# Patient Record
Sex: Female | Born: 1950
Health system: Southern US, Community
[De-identification: ages and names within clinical notes are randomized; demographics above are authoritative.]

## PROBLEM LIST (undated history)

## (undated) DIAGNOSIS — I429 Cardiomyopathy, unspecified: Secondary | ICD-10-CM

## (undated) DIAGNOSIS — I2510 Atherosclerotic heart disease of native coronary artery without angina pectoris: Secondary | ICD-10-CM

## (undated) DIAGNOSIS — I080 Rheumatic disorders of both mitral and aortic valves: Secondary | ICD-10-CM

## (undated) DIAGNOSIS — I5022 Chronic systolic (congestive) heart failure: Secondary | ICD-10-CM

## (undated) DIAGNOSIS — E785 Hyperlipidemia, unspecified: Secondary | ICD-10-CM

## (undated) DIAGNOSIS — I10 Essential (primary) hypertension: Secondary | ICD-10-CM

## (undated) DIAGNOSIS — I079 Rheumatic tricuspid valve disease, unspecified: Secondary | ICD-10-CM

## (undated) DIAGNOSIS — N186 End stage renal disease: Secondary | ICD-10-CM

## (undated) HISTORY — DX: Rheumatic disorders of both mitral and aortic valves: I08.0

## (undated) HISTORY — DX: Rheumatic tricuspid valve disease, unspecified: I07.9

## (undated) HISTORY — DX: Atherosclerotic heart disease of native coronary artery without angina pectoris: I25.10

## (undated) HISTORY — DX: Cardiomyopathy, unspecified: I42.9

## (undated) HISTORY — DX: Hyperlipidemia, unspecified: E78.5

## (undated) HISTORY — DX: End stage renal disease: N18.6

## (undated) HISTORY — DX: Chronic systolic (congestive) heart failure: I50.22

## (undated) HISTORY — DX: Essential (primary) hypertension: I10

---

## 2000-11-19 HISTORY — PX: KIDNEY TRANSPLANT: SHX239

## 2002-11-19 HISTORY — PX: AV FISTULA PLACEMENT: SHX1204

## 2005-08-03 ENCOUNTER — Ambulatory Visit: Payer: Self-pay | Admitting: Cardiology

## 2005-11-20 ENCOUNTER — Ambulatory Visit: Payer: Self-pay | Admitting: Cardiology

## 2005-11-22 ENCOUNTER — Inpatient Hospital Stay (HOSPITAL_COMMUNITY): Admission: AD | Admit: 2005-11-22 | Discharge: 2005-11-23 | Payer: Self-pay | Admitting: *Deleted

## 2005-11-22 ENCOUNTER — Ambulatory Visit: Payer: Self-pay | Admitting: Cardiology

## 2005-11-28 ENCOUNTER — Ambulatory Visit: Payer: Self-pay | Admitting: Cardiology

## 2006-05-31 ENCOUNTER — Encounter: Payer: Self-pay | Admitting: Physician Assistant

## 2006-05-31 ENCOUNTER — Ambulatory Visit: Payer: Self-pay | Admitting: Cardiology

## 2007-02-04 ENCOUNTER — Ambulatory Visit: Payer: Self-pay | Admitting: Cardiology

## 2008-05-17 ENCOUNTER — Encounter: Payer: Self-pay | Admitting: Physician Assistant

## 2008-06-18 ENCOUNTER — Ambulatory Visit: Payer: Self-pay | Admitting: Cardiology

## 2008-06-18 ENCOUNTER — Encounter: Payer: Self-pay | Admitting: Physician Assistant

## 2008-06-24 ENCOUNTER — Ambulatory Visit: Payer: Self-pay | Admitting: Cardiology

## 2008-08-12 ENCOUNTER — Ambulatory Visit: Payer: Self-pay | Admitting: Cardiology

## 2009-08-18 DIAGNOSIS — I429 Cardiomyopathy, unspecified: Secondary | ICD-10-CM | POA: Insufficient documentation

## 2009-08-18 DIAGNOSIS — I2510 Atherosclerotic heart disease of native coronary artery without angina pectoris: Secondary | ICD-10-CM

## 2009-08-18 DIAGNOSIS — I10 Essential (primary) hypertension: Secondary | ICD-10-CM

## 2009-08-18 DIAGNOSIS — I5022 Chronic systolic (congestive) heart failure: Secondary | ICD-10-CM | POA: Insufficient documentation

## 2009-08-18 DIAGNOSIS — E785 Hyperlipidemia, unspecified: Secondary | ICD-10-CM

## 2009-08-18 DIAGNOSIS — I080 Rheumatic disorders of both mitral and aortic valves: Secondary | ICD-10-CM

## 2009-08-18 DIAGNOSIS — I079 Rheumatic tricuspid valve disease, unspecified: Secondary | ICD-10-CM | POA: Insufficient documentation

## 2010-06-06 ENCOUNTER — Encounter: Payer: Self-pay | Admitting: Physician Assistant

## 2010-06-07 ENCOUNTER — Ambulatory Visit: Payer: Self-pay | Admitting: Cardiology

## 2010-06-08 ENCOUNTER — Encounter: Payer: Self-pay | Admitting: Physician Assistant

## 2010-06-29 ENCOUNTER — Encounter: Payer: Self-pay | Admitting: Physician Assistant

## 2010-07-26 ENCOUNTER — Telehealth (INDEPENDENT_AMBULATORY_CARE_PROVIDER_SITE_OTHER): Payer: Self-pay | Admitting: *Deleted

## 2010-07-26 ENCOUNTER — Ambulatory Visit: Payer: Self-pay | Admitting: Physician Assistant

## 2010-07-26 DIAGNOSIS — N186 End stage renal disease: Secondary | ICD-10-CM | POA: Insufficient documentation

## 2010-07-26 DIAGNOSIS — Z992 Dependence on renal dialysis: Secondary | ICD-10-CM

## 2010-08-28 ENCOUNTER — Ambulatory Visit: Payer: Self-pay | Admitting: Cardiology

## 2010-11-19 HISTORY — PX: AV FISTULA PLACEMENT: SHX1204

## 2010-12-19 NOTE — Letter (Signed)
Summary: [facility] D/C DR. DHRUV VYAS  [facility] D/C DR. DHRUV VYAS   Imported By: Delfino Lovett 06/29/2010 11:43:17  _____________________________________________________________________  External Attachment:    Type:   Image     Comment:   External Document

## 2010-12-19 NOTE — Progress Notes (Signed)
Summary: MEDLIST UPDATE  Phone Note Call from Patient Call back at Home Phone [phone]   Caller: Patient Call For: nurse Summary of Call: patient called to update medlist Initial call taken by: Georgina Peer,  July 26, 2010 3:35 PM    New/Updated Medications: RENAGEL 800 MG TABS (SEVELAMER HCL) take 3 by mouth with meals and 2 with snacks SIMVASTATIN 40 MG TABS (SIMVASTATIN) Take 1 tablet by mouth once a day

## 2010-12-19 NOTE — Consult Note (Signed)
Summary: DR. Jethro Poling [place]   Imported By: Delfino Lovett 06/29/2010 11:40:38  _____________________________________________________________________  External Attachment:    Type:   Image     Comment:   External Document

## 2010-12-19 NOTE — Assessment & Plan Note (Signed)
Summary: r/s eph that no showed   Visit Type:  Follow-up Primary Provider:  Woody Seller   History of Present Illness: patient presents for post hospital followup, following recent referral to our team here at [facility], for evaluation of chest pain.  She ruled out for myocardial infarction with normal MB/troponins (chronically elevated total CPKs), and was referred for a dobutamine stress echocardiogram, which was normal.  She denies any recurrent chest pain.  Patient has been on Coumadin since 2009, at which time she had question of a right atrial/left atrial appendage density; however, a repeat study later that year showed no evidence of thrombus. the current study also indicated no evidence of thrombus, but did indicate presence of an atrial septal aneurysm, but no PFO.  Preventive Screening-Counseling & Management  Alcohol-Tobacco     Smoking Status: never  Current Medications (verified): 1)  Labetalol Hcl 200 Mg Tabs (Labetalol Hcl) .... Take 2 Tablet By Mouth Two Times A Day 2)  Isosorbide Mononitrate Cr 30 Mg Xr24h-Tab (Isosorbide Mononitrate) .... Take 1 Tablet By Mouth Once A Day 3)  Sensipar 30 Mg Tabs (Cinacalcet Hcl) .... Take 1 Tablet By Mouth Once A Day 4)  Lexapro 20 Mg Tabs (Escitalopram Oxalate) .... Take 1 Tablet By Mouth Once A Day 5)  Hydralazine Hcl 10 Mg Tabs (Hydralazine Hcl) .... Take 1 Tablet By Mouth Once A Day 6)  Cozaar 50 Mg Tabs (Losartan Potassium) .... Take 1 Tablet By Mouth Once A Day 7)  Aspir-Low 81 Mg Tbec (Aspirin) .... Take 1 Tablet By Mouth Once A Day 8)  Renagel 800 Mg Tabs (Sevelamer Hcl) .... Use As Directed 9)  Morphine Sulfate 15 Mg Tabs (Morphine Sulfate) .... Take 1 Tablet By Mouth Once A Day 10)  Nephro-Vite 0.8 Mg Tabs (B Complex-C-Folic Acid) .... Take 1 Tablet By Mouth Once A Day  Allergies (verified): No Known Drug Allergies  Comments:  Nurse/Medical Assistant: The patient is currently on medications but does not know the name  or dosage at this time. Instructed to contact our office with details. Will update medication list at that time.  Past History:  Past Medical History: Last updated: 08/18/2009 TRICUSPID REGURGITATION, SEVERE (ICD-397.0) MITRAL REGURGITATION, MODERATE (ICD-396.3) HYPERTENSION, UNSPECIFIED (ICD-401.9) HYPERLIPIDEMIA-MIXED (ICD-272.4) CAD, NATIVE VESSEL ([id number]) SYSTOLIC HEART FAILURE, CHRONIC (ICD-428.22) CARDIOMYOPATHY, SECONDARY (ICD-425.9) End-stage renal disease  Review of Systems       No fevers, chills, hemoptysis, dysphagia, melena, hematocheezia, hematuria, rash, claudication, orthopnea, pnd, pedal edema. All other systems negative.   Vital Signs:  Patient profile:   60 year old female Height:      64 inches Weight:      156 pounds BMI:     26.87 Pulse rate:   90 / minute BP sitting:   118 / 84  (left arm) Cuff size:   regular  Vitals Entered By: Georgina Peer (July 26, 2010 2:02 PM)  Nutrition Counseling: Patient's BMI is greater than 25 and therefore counseled on weight management options.  Physical Exam  Additional Exam:  GEN: 60 year old female, sitting upright, no distress HEENT: NCAT,PERRLA,EOMI NECK: palpable pulses, no bruits; no JVD; no TM LUNGS: CTA bilaterally HEART: RRR (S1S2); no significant murmurs; no rubs; no gallops ABD: soft, NT; intact BS EXT: intact distal pulses; no edema SKIN: warm, dry MUSC: no obvious deformity NEURO: A/O (x3)     Impression & Recommendations:  Problem # 1:  CARDIOMYOPATHY, SECONDARY (ICD-425.9)  no further workup indicated. Patient had a recent normal dobutamine stress echocardiogram,  for evaluation of chest pain, in the setting of normal MBs/troponins. Of note, she has history of chronically elevated CPKs.  Problem # 2:  COUMADIN THERAPY (ICD-V58.61)  recommendation is to discontinue Coumadin anticoagulation, given the absence of any obvious thrombus, per recent echocardiogram. She had been on it since  2009, followed by Dr. Woody Seller. Of note, a followup echocardiogram later that same year indicated no obvious thrombus, as well.  Problem # 3:  END STAGE RENAL DISEASE (ICD-585.6)  on hemodialysis, followed by Dr. Lowanda Foster.  Problem # 4:  HYPERTENSION, UNSPECIFIED (ICD-401.9)  stable on current medication regimen.  Patient Instructions: 1)  Stop Coumadin. 2)  Your physician wants you to follow-up in: 1 year. You will receive a reminder letter in the mail one-two months in advance. If you don't receive a letter, please call our office to schedule the follow-up appointment.

## 2010-12-19 NOTE — Letter (Signed)
Summary: Appointment -missed  [facility] [place]. [address], [place] [address]   Phone: [phone]  Fax: [phone]     June 29, 2010 MRN: [id number]     [facility] [address] [place], [place]  [address]     Dear Ms. Tomasello,  Our records indicate you missed your appointment on June 29, 2010                        with [facility].   It is very important that we reach you to reschedule this appointment. We look forward to participating in your health care needs.   Please contact us at the number listed above at your earliest convenience to reschedule this appointment.   Sincerely,    Public relations account executive

## 2010-12-19 NOTE — Consult Note (Signed)
Summary: CARDIOLOGY CONSULT/ [facility]   Imported By: Delfino Lovett 06/29/2010 11:37:08  _____________________________________________________________________  External Attachment:    Type:   Image     Comment:   External Document

## 2011-02-20 ENCOUNTER — Ambulatory Visit (INDEPENDENT_AMBULATORY_CARE_PROVIDER_SITE_OTHER): Payer: Self-pay | Admitting: Internal Medicine

## 2011-04-03 NOTE — Assessment & Plan Note (Signed)
[facility]                          [facility]   NAME:Pryde, [name]                         MRN:          [id number]  DATE:06/18/2008                            DOB:          1951-04-05    CARDIOLOGIST:  Ernestine Mcmurray, MD,FACC   PRIMARY CARE PHYSICIAN:  Jerene Bears, MD   REASON FOR VISIT:  Abnormal echocardiogram.   HISTORY OF PRESENT ILLNESS:  [name] is a 60 year old female patient  with a history of nonischemic cardiomyopathy with an EF of about 30% as  well as  nonobstructive coronary artery disease by catheterization 2007  who presents to the office today for followup.  We last saw her in March  2008.  At that time, it was recommended that followup echocardiogram be  obtained.  The patient was to follow up 1 month later.  However, this  was never accomplished.  She did see Dr. Woody Seller in follow up in April 2009  and had an echocardiogram performed in their office.   Echocardiogram performed on March 15, 2008, revealed an EF of 20%,  biatrial enlargement, moderately severe tricuspid regurgitation with an  estimated right ventricular systolic pressure of 50 mmHg, moderate  mitral regurgitation and a question of an echo density in the right  atrium and left atrial appendage that was not optimally visualized.  It  was felt that the patient possibly had a clot and she was placed on  Coumadin therapy at that time.  Followup was requested and the patient  returns for follow up today.   The patient denies any history of chest pain.  She denies syncope, near-  syncope, or palpitations.  She notes NYHA class I symptoms.  She denies  orthopnea or PND.  She denies pedal edema.   CURRENT MEDICATIONS:  1. Labetalol 200 mg two tabs b.i.d.  2. Dialyvite.  3. Isosorbide 30 mg daily.  4. Sensipar 30 mg day.  5. Lipitor 20 mg daily.  6. Hydralazine 10 mg three times a day.  7. Cozaar 50 mg daily.  8. Aspirin 81 mg daily.  9. Renagel 80  mg.  10.Warfarin as directed by Dr. Woody Seller.  11.NephPlex daily.   ALLERGIES:  No known drug allergies.   PHYSICAL EXAMINATION:  GENERAL:  She is a well-nourished, well-developed  female.  VITAL SIGNS:  Blood pressure is 136/74, pulse 72, and weight 144 pounds.  HEENT:  Normal without JVD.  CARDIAC:  Normal S1 and S2.  Regular rate and rhythm with a [id number]  systolic ejection murmur heard best along the left sternal border.  LUNGS:  Clear to auscultation bilaterally without wheezing, rhonchi, or  rales.  ABDOMEN:  Soft, nontender.  EXTREMITIES:  Without edema.  NEUROLOGIC:  She is alert and oriented x3.  Cranial nerves II through  XII are grossly intact.   Electrocardiogram reveals sinus rhythm with a heart rate of 69, LVH with  repolarization abnormality.   IMPRESSION:  1. Nonischemic cardiomyopathy with an ejection fraction of 20-30%.  2. Chronic systolic congestive heart failure with a New York Heart  Association class I symptoms.  3. Nonobstructive coronary disease by catheterization in 2007.      a.     First diagonal branch ostial 50%, mid left anterior       descending 20%.  4. Questionable right atrial and left atrial appendage clot by 2D      echocardiogram done at [facility] - Coumadin therapy      initiated by Dr. Woody Seller.  5. Moderate mitral regurgitation.  6. Moderately severe tricuspid regurgitation.  7. End-stage renal disease on hemodialysis.      a.     The patient is being evaluated for transplantation at [facility].  8. Dyslipidemia.  9. Hypertension.  10.Pulmonary hypertension.   PLAN:  1. [name] returns for a followup after echocardiography revealed      potential clot in her right atrium and left atrial appendage.  She      has been placed on Coumadin therapy for this.  It has been      approximately 3 months since this study was performed.  At this      point in time, she requires followup echocardiography  for      reassessment.  I discussed this with Dr. Domenic Polite and he agreed.      If there is still a question on repeat transthoracic      echocardiography, she will require transesophageal echocardiography      to further assess.  2. Continued follow up for her Coumadin therapy will be per her      primary care physician.  3. The patient continues to demonstrate an EF less than 35%.  She has      a nonischemic cardiomyopathy.  We will eventually refer her to Dr.      Caryl Comes for consideration of ICD therapy.  We will hold off on making      this referral until we know the results of her repeat      echocardiogram.  4. The patient will be brought back in 4 weeks for followup on the      above testing and further recommendations.      Richardson Dopp, PA-C  Electronically Signed      Satira Sark, MD  Electronically Signed   SW/MedQ  DD: 06/18/2008  DT: 06/19/2008  Job #: [id number]   cc:   Alison Murray, M.D.  Jerene Bears, MD

## 2011-04-03 NOTE — Assessment & Plan Note (Signed)
[facility]                          [facility]   NAME:[name]                         MRN:          [id number]  DATE:08/12/2008                            DOB:          Oct 27, 1951    HISTORY OF PRESENT ILLNESS:  The patient is a 60 year old female with  nonischemic cardiomyopathy with the most recent ejection fraction of 20-  25%.  The patient has end-stage renal disease and is on hemodialysis.  She has had several shunt failures and is now being dialyzed through a  Vas Cath in the right subclavicular area.  She had a recent infection  with Staphylococcus lugdunensis for which she is still receiving  antibiotics therapy.  The echo was obtained with a followup  consideration for ICD implant.   She is having chest pain, but not short of breath.  Just feels shortly  after she just finished the day from hemodialysis.  She has, however, no  cardiovascular complaints.   She still is being followed by [facility] for possible kidney transplant.   PHYSICAL EXAMINATION:  VITAL SIGNS:  Blood pressure 165/95, heart rate  82, and weight 144 pounds.  NECK:  Reveal normal carotid upstroke and no carotid bruits.  LUNGS:  Clear breath sounds bilaterally.  HEART:  Regular rate and rhythm, normal S1 and S2.  No murmur, rub, or  gallop.  ABDOMEN:  Soft and nontender. No rebound or guarding.  Good bowel  sounds.  EXTREMITIES:  No cyanosis, clubbing or edema.  NEURO:  The patient is alert, oriented, and grossly normal.   MEDICATIONS:  1. Labetalol 200 mg 2 times p.o. b.i.d.  2. Isosorbide 30 mg p.o. daily.  3. Sensipar 30 mg p.o. daily.  4. Lexapro 20 mg p.o. daily.  5. Hydralazine 10 mg p.o. daily.  6. Cozaar 50 mg p.o. daily.  7. Aspirin 81 mg p.o. daily.  8. Renagel.  9. Morphine 5 mg p.o. daily.  10.Nephro__________ p.o. daily.   PROBLEMS:  1. Nonischemic cardiomyopathy with ejection fraction of 20-25%.  2. Systolic congestive heart  failure with NYHA class 1 symptoms.  3. Coronary artery disease.  4. History of right atrial and left atrial appendage thrombus, on      Coumadin.  5. Moderate mitral regurgitation.  6. Moderate severe tricuspid regurgitation.  7. End-stage renal disease, on hemodialysis.  8. Dyslipidemia.  9. Hypertension.   PLAN:  1. The patient really has had an infection with Staphylococcus      lugdunensis with gentamicin dialysis.  2. Given the fact that the patient has now a permanent Vas Cath in the      right subclavicular area, I do not think that she is a good      candidate for ICD implant as I think there is a high likelihood of      infection.  Furthermore, she has  nonischemic cardiomyopathy with      an NSTEMI, NYHA class 1, and the benefit may be marginal for ICD      implant.  3. We will follow up with the patient and  she will be continued to be      evaluated for renal transplant at [place]  Electronically Signed    GED/MedQ  DD: 08/12/2008  DT: 08/13/2008  Job #: [id number]

## 2011-04-06 NOTE — Cardiovascular Report (Signed)
[name], HRABOVSKY                  ACCOUNT NO.:  [id number]   MEDICAL RECORD NO.:  [id number]          PATIENT TYPE:  INP   LOCATION:  6525                         FACILITY:  [facility]   PHYSICIAN:  Ethelle Lyon, M.D. LHCDATE OF BIRTH:  21-Jun-1951   DATE OF PROCEDURE:  DATE OF DISCHARGE:                              CARDIAC CATHETERIZATION   Audio too short to transcribe (less than 5 seconds)      Ethelle Lyon, M.D. [facility]     WED/MEDQ  D:  11/22/2005  T:  11/22/2005  Job:  [id number]

## 2011-04-06 NOTE — Assessment & Plan Note (Signed)
[place] OFFICE NOTE   NAME:[name], [name]                         MRN:          [id number]  DATE:05/31/2006                            DOB:          23-Nov-1950    PRIMARY CARDIOLOGIST:  Ernestine Mcmurray, MD, [facility]   REASON FOR OFFICE VISIT:  Ms. Lovvorn presents for scheduled six month  followup here.   Patient is a 60 year old female with severe nonischemic cardiomyopathy,  status post cardiac catheterization in January of this year revealing  nonobstructive coronary artery disease and severe LVD (EF 30%) with global  hypokinesis.  Notably, there was no noted mitral regurgitation by  catheterization; however, by echocardiogram, patient had moderate MR  secondary to mitral annular dilatation versus papillary muscle dysfunction  (EF 25-30%) with global hypokinesis.   When last seen here in the clinic, our recommendations were to further up-  titrate medications with lisinopril to 20 daily and hydralazine to 20  t.i.d.; however, patient reports today that she was not given prescriptions  for these.   Regardless, patient reports having recently been taken off lisinopril  approximately one month ago secondary to complaint of cough.  She was placed  on Cozaar, and her cough has improved, although not yet resolved.   Symptomatically, patient continues to report shortness of breath, two pillow  orthopnea, and occasional PND as well as lower extremity edema; however, she  reports compliance with her medications and also refrains from any added  salt in her diet.   Patient does have end-stage renal disease and is on hemodialysis  (Tuesday/Thursday/Saturday).  She also reports close followup by Dr.  Lowanda Foster, who has been adjusting her blood pressure regimen.   CURRENT MEDICATIONS:  1.  Labetalol 400 mg b.i.d.  2.  Renagel 800 mg 4 tablets with meals.  3.  Dialyvite with zinc daily.  4.  Isosorbide 30 mg  daily.  5.  Sensipar 30 mg daily.  6.  Lipitor 20 mg daily.  7.  Hydralazine 10 mg t.i.d.  8.  Cozaar 50 mg daily.   PHYSICAL EXAMINATION:  VITAL SIGNS:  Blood pressure 150/90, pulse 66,  regular.  Weight 154 (down 4 pounds).  GENERAL:  A 60 year old female, sitting upright, in no apparent distress.  NECK:  Palpable carotid pulses with jugular venous pulsations noted at 90  degrees.  LUNGS:  Clear to auscultation bilaterally.  HEART:  Regular rate and rhythm.  (S1 and S2).  A harsh 3/6 holosystolic  murmur from the upper LSB to the left preaxillary line.  EXTREMITIES:  Palpable peripheral pulses with trace pedal edema.  NEUROLOGIC:  Flat affect but with no focal deficit.   IMPRESSION:  1.  Nonischemic cardiomyopathy.      1.  Nonobstructive coronary artery disease, ejection fraction 30% by          catheterization, January, 2007.  2.  Mitral regurgitation.      1.  Moderate by echocardiogram, January, 2007.  3.  Hypertension.  4.  End-stage renal disease.      1.  On  hemodialysis.  5.  Hyperlipidemia.   PLAN:  1.  Scheduled 2D echocardiogram for reassessment of LV function and severity      of mitral regurgitation.  If patient continues to have depressed left      ventricular function, then we will need to consider further aggressive      intervention (i.e., ICD implantation).  2.  Up-titrate hydralazine to 20 mg t.i.d. for better blood pressure      control.  3.  Schedule return visit to the clinic with Dr. Terald Sleeper in approximately      six months.                                   Gene Serpe, PA-C                                Ernestine Mcmurray, MD, [facility]   GS/MedQ  DD:  05/31/2006  DT:  05/31/2006  Job #:  [id number]   cc:   Alison Murray, MD

## 2011-04-06 NOTE — Cardiovascular Report (Signed)
NAMEANAM, [name]                  ACCOUNT NO.:  [id number]   MEDICAL RECORD NO.:  [id number]          PATIENT TYPE:  INP   LOCATION:  6525                         FACILITY:  [facility]   PHYSICIAN:  Ethelle Lyon, M.D. LHCDATE OF BIRTH:  [birth year]   DATE OF PROCEDURE:  DATE OF DISCHARGE:                              CARDIAC CATHETERIZATION   Audio too short to transcribe (less than 5 seconds)      Ethelle Lyon, M.D. [facility]     WED/MEDQ  D:  11/22/2005  T:  11/22/2005  Job:  [id number]

## 2011-04-06 NOTE — Cardiovascular Report (Signed)
NAMESHELANDA, WEASE                  ACCOUNT NO.:  [id number]   MEDICAL RECORD NO.:  [id number]          PATIENT TYPE:  INP   LOCATION:  6525                         FACILITY:  [place]   PHYSICIAN:  Ethelle Lyon, M.D. LHCDATE OF BIRTH:  Dec 22, 1950   DATE OF PROCEDURE:  11/22/2005  DATE OF DISCHARGE:                              CARDIAC CATHETERIZATION   PROCEDURE:  Left heart catheterization, left ventriculography, coronary  angiography.   INDICATIONS:  [name] is a 60 year old woman with end-stage renal disease  on chronic hemodialysis. She presented yesterday with congestive heart  failure despite compliance with her dialysis regimen and medications.  Troponins were mildly elevated 0.22. She was referred for diagnostic  angiography to exclude coronary disease as the etiology for heart failure.   PROCEDURE TECHNIQUE:  Informed consent was obtained. Under 1% lidocaine  local anesthesia, a 5-French sheath was placed in the right common femoral  artery using the modified Seldinger technique. Diagnostic angiography and  ventriculography were performed using JL-4, JR-4 and pigtail catheters. The  patient tolerated the procedure well and was transferred to the holding room  in stable condition. Sheath will be removed there.   COMPLICATIONS:  None.   FINDINGS:  1.  LV: 114/8/19. EF 30% with global hypokinesis.  2.  No aortic stenosis or mitral regurgitation.  3.  Left main:  There is some moderate calcification with mild plaquing but      no significant stenosis.  4.  LAD:  Relatively large vessel which wraps the apex of the heart and      gives rise to three diagonals. The first diagonal is an ostial 50%      stenosis. The mid-LAD has a 20% stenosis.  5.  Circumflex:  Relatively small vessel giving rise to a single branching      obtuse marginal. It is angiographically normal.  6.  RCA:  Relatively large dominant vessel. It is angiographically normal.   IMPRESSION/PLAN:  Ms.  [name] has a nonischemic cardiomyopathy, probably due  to her hypertension. She also has some superimposed mild coronary disease  which we will manage medically. We will use aspirin. We will discontinue  Norvasc in favor of lisinopril 10 milligrams per day. Continue labetalol,  hydralazine. I have spoken with Dr. Lowanda Foster who is making arrangements for  her to receive outpatient dialysis tomorrow at 10 a.m. in [place].      Ethelle Lyon, M.D. [facility]  Electronically Signed     WED/MEDQ  D:  11/22/2005  T:  11/22/2005  Job:  [id number]   cc:   Ernestine Mcmurray, M.D. [facility]  [address]  [address]  [place]  [place] [address]   Zollie Beckers, M.D.   Alison Murray, M.D.  Fax: [phone]

## 2011-04-06 NOTE — Assessment & Plan Note (Signed)
[facility]                          [facility] OFFICE NOTE   NAME:[name], [name]                         MRN:          [id number]  DATE:02/04/2007                            DOB:          Nov 24, 1950    PRIMARY CARDIOLOGIST:  Ernestine Mcmurray, MD, [facility]   REASON FOR VISIT:  Six month followup.   Since last seen here in the clinic by me in July 2007, he reports having  been briefly hospitalized at [facility] in late February of this  year, reportedly for treatment of infected catheter site. I do not have  any of these hospital records but, according to the patient, she was  discharged with recommendation to continue intravenous antibiotics for  an additional week. These apparently were administered during her  scheduled hemodialysis sessions and have since been completed.   The patient has also had followup with Dr. Lowanda Foster and no recent  medication adjustments were made.   Clinically, the patient states that she was not diagnosed with  congestive heart failure during this past hospitalization; she denies  any orthopnea, PND, lower extremity edema, chest pain or tachy  palpitations. Of note, she has lost 11 pounds since her last visit and  she states that she has had diminished appetite.   CURRENT MEDICATIONS:  1. Labetalol 400 b.i.d.  2. Renagel as directed.  3. Dialyvite.  4. Isosorbide 30 daily.  5. Sensipar 30 daily.  6. Lipitor 20 daily.  7. Hydralazine 20 t.i.d.  8. Cozaar 50 daily.   PHYSICAL EXAMINATION:  Blood pressure 132/72, pulse 60 and regular,  weight is 147 (down 11 pounds).  In general, a 60 year old female, sitting upright in no distress.  HEENT: Normocephalic, atraumatic.  NECK: Palpable bilateral carotid pulses without bruits. Jugular venous  distention noted at 90-degrees.  LUNGS:  Clear to auscultation in all fields.  HEART: Regular rate and rhythm (S1, S2). A harsh grade 3/6 holosystolic  murmur heard loudest  at the apex.  ABDOMEN: Soft, nontender.  EXTREMITIES: Palpable pulses with no significant edema.  NEURO: No focal deficit.   IMPRESSION:  1. Non-ischemic cardiomyopathy.      a.     EF of 30% by catheterization in January of 2007.      b.     Question Takasubo cardiomyopathy by 2D echo January [id number].  2. Systolic heart failure.      a.     New York Heart Association Class I.  3. Valvular heart disease.      a.     Moderate MR secondary to mitral anular dilatation (question       papillary muscle dysfunction).  4. End-stage renal disease.      a.     Hemodialysis, followed by Dr. Lowanda Foster.  5. Moderate pulmonary hypertension.  6. Hyperlipidemia.  7. Hypertension.   PLAN:  1. As previously recommended, the patient will need a repeat 2D echo      for reassessment of LV function and severity of mitral      regurgitation. As we had noted at time of her last  clinic visit,      the patient may need to be considered for further aggressive      intervention (ie-ICD implantation) if she has persistent depressed      left ventricular function. We will therefore reassess this when she      returns for clinic followup in one month for review of the echo      results and further recommendations.  2. The patient advised to start taking low-dose aspirin for primary      prevention.      Gene Serpe, PA-C  Electronically Signed      Ernestine Mcmurray, MD,FACC  Electronically Signed   GS/MedQ  DD: 02/04/2007  DT: 02/04/2007  Job #: [id number]   cc:   Alison Murray, M.D.

## 2011-04-06 NOTE — Discharge Summary (Signed)
[name], [name]                  ACCOUNT NO.:  [id number]   MEDICAL RECORD NO.:  [id number]          PATIENT TYPE:  INP   LOCATION:  [id number]                         FACILITY:  [facility]   PHYSICIAN:  Ethelle Lyon, M.D. LHCDATE OF BIRTH:  March 01, 1951   DATE OF ADMISSION:  11/22/2005  DATE OF DISCHARGE:  11/23/2005                                 DISCHARGE SUMMARY   PRIMARY CARDIOLOGIST:  Ernestine Mcmurray, M.D. [facility], [place], [place].   PRIMARY CARE PHYSICIAN:  Langley Gauss, M.D., [place], [place].   PRINCIPAL DIAGNOSIS:  Hypertensive emergency/urgency.   SECONDARY DIAGNOSES:  1.  Congestive heart failure.  2.  Nonischemic cardiomyopathy with ejection fraction of 30%.  3.  Nonobstructive coronary artery disease.  4.  Poorly controlled hypertension.  5.  End-stage renal disease on Monday, Wednesday, Friday dialysis since      1998.  6.  Status post failed renal transplantation.  7.  Hyperlipidemia.   ALLERGIES:  NO KNOWN DRUG ALLERGIES.   PROCEDURE:  Left heart cardiac catheterization.   HISTORY OF PRESENT ILLNESS:  The patient is a 60 year old African American  female with prior history of hypertension, hypertensive nephropathy, and end-  stage renal disease on hemodialysis since 1998 who presented to the [facility] emergency room on November 20, 2005 with complaints of  nonproductive cough, progressive over the past few weeks, with acute onset  of shortness of breath and orthopnea.  In the emergency room, she was noted  to have a blood pressure of 211/122 with an oxygen saturation of  approximately 80% on room air.  Chest x-ray showed small bilateral pleural  effusions with venous congestion and interstitial edema.  She was initiated  on Lasix and Rocephin therapy, and renal was consulted for urgent  hemodialysis.  Cardiology was consulted secondary to elevated CK-MB of 12.6  and troponin I of 0.22.  EKG showed sinus tachycardia with nonspecific ST  changes.  Her white count was noted to be elevated at 17,300.  Decision was  made to transfer her to [facility] for further evaluation and cardiac  catheterization.   HOSPITAL COURSE:  The patient underwent left heart cardiac catheterization  on November 22, 2005 revealing an EF of 30% with global hypokinesis, normal  left main, a 20% stenosis in the mid-LAD, a 50% lesion in the proximal V1, a  normal left circumflex, and normal right coronary artery.  We saw marked  improvement in blood pressures with the addition of hydralazine and nitrate  along with lisinopril therapy.  This morning, she has been ambulating  without any recurrent dyspnea or limitation.  She is being discharged home  today and has scheduled dialysis this morning in [place].   DISCHARGE LABORATORY DATA:  Hemoglobin 9.9, hematocrit 28.0, WBC 9.5,  platelets 318, MCV 94.6.  Sodium 132, potassium 4.5, chloride 96, CO2 23,  BUN 46, creatinine 16.0, glucose 92, calcium 8.5.   DISPOSITION:  The patient is being discharged home today in good condition.   FOLLOW UP:  The patient has a followup appointment scheduled with Dr. Dannielle Burn  on November 28, 2005 at 1:45 p.m.  She will continue on Monday, Wednesday,  Friday dialysis and is also asked to follow up with her primary care  physician, Dr. Ronne Binning in [place], within the next three or four weeks.   DISCHARGE MEDICATIONS:  1.  Lisinopril 10 mg daily.  2.  Labetalol 400 mg b.i.d.  3.  Hydralazine 10 mg t.i.d.  4.  Isosorbide mononitrate 30 mg daily.  5.  Lipitor 20 mg daily.  6.  Aspirin 81 mg daily.  7.  Multivitamin plus zinc daily.  8.  Renagel 800 mg t.i.d. q.a.c. and two tablets with snacks.  9.  Sensipar 30 mg t.i.d. q.a.c.  10. Zithromax 250 mg daily x5 days.   OUTSTANDING LAB STUDIES:  None.   DURATION OF DISCHARGE ENCOUNTER:  35 minutes, including physician time.      Rogelia Mire, NP      Ethelle Lyon, M.D. [facility]  Electronically Signed     CRB/MEDQ  D:  11/23/2005  T:  11/23/2005  Job:  [id number]   cc:   Langley Gauss, M.D.   Ernestine Mcmurray, M.D. [facility]  [address] [place]  [place] [address]

## 2011-04-11 ENCOUNTER — Ambulatory Visit (INDEPENDENT_AMBULATORY_CARE_PROVIDER_SITE_OTHER): Payer: BC Managed Care – PPO | Admitting: Internal Medicine

## 2011-04-11 DIAGNOSIS — Z7982 Long term (current) use of aspirin: Secondary | ICD-10-CM

## 2011-04-11 DIAGNOSIS — R634 Abnormal weight loss: Secondary | ICD-10-CM

## 2011-04-30 NOTE — Consult Note (Signed)
  NAMETERRENA, GARCED                  ACCOUNT NO.:  [id number]  MEDICAL RECORD NO.:  [id number]           PATIENT TYPE:  LOCATION:                                 FACILITY:  PHYSICIAN:  Hildred Laser, M.D.    DATE OF BIRTH:  [birth year]  DATE OF CONSULTATION: DATE OF DISCHARGE:                                CONSULTATION   ADDENDUM  She was seen on Apr 11, 2011.  Her last colonoscopy was on February 03, 1998, which revealed diverticulosis of the colon.  This procedure was done at [facility].    ______________________________ Deberah Castle, NP   ______________________________ Hildred Laser, M.D.    TS/MEDQ  D:  04/11/2011  T:  04/12/2011  Job:  [id number]  Electronically Signed by Deberah Castle PA on 04/19/2011 01:48:53 PM Electronically Signed by Hildred Laser M.D. on 04/30/2011 02:38:54 PM

## 2011-04-30 NOTE — Consult Note (Signed)
Brittany, [name]                  ACCOUNT NO.:  [id number]  MEDICAL RECORD NO.:  [id number]           PATIENT TYPE:  LOCATION:                                 FACILITY:  PHYSICIAN:  Hildred Laser, M.D.    DATE OF BIRTH:  1951/11/18  DATE OF CONSULTATION:  04/11/2011 DATE OF DISCHARGE:                                CONSULTATION   REFERRING PHYSICIAN:  Alison Murray, MD  REASON FOR CONSULTATION:  Referred by Dr. Lowanda Foster for a screening colonoscopy as part of her workup for a kidney transplant.  HISTORY OF PRESENT ILLNESS:  [name] is a 60 year old female presenting today as a referral from Dr. Lowanda Foster.  She is planning to have another kidney transplant.  She underwent a left kidney transplant in 2000.  She received a kidney from one of her sons.  The kidney failed in 2002 due to rejection.  As part of her workup for the transplant there, Dr. Lowanda Foster is requesting a screening colonoscopy.  She says her appetite has been okay.  She gives a history of having a weight loss up to 25 pounds over 2-3 years.  She denies any abdominal pain.  She usually has 1-2 bowel movements a day and they are normal.  They are brown in color.  She denies any melena or bright red rectal bleeding. She says her last colonoscopy was less than 5 years ago at [facility] and it was normal and I will try to get those records.  She does take dialysis on Tuesdays, Thursdays, and Saturdays. There are no known allergies.  SURGERIES:  She had a tubal ligation in 1981.  She has a dialysis port to her right arm.  She previously had a dialysis port in the left arm. She had a left kidney transplant in 2000, her kidneys failed in 2002. The kidney was donated by one of her sons.  The kidney transplant was performed by Dr. Nolon Lennert at [facility] There is a family history of polycystic kidney disease.  MEDICAL HISTORY:  Includes hypertension, high cholesterol,  renal failure, polycystic kidney disease.  She has mitral valve regurgitation. She underwent a 2D echo in 2009, which showed an ejection fraction of 20%.  She has nonischemic cardiomyopathy.  FAMILY HISTORY:  Mother deceased from kidney failure.  Father deceased from kidney failure, CVA, and he was on dialysis.  Two sisters are deceased both from kidney failure and CVA.  Four brothers, one is alive with hypertension, one deceased from a car accident.  One was accidentally shot and one died from kidney failure and there is a family history of polycystic kidney disease.  SOCIAL HISTORY:  She is separated.  She is unemployed.  She does not smoke, drink, or do drugs.  She has 3 children, one son who was presently on dialysis, one son has recently been diagnosed with diabetes, he also gave his mother a kidney, and one is in good health.  HOME MEDICATIONS: 1. Aspirin 81 mg a day. 2. Isosorbide 30 mg a day. 3. Nephplex 2 tabs daily. 4. Renvela 800 mg  3 tabs with meals and 2 with snacks. 5. Sensipar 30 mg 1 with every meal. 6. Simvastatin 40 mg a day. 7. Tylenol over the counter.  The patient did bring a list with her and I have reviewed this list. She denies taking any other medicine.  SUBJECTIVE:  VITAL SIGNS:  Her weight is 156.5, height 5 feet 4 inches, temperature 97.8, blood pressure 124/72, pulse 76. HEENT:  She has natural teeth.  Her oral mucosa is moist.  Her conjunctivae are pink.  Her sclerae are anicteric. NECK:  Her thyroid is normal.  There is no cervical lymphadenopathy. LUNGS:  Clear. HEART:  Regular rate and rhythm. ABDOMEN:  Soft, obese.  No masses.  She does have a scar to her left side of her abdomen, very long from the transplant.  Her stool was brown guaiac negative today.  ASSESSMENT:  [name] is a 60 year old female with chronic renal failure.  She is presently awaiting a kidney transplant.  As part of a workup for her transplant, Dr. Lowanda Foster is  requesting a screening colonoscopy.  She has had an unintentional weight loss of approximately 25 pounds over the last 2-3 years.  A colon neoplasm also needs to be ruled out.  RECOMMENDATIONS:  We will schedule a colonoscopy  in the near future.   The risk and benefits were reviewed with her and she is agreeable.    ______________________________ Deberah Castle, NP   ______________________________ Hildred Laser, M.D.    TS/MEDQ  D:  04/11/2011  T:  04/11/2011  Job:  [id number]  cc:   Alison Murray, M.D. Fax: [phone]  Electronically Signed by Deberah Castle PA on 04/13/2011 09:49:18 AM Electronically Signed by Hildred Laser M.D. on 04/30/2011 02:38:42 PM

## 2011-05-16 ENCOUNTER — Ambulatory Visit (HOSPITAL_COMMUNITY)
Admission: RE | Admit: 2011-05-16 | Discharge: 2011-05-16 | Disposition: A | Discharge status: Home / Self Care | Payer: BC Managed Care – PPO | Source: Ambulatory Visit | Attending: Internal Medicine | Admitting: Internal Medicine

## 2011-05-16 ENCOUNTER — Encounter (HOSPITAL_BASED_OUTPATIENT_CLINIC_OR_DEPARTMENT_OTHER): Payer: PRIVATE HEALTH INSURANCE | Admitting: Internal Medicine

## 2011-05-16 DIAGNOSIS — Z1211 Encounter for screening for malignant neoplasm of colon: Secondary | ICD-10-CM

## 2011-05-16 DIAGNOSIS — N186 End stage renal disease: Secondary | ICD-10-CM | POA: Insufficient documentation

## 2011-05-16 DIAGNOSIS — K648 Other hemorrhoids: Secondary | ICD-10-CM | POA: Insufficient documentation

## 2011-05-16 DIAGNOSIS — K644 Residual hemorrhoidal skin tags: Secondary | ICD-10-CM

## 2011-05-16 DIAGNOSIS — Z9689 Presence of other specified functional implants: Secondary | ICD-10-CM | POA: Insufficient documentation

## 2011-05-16 DIAGNOSIS — K5730 Diverticulosis of large intestine without perforation or abscess without bleeding: Secondary | ICD-10-CM | POA: Insufficient documentation

## 2011-05-29 NOTE — Op Note (Signed)
  Brittany, [name]                  ACCOUNT NO.:  [id number]  MEDICAL RECORD NO.:  [id number]  LOCATION:  [facility]                          FACILITY:  [facility]  PHYSICIAN:  Hildred Laser, M.D.    DATE OF BIRTH:  1951/06/01  DATE OF PROCEDURE: DATE OF DISCHARGE:                              OPERATIVE REPORT   PROCEDURE:  Colonoscopy.  INDICATION:  [name] is 60 year old African American female who is undergoing average risk screening colonoscopy.  She is waiting for renal transplant and her last colonoscopy was over 10 years ago.  Family history is negative for colorectal carcinoma.  Procedures were reviewed with the patient and informed consent was obtained.  MEDS FOR CONSCIOUS SEDATION: 1. Demerol 25 mg IV. 2. Versed 5 mg IV.  FINDINGS:  Procedure performed in endoscopy suite.  The patient's vital signs and O2 sat were monitored during the procedure and remained stable.  The patient was placed left lateral recumbent position.  Rectal examination performed.  No abnormality noted on external or digital exam.  Pentax videoscope was placed in rectum and advanced under vision into sigmoid colon and beyond.  She had multiple diverticula primarily at sigmoid and descending colon and few more scattered in rest of her colon.  Scope was passed into cecum which was identified by appendiceal orifice and ileocecal valve.  Pictures taken for the record.  As the scope was withdrawn, colonic mucosa was carefully examined and no polyps and/or tumor masses were noted.  Rectal mucosa similarly was normal. Scope was retroflexed to examine anorectal junction and small hemorrhoids noted below the dentate line.  Endoscope was withdrawn. Withdrawal time was 9 minutes.  The patient tolerated the procedure well.  FINAL DIAGNOSES: 1. Examination performed to cecum. 2. Pancolonic diverticulosis.  She has moderate number of diverticula     at sigmoid and descending colon and few more proximal splenic  flexure. 3. Small external hemorrhoids.  RECOMMENDATIONS: 1. Standard instructions given. 2. High-fiber diet.  She may consider next screening exam in 10 years.          ______________________________ Hildred Laser, M.D.     NR/MEDQ  D:  05/16/2011  T:  05/17/2011  Job:  [id number]  cc:   Alison Murray, M.D. [phone]  Dr. Woody Seller  Electronically Signed by Hildred Laser M.D. on 05/29/2011 09:50:48 PM

## 2011-10-17 DIAGNOSIS — D649 Anemia, unspecified: Secondary | ICD-10-CM | POA: Insufficient documentation

## 2011-10-17 DIAGNOSIS — Q613 Polycystic kidney, unspecified: Secondary | ICD-10-CM | POA: Insufficient documentation

## 2011-11-26 ENCOUNTER — Ambulatory Visit: Payer: Self-pay | Admitting: Vascular Surgery

## 2011-11-28 ENCOUNTER — Ambulatory Visit: Payer: Self-pay | Admitting: Vascular Surgery

## 2011-12-27 ENCOUNTER — Telehealth (INDEPENDENT_AMBULATORY_CARE_PROVIDER_SITE_OTHER): Payer: Self-pay | Admitting: *Deleted

## 2011-12-27 NOTE — Telephone Encounter (Signed)
Andreanna from [facility] needs a copy of Sharde's TCS. The return phone number is [phone] and fax number is [phone].

## 2011-12-27 NOTE — Telephone Encounter (Signed)
Note faxed to [facility]

## 2012-01-08 ENCOUNTER — Telehealth: Payer: Self-pay | Admitting: Cardiology

## 2012-01-08 NOTE — Telephone Encounter (Signed)
06/20/11 & 10/17/11  mailed reminder to schedule f/u

## 2012-01-30 ENCOUNTER — Ambulatory Visit: Payer: Self-pay | Admitting: Vascular Surgery

## 2012-02-16 ENCOUNTER — Emergency Department: Payer: Self-pay | Admitting: Emergency Medicine

## 2012-05-05 ENCOUNTER — Ambulatory Visit: Payer: Self-pay | Admitting: Vascular Surgery

## 2012-05-20 ENCOUNTER — Encounter: Payer: Self-pay | Admitting: Cardiology

## 2012-07-28 ENCOUNTER — Ambulatory Visit: Payer: Self-pay | Admitting: Vascular Surgery

## 2012-09-15 ENCOUNTER — Ambulatory Visit: Payer: Self-pay | Admitting: Vascular Surgery

## 2012-11-03 ENCOUNTER — Ambulatory Visit: Payer: Self-pay | Admitting: Vascular Surgery

## 2012-12-15 ENCOUNTER — Ambulatory Visit: Payer: Self-pay | Admitting: Vascular Surgery

## 2012-12-24 ENCOUNTER — Ambulatory Visit: Payer: Self-pay | Admitting: Vascular Surgery

## 2013-01-06 ENCOUNTER — Ambulatory Visit: Payer: Self-pay | Admitting: Vascular Surgery

## 2013-01-07 ENCOUNTER — Encounter (HOSPITAL_COMMUNITY): Payer: Self-pay | Admitting: *Deleted

## 2013-01-07 ENCOUNTER — Encounter (HOSPITAL_COMMUNITY): Payer: Self-pay | Admitting: Pharmacist

## 2013-01-07 ENCOUNTER — Other Ambulatory Visit: Payer: Self-pay

## 2013-01-07 NOTE — Progress Notes (Signed)
No Answer, I left instructions on voice mail , arrive at 0830, NPO after mn.  Meds with a sip of water Labetolol, Tylenol if needed.  Phone number to SS.

## 2013-01-08 ENCOUNTER — Ambulatory Visit (HOSPITAL_COMMUNITY)
Admission: RE | Admit: 2013-01-08 | Discharge: 2013-01-08 | Disposition: A | Discharge status: Home / Self Care | Payer: Medicare Other | Source: Ambulatory Visit | Attending: Vascular Surgery | Admitting: Vascular Surgery

## 2013-01-08 ENCOUNTER — Ambulatory Visit (HOSPITAL_COMMUNITY): Payer: Medicare Other | Admitting: Anesthesiology

## 2013-01-08 ENCOUNTER — Encounter (HOSPITAL_COMMUNITY): Payer: Self-pay | Admitting: *Deleted

## 2013-01-08 ENCOUNTER — Encounter (HOSPITAL_COMMUNITY): Payer: Self-pay | Admitting: Anesthesiology

## 2013-01-08 ENCOUNTER — Encounter (HOSPITAL_COMMUNITY)
Admission: RE | Disposition: A | Discharge status: Home / Self Care | Payer: Self-pay | Source: Ambulatory Visit | Attending: Vascular Surgery

## 2013-01-08 ENCOUNTER — Ambulatory Visit (HOSPITAL_COMMUNITY): Payer: Medicare Other

## 2013-01-08 DIAGNOSIS — E785 Hyperlipidemia, unspecified: Secondary | ICD-10-CM | POA: Insufficient documentation

## 2013-01-08 DIAGNOSIS — N186 End stage renal disease: Secondary | ICD-10-CM

## 2013-01-08 DIAGNOSIS — I120 Hypertensive chronic kidney disease with stage 5 chronic kidney disease or end stage renal disease: Secondary | ICD-10-CM | POA: Insufficient documentation

## 2013-01-08 DIAGNOSIS — Z992 Dependence on renal dialysis: Secondary | ICD-10-CM | POA: Insufficient documentation

## 2013-01-08 DIAGNOSIS — I2510 Atherosclerotic heart disease of native coronary artery without angina pectoris: Secondary | ICD-10-CM | POA: Insufficient documentation

## 2013-01-08 DIAGNOSIS — I5022 Chronic systolic (congestive) heart failure: Secondary | ICD-10-CM | POA: Insufficient documentation

## 2013-01-08 HISTORY — PX: INSERTION OF DIALYSIS CATHETER: SHX1324

## 2013-01-08 LAB — CBC
Hemoglobin: 7.2 g/dL — ABNORMAL LOW (ref 12.0–15.0)
MCV: 96 fL (ref 78.0–100.0)
Platelets: 183 10*3/uL (ref 150–400)
RBC: 2.24 MIL/uL — ABNORMAL LOW (ref 3.87–5.11)
WBC: 9.7 10*3/uL (ref 4.0–10.5)

## 2013-01-08 LAB — SURGICAL PCR SCREEN: MRSA, PCR: NEGATIVE

## 2013-01-08 LAB — POCT I-STAT 4, (NA,K, GLUC, HGB,HCT): Glucose, Bld: 89 mg/dL (ref 70–99)

## 2013-01-08 SURGERY — INSERTION OF DIALYSIS CATHETER
Anesthesia: Monitor Anesthesia Care | Site: Neck | Wound class: Clean

## 2013-01-08 MED ORDER — PROPOFOL 10 MG/ML IV BOLUS
INTRAVENOUS | Status: DC | PRN
  Administered 2013-01-08: 10 mg via INTRAVENOUS
  Administered 2013-01-08: 30 mg via INTRAVENOUS
  Administered 2013-01-08: 20 mg via INTRAVENOUS

## 2013-01-08 MED ORDER — HEPARIN SODIUM (PORCINE) 5000 UNIT/ML IJ SOLN
INTRAMUSCULAR | Status: DC | PRN
  Administered 2013-01-08: 15:00:00

## 2013-01-08 MED ORDER — OXYCODONE HCL 5 MG/5ML PO SOLN
5.0000 mg | Freq: Once | ORAL | Status: DC | PRN
Start: 2013-01-08 — End: 2013-01-08

## 2013-01-08 MED ORDER — HEPARIN SODIUM (PORCINE) 1000 UNIT/ML IJ SOLN
INTRAMUSCULAR | Status: AC
  Filled 2013-01-08: qty 1

## 2013-01-08 MED ORDER — SODIUM CHLORIDE 0.9 % IV SOLN
INTRAVENOUS | Status: DC | PRN
  Administered 2013-01-08: 15:00:00 via INTRAVENOUS

## 2013-01-08 MED ORDER — LIDOCAINE-EPINEPHRINE (PF) 1 %-1:200000 IJ SOLN
INTRAMUSCULAR | Status: DC | PRN
  Administered 2013-01-08: 30 mL

## 2013-01-08 MED ORDER — SODIUM CHLORIDE 0.9 % IV SOLN: INTRAVENOUS | Status: DC

## 2013-01-08 MED ORDER — 0.9 % SODIUM CHLORIDE (POUR BTL) OPTIME
TOPICAL | Status: DC | PRN
  Administered 2013-01-08: 900 mL

## 2013-01-08 MED ORDER — FENTANYL CITRATE 0.05 MG/ML IJ SOLN: 25.0000 ug | INTRAMUSCULAR | Status: DC | PRN

## 2013-01-08 MED ORDER — ONDANSETRON HCL 4 MG/2ML IJ SOLN: 4.0000 mg | Freq: Four times a day (QID) | INTRAMUSCULAR | Status: DC | PRN

## 2013-01-08 MED ORDER — HEPARIN SODIUM (PORCINE) 1000 UNIT/ML IJ SOLN
INTRAMUSCULAR | Status: DC | PRN
  Administered 2013-01-08: 10 mL

## 2013-01-08 MED ORDER — OXYCODONE-ACETAMINOPHEN 5-325 MG PO TABS: 1.0000 | ORAL_TABLET | ORAL | Status: DC | PRN

## 2013-01-08 MED ORDER — MUPIROCIN 2 % EX OINT
TOPICAL_OINTMENT | Freq: Two times a day (BID) | CUTANEOUS | Status: DC
  Administered 2013-01-08: 11:00:00 via NASAL
  Filled 2013-01-08: qty 22

## 2013-01-08 MED ORDER — DEXTROSE 5 % IV SOLN
1.5000 g | INTRAVENOUS | Status: AC
  Administered 2013-01-08: 1.5 g via INTRAVENOUS
  Filled 2013-01-08: qty 1.5

## 2013-01-08 MED ORDER — MIDAZOLAM HCL 5 MG/5ML IJ SOLN
INTRAMUSCULAR | Status: DC | PRN
  Administered 2013-01-08 (×2): 1 mg via INTRAVENOUS

## 2013-01-08 MED ORDER — LIDOCAINE-EPINEPHRINE (PF) 1 %-1:200000 IJ SOLN
INTRAMUSCULAR | Status: AC
Start: 2013-01-08 — End: 2013-01-08
  Filled 2013-01-08: qty 10

## 2013-01-08 MED ORDER — FENTANYL CITRATE 0.05 MG/ML IJ SOLN
INTRAMUSCULAR | Status: DC | PRN
  Administered 2013-01-08: 50 ug via INTRAVENOUS

## 2013-01-08 MED ORDER — OXYCODONE HCL 5 MG PO TABS: 5.0000 mg | ORAL_TABLET | Freq: Once | ORAL | Status: DC | PRN

## 2013-01-08 MED ORDER — SODIUM CHLORIDE 0.9 % IV SOLN
INTRAVENOUS | Status: DC
  Administered 2013-01-08: 30 mL/h via INTRAVENOUS

## 2013-01-08 SURGICAL SUPPLY — 45 items
BAG DECANTER FOR FLEXI CONT (MISCELLANEOUS) ×2 IMPLANT
CATH CANNON HEMO 15F 50CM (CATHETERS) IMPLANT
CATH CANNON HEMO 15FR 19 (HEMODIALYSIS SUPPLIES) IMPLANT
CATH CANNON HEMO 15FR 23CM (HEMODIALYSIS SUPPLIES) IMPLANT
CATH CANNON HEMO 15FR 31CM (HEMODIALYSIS SUPPLIES) IMPLANT
CATH CANNON HEMO 15FR 32CM (HEMODIALYSIS SUPPLIES) ×2 IMPLANT
CHLORAPREP W/TINT 26ML (MISCELLANEOUS) ×2 IMPLANT
CLOTH BEACON ORANGE TIMEOUT ST (SAFETY) ×2 IMPLANT
COVER PROBE W GEL 5X96 (DRAPES) IMPLANT
COVER SURGICAL LIGHT HANDLE (MISCELLANEOUS) ×2 IMPLANT
DEVICE TORQUE H2O (MISCELLANEOUS) ×1 IMPLANT
DRAPE C-ARM 42X72 X-RAY (DRAPES) ×2 IMPLANT
DRAPE CHEST BREAST 15X10 FENES (DRAPES) ×2 IMPLANT
GAUZE SPONGE 2X2 8PLY STRL LF (GAUZE/BANDAGES/DRESSINGS) ×1 IMPLANT
GAUZE SPONGE 4X4 16PLY XRAY LF (GAUZE/BANDAGES/DRESSINGS) ×3 IMPLANT
GLOVE BIO SURGEON STRL SZ7.5 (GLOVE) ×2 IMPLANT
GLOVE BIOGEL PI IND STRL 6.5 (GLOVE) IMPLANT
GLOVE BIOGEL PI IND STRL 8 (GLOVE) ×1 IMPLANT
GLOVE BIOGEL PI INDICATOR 6.5 (GLOVE) ×1
GLOVE BIOGEL PI INDICATOR 8 (GLOVE) ×1
GLOVE SURG SS PI 6.5 STRL IVOR (GLOVE) ×1 IMPLANT
GOWN STRL NON-REIN LRG LVL3 (GOWN DISPOSABLE) ×4 IMPLANT
GUIDEWIRE ANGLED .035X150CM (WIRE) ×2 IMPLANT
KIT BASIN OR (CUSTOM PROCEDURE TRAY) ×2 IMPLANT
KIT ROOM TURNOVER OR (KITS) ×2 IMPLANT
NDL HYPO 25GX1X1/2 BEV (NEEDLE) ×1 IMPLANT
NEEDLE 18GX1X1/2 (RX/OR ONLY) (NEEDLE) ×2 IMPLANT
NEEDLE 22X1 1/2 (OR ONLY) (NEEDLE) ×2 IMPLANT
NEEDLE HYPO 25GX1X1/2 BEV (NEEDLE) ×2 IMPLANT
NS IRRIG 1000ML POUR BTL (IV SOLUTION) ×2 IMPLANT
PACK SURGICAL SETUP 50X90 (CUSTOM PROCEDURE TRAY) ×2 IMPLANT
PAD ARMBOARD 7.5X6 YLW CONV (MISCELLANEOUS) ×4 IMPLANT
SPONGE GAUZE 2X2 STER 10/PKG (GAUZE/BANDAGES/DRESSINGS) ×1
SUT ETHILON 3 0 PS 1 (SUTURE) ×2 IMPLANT
SUT VICRYL 4-0 PS2 18IN ABS (SUTURE) ×2 IMPLANT
SYR 20CC LL (SYRINGE) ×4 IMPLANT
SYR 30ML LL (SYRINGE) IMPLANT
SYR 5ML LL (SYRINGE) ×4 IMPLANT
SYR CONTROL 10ML LL (SYRINGE) ×2 IMPLANT
SYRINGE 10CC LL (SYRINGE) ×2 IMPLANT
TAPE CLOTH SURG 6X10 WHT LF (GAUZE/BANDAGES/DRESSINGS) ×1 IMPLANT
TOWEL OR 17X24 6PK STRL BLUE (TOWEL DISPOSABLE) ×2 IMPLANT
TOWEL OR 17X26 10 PK STRL BLUE (TOWEL DISPOSABLE) ×2 IMPLANT
WATER STERILE IRR 1000ML POUR (IV SOLUTION) ×2 IMPLANT
WIRE AMPLATZ SS-J .035X180CM (WIRE) ×2 IMPLANT

## 2013-01-08 NOTE — Op Note (Signed)
NAME: [name]    MRN: [id number] DOB: 27-May-1951    DATE OF OPERATION: 01/08/2013  PREOP DIAGNOSIS: chronic kidney disease with hematoma right upper arm AV fistula  POSTOP DIAGNOSIS: same  PROCEDURE: placement of 28 cm left subclavian tunneled dialysis catheter  SURGEON: Judeth Cornfield. Scot Dock, MD, FACS  ASSIST: none  ANESTHESIA: local with sedation   EBL: minimal  INDICATIONS: [name] is a 62 y.o. female has a right upper arm AV fistula and apparently had a large infiltrate. I was asked to place a tunneled dialysis catheter.  FINDINGS: I interrogated with the ultrasound scanner and was unable to identify either internal jugular vein. In addition there were multiple collaterals suggesting occlusion of the jugular veins. This reason I selected a left subclavian vein approach.  TECHNIQUE: The patient was taken to the operating room and sedated by anesthesia. The neck and upper chest were prepped and draped in usual sterile fashion. The left infraclavicular area was anesthetized with 1% lidocaine. The left subclavian vein was cannulated. A J-wire would not advance. Therefore I used an angled Glidewire. I was able to manipulate this into the right atrium. An end hole catheter was advanced over the Glidewire and then the Glidewire exchanged for an Amplatz wire. Tract over the wire was dilated and then a dilator and peel-away sheath were advanced over the wire and the dilator removed. A 28 cm catheter was threaded over the wire advanced through the peel-away sheath and down into the right atrium. The wire and peel-away sheath were removed. The exit site of the catheter was selected and the skin anesthetized between the 2 areas. The cath was brought to the tunnel cut to the appropriate length and the distal ports were attached. The ports withdrew easily with a flush with heparinized saline and filled with concentrated heparin per It was secured at its exit site with a 3-0 nylon suture. The IJ  cannulation site was closed with 4-0 subcuticular stitch. Sterile dressing was applied. The patient tolerated the procedure well and was transferred to the recovery room in stable condition. All needle and sponge counts were correct.  [name], MD, FACS [facility]  DATE OF DICTATION:   01/08/2013

## 2013-01-08 NOTE — Preoperative (Signed)
Beta Blockers   Reason not to administer Beta Blockers:labetalol taken today

## 2013-01-08 NOTE — Anesthesia Preprocedure Evaluation (Signed)
Anesthesia Evaluation  Patient identified by MRN, date of birth, ID band Patient awake    Reviewed: Allergy & Precautions, H&P , NPO status , Patient's Chart, lab work & pertinent test results  Airway Mallampati: II  Neck ROM: full    Dental   Pulmonary          Cardiovascular hypertension, + CAD + Valvular Problems/Murmurs AI and MR     Neuro/Psych    GI/Hepatic   Endo/Other    Renal/GU ESRF and DialysisRenal disease     Musculoskeletal   Abdominal   Peds  Hematology   Anesthesia Other Findings   Reproductive/Obstetrics                           Anesthesia Physical Anesthesia Plan  ASA: III  Anesthesia Plan: MAC   Post-op Pain Management:    Induction: Intravenous  Airway Management Planned: Simple Face Mask  Additional Equipment:   Intra-op Plan:   Post-operative Plan:   Informed Consent: I have reviewed the patients History and Physical, chart, labs and discussed the procedure including the risks, benefits and alternatives for the proposed anesthesia with the patient or authorized representative who has indicated his/her understanding and acceptance.     Plan Discussed with: CRNA and Surgeon  Anesthesia Plan Comments:         Anesthesia Quick Evaluation

## 2013-01-08 NOTE — Anesthesia Postprocedure Evaluation (Signed)
Anesthesia Post Note  Patient: [name]  Procedure(s) Performed: Procedure(s) (LRB): INSERTION OF DIALYSIS CATHETER (N/A)  Anesthesia type: MAC  Patient location: PACU  Post pain: Pain level controlled and Adequate analgesia  Post assessment: Post-op Vital signs reviewed, Patient's Cardiovascular Status Stable and Respiratory Function Stable  Last Vitals:  Filed Vitals:   01/08/13 1727  BP: 136/52  Pulse: 69  Temp: 36.4 C  Resp: 18    Post vital signs: Reviewed and stable  Level of consciousness: awake, alert  and oriented  Complications: No apparent anesthesia complications

## 2013-01-08 NOTE — Transfer of Care (Signed)
Immediate Anesthesia Transfer of Care Note  Patient: [name]  Procedure(s) Performed: Procedure(s): INSERTION OF DIALYSIS CATHETER (N/A)  Patient Location: PACU  Anesthesia Type:MAC  Level of Consciousness: awake, alert  and oriented  Airway & Oxygen Therapy: Patient Spontanous Breathing and Patient connected to nasal cannula oxygen  Post-op Assessment: Report given to PACU RN, Post -op Vital signs reviewed and stable and Patient moving all extremities X 4  Post vital signs: Reviewed and stable  Complications: No apparent anesthesia complications

## 2013-01-08 NOTE — H&P (Signed)
Vascular and Vein Specialist of [facility]  Patient name: [name] MRN: [id number] DOB: [birth year] Sex: female  REASON FOR CONSULT: Needs Diatek catheter for dialysis.   HPI: [name] is a 61 y.o. female who has a large infiltrate in her right upper arm AVF last Friday. She normally does dialysis on TTS. She last had HD on Sunday, 4 days ago. We were asked to place a Diatek catheter. She does HD in [place]. AVF has been in for about 1 year.    Past Medical History  Diagnosis Date  . Diseases of tricuspid valve   . Mitral valve insufficiency and aortic valve insufficiency   . Unspecified essential hypertension   . Other and unspecified hyperlipidemia   . Coronary atherosclerosis of native coronary artery   . Chronic systolic heart failure   . Secondary cardiomyopathy, unspecified   . End stage renal disease     History reviewed. No pertinent family history.  SOCIAL HISTORY: History  Substance Use Topics  . Smoking status: Never Smoker   . Smokeless tobacco: Not on file  . Alcohol Use: No    Allergies  Allergen Reactions  . Tape Rash    Plastic tape    Current Facility-Administered Medications  Medication Dose Route Frequency Provider Last Rate Last Dose  . 0.9 %  sodium chloride infusion   Intravenous Continuous Angelia Mould, MD      . 0.9 %  sodium chloride infusion   Intravenous Continuous Sydnee Levans, MD 30 mL/hr at 01/08/13 1400 30 mL/hr at 01/08/13 1400  . cefUROXime (ZINACEF) 1.5 g in dextrose 5 % 50 mL IVPB  1.5 g Intravenous 30 min Pre-Op Angelia Mould, MD      . mupirocin ointment (BACTROBAN) 2 %   Nasal BID Angelia Mould, MD        REVIEW OF SYSTEMS: Valu.Nieves ] denotes positive finding; [  ] denotes negative finding CARDIOVASCULAR:  [ ]  chest pain   [ ]  chest pressure   [ ]  palpitations   [ ]  orthopnea   Valu.Nieves ] dyspnea on exertion   [ ]  claudication   [ ]  rest pain   [ ]  DVT   [ ]  phlebitis PULMONARY:   [ ]  productive cough   [ ]  asthma    [ ]  wheezing INTEGUMENTARY:  [ ]  rashes  [ ]  ulcers CONSTITUTIONAL:  [ ]  fever   [ ]  chills  PHYSICAL EXAM: Filed Vitals:   01/08/13 0925 01/08/13 0934  BP: 126/62   Pulse: 67   Temp: 97.3 F (36.3 C)   TempSrc: Oral   Resp: 18   Height:  5\' 4"  (1.626 m)  Weight:  151 lb 7.3 oz (68.7 kg)  SpO2: 100%    Body mass index is 25.98 kg/(m^2). GENERAL: The patient is a well-nourished female, in no acute distress. The vital signs are documented above. CARDIOVASCULAR: There is a regular rate and rhythm.  PULMONARY: There is good air exchange bilaterally without wheezing or rales. ABDOMEN: Soft and non-tender with normal pitched bowel sounds.  MUSCULOSKELETAL: There are no major deformities or cyanosis. NEUROLOGIC: No focal weakness or paresthesias are detected. SKIN: There are no ulcers or rashes noted. PSYCHIATRIC: The patient has a normal affect. Large hematoma right upper arm. AVF has good thrill proximally.   DATA:  Lab Results  Component Value Date   WBC 9.7 01/08/2013   HGB 7.2* 01/08/2013   HCT 21.5* 01/08/2013   MCV 96.0 01/08/2013  PLT 183 01/08/2013   Lab Results  Component Value Date   NA 134* 01/08/2013   K 5.2* 01/08/2013   MEDICAL ISSUES: Plan placement of Diatek catheter. Procedure and risks discussed with patient.   [place] [facility]: [phone]

## 2013-01-08 NOTE — Progress Notes (Addendum)
Notified Dr. Marin Comment of pt's. Hgb. Pt. Reporting yesterday she was very dizzy but today just feels lightheaded. States she has low blood. Stated to draw cbc.  Requested ekg,and cardiac notes from [facility]. requested cxr from [facility].also checked w/ [facility] if pt. Had  Sleep study- none on file.  Dr. Woody Seller in [place]-primary,md. Dr. Redmond Pulling [place]-sleep study  Called Dr. Redmond Pulling office in [place], states he recommended sleep study ,but pt. Has not had it done.  Requested cardiac records from [facility].  Last cardiologist visit was 2 yrs. Ago with Dr.Depre, [place] in [place]. Pt. States he is no longer there and she has not gotten another doctor.  Dr.Hoderine aware of cbc results along with Dr. Scot Dock.

## 2013-01-09 ENCOUNTER — Encounter (HOSPITAL_COMMUNITY): Payer: Self-pay | Admitting: Vascular Surgery

## 2013-01-12 ENCOUNTER — Encounter (HOSPITAL_COMMUNITY): Payer: Self-pay | Admitting: Pharmacy Technician

## 2013-01-12 ENCOUNTER — Other Ambulatory Visit: Payer: Self-pay

## 2013-01-16 ENCOUNTER — Ambulatory Visit: Payer: Self-pay | Admitting: Vascular Surgery

## 2013-01-19 SURGERY — ASSESSMENT, SHUNT FUNCTION, WITH CONTRAST RADIOGRAPHIC STUDY
Anesthesia: LOCAL | Laterality: Right

## 2013-01-21 ENCOUNTER — Ambulatory Visit (HOSPITAL_COMMUNITY): Admission: RE | Admit: 2013-01-21 | Payer: Medicare Other | Source: Ambulatory Visit | Admitting: Vascular Surgery

## 2013-02-11 ENCOUNTER — Ambulatory Visit: Payer: Self-pay | Admitting: Vascular Surgery

## 2013-02-11 LAB — BASIC METABOLIC PANEL
Anion Gap: 6 — ABNORMAL LOW (ref 7–16)
BUN: 23 mg/dL — ABNORMAL HIGH (ref 7–18)
Calcium, Total: 8.6 mg/dL (ref 8.5–10.1)
Chloride: 97 mmol/L — ABNORMAL LOW (ref 98–107)
Co2: 29 mmol/L (ref 21–32)
Creatinine: 6.73 mg/dL — ABNORMAL HIGH (ref 0.60–1.30)
EGFR (African American): 7 — ABNORMAL LOW
EGFR (Non-African Amer.): 6 — ABNORMAL LOW
Glucose: 77 mg/dL (ref 65–99)
Osmolality: 267 (ref 275–301)
Sodium: 132 mmol/L — ABNORMAL LOW (ref 136–145)

## 2013-02-11 LAB — CBC WITH DIFFERENTIAL/PLATELET
Basophil #: 0.1 10*3/uL (ref 0.0–0.1)
HCT: 30.7 % — ABNORMAL LOW (ref 35.0–47.0)
MCH: 31.3 pg (ref 26.0–34.0)
MCHC: 32.7 g/dL (ref 32.0–36.0)
MCV: 96 fL (ref 80–100)
Monocyte #: 1.2 x10 3/mm — ABNORMAL HIGH (ref 0.2–0.9)
Monocyte %: 11.6 %
Neutrophil %: 72.1 %
Platelet: 274 10*3/uL (ref 150–440)
RBC: 3.21 10*6/uL — ABNORMAL LOW (ref 3.80–5.20)
RDW: 14.1 % (ref 11.5–14.5)

## 2013-02-13 ENCOUNTER — Ambulatory Visit: Payer: Self-pay | Admitting: Vascular Surgery

## 2013-04-27 ENCOUNTER — Ambulatory Visit: Payer: Self-pay | Admitting: Physician Assistant

## 2013-07-27 DIAGNOSIS — T8612 Kidney transplant failure: Secondary | ICD-10-CM | POA: Insufficient documentation

## 2013-07-27 DIAGNOSIS — R079 Chest pain, unspecified: Secondary | ICD-10-CM | POA: Insufficient documentation

## 2013-07-27 DIAGNOSIS — E8589 Other amyloidosis: Secondary | ICD-10-CM | POA: Insufficient documentation

## 2013-08-04 ENCOUNTER — Telehealth (INDEPENDENT_AMBULATORY_CARE_PROVIDER_SITE_OTHER): Payer: Self-pay | Admitting: Internal Medicine

## 2013-08-04 ENCOUNTER — Ambulatory Visit (INDEPENDENT_AMBULATORY_CARE_PROVIDER_SITE_OTHER): Payer: Medicare Other | Admitting: Internal Medicine

## 2013-08-04 ENCOUNTER — Telehealth (INDEPENDENT_AMBULATORY_CARE_PROVIDER_SITE_OTHER): Payer: Self-pay | Admitting: *Deleted

## 2013-08-04 ENCOUNTER — Encounter (INDEPENDENT_AMBULATORY_CARE_PROVIDER_SITE_OTHER): Payer: Self-pay | Admitting: Internal Medicine

## 2013-08-04 VITALS — BP 124/62 | HR 84 | Temp 97.7°F | Ht 64.0 in | Wt 134.5 lb

## 2013-08-04 DIAGNOSIS — R634 Abnormal weight loss: Secondary | ICD-10-CM

## 2013-08-04 DIAGNOSIS — R7689 Other specified abnormal immunological findings in serum: Secondary | ICD-10-CM | POA: Insufficient documentation

## 2013-08-04 DIAGNOSIS — Z79899 Other long term (current) drug therapy: Secondary | ICD-10-CM

## 2013-08-04 DIAGNOSIS — D649 Anemia, unspecified: Secondary | ICD-10-CM

## 2013-08-04 DIAGNOSIS — D638 Anemia in other chronic diseases classified elsewhere: Secondary | ICD-10-CM

## 2013-08-04 DIAGNOSIS — R768 Other specified abnormal immunological findings in serum: Secondary | ICD-10-CM | POA: Insufficient documentation

## 2013-08-04 DIAGNOSIS — N186 End stage renal disease: Secondary | ICD-10-CM

## 2013-08-04 NOTE — Telephone Encounter (Signed)
No answer at home #

## 2013-08-04 NOTE — Telephone Encounter (Signed)
.  Per Lelon Perla labs to be done.

## 2013-08-04 NOTE — Progress Notes (Addendum)
Subjective:     Patient ID: [name], female   DOB: [birth year], 62 y.o.   MRN: [id number]  HPI Referred to our office by Dr. Hinda Lenis for weight loss, poor appetite and low hemoglobin. September  H and H 7.9 and 29.2,  Recently started Megace. She says her appetite is better. In May her hemoglobin was 6.7. In June her hemoglobin 8.7. In July her hemoglobin was 9.9, In August her hemoglobin was 10.5 Iron 13, %SAT 15, Ferritin 1119, TIBC 87, UIBC 74. Noted her ferritin in August 790, July 973, June 1162. Albumin 3.1. She tells today she has had pain in her epigastric region. She has had pain since March. She tells me she was admitted to [facility] in June with abdominal and remained overnight. Her WBC was up and she was evaluated by Dr. Truddie Hidden.  Felt no acute surgical intervention warranted as hernia is easily reduced. Noted to have a partial small bowel obstruction.  Recently seen at [facility] for US abdomen for evaluation of renal transplant.  Her appetite is not good. She has lost seventeen pounds since February of this year. There is no abdominal pain at this time.  Her BMs are dark brown to light brown. She has not had any black stools. Usually has a BM daily.   Hx of ESRD and takes dialysis on T-Th-Sat.  05/16/2011 Colonoscopy: screening:  FINAL DIAGNOSES:  1. Examination performed to cecum.   2. Pancolonic diverticulosis. She has moderate number of diverticula  at sigmoid and descending colon and few more proximal splenic  flexure.  3. Small external hemorrhoids.   Hx ESRD, polycystic kidney disease, hypertension, dilated cardiomyopathy with low ejection fraction, mitral regurgitation, Hx of failed kidney transplant, high cholesterol,.   07/27/2013 US abdomen: Enlarged kidneys with numerous renal and hepatic cyst consistent with patient's history of autosomal dominant polycystic kidney disease. Left sided hydronephrosis. Gallbladder sludge without sonographic evidence of acute  cholecystitis.  CBC is dilated and measures maximally 1 cm and tapers distally measuring is similar to abdominal US from 12/07/2009. No rt sided hydronephrosis. Incidental finding of suspected celiac artery stenosis.  Review of Systems Current Outpatient Prescriptions  Medication Sig Dispense Refill  . acetaminophen (TYLENOL) 500 MG tablet Take 1,000 mg by mouth every 6 (six) hours as needed for pain (headache).      Marland Kitchen aspirin 81 MG tablet Take 81 mg by mouth daily.      . B Complex-C-Zn-Folic Acid (NEPHPLEX RX PO) Take 1 tablet by mouth daily.      . cinacalcet (SENSIPAR) 30 MG tablet Take 30 mg by mouth daily after supper.      . isosorbide mononitrate (IMDUR) 30 MG 24 hr tablet Take 30 mg by mouth daily.      Marland Kitchen labetalol (NORMODYNE) 300 MG tablet Take 300 mg by mouth 2 (two) times daily.      . minoxidil (LONITEN) 2.5 MG tablet Take 2.5 mg by mouth 2 (two) times daily.      Marland Kitchen oxyCODONE-acetaminophen (ROXICET) 5-325 MG per tablet Take 1-2 tablets by mouth every 4 (four) hours as needed for pain.  20 tablet  0  . sevelamer (RENAGEL) 800 MG tablet Take 800-1,600 mg by mouth See admin instructions. Take 2 tablets by mouth with meals and 1 with snacks.      . simvastatin (ZOCOR) 20 MG tablet Take 20 mg by mouth daily.       No current facility-administered medications for this visit.   Past Medical  History  Diagnosis Date  . Diseases of tricuspid valve   . Mitral valve insufficiency and aortic valve insufficiency   . Unspecified essential hypertension   . Other and unspecified hyperlipidemia   . Coronary atherosclerosis of native coronary artery   . Chronic systolic heart failure   . Secondary cardiomyopathy, unspecified   . End stage renal disease    Past Surgical History  Procedure Laterality Date  . Kidney transplant Left 2002    pt. states transplant lasted 2 yrs.  . Av fistula placement Right 2012  . Av fistula placement Left 2004  . Insertion of dialysis catheter N/A 01/08/2013     Procedure: INSERTION OF DIALYSIS CATHETER;  Surgeon: Angelia Mould, MD;  Location: [facility] OR;  Service: Vascular;  Laterality: N/A;   Allergies  Allergen Reactions  . Tape Rash    Plastic tape       Objective:   Physical Exam  Filed Vitals:   08/04/13 0930  BP: 124/62  Pulse: 84  Temp: 97.7 F (36.5 C)  Height: 5\' 4"  (1.626 m)  Weight: 134 lb 8 oz (61.009 kg)  Alert and oriented. Skin warm and dry. Oral mucosa is moist.   . Sclera anicteric, conjunctivae is pink. Thyroid not enlarged. No cervical lymphadenopathy. Lungs clear. Heart regular rate and rhythm.  Abdomen is soft. Bowel sounds are positive. No hepatomegaly. Umbilical hernia noted. No tenderness.  No edema to lower extremities.Rectal exam: guaiac negative, though there was no stool in vault.      Assessment:    Anemia.? Etiology. Weight loss of 17 pounds since February of this year. Recently started on Megace by Dr. Hinda Lenis.    I discussed this case with Dr. Laural Golden.  Plan:    Will get records from [facility]. Will get a CBC with peripheral smear, folate and Vitamin B12. Further recommendations to follow.

## 2013-08-04 NOTE — Patient Instructions (Addendum)
I am going to get records from [facility] and I will call you back with recommendations.

## 2013-08-05 ENCOUNTER — Telehealth (INDEPENDENT_AMBULATORY_CARE_PROVIDER_SITE_OTHER): Payer: Self-pay | Admitting: Internal Medicine

## 2013-08-05 DIAGNOSIS — D649 Anemia, unspecified: Secondary | ICD-10-CM

## 2013-08-05 NOTE — Telephone Encounter (Signed)
.  Per Lelon Perla labs to be done.

## 2013-08-24 ENCOUNTER — Telehealth (INDEPENDENT_AMBULATORY_CARE_PROVIDER_SITE_OTHER): Payer: Self-pay | Admitting: Internal Medicine

## 2013-08-24 NOTE — Telephone Encounter (Signed)
Talked with Daughter in law. She will call me tomorrow concerning blood work.

## 2013-10-05 DIAGNOSIS — D473 Essential (hemorrhagic) thrombocythemia: Secondary | ICD-10-CM

## 2013-10-05 DIAGNOSIS — D591 Other autoimmune hemolytic anemias: Secondary | ICD-10-CM

## 2013-10-05 DIAGNOSIS — D72829 Elevated white blood cell count, unspecified: Secondary | ICD-10-CM

## 2013-10-05 DIAGNOSIS — M7989 Other specified soft tissue disorders: Secondary | ICD-10-CM

## 2013-10-07 ENCOUNTER — Ambulatory Visit: Payer: Self-pay | Admitting: Vascular Surgery

## 2013-10-12 ENCOUNTER — Inpatient Hospital Stay: Payer: Self-pay | Admitting: Internal Medicine

## 2013-10-12 LAB — CBC WITH DIFFERENTIAL/PLATELET
Eosinophil %: 0 %
HCT: 29.6 % — ABNORMAL LOW (ref 35.0–47.0)
HGB: 9.1 g/dL — ABNORMAL LOW (ref 12.0–16.0)
Lymphocyte #: 1 10*3/uL (ref 1.0–3.6)
MCH: 26.3 pg (ref 26.0–34.0)
MCHC: 30.7 g/dL — ABNORMAL LOW (ref 32.0–36.0)
Neutrophil #: 16 10*3/uL — ABNORMAL HIGH (ref 1.4–6.5)
Neutrophil %: 89 %
RBC: 3.47 10*6/uL — ABNORMAL LOW (ref 3.80–5.20)
RDW: 18.4 % — ABNORMAL HIGH (ref 11.5–14.5)
WBC: 18 10*3/uL — ABNORMAL HIGH (ref 3.6–11.0)

## 2013-10-12 LAB — BASIC METABOLIC PANEL
BUN: 78 mg/dL — ABNORMAL HIGH (ref 7–18)
Calcium, Total: 9.7 mg/dL (ref 8.5–10.1)
Creatinine: 5.6 mg/dL — ABNORMAL HIGH (ref 0.60–1.30)
EGFR (African American): 9 — ABNORMAL LOW
EGFR (Non-African Amer.): 7 — ABNORMAL LOW
Osmolality: 299 (ref 275–301)
Potassium: 5.1 mmol/L (ref 3.5–5.1)

## 2013-10-14 LAB — CBC WITH DIFFERENTIAL/PLATELET
Basophil #: 0.2 10*3/uL — ABNORMAL HIGH (ref 0.0–0.1)
Basophil %: 1.1 %
Eosinophil #: 0.5 10*3/uL (ref 0.0–0.7)
Eosinophil %: 2.5 %
HCT: 30.4 % — ABNORMAL LOW (ref 35.0–47.0)
HGB: 9.7 g/dL — ABNORMAL LOW (ref 12.0–16.0)
Lymphocyte #: 1.4 10*3/uL (ref 1.0–3.6)
Lymphocyte %: 7.6 %
MCH: 26.8 pg (ref 26.0–34.0)
Monocyte #: 1.5 x10 3/mm — ABNORMAL HIGH (ref 0.2–0.9)
Neutrophil #: 14.6 10*3/uL — ABNORMAL HIGH (ref 1.4–6.5)
RBC: 3.62 10*6/uL — ABNORMAL LOW (ref 3.80–5.20)
RDW: 18.7 % — ABNORMAL HIGH (ref 11.5–14.5)

## 2013-10-15 ENCOUNTER — Ambulatory Visit: Payer: Self-pay | Admitting: Orthopaedic Surgery

## 2013-10-15 LAB — CBC WITH DIFFERENTIAL/PLATELET
Basophil %: 0.7 %
Eosinophil #: 0.6 10*3/uL (ref 0.0–0.7)
Eosinophil %: 4 %
HGB: 9.1 g/dL — ABNORMAL LOW (ref 12.0–16.0)
Lymphocyte #: 1.4 10*3/uL (ref 1.0–3.6)
MCH: 26.8 pg (ref 26.0–34.0)
MCHC: 31.8 g/dL — ABNORMAL LOW (ref 32.0–36.0)
Monocyte #: 1.3 x10 3/mm — ABNORMAL HIGH (ref 0.2–0.9)
Monocyte %: 8.5 %
Neutrophil #: 11.4 10*3/uL — ABNORMAL HIGH (ref 1.4–6.5)
Neutrophil %: 77.1 %

## 2013-10-15 LAB — SEDIMENTATION RATE: Erythrocyte Sed Rate: 55 mm/hr — ABNORMAL HIGH (ref 0–30)

## 2013-10-16 LAB — BODY FLUID CELL COUNT WITH DIFFERENTIAL
Lymphocytes: 7 %
Nucleated Cell Count: 53641 /mm3

## 2013-11-20 DIAGNOSIS — M009 Pyogenic arthritis, unspecified: Secondary | ICD-10-CM | POA: Insufficient documentation

## 2014-02-01 ENCOUNTER — Ambulatory Visit: Payer: Self-pay | Admitting: Vascular Surgery

## 2014-02-22 ENCOUNTER — Ambulatory Visit: Payer: Self-pay | Admitting: Physician Assistant

## 2014-11-10 ENCOUNTER — Encounter (INDEPENDENT_AMBULATORY_CARE_PROVIDER_SITE_OTHER): Payer: Self-pay

## 2014-11-17 ENCOUNTER — Ambulatory Visit: Payer: Self-pay | Admitting: Vascular Surgery

## 2014-12-07 IMAGING — CR DG CHEST 1V PORT
1 series · 1 of 1 positions shown · non-contrast
Comparison: Two-view chest 01/08/2013.

CLINICAL DATA: Tunneled dialysis catheter placement.

PORTABLE CHEST - 1 VIEW

[AP]
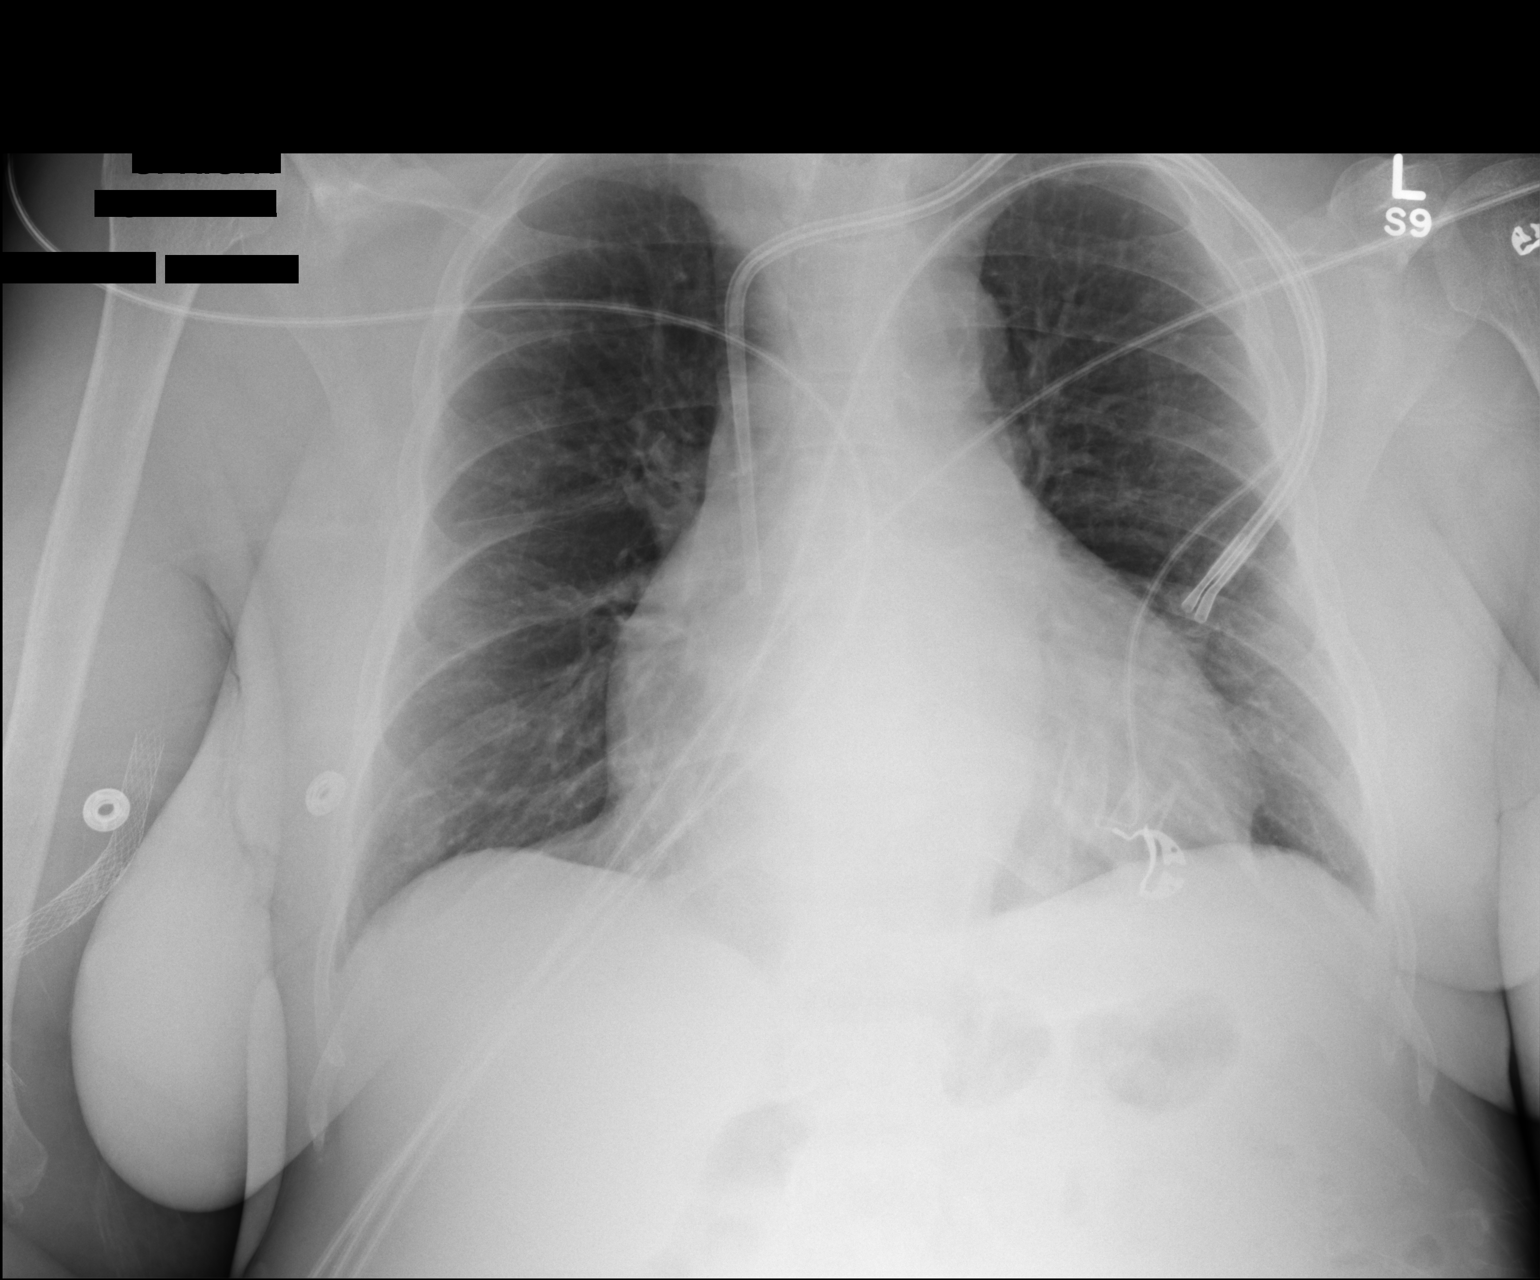

[1 of 1 positions shown; findings below may reference images not displayed]

FINDINGS: A dual lumen dialysis catheter has been placed via a left
subclavian approach.  The distal ileum is above the cavoatrial
junction.  There is no pneumothorax.  The heart is enlarged.  There
is no significant edema or effusion to suggest failure.  The lungs
are clear.  The visualized soft tissues and bony thorax are
unremarkable.
IMPRESSION: 1.  Interval placement of a left subclavian Port-A-Cath.  The tip
is above the cavoatrial junction.
2.  Stable cardiomegaly without failure.
3.  The lung volumes have decreased slightly.

## 2015-01-17 ENCOUNTER — Ambulatory Visit: Payer: Self-pay | Admitting: Vascular Surgery

## 2015-01-26 ENCOUNTER — Ambulatory Visit: Payer: Self-pay | Admitting: Vascular Surgery

## 2015-03-08 NOTE — Op Note (Signed)
PATIENT NAME:  [name], [name] MR#:  [id number] DATE OF BIRTH:  1951-05-05  DATE OF PROCEDURE:  07/28/2012  PREOPERATIVE DIAGNOSES:  1. Endstage renal disease.  2. Nonfunctional left jugular PermCath.   POSTOPERATIVE DIAGNOSES:  1. Endstage renal disease.  2. Nonfunctional left jugular PermCath.   PROCEDURE: Removal and replacement through same venous access of left jugular PermCath.   SURGEON: Algernon Huxley, M.D.   ANESTHESIA: Local with moderate conscious sedation.   ESTIMATED BLOOD LOSS: 25 mL.  FLUOROSCOPY TIME: Approximately one minute.   CONTRAST USED: None.   INDICATION FOR PROCEDURE: The patient has end-stage renal disease and nonfunctional PermCath.   DESCRIPTION OF PROCEDURE: The patient was brought to the interventional vascular radiology suite. The existing catheter was sterilely prepped and draped and a sterile surgical field was created. An Amplatz superstiff wire was placed through the existing catheter and the catheter was removed. I then replaced a 23 cm tip-to-cuff tunneled hemodialysis catheter using       the Dura Max catheter over the wire parking the catheter tip in the right atrium using fluoroscopic guidance. It was secured to the chest wall with two Prolene sutures and a 4-0 Monocryl pursestring suture was placed around the exit site. A sterile dressing was placed. ____________________________ Algernon Huxley, MD jsd:slb D: 07/28/2012 16:21:31 ET T: 07/29/2012 10:24:24 ET JOB#: [id number]  cc: Algernon Huxley, MD, <Dictator> Algernon Huxley MD ELECTRONICALLY SIGNED 08/04/2012 8:59

## 2015-03-08 NOTE — Op Note (Signed)
PATIENT NAME:  [name], [name] MR#:  [id number] DATE OF BIRTH:  1951/05/03  DATE OF PROCEDURE:  11/03/2012  PREOPERATIVE DIAGNOSES:  1.  End-stage renal disease.  2.  Poorly functioning right arm arteriovenous fistula with large hematoma present. 3.  Hypertension.    POSTOPERATIVE DIAGNOSES: 1.  End-stage renal disease.  2.  Poorly functioning right arm arteriovenous fistula with large hematoma present.  3.  Hypertension.   PROCEDURE:  1.  Ultrasound guidance for vascular access to right arm arteriovenous fistula.  2,  Right upper extremity fistulogram and central venogram.  3.  Covered stent placement to mid upper arm portion of the arteriovenous fistula for multiple areas of bleeding into the hematoma.   SURGEON: Algernon Huxley, M.D.   ANESTHESIA: Local with moderate conscious sedation.   ESTIMATED BLOOD LOSS: Approximately 25 mL.  CONTRAST USED: Approximately 40 mL Visipaque.   INDICATION FOR PROCEDURE: This is a female well known to [place] for her difficult dialysis access situation. She has a right brachiobasilic AV fistula. This has been used many times, but has been very difficult to be reliably used. She has had areas of significant hematomas and bleeding and has a sizable hematoma currently present. The area has also been intervened upon in a couple spots within the AV fistula. Her last ultrasound a month or so ago showed no significant areas of high-grade stenosis. Attempts at access have been intermittently successfully, but she now has a very large hematoma in the arm. Further evaluation is performed with a fistulogram.   DESCRIPTION OF PROCEDURE: The patient's right upper extremity was sterilely prepped and draped and a sterile surgical field was created. The fistula was visualized by ultrasound as was the hematoma. I accessed the fistula in a retrograde fashion in the basilic vein just above the area where it joined the deep venous system in the upper arm. Imaging performed through  the sheath demonstrated a series of areas of bleeding from the fistula into the area of the hematoma clinically. These were seen on angiogram with multiple out pouchings present off the fistula. She had a pressure of approximately 220/100 trending at the start of the procedure and it is not clear if some of the persistent bleeding with dialysis is due to her severe hypertension and difficulties getting control of the fistula with her access attempts. As there were multiple areas of bleeding and a large hematoma, I elected to cover this area with a covered stent. There were no stenoses identified that were significant. I had to upsize to an 8 [place] sheath over a Magic torque wire. I gave a small dose of heparin and treated this area with an 8 mm diameter x 5 cm length _____ stent ironed out with an 8 mm balloon. This resolved the multiple areas of significant hemorrhage with only 1 nearer to the access site that was not easy to be covered, remaining present.  At this point, I felt we had treated this as best endovascularly as could be treated and I elected to terminate the procedure. The sheath was removed around a 4-0 Monocryl pursestring suture. Pressure was held. Sterile dressing was placed. The patient tolerated the procedure well and was taken to the recovery room in stable condition.  ____________________________ Algernon Huxley, MD jsd:aw D: 11/03/2012 13:37:05 ET T: 11/04/2012 12:38:42 ET JOB#: [id number]  cc: Algernon Huxley, MD, <Dictator> Algernon Huxley MD ELECTRONICALLY SIGNED 11/07/2012 18:35

## 2015-03-08 NOTE — Op Note (Signed)
PATIENT NAME:  JAILEY, BISSEN MR#:  [id number] DATE OF BIRTH:  1951/11/07  DATE OF PROCEDURE:  09/15/2012  PREOPERATIVE DIAGNOSES:  1. Endstage renal disease.  2. Poorly functioning right arm AV fistula.  3. Hypertension.   POSTOPERATIVE DIAGNOSES:  1. Endstage renal disease.  2. Poorly functioning right arm AV fistula.  3. Hypertension.   PROCEDURES:  1. Ultrasound guidance for vascular access to right arm brachiobasilic AV fistula.  2. Right upper extremity fistulogram.  3. Percutaneous transluminal angioplasty with 7 and 8 mm angioplasty balloons of the basilic vein at its confluence in the deep venous system.  4. Percutaneous transluminal angioplasty of perianastomotic stenosis within the basilic vein with a 7 mm diameter angioplasty balloon.   SURGEON: Algernon Huxley, M.D.   ANESTHESIA: Local with moderate conscious sedation.   ESTIMATED BLOOD LOSS: Approximately 25 mL.  FLUOROSCOPY TIME: 2.5 minutes.   CONTRAST USED: 45 mL.  INDICATION FOR PROCEDURE: This is a 64 year old African American female with end-stage renal disease. Her right arm AV fistula has been difficult to use and she had significant right arm swelling. This was rested for several weeks she had a catheter placed. She is brought in for fistulogram for further evaluation of the fistula and possible improvement in flow. Risks and benefits were discussed and informed consent was obtained.   DESCRIPTION OF PROCEDURE: The patient was brought to the vascular interventional radiology suite. The right upper extremity was sterilely prepped and draped and a sterile surgical field was created. The fistula was accessed under direct ultrasound guidance, in the midportion, initially in an antegrade direction with a micropuncture needle and  micropuncture wire and sheath were placed. Imaging showed a moderate stenosis at the confluence of the basilic vein, in the deep venous system. This was in the 55 to 60% range. The remainder of  the central venous circulation was patent. I up-sized to a 6 [place] sheath and heparinized the patient and crossed this lesion without difficulty with a Magic torque wire and treated first with a 7 and then with an 8 mm diameter angioplasty balloon with only mild residual stenosis remaining. While the balloon was inflated, inflated opacification was performed to evaluate the arterial side. This did show a more significant stenosis in the 70 to 80% range with the tightest area likely being the vein to vein anastomosis from the confluence due to intimal hyperplasia. After treating the more central lesion, I was able to flip the 6 French sheath with the Magic torque wire without significant difficulty and cross the lesion and park a wire into the brachial artery. Balloon angioplasty was performed with a 7 mm diameter angioplasty balloon, in the perianastomotic stenotic area. A tight waste was taken which resolved with angioplasty and the flow was significantly improved after angioplasty with only mild residual stenosis. At this point, I elected to terminate the procedure. The sheath was removed around a 4-0 Monocryl pursestring suture, pressure was held, and a sterile dressing was placed. The patient tolerated the procedure well and was taken the recovery room in stable condition.  ____________________________ Algernon Huxley, MD jsd:slb D: 09/15/2012 13:48:11 ET T: 09/15/2012 14:16:34 ET JOB#: [id number]  cc: Algernon Huxley, MD, <Dictator> Algernon Huxley MD ELECTRONICALLY SIGNED 09/17/2012 11:36

## 2015-03-11 NOTE — Op Note (Signed)
PATIENT NAME:  [name], [name] MR#:  [id number] DATE OF BIRTH:  1951/03/12  DATE OF PROCEDURE:  12/15/2012  PREOPERATIVE DIAGNOSES: 1. End-stage renal disease.  2. Poorly functioning right arm arteriovenous fistula.  3. Hypertension.   POSTOPERATIVE DIAGNOSES:  1. End-stage renal disease.  2. Poorly functioning right arm arteriovenous fistula.  3. Hypertension.   PROCEDURE:   1. Ultrasound guidance for vascular access to right brachiobasilic arteriovenous fistula.  2. Percutaneous transluminal angioplasty with 7 mm diameter angioplasty balloon in the basilic vein.   SURGEON: Leotis Pain, MD   ANESTHESIA: Local with moderate conscious sedation.   ESTIMATED BLOOD LOSS: Approximately 25 mL.  FLUOROSCOPY TIME:  2 minutes.   CONTRAST USED: 25 mL.   INDICATION FOR PROCEDURE: The patient is a 64 year old African-American female with end-stage renal disease. She was originally scheduled to have her PermCath removed today; however, when she showed up the Dialysis Center sent word that they were pulling clots from her AV fistula, and it would be inappropriate to remove her catheter at this time with question about the function of her AV fistula. The risks and benefits were discussed for fistulogram performance. Informed consent was obtained.   DESCRIPTION OF PROCEDURE: The patient was brought to the vascular interventional radiology suite. The right upper extremity was sterilely prepped and draped, and a sterile surgical field was created. The fistula was accessed just beyond the previously placed stent in the basilic vein with a Micropuncture needle. A Micropuncture wire and sheath were then placed and permanent image was recorded. The patient was given a dose of heparin. Imaging performed through this sheath showed an area that appeared to narrow as the basilic vein joined the deep venous system. This had a difficult angle and did not appear high-grade, but was the only abnormality that was detected  within the AV fistula that could explain any malfunction. There was a little bit of sluggish flow around the catheter itself in the chest, but the vein still appeared to be patent. I upsized to a 6-French sheath, crossed this lesion without difficulty with a Magic Torque Wire and treated it with a 7 mm diameter angioplasty balloon. There was not a lot of waist taken. With the balloon inflated, imaging was performed to evaluate the anastomosis and the arterial side stent which were widely patent. Completion angiogram following this showed what appeared to be some improvement in flow without flow limitation.  I elected to terminate the procedure. The sheath was removed around a 4-0 Monocryl pursestring suture. Pressure was held and a sterile dressing was placed. The patient tolerated the procedure well and was taken to the recovery room in stable condition.   ____________________________ Algernon Huxley, MD jsd:cb D: 12/15/2012 10:31:25 ET T: 12/15/2012 10:50:55 ET JOB#: [id number]  cc: Algernon Huxley, MD, <Dictator> Algernon Huxley MD ELECTRONICALLY SIGNED 12/17/2012 7:34

## 2015-03-11 NOTE — Consult Note (Signed)
PATIENT NAMEMarland [name]  Brittany, [name] [id number] OF BIRTH:  [birth year] OF ADMISSION:  10/12/2013 PHYSICIAN: Dr. Loletha Grayer  CHIEF COMPLAINT: Left Shoulder pain and fluid collection tracking down Left arm.  OF PRESENT ILLNESS: This is a 64 year old African American female with a history of end-stage renal disease on hemodialysis, who was transfer with fever, right upper arm pain and swelling to [facility] on 10/09/2013.  She was septic and treated with a broad-spectrum antibiotics including vancomycin and Invanz. Pt had a CT of her right arm showing a  fluid collection at the right upper extremity fistula believed to be an abscess versus pseudoaneurysm. Pseudoanyseurm was reparied on 10/13/2013.  I was consulted today due to her persistent Left upper extremity pain.   states that she fell in the tub 3 months ago and ever since she was been unable to move her left shoulder. This has caused her great disability.  Worse with movement, better with rest, she has significant swelling and pain with minimal movement.  She denies any numbness or motor dysfunction.  pain rated 3/10 with rest, 10/10 with movement.  no currentfevers, +chills, no chest pain, no palpitations.  OF SYSTEMS:  CHILLS, fatigue, weakness. Denies blurry vision, double vision, eye pain. NOSE, THROAT: Denies tinnitus, ear pain, hearing loss. Denies cough, wheeze, shortness of breath. Denies chest pain, palpitations, edema. Denies nausea, vomiting, diarrhea, abdominal pain. She is anuric.  Denies nocturia or thyroid problems. AND LYMPHATIC: Denies easy bruising or bleeding. Denies any rashes or lesions. + pain in left shoulderDenies paralysis, paresthesias. Denies anxiety or depressive symptoms.    MEDICAL HISTORY: End-stage renal disease on dialysis for approximately 12 years. She is currently anuric. History of pulmonary hypertension, mitral valve regurgitation, history of failed kidney transplant which took place back in 2000. Coronary artery  disease; however, cardiac catheterization revealed no obstructive disease or no cardiomyopathy, as well as a history of osteoarthritis and anemia secondary to chronic disease. Right fistula pseduanysursm repair 10/13/2013  HISTORY: Denies any alcohol, tobacco or drug usage.  HISTORY: Son has polycystic kidney disease and is also on dialysis.  ADHESIVES.  MEDICATIONS: Include aspirin 81 mg p.o. daily, Imdur 30 mg p.o. daily, labetalol 300 mg p.o. b.i.d., losartan 100 mg daily, simvastatin 20 mg p.o. at bedtime MEDS: acetaminophen, ASA 57m; isosorbide nonmitrate 368mdaily, labetalbol 30077mid; losartan 100m1mily; morphine 2-4mg 60mq4 hrs prn pain, ranitidine 150mg 76msimvastatin 20mg a74mdtime, vancomycin IV per pharmacy protocol; oxycodone 5mg q4h59mprn pain; sevelamer 1600mg tid92mrasound of LUE: large fluid collectionof Left shoulder and humerus;large loculated fluid collection traction from the shoulder down the humerus in the sub facial plane.  concern for abscess vs hematoma; with an anterior inferior shoulder dislocationper CT report of Left arm/shoulder FINDINGS:is anterior inferior dislocation of the humeral head. This isuncertain duration. There is a large low-density fluid collectionto the deltoid muscle along the lateral aspect of the shoulderinto the forearm to the level of the mid humeral level. It11 cm AP by 6 cm transversely by at least 10 cm in superiorinferior dimension and exhibits Hounsfield measurement of +9.are at least three other abnormal fluid collections posteriormedial to the glenohumeral joint and proximal humerus which arethe same density. The largest of these measures4.2 cm in greatest dimension but the margins areOne of the fluid collections is closely applied to thecortex of the humeral head and proximal metaphysis likelythe bursa. There is increased density within thefat diffusely. No significant soft tissue enhancementdemonstrated. As best as can be determined the fluid  collectionsoutside the confines of the muscles. There is no mass effectthe contrast filled vasculature in the left axilla and proximal is very mildly increased interstitial density in the inferiorof visualized portion of the upper lobe of the left lung.  There is anterior inferior dislocation of the humeral head withto the glenoid that is of uncertain duration. There areabnormal fluid collections within the soft tissues of theand proximal upper arm on the left corresponding to thefindings of 26 November. Some of the medial fluidmay communicate with one-another and likely are bursalare again. The larger lateral fluid collection also likelya variant enlarged bursa. While the density of the fluidis low one certainly cannot exclude active infection.hematomas could produce a similar appearance but acute blood issuspected.There are no bony changes to suggest osteomyelitis currently.Increased density in the subcutaneous fat is consistent withor edema.    EXAMINATION: SIGNS: Temp; 36.8, HR 84, RR 18, bp 145/77, spoO2 100 on room airA and Ox3 nadNormocephalic, atraumatic. Pupils equal, round and reactive to light. Extraocular muscles intact. Supple. Left IJ in placelabored respirationsregular rate +1 radial pusle Pt was minimal ROM at the left shouler joint; very painful with passive motion.  Pt has pain with light tough of the upper arm as well as a significantly swollen shoulder with anterior translation of the joint. Pt has intact sensation to light touch C5-T1 able to cross fingers, give ok sign and give thumbs up.  Grip 4/5 wrist flexion and extension 4/5 elbove flextion and extension 4/5 shoulder no movement due to pain swollen shoulder.   DATA:14.8 H/H 9.1 and 28.6 with left shift neutraphils 11.4  Aspiration of Left shoulder joint Dr. Arvella Merles 64 y/o women with increased WBC privious septic with swollen left shoulder and concern for abscesses seen on CT scan with a chronically dislocation shoulder  joint.  Pt also has privious fistula on left arm. of procedure:  After risks and benifits of Left shoulder aspiration were discussed with the Pt and written consent was signed.  Pt was prepped in a sterile mannor and operator used sterile precautions to use an 18 guauge spinal needle to aspirate her left shoulder.  Upon introduction of the needle, 10 mL of turbid fluid was aspirated.  A 68m seringe was then exchanged and another 434mof turbid fluid was withdrawn.  Needle was then removed and a sterile dressing was applied. Pt tolerated the procedure well and had some pain releif after the high volume fluid aspiration.  AND PLAN: A [age]ear old African American female with history of end-stage renal disease on hemodialysis for 12 years, as well as hypertension with repair of her right  fistula on 11/25 with a chronically dislocated left shoulder with fluid collection tracking from the shoulder joint into the upper left arm; with grossly turbid fluid on left shoulder aspiration.  . Sepsis secondary to shoulder abscess and chronically dislocated left shoulder: Currently on vancomycin Pending labs of fluid aspiration including cell count and gram stain; ESR all stat I spoke with Dr. WiLeslye Peereguard my recommendations.  With a privious history of a fistula on the left side and with a chronically dislocated left shoulder, at least 3 months of duration, and fluid collection tracking down her left arm from the shoulder into the midhumerus, I would recommend transfering Pt to a higher level of care.  Due to the abnormal anatomy of her vascularature, the large fluid collection likley abscess in her upper arm, in combination with a chronically dislocated shoulder, the Pt with need a significant reconsutive procedue and  a orthopaedic specialist in shoulder reconstruction and vascular surgeon to assist with the approach.     Electronic Signatures: Anatasia Sella (MD)  (Signed on 27-Nov-14 18:28)  Authored  Last  Updated: 27-Nov-14 18:28 by Jenavee Sella (MD)

## 2015-03-11 NOTE — Op Note (Signed)
PATIENT NAME:  [name], [name] MR#:  [id number] DATE OF BIRTH:  04/10/1951  DATE OF PROCEDURE:  10/13/2013  PREOPERATIVE DIAGNOSES:  1.  Pseudoaneurysm of right basilic fistula.  2.  End-stage renal disease requiring hemodialysis.  3.  Complication of dialysis device.   POSTOPERATIVE DIAGNOSES: 1.  Pseudoaneurysm of right basilic fistula.  2.  End-stage renal disease requiring hemodialysis.  3.  Complication of dialysis device.   PROCEDURES PERFORMED: 1.  Contrast injection, right arm brachiobasilic fistula.  2. Placement of a Viabahn covered stent for exclusion of pseudoaneurysm, right arm AV fistula.   SURGEON: Katha Cabal, M.D.   SEDATION: Versed 4 mg plus fentanyl 150 mcg administered IV. Continuous ECG, pulse oximetry and cardiopulmonary monitoring is performed throughout the entire procedure by the interventional radiology nurse. Total sedation time was 1 hour.   ACCESS: A 7-French sheath, antegrade direction, left brachiobasilic fistula.   CONTRAST USED: Isovue 35 mL.   FLUOROSCOPY TIME: 4.0 minutes.   INDICATIONS: Ms. Zunino is a 64 year old woman maintained on hemodialysis who was found to have swelling and pain of her right arm. Further workup also demonstrated a large pseudoaneurysm of her AV fistula. Risks and benefits for exclusion of the aneurysm were reviewed. The patient has agreed to proceed.   DESCRIPTION OF PROCEDURE: The patient is taken to the special procedure suite, placed in the supine position. After adequate sedation is achieved, she is positioned supine with her right arm extended palm upward. The right arm is prepped and draped in a sterile fashion.   Ultrasound is utilized secondary to the loss of landmarks given the large hematoma, and the access is visualized. Site is chosen for access. Lidocaine 1% is infiltrated and a micropuncture needle is inserted under direct ultrasound visualization. Image is recorded for the permanent record. Microwire is  advanced followed by J-wire. A 6-French sheath is inserted initially and images are obtained. There is a complete disconnection of 2 previously placed stents with a large pseudoaneurysm noted. Using a Glidewire and a BNK catheter, the wire and catheter cross the pseudoaneurysm and are advanced into the more proximal stent. The wire is then exchanged for an Amplatz Super Stiff and the sheath is up sized to a 7-French sheath. Subsequently, the wire is exchanged for a V-18 and an 8 x 100 Viabahn stent is used to bridge the 2 stents. It is post dilated with an 8 mm balloon. Followup angiography demonstrates that the pseudoaneurysm has now been successfully excluded, and that there is normalization of flow through the fistula. It should be noted, however, given the very odd construction of this fistula and now the multiple stents that are in place, I do not believe that long term this represents a solid, stable access. This will be discussed with the patient and future plans regarding new access and potentially a HeRO graft on the right, if needed, can be entertained once the hematoma has resolved.     ____________________________ Katha Cabal, MD ggs:dmm D: 10/14/2013 10:35:00 ET T: 10/14/2013 11:13:04 ET JOB#: [id number]  cc: Katha Cabal, MD, <Dictator> Katha Cabal MD ELECTRONICALLY SIGNED 10/26/2013 17:19

## 2015-03-11 NOTE — H&P (Signed)
PATIENT NAME:  [name], [name] MR#:  [id number] DATE OF BIRTH:  09/13/1951  DATE OF ADMISSION:  10/12/2013  REFERRING PHYSICIAN: Nonlocal, [place]: Nonlocal,   CHIEF COMPLAINT: Right arm pain.   HISTORY OF PRESENT ILLNESS: This is a 64 year old African American female with a history of end-stage renal disease on hemodialysis for approximately 12 years as well as hypertension who presented with fever, right upper arm pain and swelling to [facility] on 10/09/2013. Initially, she was treated for sepsis of unclear etiology with broad-spectrum antibiotics including vancomycin and Invanz. Blood cultures remain negative; however, she had persistent leukocytosis. Her recent CT performed at their facility searching for an etiology revealed fluid collection at the right upper extremity fistula believed to be an abscess versus pseudoaneurysm. Decision was made to transfer her to [facility] for further workup and evaluation so that she could be evaluated by her vascular surgeon. Currently, she complains only of mild tenderness over the right upper extremity fistula site; however, denies any frank pain. This is described as a soreness 2 to 3 out of 10 in intensity, worsened when palpating the region. No relieving factors. She otherwise denies any current fevers, chills, chest pain, palpitations.   REVIEW OF SYSTEMS:   CONSTITUTIONAL: Denies FEVER, CHILLS, fatigue, weakness.  EYES: Denies blurry vision, double vision, eye pain.  EARS, NOSE, THROAT: Denies tinnitus, ear pain, hearing loss.  RESPIRATORY: Denies cough, wheeze, shortness of breath.  CARDIOVASCULAR: Denies chest pain, palpitations, edema.  GASTROINTESTINAL: Denies nausea, vomiting, diarrhea, abdominal pain.  GENITOURINARY: She is anuric.   ENDOCRINE: Denies nocturia or thyroid problems.  HEMATOLOGIC AND LYMPHATIC: Denies easy bruising or bleeding.  SKIN: Denies any rashes or lesions.   MUSCULOSKELETAL: Denies pain in neck, back, shoulder, knees, hips, any arthritic symptoms.  NEUROLOGIC: Denies paralysis, paresthesias.  PSYCHIATRIC: Denies anxiety or depressive symptoms.   Otherwise, full review of systems performed by me is negative.   PAST MEDICAL HISTORY: End-stage renal disease on dialysis for approximately 12 years. She is currently anuric. History of pulmonary hypertension, mitral valve regurgitation, history of failed kidney transplant which took place back in 2000. Coronary artery disease; however, cardiac catheterization revealed no obstructive disease or no cardiomyopathy, as well as a history of osteoarthritis and anemia secondary to chronic disease.   SOCIAL HISTORY: Denies any alcohol, tobacco or drug usage.   FAMILY HISTORY: Son has polycystic kidney disease and is also on dialysis.   ALLERGIES: ADHESIVES.   HOME MEDICATIONS: Include aspirin 81 mg p.o. daily, Imdur 30 mg p.o. daily, labetalol 300 mg p.o. b.i.d., losartan 100 mg daily, simvastatin 20 mg p.o. at bedtime.   PHYSICAL EXAMINATION:  VITAL SIGNS: Temperature 98.1, heart rate 82, respirations 14, blood pressure 179/89, saturating 97% on room air.  GENERAL: Well-nourished, well-developed, African American female who is currently in no acute distress.  HEAD: Normocephalic, atraumatic.  EYES: Pupils equal, round and reactive to light. Extraocular muscles intact.  MOUTH: Moist mucosal membranes. Dentition intact. No abscess noted.  EARS, NOSE, THROAT: Throat clear without exudates. No external lesions.  NECK: Supple. No thyromegaly. No nodules. No JVD.  PULMONARY: Scattered scant rhonchi with no wheezes or rales. No use of accessory muscles. Good respiratory effort.  CHEST: Nontender to palpation.  CARDIOVASCULAR: S1, S2, regular rate and rhythm. No murmurs, rubs or gallops. No edema. Pedal pulses 2+ bilaterally.  GASTROINTESTINAL: Soft, nontender, nondistended. No masses. Positive bowel sounds. No  hepatosplenomegaly.  MUSCULOSKELETAL: No swelling, clubbing or edema. Range of  motion full in all extremities. Right upper extremity fistula has a palpable thrill. There is mild tenderness to palpation in that region. There is also gross dilatation of the fistula.  NEUROLOGICAL: Cranial nerves II through XII intact. No gross focal neurological deficits. Sensation intact. Reflexes intact.  SKIN: No ulcerations, lesions, rashes or cyanosis. Skin is warm and dry. Turgor is intact.  PSYCHIATRIC: Mood and affect within normal limits. Awake, alert, oriented x 3. Insight and judgment intact.   LABORATORY DATA: Pending from our facility, though most recent laboratory data includes sodium 127, potassium 4.8, chloride 89, bicarb 23, BUN 64, creatinine 5.93, glucose 264. WBC 18.5 which is neutrophil predominant, hemoglobin 9.1, platelets of 443. ESR of 140. CT performed revealing small fluid collection at the right upper extremity fistula which could be concerning for abscess versus aneurysm.   ASSESSMENT AND PLAN: A 64 year old [place] American female with history of end-stage renal disease on hemodialysis for 12 years, as well as hypertension. Presenting with right upper extremity pain to [facility] on 10/09/2013. She was then found to have a fluid collection around the fistula believed to be an abscess. Transferred to our facility for further workup and evaluation.  1. Sepsis secondary to fistula abscess: Her sepsis is resolving. Will continue vancomycin, pharmacy to dose given her renal failure. Will also consult vascular surgery for a fistulogram. Her culture data has remained negative. Will need to follow up on the finalization of the cultures at [facility]  2. Preoperative evaluation: She does have significant cardiac risk factors including end-stage renal disease, hypertension and documented history of coronary artery disease, though documented catheterization revealed no obstructive lesions  with normal ejection fraction. Her metabolic equivalents are less than 4 secondary to general medical conditions, though she has no active cardiac conditions including chest pain, arrhythmias, valvular dysfunction or congestive heart failure symptoms. With this, she should be considered a moderate risk for a moderate risk surgery. No further testing required prior to surgery if indeed needs to take place. Would, however, hold her aspirin and losartan prior to surgery. Continue her beta blockade.  3. Hypertension: Continue labetalol and Imdur. Losartan is on hold for procedure.  4. End-stage renal disease on hemodialysis: Consult nephrology. Continue hemodialysis.  5. Venous thromboembolism prophylaxis with sequential compression devices.   The patient is FULL CODE.   TIME SPENT: 45 minutes.    ____________________________ Aaron Mose. Arly Salminen, MD dkh:gb D: 10/12/2013 22:37:35 ET T: 10/12/2013 22:57:53 ET JOB#: [id number]  cc: Aaron Mose. Adaleah Forget, MD, <Dictator> Nyheem Binette Woodfin Ganja MD ELECTRONICALLY SIGNED 10/13/2013 0:49

## 2015-03-11 NOTE — Op Note (Signed)
PATIENT NAME:  [name], [name] MR#:  [id number] DATE OF BIRTH:  10-23-1951  DATE OF PROCEDURE:  04/27/2013  PREOPERATIVE DIAGNOSES: 1.  End-stage renal disease.  2.  Functional permanent dialysis access.  3.  Hypertension.   POSTOPERATIVE DIAGNOSES: 1.  End-stage renal disease.  2.  Functional permanent dialysis access.  3.  Hypertension.   PROCEDURE: Removal of left jugular PermCath.   SURGEON: Algernon Huxley, M.D.   ANESTHESIA: Local.   ESTIMATED BLOOD LOSS: 50 mL.   INDICATION FOR PROCEDURE: A 64 year old African American female with end-stage renal disease. Her PermCath is no longer needed and it can be removed. She has a functional permanent access.   DESCRIPTION OF PROCEDURE: The patient was brought to the vascular and interventional radiology area.  The left neck, chest and existing catheter were sterilely prepped and draped and a sterile surgical field was created. The area was copiously anesthetized with lidocaine. Hemostats were used to help dissect out the cuff and free it from the fibrous connective sheath. This was removed with gentle traction, and the catheter was removed in its entirety without difficulty. Pressure was held at the site, sterile dressing was placed. The patient tolerated the procedure well and was taken to the recovery room in stable condition.   ____________________________ Algernon Huxley, MD jsd:sb D: 04/27/2013 12:19:28 ET T: 04/27/2013 13:04:17 ET JOB#: [id number]  cc: Algernon Huxley, MD, <Dictator> Algernon Huxley MD ELECTRONICALLY SIGNED 04/29/2013 10:59

## 2015-03-11 NOTE — Op Note (Signed)
PATIENT NAME:  [name], [name] MR#:  [id number] DATE OF BIRTH:  [birth year]  DATE OF PROCEDURE:  02/13/2013  PREOPERATIVE DIAGNOSES:  1.  Hematoma, right arm.  2.  Complication dialysis device.  3.  End-stage renal disease requiring hemodialysis.   POSTOPERATIVE DIAGNOSES: 1.  Hematoma, right arm.  2.  Complication dialysis device.  3.  End-stage renal disease requiring hemodialysis.   PROCEDURE PERFORMED: Incision and drainage, right arm hematoma.   Procedure Performed by: Katha Cabal, M.D.   ANESTHESIA: General by LMA.   FLUIDS: Per anesthesia record.   ESTIMATED BLOOD LOSS: Minimal.   SPECIMEN: None.   INDICATIONS: The patient is a 64 year old, woman who has sustained a very large hematoma secondary to a difficult cannulation of a right arm fistula. The hematoma has been given a chance to resolve and has not. She continues to have significant pain and tenderness associated with this very large hematoma and she is therefore undergoing evacuation of the hematoma for palliation of her symptoms. The risks and benefits were reviewed. All questions answered. The patient agrees to proceed.   DESCRIPTION OF PROCEDURE: The patient is taken to the operating room and placed in the supine position. After adequate general anesthesia is induced, she is positioned supine with her right arm extended palm upward. The right arm is prepped and draped in a sterile fashion.   Marcaine 0.25% is then infiltrated in the soft tissue and a linear incision is created overlying the apex of the hematoma. The incision is then carried down into the hematoma and the contents are evacuated quite easily. The wound is then irrigated with sterile saline. Through an inferior lateral positioning, a small incision is created in the skin and a 10 mm JP drain was then pulled from the hematoma out this exit site. The JP is then trimmed to an appropriate length. The subcutaneous tissues are then reapproximated with running  3-0 Vicryl in 2 layers, followed by 4-0 Monocryl subcuticular for the skin and then Dermabond. The drain was secured with 3-0 nylon. It is  hooked to self suction. The patient tolerated the procedure well and there were no immediate complications.   ____________________________ Katha Cabal, MD ggs:aw D: 02/13/2013 12:13:42 ET T: 02/13/2013 12:20:15 ET JOB#: [id number]  cc: Katha Cabal, MD, <Dictator> Katha Cabal MD ELECTRONICALLY SIGNED 02/17/2013 17:22

## 2015-03-11 NOTE — Discharge Summary (Signed)
Dates of Admission and Diagnosis:  Date of Admission 12-Oct-2013   Date of Discharge 15-Oct-2113   Admitting Diagnosis Clinical sepsis   Final Diagnosis Multiple abscess left shoulder   Discharge Diagnosis 1 Chronically dislocated left shoulder   2 ESRD on HD (last wednesday) Usual schedule T, TH, SA   3 HTN   4 hyperlipidemia   5 anemia    Chief Complaint/History of Present Illness Patient was a transfer from [facility] regional for possible sepsis and an aneurysm right arm.   BC were negative at [facility].  Patient was on vancomycin and invanz at [facility]. Patient was transferred to [facility]  Patient complained of pain in the left arm. Vascular surgery did a procedure on right arm aneusym- no clot or infection seen. Ultrasound left arm showed fluid collection 12.8x5.6x11.1cm CT scan humerus- showed a chronically discolated left shoulder and multiple fluid collections Patient had one of the fluid collections aspirated and showed gross pus.   The orthopedic surgeon rec transfer to tertiary care center were surgery can be done with ortho and vascular surgery together.   Routine Chem:  24-Nov-14 22:31   Glucose, Serum  436  BUN  78  Creatinine (comp)  5.60  Sodium, Serum  128  Potassium, Serum 5.1  Chloride, Serum  90  CO2, Serum 28  Calcium (Total), Serum 9.7  Anion Gap 10  Osmolality (calc) 299  eGFR (African American)  9  eGFR (Non-African American)  7 (eGFR values <48m/min/1.73 m2 may be an indication of chronic kidney disease (CKD). Calculated eGFR is useful in patients with stable renal function. The eGFR calculation will not be reliable in acutely ill patients when serum creatinine is changing rapidly. It is not useful in  patients on dialysis. The eGFR calculation may not be applicable to patients at the low and high extremes of body sizes, pregnant women, and vegetarians.)  Routine Hem:  24-Nov-14 22:31   WBC (CBC)  18.0  RBC (CBC)  3.47  Hemoglobin (CBC)   9.1  Hematocrit (CBC)  29.6  Platelet Count (CBC)  444  MCV 85  MCH 26.3  MCHC  30.7  RDW  18.4  Neutrophil % 89.0  Lymphocyte % 5.7  Monocyte % 4.6  Eosinophil % 0.0  Basophil % 0.7  Neutrophil #  16.0  Lymphocyte # 1.0  Monocyte # 0.8  Eosinophil # 0.0  Basophil # 0.1 (Result(s) reported on 12 Oct 2013 at 11:07PM.)  27-Nov-14 04:25   WBC (CBC)  14.8  RBC (CBC)  3.39  Hemoglobin (CBC)  9.1  Hematocrit (CBC)  28.6  Platelet Count (CBC) 340  MCV 84  MCH 26.8  MCHC  31.8  RDW  18.6  Neutrophil % 77.1  Lymphocyte % 9.7  Monocyte % 8.5  Eosinophil % 4.0  Basophil % 0.7  Neutrophil #  11.4  Lymphocyte # 1.4  Monocyte #  1.3  Eosinophil # 0.6  Basophil # 0.1 (Result(s) reported on 15 Oct 2013 at 05:05AM.)   PERTINENT RADIOLOGY STUDIES: [place]    26-Nov-14 14:07, [place] Flow Doppler Upper Extrem Left (Arm)  [place] Flow Doppler Upper Extrem Left (Arm)   REASON FOR EXAM:    pain left arm  COMMENTS:       PROCEDURE: [place] - [place]OPPLER UP EXTR LEFT  - Oct 14 2013  2:07PM     CLINICAL DATA:  Left arm pain    EXAM:  [place] UP VENOUS LEFT    TECHNIQUE:  Gray-scale  sonography with graded compression, as well as color  Doppler and duplex ultrasound were performed to evaluate the upper  extremity deep venous system from the level of the subclavian vein  and including the jugular, axillary, basilic and upper cephalic  vein. Spectral Doppler was utilized to evaluate flow at rest and  with distal augmentation maneuvers.    COMPARISON:  None.    FINDINGS:  Thrombus within deep veins: None visualized. The central subclavian  and portions of the axillary vein are not well visualized not well  compressed due to underlying edema as well as the patient's  indwelling dialysis catheter.    Compressibility of deep veins:  Normal.    Duplex waveform respiratory phasicity:  Normal.  Duplex waveform response to augmentation:  Normal.    Venous reflux:  None  visualized.    Other findings: Note is made of somewhat slow flow within the  brachial and axillary veins.    Noted in the region of the deltoid muscle there is a large complex  cystic area identified which measures 12.8 x 5.6 x 11.1 cm in  greatest dimension. This may represent intramuscular/subcutaneous  hematoma. The patient has pain in this area further evaluation may  be helpful by means of MRI as able.     IMPRESSION:  No definitive deep venous thrombosis is noted. Slow flow is seen  within the brachial and axillary veins which may be related to  central occlusion is seen on recent fistulography from the previous  day.    Complex cystic lesion in the region of the deltoid muscle. Given the  patient's anticoagulation, this may represent a  subcutaneous/intramuscular hematoma. Further evaluation is  recommended.      Electronically Signed    By: Inez Catalina M.D.    On: 10/14/2013 14:40       Verified By: Everlene Farrier, M.D.,  CT:    27-Nov-14 14:01, CT Humerus Lt With Contrast  CT Humerus Lt With Contrast   REASON FOR EXAM:    Fluid collection, r/o hematoma vs abscess (patient   dilaysis patient and can be h  COMMENTS:       PROCEDURE: CT  - CT HUMERUS LEFT W  - Oct 15 2013  2:01PM     CLINICAL DATA:  Persistent pain in the left upper extremity with  known fluid collection, leukocytosis    EXAM:  CT OF THE LEFT HUMERUS WITH CONTRAST    TECHNIQUE:  Multidetector CT imaging was performed following the standard  protocol during bolus administration of intravenous contrast.  CONTRAST:  180 cc of Isovue 370 intravenously    COMPARISON:  Ultrasound of the left upper extremity dated 14 October 2013.    FINDINGS:  There is anterior inferior dislocation of the humeral head. This is  of uncertain duration. There is a large low-density fluid collection  deep to the deltoid muscle along the lateral aspect of the shoulder  extending into the forearm to the level of  the mid humeral level. It  measures 11 cm AP by 6 cm transversely by at least 10 cm in superior  to inferior dimension and exhibits Hounsfield measurement of +9.  There are at least three other abnormal fluid collections posterior  and medial to the glenohumeral joint and proximal humerus which are  approximately the same density. The largest of these measures  approximately 4.2 cm in greatest dimension but the margins are  irregular. One of the fluid collections is closely applied to  the  posterior cortex of the humeral head and proximal metaphysis likely  reflecting the bursa. There is increased density within the  subcutaneous fatdiffusely. No significant soft tissue enhancement  is demonstrated. As best as can be determined the fluid collections  lie outside the confines of the muscles. There is no mass effect  upon the contrast filled vasculature in the left axilla and proximal  arm.    There is very mildly increased interstitial density in the inferior  aspect of visualized portion of the upper lobe of the left lung.     IMPRESSION:  1. There is anterior inferior dislocation of the humeral head with  respect to the glenoid that is of uncertain duration. There are  multiple abnormal fluid collections within the soft tissues of the  shoulder and proximal upper arm on the left corresponding to the  ultrasound findings of 26 November. Some of the medial fluid  collections may communicate with one-another and likely are bursal  in are again. The larger lateral fluid collection also likely  reflects a variant enlarged bursa. While the density of the fluid  collections is low one certainly cannot exclude active infection.  Old hematomas could produce a similar appearance but acute blood is  not suspected.  2. There are no bony changes to suggest osteomyelitis currently.  3. Increased density in the subcutaneous fat is consistent with  inflammation or edema.    Electronically  Signed    By: David  Martinique    On: 10/15/2013 14:21         Verified By: DAVID A. Martinique, M.D., [place] [facility] Please see above   Condition on Discharge Satisfactory   DISCHARGE INSTRUCTIONS HOME MEDS:  Medication Reconciliation: Patient's Home Medications at Discharge:     Medication Instructions  simvastatin 20 mg oral tablet  1 tab(s) orally once a day (at bedtime)   labetalol 300 mg oral tablet  1 tab(s) orally 2 times a day   renvela 800 mg oral tablet  2 tab(s) orally 3 times a day (with meals) and 2 tablets with snacks   acetaminophen 325 mg oral tablet  2 tab(s) orally every 4 hours, As needed, pain or temp. greater than 100.4   morphine  2 milligram(s) injectable every 4 hours, As needed, pain   oxycodone 5 mg oral tablet  1 tab(s) orally every 4 hours, As needed, pain   isosorbide mononitrate 30 mg oral tablet, extended release  1 tab(s) orally once a day   ceftazidime  1 gram(s)  every 12 hours   vancomycin  750 milligram(s)  once with each dialysis   nepro carb steady oral supplement  237 milliliter(s) orally twice a day     Physician's Instructions:  Treatments None   Home Oxygen? No   Diet Renal Diet   Diet Consistency Regular Consistency   Activity Limitations As tolerated   Referrals will need orthopedics, vascular surgery and nephrology   Time frame for Follow Up Appointment 1-2 days  Doctor at [place]: Loletha Grayer (MD)  (Signed 27-Nov-14 19:17)  Authored: ADMISSION DATE AND DIAGNOSIS, CHIEF COMPLAINT/HPI, PERTINENT LABS, [place] [facility], PATIENT INSTRUCTIONS   Last Updated: 27-Nov-14 19:17 by Loletha Grayer (MD)

## 2015-03-11 NOTE — Consult Note (Signed)
Admit Diagnosis:   ABSCESS ANEURYSM RT ARM SP FISTULAGRAM.: Onset Date: 15-Oct-2013, Status: Active, Description: ABSCESS ANEURYSM RT ARM SP FISTULAGRAM.      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

## 2015-03-11 NOTE — Op Note (Signed)
PATIENT NAME:  [name], [name] MR#:  [id number] DATE OF BIRTH:  1951-04-05  DATE OF PROCEDURE:  01/16/2013  PREOPERATIVE DIAGNOSES:  1.  Complication of right arm arteriovenous fistula.  2.  Hematoma, right arm.  3.  End-stage renal disease requiring hemodialysis.   POSTOPERATIVE DIAGNOSES:  1.  Complication of right arm arteriovenous fistula.  2.  Hematoma, right arm.  3.  End-stage renal disease requiring hemodialysis.   PROCEDURES PERFORMED:  1.  Contrast injection of right arm brachiobasilic fistula.  2.  Angiography, right arm, brachial approach.   PROCEDURE PERFORMED BY:  Katha Cabal, MD.   SEDATION:  Versed 5 mg plus fentanyl 200 mcg administered IV. Continuous ECG, pulse oximetry and cardiopulmonary monitoring was performed throughout the entire procedure by the Interventional Radiology nurse. Total sedation time was 1 hour.   ACCESS:   1.  A 5 French sheath, right brachial artery.  2.  A 5 French microsheath, right brachiobasilic fistula.   CONTRAST USED:  Isovue 15 mL.   FLUOROSCOPY TIME:  1.5 minutes.   INDICATIONS:  The patient is a 64 year old woman who has had difficulty with her fistula, and recently sustained a significant hematoma during dialysis. The risks and benefits for evaluation of her the fistula and arm were reviewed. All questions answered. The patient has agreed to proceed.   DESCRIPTION OF PROCEDURE:  The patient is taken to Special Procedures and placed in the supine position. After adequate sedation is achieved, the right arm is positioned palm upward and prepped and draped in a sterile fashion.   Using ultrasound, which is placed in a sterile sleeve, the fistula is identified. It is echolucent and compressible indicating patency. Image is recorded for the permanent record. After 1% lidocaine is infiltrated in the soft tissues under direct ultrasound visualization, a micropuncture needle is used to access the fistula, microwire followed by micro  sheath. Hand injection of contrast is then used to demonstrate the fistula, which is widely patent, previously placed stent is in good condition, and there are no new abnormalities identified. Given the size of the hematoma and the suspicion that this could actually represent a pseudoaneurysm and the likelihood that this is secondary to arterial puncture during dialysis, it was elected to then accessed the brachial artery.   Ultrasound is once again utilized. The brachial artery is identified. It is echolucent and pulsatile indicating patency. Image is recorded and under direct ultrasound visualization microneedle is inserted, microwire followed by microsheath, J-wire followed by a 5-French sheath. A KMP catheter and wire are then utilized to negotiate the catheter up into the subclavian artery, and from the subclavian vantage point, the arterial system of the right arm is imaged. No extravasation or pseudoaneurysm is identified. The artery remains widely patent.   A ProClose device was then deployed with fluoroscopic guidance in the arterial puncture and the fistula access was pulled and pressure held. There are no immediate complications.   INTERPRETATION:  The images of the fistula demonstrate the fistula remains patent. It is now significantly deformed from its previous course by the hematoma but there are no new flow-limiting stenoses and the previously placed stent is in good condition. The brachial artery as well as the axillary visualized portions of the subclavian are also widely patent with no evidence of pseudoaneurysm or extravasation. There is no evidence of arterial injury at this time.   SUMMARY:  A patent fistula, patent arterial system, but given the circumstances, it appears unlikely that this access represents  a viable alternative for the patient. This will be discussed with the patient next week in followup.    ____________________________ Katha Cabal, MD ggs:jm D: 01/16/2013  18:18:57 ET T: 01/17/2013 11:40:29 ET JOB#: [id number]  cc: Katha Cabal, MD, <Dictator> Katha Cabal MD ELECTRONICALLY SIGNED 02/17/2013 17:19

## 2015-03-11 NOTE — Op Note (Signed)
PATIENT NAME:  [name], [name] MR#:  [id number] DATE OF BIRTH:  11-04-1951  DATE OF PROCEDURE:  12/24/2012  PREOPERATIVE DIAGNOSIS:  Complication, cuff tunnel dialysis catheter.   POSTOPERATIVE DIAGNOSIS:  Complication, cuff tunnel dialysis catheter.  PROCEDURE PERFORMED:  Removal of cuffed tunneled dialysis catheter, left IJ.   PROCEDURE PERFORMED BY:  Dr. Katha Cabal.   DESCRIPTION OF PROCEDURE:  The patient is positioned supine in preop special procedures holding area and the left neck, chest wall and catheter are prepped and draped in a sterile fashion.  Cuff is palpable approximately 1 cm from the skin exit site and therefore this area is anesthetized with 1% lidocaine.  An 11 blade scalpel was used to make a small incision and the catheter is then dissected down to the cuff.  Surrounding tissue for the cuff is freed and the catheter is removed.  Pressure is held at the base of the neck.  Once hemostasis had been obtained, antibiotic ointment is placed at the exit site and a sterile dressing.  The patient tolerated the procedure well and there were no immediate complications.     ____________________________ Katha Cabal, MD ggs:ea D: 12/24/2012 17:23:39 ET T: 12/25/2012 06:25:07 ET JOB#: [id number]  cc: Katha Cabal, MD, <Dictator> Katha Cabal, MD Katha Cabal MD ELECTRONICALLY SIGNED 01/05/2013 23:26

## 2015-03-11 NOTE — Op Note (Signed)
PATIENT NAME:  CHARRYSE, RABANAL MR#:  [id number] DATE OF BIRTH:  1951/05/15  DATE OF PROCEDURE:  10/07/2013  PREOPERATIVE DIAGNOSES:  1. End-stage renal disease, requiring hemodialysis.  2. Complication of arteriovenous dialysis access, right arm, with hematoma formation and pseudoaneurysm.   POSTOPERATIVE DIAGNOSES:  1. End-stage renal disease, requiring hemodialysis.  2. Complication of arteriovenous dialysis access, right arm, with hematoma formation and pseudoaneurysm.   PROCEDURE PERFORMED: Insertion of left internal jugular cuffed tunneled dialysis catheter with ultrasound and fluoroscopic guidance.   PROCEDURE PERFORMED BY: Katha Cabal, MD  DESCRIPTION OF PROCEDURE: The patient is taken to special procedures and placed in the supine position. After adequate sedation has been achieved, her left neck and chest wall are prepped and draped in a sterile fashion. Ultrasound is placed in a sterile sleeve. Ultrasound is utilized secondary to lack of appropriate landmarks and to avoid vascular injury. Under direct ultrasound visualization, the internal jugular vein is identified. It is echolucent and compressible, indicating patency. Under direct imaging, a micropuncture needle is inserted. Microwire is then advanced, followed by the micro sheath. J-wire does not track into the superior vena cava under fluoroscopy, and therefore stiff angled Glidewire is utilized, and this is advanced quite easily through the innominate and superior vena cava and then negotiated into the inferior vena cava and down to the iliac level. This allows for good purchase for tracking of a catheter from the left-sided approach.   Counterincision is then created. A small pocket fashioned with a hemostat using blunt dissection, and subsequently the dilator is passed over the Glidewire, dilator and peel-away sheath is inserted under fluoroscopic guidance, and the dilator is removed. A 24 cm tip-to-cuff Cannon catheter is then  advanced over the wire through the sheath, with fluoroscopic verification that the tips are in the atrium. The sheath and then the wire are removed. The catheter is positioned to appropriate tip location, and the catheter is then approximated to the chest wall. A small counterincision is created, and the tunneling device is then utilized to fashion a subcutaneous pathway. Catheter is then pulled through the tunnel. Hub assembly is connected. Both lumens aspirate and flush easily, and 5000 units of heparin per lumen is instilled. The catheter is secured to the skin of the chest wall with an 0 silk. The neck counterincision is closed with 4-0 Monocryl and Dermabond. Fluoroscopic imaging demonstrates the catheter has the tips at the atriocaval junction with a smooth contour, free of kinks. There are no immediate complications.   ____________________________ Katha Cabal, MD ggs:lb D: 10/08/2013 08:12:26 ET T: 10/08/2013 08:31:32 ET JOB#: [id number]  cc: Katha Cabal, MD, <Dictator> Katha Cabal MD ELECTRONICALLY SIGNED 10/26/2013 17:17

## 2015-03-11 NOTE — Op Note (Signed)
PATIENT NAME:  [name], [name] MR#:  [id number] DATE OF BIRTH:  1951/04/07  DATE OF PROCEDURE:  01/06/2013  PREOPERATIVE DIAGNOSES: 1.  Complication arteriovenous dialysis device with inability to cannulate right brachiobasilic fistula.  2.  End-stage renal disease, requiring hemodialysis.  3.  History of multiple failed accesses in the past.  4.  Morbid obesity.   POSTOPERATIVE DIAGNOSES: 1.  Complication arteriovenous dialysis device with inability to cannulate right brachiobasilic fistula.  2.  End-stage renal disease, requiring hemodialysis.  3.  History of multiple failed accesses in the past.  4.  Morbid obesity.   PROCEDURES PERFORMED: 1.  Contrast injection right arm brachiobasilic fistula.  2.  Percutaneous transluminal angioplasty and stent placement using a FLAIR stent, venous cannulation zone right brachiobasilic fistula.   SURGEON: Katha Cabal, MD   SEDATION: Versed 3 mg plus fentanyl 100 mcg administered IV. Continuous ECG, pulse oximetry and cardiopulmonary monitoring was performed throughout the entire procedure by the interventional radiology nurse. Total sedation time is 1 hour.   ACCESS: A 7-French sheath right arm brachiobasilic fistula at the level of the arterial anastomosis.   CONTRAST USED: Isovue 60 mL.   FLUORO TIME: 6.6 minutes.   INDICATIONS: The patient is a 64 year old woman who is currently maintained on hemodialysis via a nontransposed brachiobasilic fistula. She has had 1 stent placed in the past but today at dialysis they were unable to cannulate her and, in fact, were pulling clots from the needles. She has, therefore, been referred to [place] for evaluation and treatment to maintain her appropriate dialysis access. Risks and benefits were reviewed with the patient. All questions answered. The patient agrees to proceed.   DESCRIPTION OF PROCEDURE: The patient is taken to special procedures and placed in the supine position with her right arm extended  palm upward. Right arm is prepped and draped in sterile fashion. After adequate sedation is achieved, the fistula is localized by palpation. It is significantly pulsatile and very difficult to determine whether this is the fistula versus the artery. Access is obtained using a micropuncture needle. Ultrasound was not utilized. Microwire followed by microsheath, J-wire followed by a 6-French sheath. Hand injection of contrast demonstrated that the access site was actually into the artery directly at the level of the arterial anastomosis. This was confirmed by advancing a Glidewire and then a Kumpe catheter up to the level of the axillary artery and then doing an injection to allow for better visualization. Having identified this and knowing that the sheath would be very difficult to negotiate into the fistula from this angle, two 0.18wires were then introduced and an 0.18 platinum plus which was advanced up to the level of the axillary artery and an 0.18 gold-tipped glide. The sheath was then pulled back popping contrast and once the sheath was in position it was angled steeply and with several manipulations the Glidewire negotiated into the fistula. Once the Glidewire wire was advanced far into the fistula, the Platinum Plus wire was removed and the sheath was then advanced over the Glidewire, now seated well within the fistula. At this point, completion imaging of the fistula was then obtained including the central veins. The area of problem, the venous cannulation site, demonstrates multiple small pseudoaneurysms and a high-grade narrowing of greater than 60% to 70%. Likely this narrowing is responsible for the pulsatility of the fistula as the central veins all appear widely patent.   Then 3000 units of heparin, Kumpe catheter is advanced over the wire and the  Glidewire exchanged for an Amplatz superstiff wire. Initially an 8 x 70 FLAIR stent is advanced over the wire encompassing the area of stenosis. This is  then post dilated with an 8 x 4 [place] balloon to 18 atmospheres. A second stent bridging the previously-placed Viabahn and the newly-placed FLAIR stent was also added and this was also postdilated to 8 mm using the Dorado balloon. Followup demonstrated wide patency of the stents, wide patency of the fistula with rapid flow of contrast into the central venous system by clinical exam.  The thrill is now much more prominent and the pulsatility of the entire system is much less.   Wire was then reintroduced, a Perclose device advanced over the wire and exchanged for the sheath and then the Perclose was deployed without difficulty. Pressure was held for 5 minutes and subsequently the skin incision was closed with an interrupted 4-0 Monocryl. The patient tolerated the procedure well and there were no immediate complications.   INTERPRETATION: Initially images of the arterial system from the axillary level down to the anastomosis demonstrate a rather large and widely patent brachial artery. The arterial anastomosis of the fistula is rather large and widely patent. Although somewhat tortuous, the proximal several centimeters of the fistula are widely patent and the previously-placed Viabahn stent is widely patent. There is no evidence of any significant restenosis whatsoever. Just beyond this stent, moving more proximally on the arm in the zone that would equate to the venous cannulation site, there are multiple small pseudoaneurysms. There is rather dramatic narrowing. After approximately 6 cm, there is a confluence of several veins. There is 1 large vein that is in line and continues to fill the axillary vein. Axillary, subclavian, innominate and superior vena cava are widely patent. Following angioplasty and stent placement, there is now an in-line flow through this 1 straight dominant vein with significant improvement in the overall clinical exam of his fistula.   SUMMARY: Successful angioplasty stent placement  of a nontransposed brachiobasilic fistula so that it can be continued for use as dialysis access.    ____________________________ Katha Cabal, MD ggs:cs D: 01/06/2013 15:41:00 ET T: 01/06/2013 16:03:56 ET JOB#: [id number]  cc: Katha Cabal, MD, <Dictator> Fran Lowes, MD Katha Cabal MD ELECTRONICALLY SIGNED 01/13/2013 11:23

## 2015-03-12 NOTE — Op Note (Signed)
PATIENT NAME:  [name], [name] MR#:  [id number] DATE OF BIRTH:  11-06-1951  DATE OF PROCEDURE:  02/01/2014  PREOPERATIVE DIAGNOSES:  1.  End-stage renal disease.  2.  Poorly functioning right brachiobasilic AV fistula.  3.  Hypertension.   POSTOPERATIVE DIAGNOSES: 1.  End-stage renal disease.  2.  Poorly functioning right brachiobasilic AV fistula.  3.  Hypertension.   PROCEDURES: 1.  Ultrasound guidance for vascular access to right arm arteriovenous fistula.  2.  Right upper extremity fistulogram and central venogram.  3.  Percutaneous transluminal angioplasty of basilic vein/axillary vein confluence with 8 mm diameter angioplasty balloon.  4.  Percutaneous transluminal angioplasty of right subclavian vein/innominate vein/superior vena cava with 10 mm diameter angioplasty balloon.   SURGEON: Algernon Huxley, MD  ANESTHESIA: Local with moderate conscious sedation.   ESTIMATED BLOOD LOSS: 25 mL.   INDICATION FOR PROCEDURE: A 64 year old female with end-stage renal disease. Her right brachiobasilic AV fistula is having difficulties with recent access.  The flows are down and noninvasive study showed some stenosis near the axillary vein beyond the previously placed stents within the AV fistula. Fistulogram was performed for further evaluation and potential intervention. The risks and benefits were discussed. Informed consent was obtained.   DESCRIPTION OF PROCEDURE: The patient was brought to the vascular suite. The right upper extremity was sterilely prepped and draped and a sterile surgical field was created. I  accessed the fistula just between the anastomosis and the previously placed stents. Under ultrasound guidance without difficulty with a micropuncture needle, a micropuncture wire and sheath were then placed. Imaging was performed demonstrating what appeared to be an irregularity and some moderate stenosis just beyond the stents in the basilic vein near the confluence to the axillary vein.  Centrally there was some sluggish flow and increased collaterals around the left jugular catheter in the innominate and SVC. The patient was given 3000 units of intravenous heparin. A 6 French sheath was placed over a Magic torque wire and I crossed the lesion without difficulty, confirming intraluminal flow in the right atrium with a Kumpe catheter.   I then replaced the Magic torque wire, parked this in the inferior vena cava. The central venous stenosis was treated with a 10 mm diameter angioplasty balloon and there was a significant waist right at the junction of the innominate and superior vena cava where the other catheter crossed.  I ballooned a wide area including the subclavian vein and innominate vein down to the superior vena cava and this was the tightest waist. An 8 mm diameter angioplasty balloon was then selected for the basilic vein and axillary vein confluence going back into the previously placed stents and down into the axillary vein. A waist was taken in this location and the stenosis appeared to be in the 50% to 60% range whereas the central venous stenosis was tighter, probably in the 80% range. With the balloon inflated in the basilic vein, imaging was performed to opacify the arterial anastomosis which was widely patent. Completion angiogram showed improvement with less than 30% stenosis in the basilic/axillary vein. The central venous flow was a little more brisk with less collaterals, although it was a little difficult to discern the degree of stenosis with the catheter still in place. At this point I elected to terminate the procedure. The sheath was removed and 4-0 Monocryl purse-string suture was placed and pressure was held. Sterile dressing was placed. The patient tolerated the procedure well and was taken to the recovery room  in stable condition.     ____________________________ Algernon Huxley, MD jsd:mk D: 02/01/2014 13:12:56 ET T: 02/01/2014 21:14:05  ET JOB#: [id number]  cc: Algernon Huxley, MD, <Dictator> Algernon Huxley MD ELECTRONICALLY SIGNED 02/22/2014 9:43

## 2015-03-12 NOTE — Op Note (Signed)
PATIENT NAME:  [name], BARNWELL MR#:  [id number] DATE OF BIRTH:  08/26/1951  DATE OF PROCEDURE:  02/22/2014  PREOPERATIVE DIAGNOSIS: End-stage renal disease with functional permanent dialysis access and no longer needing PermCath.   POSTOPERATIVE DIAGNOSIS: End-stage renal disease with functional permanent dialysis access and no longer needing PermCath.   PROCEDURE: Removal of left jugular PermCath.   SURGEON: Melvyn Neth, PA-C  ANESTHESIA: Local.   ESTIMATED BLOOD LOSS: Minimal.   INDICATION FOR THE PROCEDURE: The patient is a 64 year old female with end-stage renal disease. Her access is functional and she no longer needs a PermCath; therefore, this will be removed.   DESCRIPTION OF THE PROCEDURE: The patient is brought to the vascular interventional radiology area and positioned supine. The left neck and chest and existing catheter were sterilely prepped and draped and a sterile surgical field was created. The area was locally anesthetized copiously with 1% lidocaine. Hemostats were used to help dissect out the cuff. An 11 blade was used to transect the fibrous sheath connected to the cuff. The catheter was then removed in its entirety without difficulty with gentle traction. Pressure was held at the base of the neck. Sterile dressing was placed. The patient tolerated the procedure well.  ____________________________ Marin Shutter. Syncere Eble, PA-C cnh:sb D: 02/22/2014 13:12:07 ET T: 02/22/2014 14:24:37 ET JOB#: [id number]  cc: Marin Shutter. Lourdes Kucharski, PA-C, <Dictator> [place] PA ELECTRONICALLY SIGNED 02/24/2014 12:04

## 2015-03-13 NOTE — Op Note (Signed)
PATIENT NAME:  [name], [name] MR#:  [id number] DATE OF BIRTH:  03/09/1951  DATE OF PROCEDURE:  05/05/2012  PREOPERATIVE DIAGNOSES:  1. End-stage renal disease.  2. Nonfunctional right brachiobasilic AV fistula.  3. Hypertension.  4. Coronary disease.   POSTOPERATIVE DIAGNOSES:  1. End-stage renal disease.  2. Nonfunctional right brachiobasilic AV fistula.  3. Hypertension.  4. Coronary disease.   PROCEDURES:  1. Ultrasound guidance for vascular access to right brachiobasilic AV fistula.  2. Right upper extremity fistulogram and central venogram.  3. Percutaneous transluminal angioplasty of stenosis in basilic vein as it enters the deep venous system with 7 and 8 mm diameter angioplasty balloon.   SURGEON: Algernon Huxley, MD  ANESTHESIA: Local with moderate conscious sedation.   ESTIMATED BLOOD LOSS: Minimal.   CONTRAST USED: 25 mL Visipaque.   FLUOROSCOPY TIME: Approximately 2 minutes.   INDICATION FOR PROCEDURE: 64 year old African American female with end-stage renal disease. Her fistula was created several weeks ago and transposed. This has not become useful for access and a noninvasive study showed stenosis. We are planning fistulogram for further evaluation and possible treatment. Risks and benefits were discussed. Informed consent was obtained.   DESCRIPTION OF PROCEDURE: Patient brought to the vascular interventional radiology suite. Right upper extremity sterilely prepped and draped, a sterile surgical field was created. The fistula was then accessed under direct ultrasound guidance a few centimeters beyond the anastomosis with a micropuncture needle and permanent image was recorded. Micropuncture wire and sheath were placed. We upsized to a 6 [place] sheath. Imaging showed a high-grade stenosis in the basilic vein at the swing point as it transitioned down to the deep venous system. This was in the 80% to 90% range. The remainder of the central venous circulation was patent. I  crossed the lesion without difficulty with a Magic torque wire, inflated a 7 mm diameter angioplasty balloon. While the balloon was inflated an angiogram was performed to evaluate the arterial anastomosis which was widely patent. Following this I upsized to an 8 mm diameter angioplasty balloon and the completion angiographic result showed excellent flow with only approximately 10% to 20% residual stenosis and an excellent thrill within the AV fistula. 4-0 Monocryl pursestring suture was placed around the sheath exit site. Sheath was removed. Pressure was held. Sterile dressing was placed. The patient tolerated procedure well.   ____________________________ Algernon Huxley, MD jsd:cms D: 05/05/2012 09:04:16 ET T: 05/05/2012 10:27:46 ET JOB#: [id number]  cc: Algernon Huxley, MD, <Dictator> Algernon Huxley MD ELECTRONICALLY SIGNED 05/08/2012 9:06

## 2015-03-13 NOTE — Op Note (Signed)
PATIENT NAME:  [name], [name] MR#:  [id number] DATE OF BIRTH:  09/05/1951  DATE OF PROCEDURE:  11/28/2011  PREOPERATIVE DIAGNOSES: End-stage renal disease with multiple previous failed dialysis accesses.   POSTOPERATIVE DIAGNOSIS: End-stage renal disease with multiple previous failed dialysis accesses.   PROCEDURE PERFORMED:  Right brachiobasilic AV fistula creation.   SURGEON: Algernon Huxley, MD   ANESTHESIA: General.   ESTIMATED BLOOD LOSS: Approximately 25 mL.   INDICATION FOR PROCEDURE: This is a 64 year old African American female with multiple failed previous dialysis accesses. She was referred to me for access creation. She had what appeared to be an adequate basilic vein in the right arm, and right brachiobasilic AV fistula was recommended. The risks and benefits were discussed. Informed consent was obtained.   DESCRIPTION OF PROCEDURE: The patient was brought to the operative suite, and after an adequate level of general anesthesia was attained, the right upper extremity was sterilely prepped and draped and a sterile surgical field was created. A small transverse incision was created just above the antecubital fossa, and the palpable brachial artery and then the basilic vein were dissected out. The basilic vein was marked for orientation. The artery was encircled with vessel loops proximally and distally. The patient was given 3000 units of intravenous heparin for systemic anticoagulation. Control was pulled up on the vessel loops. An anterior arteriotomy was created with an 11 blade and extended with Potts scissors. The vein was then ligated distally, cut and beveled to an appropriate length to match the arteriotomy; and anastomosis was created with a running 6-0 Prolene suture in the usual fashion. The vessel was flushed and de-aired prior to release of control. On release, there were significant vasospasm in the vein that was treated with topical papaverine. Two 6-0 Prolene patch sutures  were used for hemostasis, and hemostasis was complete. The wound was irrigated and Surgicel was placed. I then closed the wound with a running 3-0 Vicryl and a 4-0 Monocryl. Dermabond was placed as a dressing.  The patient was taken to the recovery room in stable condition having tolerated the procedure well with a good thrill within her AV fistula.   ____________________________ Algernon Huxley, MD jsd:cbb D: 11/28/2011 13:48:03 ET T: 11/28/2011 15:33:56 ET JOB#: [id number]  cc: Algernon Huxley, MD, <Dictator> Algernon Huxley MD ELECTRONICALLY SIGNED 12/17/2011 9:14

## 2015-03-13 NOTE — Op Note (Signed)
PATIENT NAME:  [name], [name] MR#:  [id number] DATE OF BIRTH:  [birth year]  DATE OF PROCEDURE:  01/30/2012  PREOPERATIVE DIAGNOSES:  1. End-stage renal disease with non-transposed right brachiobasilic AV fistula.  2. Coronary disease.  3. Hypertension.   POSTOPERATIVE DIAGNOSES: 1. End-stage renal disease with non-transposed right brachiobasilic AV fistula.  2. Coronary disease.  3. Hypertension.   PROCEDURE: Revision/transposition of right brachiobasilic AV fistula.   SURGEON: Algernon Huxley, MD   ANESTHESIA: General.   ESTIMATED BLOOD LOSS: Approximately 50 mL.   INDICATION FOR PROCEDURE: This is a 64 year old African American female with end-stage renal disease. She has had a first stage of a right brachiobasilic AV fistula. This now needs to be transposed and superficialized so that it may be useful for dialysis. Risks and benefits were discussed. Informed consent was obtained.   DESCRIPTION OF PROCEDURE: The patient is brought to the operative suite and after an adequate level of general endotracheal anesthesia was obtained, her right upper extremity was sterilely prepped and draped and a sterile surgical field was created. An incision was created starting at the antecubital fossa at the previously placed anastomosis and then sequentially extended dissecting out the basilic vein until it emptied into the brachial and axillary vein up near the axillary crease. Several branches were ligated and divided between silk ties and this was dissected out over its entirety. It was marked for orientation. We then tunneled from the original anastomosis site to the junction to the brachial axillary vein and brought the vein out in a correct orientation. The patient was heparinized systemically. A clamp was placed at the base of the anastomosis and the vein was divided prior to tunneling. We then flushed the vessel with heparinized saline and created anastomosis in an end-to-end fashion parachuting two 6-0  Prolene sutures and a single 6-0 Prolene patch suture was used for hemostasis. Vessel was flushed and de-aired prior to release of control. On release of control there was a nice thrill within the AV fistula and it did not appear to have any kink or twisting. The wound was then irrigated. Surgicel and Evicyl topical hemostatic agents were placed and hemostasis was complete. The wound was then closed with three interrupted running 3-0 Vicryl and a 4-0 Monocryl. Dermabond was placed as a dressing. The patient tolerated the procedure well and was taken to the recovery room in stable condition.   ____________________________ Algernon Huxley, MD jsd:drc D: 01/30/2012 16:57:29 ET T: 01/30/2012 17:20:26 ET JOB#: [id number]  cc: Algernon Huxley, MD, <Dictator> Algernon Huxley MD ELECTRONICALLY SIGNED 02/01/2012 16:26

## 2015-03-16 NOTE — Op Note (Signed)
PATIENT NAME:  [name], [name] MR#:  [id number] DATE OF BIRTH:  Jan 15, 1951  DATE OF PROCEDURE:  11/17/2014  PREOPERATIVE DIAGNOSES:  1.  End-stage renal disease.  2.  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  3.  Multiple previous failed dialysis access catheters.   POSTOPERATIVE DIAGNOSES: 1.  End-stage renal disease.  2.  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  3.  Multiple previous failed dialysis access catheters.  4.  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  5.  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  6.  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  PROCEDURE: 1.  Ultrasound guidance for vascular access to right femoral artery.  2.  Catheter placement to right radial artery from right femoral approach.  3.  Thoracic aortogram and selective right upper extremity angiogram.  4.  Percutaneous transluminal angioplasty of right radial artery with 3 mm diameter angioplasty balloon.  5.  Viabahn covered stent placement to distal brachial artery aneurysm with a 6 mm diameter by 5 cm in length Viabahn covered stent.  6.  Viabahn covered stent placement to more proximal brachial artery pseudoaneurysm near fistula anastomosis with 7 mm diameter by 10 cm length Viabahn covered stent.  7.  StarClose closure device, right femoral artery.  8.  Ultrasound guidance for vascular access to left jugular vein.  9.  Catheter placement into inferior vena cava from left jugular vein.  10.  Jugular venogram and superior venacavogram.  11.  Percutaneous transluminal angioplasty of innominate vein and superior vena cava with 8 mm diameter angioplasty balloon.  12.  Ultrasound guidance for vascular access to left femoral vein.  13.  Placement of 43 cm tipped cuffed tunneled hemodialysis catheter, left femoral vein, with the use of fluoroscopic guidance.   SURGEON:  Algernon Huxley, MD   ANESTHESIA: Local with moderate conscious sedation.   ESTIMATED BLOOD LOSS: 100 mL.   CONTRAST: 110 mL of contrast were used.   FLUOROSCOPY TIME: About 16 minutes were used.   INDICATION FOR PROCEDURE: This is a 64 year old female with end-stage renal disease and very difficult dialysis access. She has had multiple failed previous accesses and had catheters for long periods of time. She has a right brachiobasilic AV fistula and has a large pseudoaneurysm that has enlarged over the past 2 months. She was seen in the office last week and this was over 7 cm in diameter, and very symptomatic. She is being brought in for treatment of this. It was discussed if we could do this with saving her fistula we would, but it was highly likely we would have to cover her fistula to treat the pseudoaneurysm, in which case she would need a PermCath. The risks and benefits were discussed. Informed consent was obtained.   DESCRIPTION OF THE PROCEDURE: The patient is brought to the vascular suite. Groins were shaved and prepped, and a sterile surgical field was created. The right femoral artery was visualized with ultrasound and found to be widely patent. It was then accessed under direct ultrasound guidance without difficulty with a Seldinger needle, and a permanent image was recorded. J-wire and a 5-French sheath were then placed. Pigtail catheter was placed into the ascending aorta and an LAO projection thoracic aortogram was performed. This demonstrated bovine configuration of the great vessels without significant stenosis, but moderate amounts of tortuosity. A Headhunter catheter was used to cannulate the innominate artery and we changed  to an RAO projection, and we were then able to pass into the right subclavian artery. From there,  imaging was performed showing a very tortuous subclavian, axillary, and brachial artery without significant stenosis. The pseudoaneurysm was at the fistula anastomosis  and was very large. The majority of the flow either went into the pseudoaneurysm or into the fistula, and very poor distal flow was seen initially. The catheter was then advanced to the brachial artery just beyond the fistula anastomosis and pseudoaneurysm. Imaging was performed which showed a moderate-size aneurysm of the brachial artery distal and just at the antecubital fossa. This was likely from her previous AV graft that has failed. Beyond this there was significant radial and ulnar disease. The radial artery had high-grade stenosis proximally and some more mild to moderate stenosis in the mid to distal segments of the radial artery. The ulnar artery appeared chronically occluded. This created a much more complex situation, but I felt treatment was still warranted from an endovascular approach. It was clear we were not going to be able to save the fistula at this point.  I gave the patient 3000 units of intravenous heparin, exchanged for a 7-French shuttle sheath over a Terumo Advantage wire. I actually got a 60 CXI catheter and parked this in the radial artery distally to get the radial artery images and give [place] good purchase to get the sheath placed. Once the sheath was in place with the CXI catheter in the distal radial artery, I exchanged for an 0.018 wire and we performed our interventions over this. The radial artery was treated with a 3 mm diameter angioplasty balloon from the origin to the mid to distal radial artery. This resulted in improved flow in the radial artery. However, prior to stent placement there were still significant amounts of the blood flow either going in the fistula or into the pseudoaneurysm. The caliber of the artery changed, and these were separate and distinct aneurysms, but they would be bridged and the stents would come together to help exclude both aneurysms. To treat the more distal aneurysm, a 6 mm diameter by 5 cm in length Viabahn covered stent was selected. This was  taken to the brachial bifurcation. Again, we had a wire access in the radial artery, which was the flow distally, and we did not take the stent into the radial artery but just to the distal brachial artery. The distal stent terminated about halfway between the 2 aneurysms in the mid brachial artery. A larger stent was required for the more proximal and much larger aneurysm; this would cover the origin of the fistula. A 7 mm diameter by 10 cm length covered stent was used for this aneurysm and this was post dilated with a 6 mm balloon. The distal stent was post dilated with a 5 mm balloon. Completion angiogram following this showed complete exclusion of the fistula and the pseudoaneurysm, with no blush of contrast into the pseudoaneurysm. Both stents were widely patent and the radial artery distal had better flow. At this point, I felt we had successfully excluded the aneurysms, and I removed the shuttle sheath.  StarClose closure device was deployed in the right femoral artery with excellent hemostatic result. I then turned my attention to PermCath placement. Ultrasound failed to visualize a patent right jugular vein in the neck, so this I started by imaging the left jugular vein. In the neck the jugular vein appeared patent, although it did not fully compress. I was able to access the  left jugular vein under direct ultrasound guidance without difficulty with a micropuncture needle. Micropuncture wire was then placed which traversed with some difficulty. A permanent image was recorded from the access. A micropuncture sheath was placed in the jugular vein and imaging was performed which showed a near-occlusive high-grade stenosis in the left innominate vein tracking into the superior vena cava. I was able to navigate through this with a moderate amount of difficulty with a Glidewire and a Kumpe catheter, and advance the Kumpe catheter into the SVC, through the atrium, and into the IVC. I then exchanged for an  Advantage wire. Over the Advantage wire we placed a 6-French sheath. An 8 mm diameter by 8 cm length angioplasty balloon was inflated in the innominate vein and superior vena cava. Tight narrowing was seen throughout the innominate vein which resolved with angioplasty. Over this I was then able to advance a dilator. The peel-away sheath would not track all the way down to the superior vena cava, but I attempted to place the catheter through this but was unsuccessful. In this process, due to the marked tortuosity from the left side and the stenosis, we lost our wire access but I felt that this attempt would not be fruitful and elected to abandon our approach here. Pressure was held and Monocryl sutures were placed at the jugular incision.  I then turned my attention to the left femoral vein. A femoral PermCath was placed. I selected, using fluoroscopic guidance, a 43 cm tipped cuffed tunneled hemodialysis catheter. The femoral vein was accessed without difficulty with a Seldinger needle and a permanent image was recorded. A J-wire was placed. I dilated up to the peel-away sheath, tunneled about 5 cm laterally and distal in the thigh, and tunneled from that counterincision to the access site with the 43 cm catheter. It was then placed through the peel-away sheath and the peel-away sheath was removed. The catheter tip was parked in an excellent location in the retrohepatic vena cava. It withdrew blood well and flushed easily with heparinized saline, and a concentrated heparin solution was placed. It was secured to the leg with 2 Prolene sutures. A 4-0 Monocryl pursestring suture was placed around the exit site and 4-0 Monocryl was used to close the access site. Sterile dressings were placed and the patient was awakened from anesthesia and taken to the recovery room in stable condition, having tolerated the procedure well.    ____________________________ Algernon Huxley, MD jsd:ST D: 11/17/2014 12:47:19  ET T: 11/17/2014 14:57:44 ET JOB#: [id number]  cc: Algernon Huxley, MD, <Dictator> Algernon Huxley MD ELECTRONICALLY SIGNED 11/22/2014 14:14

## 2015-03-17 ENCOUNTER — Other Ambulatory Visit: Payer: Self-pay | Admitting: Vascular Surgery

## 2015-03-20 NOTE — Op Note (Signed)
PATIENT NAME:  [name], [name] MR#:  [id number] DATE OF BIRTH:  [birth year]  DATE OF PROCEDURE:  01/26/2015  PREOPERATIVE DIAGNOSES:  1.  End-stage renal disease.  2.  Multiple failed dialysis accesses.  3.  Right brachial artery aneurysm.  4.  Hypertension.   POSTOPERATIVE DIAGNOSES:  1.  End-stage renal disease.  2.  Multiple failed dialysis accesses.  3.  Right brachial artery aneurysm.  4.  Hypertension.  PROCEDURE PERFORMED: Right thigh arteriovenous femoral artery to femoral vein loop graft with 7 mm Artegraft.   SURGEON: Algernon Huxley, MD   ANESTHESIA: General.   ESTIMATED BLOOD LOSS: 350 mL.   INDICATION FOR PROCEDURE: A 64 year old female with end-stage renal disease has had multiple failed dialysis access attempts. She had a very large right brachial aneurysm, which was treated several weeks ago resulting in loss of the AV fistula at that site. She now needs permanent dialysis access and a right thigh AV graft is planned. Risks and benefits were discussed. Informed consent was obtained.   DESCRIPTION OF PROCEDURE: The patient is brought to the operative suite. After an adequate level of general anesthesia was obtained, the right thigh, groin, and lower abdomen were sterilely prepped and draped and a sterile surgical field was created. An oblique incision created at the inguinal crease and we dissected out the femoral artery and femoral vein. This was somewhat tedious due to thick cicatrix scar tissue. There was StarClose device significantly superior to the artery that created a dense scar reaction. I then dissected this out. A lateral arterial branch was ligated and divided between silk ties as were several venous branches. I got a vessel loop around the common femoral artery, superficial femoral artery, profunda femoris artery and a medial branch, and the vein was dissected out enough to be able to be controlled with a partial occlusion clamp. The most curved tunneler was then brought  onto the field. A small counter incision was created in the anterior thigh and we dissected we tunneled a 7 mm Artegraft. The patient was then heparinized with 3500 units of intravenous heparin. The arterial anastomosis was created. First, the artery was controlled with vessel loops and arteriotomy was created with an 11 blade and extended with Potts scissors. The Artegraft was then cut and beveled to an appropriate length to match arteriotomy. A 6-0 Prolene suture was then used to create a running anastomosis in the usual fashion and two 6-0 Prolene patch sutures were used for hemostasis. We flushed through the graft with excellent pulsatile inflow. I then prepared the vein for anastomosis, controlled with the Satinsky-type clamp and venotomy was created with an 11 blade and extended with Potts scissors. The graft and cut and beveled to an appropriate length to match the venotomy. Anastomosis was created with a running 6-0 Prolene suture in the usual fashion. The vessel was flushed and de-aired prior to releasing control. On release, there was excellent pulsatile flow through the graft. A 5-0 Prolene was used on a side branch of the vein and it was ligated distally. The wound was irrigated. Surgicel and Evicel topical hemostatic agents were placed. Hemostasis was complete. The wounds were then closed with 3-0 Vicryl and 4-0 Monocryl. Dermabond was placed as dressing. The patient was awakened from anesthesia and taken to the recovery room in stable condition, having tolerated the procedure well.    ____________________________ Algernon Huxley, MD jsd:bm D: 01/26/2015 17:01:54 ET T: 01/27/2015 00:05:42 ET JOB#: [id number]  cc: Algernon Huxley, MD, <  Dictator> Algernon Huxley MD ELECTRONICALLY SIGNED 01/27/2015 16:26

## 2015-03-21 ENCOUNTER — Ambulatory Visit
Admission: RE | Admit: 2015-03-21 | Discharge: 2015-03-21 | Disposition: A | Discharge status: Home / Self Care | Payer: Medicare Other | Source: Ambulatory Visit | Attending: Vascular Surgery | Admitting: Vascular Surgery

## 2015-03-21 ENCOUNTER — Other Ambulatory Visit: Payer: Self-pay | Admitting: Vascular Surgery

## 2015-03-21 DIAGNOSIS — I120 Hypertensive chronic kidney disease with stage 5 chronic kidney disease or end stage renal disease: Secondary | ICD-10-CM | POA: Diagnosis not present

## 2015-03-21 DIAGNOSIS — N186 End stage renal disease: Secondary | ICD-10-CM | POA: Insufficient documentation

## 2015-03-21 DIAGNOSIS — Z992 Dependence on renal dialysis: Principal | ICD-10-CM

## 2015-03-21 MED ORDER — HYDROMORPHONE HCL 1 MG/ML PO LIQD: 1.0000 mg | ORAL | Status: DC | PRN

## 2015-03-21 MED ORDER — ONDANSETRON HCL 4 MG PO TABS
4.0000 mg | ORAL_TABLET | Freq: Three times a day (TID) | ORAL | Status: DC | PRN
  Filled 2015-03-21: qty 1

## 2015-03-21 NOTE — Op Note (Signed)
NAMECELECIA, HOHLT                  ACCOUNT NO.:  [id number]  MEDICAL RECORD NO.:  [id number]  LOCATION:  I                            FACILITY:  [facility]  PHYSICIAN:  Algernon Huxley, MD        DATE OF BIRTH:  [birth year]  DATE OF PROCEDURE:  03/21/2015 DATE OF DISCHARGE:                              OPERATIVE REPORT   PREOPERATIVE DIAGNOSIS:  End-stage renal disease with functional permanent dialysis access.  POSTOPERATIVE DIAGNOSIS:  End-stage renal disease with functional permanent dialysis access.  PROCEDURE PERFORMED:  Removal of left femoral PermCath.  SURGEON:  Algernon Huxley, MD  ASSISTANT:  Hezzie Bump, P.A.-C.  ANESTHESIA:  Local.  BLOOD LOSS:  Minimal.  INDICATIONS FOR PROCEDURE:  This is a 64 year old female who has a functional permanent dialysis access, and we can now remove her catheter.  The risks and benefits are discussed.  Informed consent is obtained.  DESCRIPTION OF PROCEDURE:  The patient's left groin and existing catheter were sterilely prepped and draped, and a sterile surgical field was created.  The area was anesthetized with 1% lidocaine.  Her cuff was at the skin.  It dissected out easily and was removed from the surrounding fibrous connective tissue.  The catheter was then removed with gentle traction in its entirety.  Pressure was held, and sterile dressings were placed.  The patient tolerated the procedure well.          ______________________________ Algernon Huxley, MD     JSD/MEDQ  D:  03/21/2015  T:  03/21/2015  Job:  [id number]

## 2015-11-22 DIAGNOSIS — N2581 Secondary hyperparathyroidism of renal origin: Secondary | ICD-10-CM | POA: Diagnosis not present

## 2015-11-22 DIAGNOSIS — D509 Iron deficiency anemia, unspecified: Secondary | ICD-10-CM | POA: Diagnosis not present

## 2015-11-22 DIAGNOSIS — N186 End stage renal disease: Secondary | ICD-10-CM | POA: Diagnosis not present

## 2015-11-22 DIAGNOSIS — Z992 Dependence on renal dialysis: Secondary | ICD-10-CM | POA: Diagnosis not present

## 2015-11-22 DIAGNOSIS — D631 Anemia in chronic kidney disease: Secondary | ICD-10-CM | POA: Diagnosis not present

## 2015-11-24 DIAGNOSIS — D631 Anemia in chronic kidney disease: Secondary | ICD-10-CM | POA: Diagnosis not present

## 2015-11-24 DIAGNOSIS — N186 End stage renal disease: Secondary | ICD-10-CM | POA: Diagnosis not present

## 2015-11-24 DIAGNOSIS — D509 Iron deficiency anemia, unspecified: Secondary | ICD-10-CM | POA: Diagnosis not present

## 2015-11-24 DIAGNOSIS — Z992 Dependence on renal dialysis: Secondary | ICD-10-CM | POA: Diagnosis not present

## 2015-11-24 DIAGNOSIS — N2581 Secondary hyperparathyroidism of renal origin: Secondary | ICD-10-CM | POA: Diagnosis not present

## 2015-11-26 DIAGNOSIS — D509 Iron deficiency anemia, unspecified: Secondary | ICD-10-CM | POA: Diagnosis not present

## 2015-11-26 DIAGNOSIS — N186 End stage renal disease: Secondary | ICD-10-CM | POA: Diagnosis not present

## 2015-11-26 DIAGNOSIS — Z992 Dependence on renal dialysis: Secondary | ICD-10-CM | POA: Diagnosis not present

## 2015-11-26 DIAGNOSIS — D631 Anemia in chronic kidney disease: Secondary | ICD-10-CM | POA: Diagnosis not present

## 2015-11-26 DIAGNOSIS — N2581 Secondary hyperparathyroidism of renal origin: Secondary | ICD-10-CM | POA: Diagnosis not present

## 2015-11-29 DIAGNOSIS — D509 Iron deficiency anemia, unspecified: Secondary | ICD-10-CM | POA: Diagnosis not present

## 2015-11-29 DIAGNOSIS — N186 End stage renal disease: Secondary | ICD-10-CM | POA: Diagnosis not present

## 2015-11-29 DIAGNOSIS — N2581 Secondary hyperparathyroidism of renal origin: Secondary | ICD-10-CM | POA: Diagnosis not present

## 2015-11-29 DIAGNOSIS — D631 Anemia in chronic kidney disease: Secondary | ICD-10-CM | POA: Diagnosis not present

## 2015-11-29 DIAGNOSIS — Z992 Dependence on renal dialysis: Secondary | ICD-10-CM | POA: Diagnosis not present

## 2015-12-01 DIAGNOSIS — Z992 Dependence on renal dialysis: Secondary | ICD-10-CM | POA: Diagnosis not present

## 2015-12-01 DIAGNOSIS — N186 End stage renal disease: Secondary | ICD-10-CM | POA: Diagnosis not present

## 2015-12-01 DIAGNOSIS — D509 Iron deficiency anemia, unspecified: Secondary | ICD-10-CM | POA: Diagnosis not present

## 2015-12-01 DIAGNOSIS — N2581 Secondary hyperparathyroidism of renal origin: Secondary | ICD-10-CM | POA: Diagnosis not present

## 2015-12-01 DIAGNOSIS — D631 Anemia in chronic kidney disease: Secondary | ICD-10-CM | POA: Diagnosis not present

## 2015-12-03 DIAGNOSIS — D509 Iron deficiency anemia, unspecified: Secondary | ICD-10-CM | POA: Diagnosis not present

## 2015-12-03 DIAGNOSIS — N186 End stage renal disease: Secondary | ICD-10-CM | POA: Diagnosis not present

## 2015-12-03 DIAGNOSIS — N2581 Secondary hyperparathyroidism of renal origin: Secondary | ICD-10-CM | POA: Diagnosis not present

## 2015-12-03 DIAGNOSIS — Z992 Dependence on renal dialysis: Secondary | ICD-10-CM | POA: Diagnosis not present

## 2015-12-03 DIAGNOSIS — D631 Anemia in chronic kidney disease: Secondary | ICD-10-CM | POA: Diagnosis not present

## 2015-12-05 ENCOUNTER — Ambulatory Visit: Admit: 2015-12-05 | Payer: Self-pay | Admitting: Vascular Surgery

## 2015-12-05 SURGERY — A/V SHUNTOGRAM/FISTULAGRAM
Anesthesia: Moderate Sedation | Laterality: Right

## 2015-12-06 DIAGNOSIS — Z992 Dependence on renal dialysis: Secondary | ICD-10-CM | POA: Diagnosis not present

## 2015-12-06 DIAGNOSIS — D631 Anemia in chronic kidney disease: Secondary | ICD-10-CM | POA: Diagnosis not present

## 2015-12-06 DIAGNOSIS — D509 Iron deficiency anemia, unspecified: Secondary | ICD-10-CM | POA: Diagnosis not present

## 2015-12-06 DIAGNOSIS — N186 End stage renal disease: Secondary | ICD-10-CM | POA: Diagnosis not present

## 2015-12-06 DIAGNOSIS — N2581 Secondary hyperparathyroidism of renal origin: Secondary | ICD-10-CM | POA: Diagnosis not present

## 2015-12-08 DIAGNOSIS — N2581 Secondary hyperparathyroidism of renal origin: Secondary | ICD-10-CM | POA: Diagnosis not present

## 2015-12-08 DIAGNOSIS — D509 Iron deficiency anemia, unspecified: Secondary | ICD-10-CM | POA: Diagnosis not present

## 2015-12-08 DIAGNOSIS — Z992 Dependence on renal dialysis: Secondary | ICD-10-CM | POA: Diagnosis not present

## 2015-12-08 DIAGNOSIS — D631 Anemia in chronic kidney disease: Secondary | ICD-10-CM | POA: Diagnosis not present

## 2015-12-08 DIAGNOSIS — N186 End stage renal disease: Secondary | ICD-10-CM | POA: Diagnosis not present

## 2015-12-10 DIAGNOSIS — Z992 Dependence on renal dialysis: Secondary | ICD-10-CM | POA: Diagnosis not present

## 2015-12-10 DIAGNOSIS — D509 Iron deficiency anemia, unspecified: Secondary | ICD-10-CM | POA: Diagnosis not present

## 2015-12-10 DIAGNOSIS — D631 Anemia in chronic kidney disease: Secondary | ICD-10-CM | POA: Diagnosis not present

## 2015-12-10 DIAGNOSIS — N186 End stage renal disease: Secondary | ICD-10-CM | POA: Diagnosis not present

## 2015-12-10 DIAGNOSIS — N2581 Secondary hyperparathyroidism of renal origin: Secondary | ICD-10-CM | POA: Diagnosis not present

## 2015-12-13 DIAGNOSIS — D509 Iron deficiency anemia, unspecified: Secondary | ICD-10-CM | POA: Diagnosis not present

## 2015-12-13 DIAGNOSIS — Z992 Dependence on renal dialysis: Secondary | ICD-10-CM | POA: Diagnosis not present

## 2015-12-13 DIAGNOSIS — N2581 Secondary hyperparathyroidism of renal origin: Secondary | ICD-10-CM | POA: Diagnosis not present

## 2015-12-13 DIAGNOSIS — D631 Anemia in chronic kidney disease: Secondary | ICD-10-CM | POA: Diagnosis not present

## 2015-12-13 DIAGNOSIS — N186 End stage renal disease: Secondary | ICD-10-CM | POA: Diagnosis not present

## 2015-12-15 DIAGNOSIS — N2581 Secondary hyperparathyroidism of renal origin: Secondary | ICD-10-CM | POA: Diagnosis not present

## 2015-12-15 DIAGNOSIS — D631 Anemia in chronic kidney disease: Secondary | ICD-10-CM | POA: Diagnosis not present

## 2015-12-15 DIAGNOSIS — Z992 Dependence on renal dialysis: Secondary | ICD-10-CM | POA: Diagnosis not present

## 2015-12-15 DIAGNOSIS — N186 End stage renal disease: Secondary | ICD-10-CM | POA: Diagnosis not present

## 2015-12-15 DIAGNOSIS — D509 Iron deficiency anemia, unspecified: Secondary | ICD-10-CM | POA: Diagnosis not present

## 2015-12-17 DIAGNOSIS — N186 End stage renal disease: Secondary | ICD-10-CM | POA: Diagnosis not present

## 2015-12-17 DIAGNOSIS — D509 Iron deficiency anemia, unspecified: Secondary | ICD-10-CM | POA: Diagnosis not present

## 2015-12-17 DIAGNOSIS — Z992 Dependence on renal dialysis: Secondary | ICD-10-CM | POA: Diagnosis not present

## 2015-12-17 DIAGNOSIS — N2581 Secondary hyperparathyroidism of renal origin: Secondary | ICD-10-CM | POA: Diagnosis not present

## 2015-12-17 DIAGNOSIS — D631 Anemia in chronic kidney disease: Secondary | ICD-10-CM | POA: Diagnosis not present

## 2015-12-20 DIAGNOSIS — N186 End stage renal disease: Secondary | ICD-10-CM | POA: Diagnosis not present

## 2015-12-20 DIAGNOSIS — D509 Iron deficiency anemia, unspecified: Secondary | ICD-10-CM | POA: Diagnosis not present

## 2015-12-20 DIAGNOSIS — Z992 Dependence on renal dialysis: Secondary | ICD-10-CM | POA: Diagnosis not present

## 2015-12-20 DIAGNOSIS — N2581 Secondary hyperparathyroidism of renal origin: Secondary | ICD-10-CM | POA: Diagnosis not present

## 2015-12-20 DIAGNOSIS — D631 Anemia in chronic kidney disease: Secondary | ICD-10-CM | POA: Diagnosis not present

## 2015-12-22 DIAGNOSIS — Z992 Dependence on renal dialysis: Secondary | ICD-10-CM | POA: Diagnosis not present

## 2015-12-22 DIAGNOSIS — N2581 Secondary hyperparathyroidism of renal origin: Secondary | ICD-10-CM | POA: Diagnosis not present

## 2015-12-22 DIAGNOSIS — N186 End stage renal disease: Secondary | ICD-10-CM | POA: Diagnosis not present

## 2015-12-22 DIAGNOSIS — D631 Anemia in chronic kidney disease: Secondary | ICD-10-CM | POA: Diagnosis not present

## 2015-12-22 DIAGNOSIS — D509 Iron deficiency anemia, unspecified: Secondary | ICD-10-CM | POA: Diagnosis not present

## 2015-12-24 DIAGNOSIS — Z992 Dependence on renal dialysis: Secondary | ICD-10-CM | POA: Diagnosis not present

## 2015-12-24 DIAGNOSIS — N2581 Secondary hyperparathyroidism of renal origin: Secondary | ICD-10-CM | POA: Diagnosis not present

## 2015-12-24 DIAGNOSIS — D509 Iron deficiency anemia, unspecified: Secondary | ICD-10-CM | POA: Diagnosis not present

## 2015-12-24 DIAGNOSIS — N186 End stage renal disease: Secondary | ICD-10-CM | POA: Diagnosis not present

## 2015-12-24 DIAGNOSIS — D631 Anemia in chronic kidney disease: Secondary | ICD-10-CM | POA: Diagnosis not present

## 2015-12-27 DIAGNOSIS — N186 End stage renal disease: Secondary | ICD-10-CM | POA: Diagnosis not present

## 2015-12-27 DIAGNOSIS — N2581 Secondary hyperparathyroidism of renal origin: Secondary | ICD-10-CM | POA: Diagnosis not present

## 2015-12-27 DIAGNOSIS — D631 Anemia in chronic kidney disease: Secondary | ICD-10-CM | POA: Diagnosis not present

## 2015-12-27 DIAGNOSIS — Z992 Dependence on renal dialysis: Secondary | ICD-10-CM | POA: Diagnosis not present

## 2015-12-27 DIAGNOSIS — D509 Iron deficiency anemia, unspecified: Secondary | ICD-10-CM | POA: Diagnosis not present

## 2015-12-29 DIAGNOSIS — D509 Iron deficiency anemia, unspecified: Secondary | ICD-10-CM | POA: Diagnosis not present

## 2015-12-29 DIAGNOSIS — N186 End stage renal disease: Secondary | ICD-10-CM | POA: Diagnosis not present

## 2015-12-29 DIAGNOSIS — Z992 Dependence on renal dialysis: Secondary | ICD-10-CM | POA: Diagnosis not present

## 2015-12-29 DIAGNOSIS — D631 Anemia in chronic kidney disease: Secondary | ICD-10-CM | POA: Diagnosis not present

## 2015-12-29 DIAGNOSIS — N2581 Secondary hyperparathyroidism of renal origin: Secondary | ICD-10-CM | POA: Diagnosis not present

## 2015-12-31 DIAGNOSIS — N2581 Secondary hyperparathyroidism of renal origin: Secondary | ICD-10-CM | POA: Diagnosis not present

## 2015-12-31 DIAGNOSIS — N186 End stage renal disease: Secondary | ICD-10-CM | POA: Diagnosis not present

## 2015-12-31 DIAGNOSIS — Z992 Dependence on renal dialysis: Secondary | ICD-10-CM | POA: Diagnosis not present

## 2015-12-31 DIAGNOSIS — D509 Iron deficiency anemia, unspecified: Secondary | ICD-10-CM | POA: Diagnosis not present

## 2015-12-31 DIAGNOSIS — D631 Anemia in chronic kidney disease: Secondary | ICD-10-CM | POA: Diagnosis not present

## 2016-01-03 DIAGNOSIS — N186 End stage renal disease: Secondary | ICD-10-CM | POA: Diagnosis not present

## 2016-01-03 DIAGNOSIS — N2581 Secondary hyperparathyroidism of renal origin: Secondary | ICD-10-CM | POA: Diagnosis not present

## 2016-01-03 DIAGNOSIS — Z992 Dependence on renal dialysis: Secondary | ICD-10-CM | POA: Diagnosis not present

## 2016-01-03 DIAGNOSIS — D631 Anemia in chronic kidney disease: Secondary | ICD-10-CM | POA: Diagnosis not present

## 2016-01-03 DIAGNOSIS — D509 Iron deficiency anemia, unspecified: Secondary | ICD-10-CM | POA: Diagnosis not present

## 2016-01-05 DIAGNOSIS — N186 End stage renal disease: Secondary | ICD-10-CM | POA: Diagnosis not present

## 2016-01-05 DIAGNOSIS — N2581 Secondary hyperparathyroidism of renal origin: Secondary | ICD-10-CM | POA: Diagnosis not present

## 2016-01-05 DIAGNOSIS — Z992 Dependence on renal dialysis: Secondary | ICD-10-CM | POA: Diagnosis not present

## 2016-01-05 DIAGNOSIS — D509 Iron deficiency anemia, unspecified: Secondary | ICD-10-CM | POA: Diagnosis not present

## 2016-01-05 DIAGNOSIS — D631 Anemia in chronic kidney disease: Secondary | ICD-10-CM | POA: Diagnosis not present

## 2016-01-07 DIAGNOSIS — N2581 Secondary hyperparathyroidism of renal origin: Secondary | ICD-10-CM | POA: Diagnosis not present

## 2016-01-07 DIAGNOSIS — N186 End stage renal disease: Secondary | ICD-10-CM | POA: Diagnosis not present

## 2016-01-07 DIAGNOSIS — D509 Iron deficiency anemia, unspecified: Secondary | ICD-10-CM | POA: Diagnosis not present

## 2016-01-07 DIAGNOSIS — Z992 Dependence on renal dialysis: Secondary | ICD-10-CM | POA: Diagnosis not present

## 2016-01-07 DIAGNOSIS — D631 Anemia in chronic kidney disease: Secondary | ICD-10-CM | POA: Diagnosis not present

## 2016-01-10 DIAGNOSIS — Z992 Dependence on renal dialysis: Secondary | ICD-10-CM | POA: Diagnosis not present

## 2016-01-10 DIAGNOSIS — N2581 Secondary hyperparathyroidism of renal origin: Secondary | ICD-10-CM | POA: Diagnosis not present

## 2016-01-10 DIAGNOSIS — D509 Iron deficiency anemia, unspecified: Secondary | ICD-10-CM | POA: Diagnosis not present

## 2016-01-10 DIAGNOSIS — D631 Anemia in chronic kidney disease: Secondary | ICD-10-CM | POA: Diagnosis not present

## 2016-01-10 DIAGNOSIS — N186 End stage renal disease: Secondary | ICD-10-CM | POA: Diagnosis not present

## 2016-01-12 DIAGNOSIS — D631 Anemia in chronic kidney disease: Secondary | ICD-10-CM | POA: Diagnosis not present

## 2016-01-12 DIAGNOSIS — N2581 Secondary hyperparathyroidism of renal origin: Secondary | ICD-10-CM | POA: Diagnosis not present

## 2016-01-12 DIAGNOSIS — N186 End stage renal disease: Secondary | ICD-10-CM | POA: Diagnosis not present

## 2016-01-12 DIAGNOSIS — D509 Iron deficiency anemia, unspecified: Secondary | ICD-10-CM | POA: Diagnosis not present

## 2016-01-12 DIAGNOSIS — Z992 Dependence on renal dialysis: Secondary | ICD-10-CM | POA: Diagnosis not present

## 2016-01-14 DIAGNOSIS — Z992 Dependence on renal dialysis: Secondary | ICD-10-CM | POA: Diagnosis not present

## 2016-01-14 DIAGNOSIS — N2581 Secondary hyperparathyroidism of renal origin: Secondary | ICD-10-CM | POA: Diagnosis not present

## 2016-01-14 DIAGNOSIS — N186 End stage renal disease: Secondary | ICD-10-CM | POA: Diagnosis not present

## 2016-01-14 DIAGNOSIS — D509 Iron deficiency anemia, unspecified: Secondary | ICD-10-CM | POA: Diagnosis not present

## 2016-01-14 DIAGNOSIS — D631 Anemia in chronic kidney disease: Secondary | ICD-10-CM | POA: Diagnosis not present

## 2016-01-17 DIAGNOSIS — Z992 Dependence on renal dialysis: Secondary | ICD-10-CM | POA: Diagnosis not present

## 2016-01-17 DIAGNOSIS — N186 End stage renal disease: Secondary | ICD-10-CM | POA: Diagnosis not present

## 2016-01-17 DIAGNOSIS — N2581 Secondary hyperparathyroidism of renal origin: Secondary | ICD-10-CM | POA: Diagnosis not present

## 2016-01-17 DIAGNOSIS — D631 Anemia in chronic kidney disease: Secondary | ICD-10-CM | POA: Diagnosis not present

## 2016-01-17 DIAGNOSIS — D509 Iron deficiency anemia, unspecified: Secondary | ICD-10-CM | POA: Diagnosis not present

## 2016-01-19 DIAGNOSIS — N186 End stage renal disease: Secondary | ICD-10-CM | POA: Diagnosis not present

## 2016-01-19 DIAGNOSIS — N2581 Secondary hyperparathyroidism of renal origin: Secondary | ICD-10-CM | POA: Diagnosis not present

## 2016-01-19 DIAGNOSIS — D509 Iron deficiency anemia, unspecified: Secondary | ICD-10-CM | POA: Diagnosis not present

## 2016-01-19 DIAGNOSIS — Z992 Dependence on renal dialysis: Secondary | ICD-10-CM | POA: Diagnosis not present

## 2016-01-19 DIAGNOSIS — D631 Anemia in chronic kidney disease: Secondary | ICD-10-CM | POA: Diagnosis not present

## 2016-01-21 DIAGNOSIS — D509 Iron deficiency anemia, unspecified: Secondary | ICD-10-CM | POA: Diagnosis not present

## 2016-01-21 DIAGNOSIS — D631 Anemia in chronic kidney disease: Secondary | ICD-10-CM | POA: Diagnosis not present

## 2016-01-21 DIAGNOSIS — Z992 Dependence on renal dialysis: Secondary | ICD-10-CM | POA: Diagnosis not present

## 2016-01-21 DIAGNOSIS — N2581 Secondary hyperparathyroidism of renal origin: Secondary | ICD-10-CM | POA: Diagnosis not present

## 2016-01-21 DIAGNOSIS — N186 End stage renal disease: Secondary | ICD-10-CM | POA: Diagnosis not present

## 2016-01-23 DIAGNOSIS — Z6823 Body mass index (BMI) 23.0-23.9, adult: Secondary | ICD-10-CM | POA: Diagnosis not present

## 2016-01-23 DIAGNOSIS — I10 Essential (primary) hypertension: Secondary | ICD-10-CM | POA: Diagnosis not present

## 2016-01-23 DIAGNOSIS — N186 End stage renal disease: Secondary | ICD-10-CM | POA: Diagnosis not present

## 2016-01-23 DIAGNOSIS — Z992 Dependence on renal dialysis: Secondary | ICD-10-CM | POA: Diagnosis not present

## 2016-01-24 DIAGNOSIS — D631 Anemia in chronic kidney disease: Secondary | ICD-10-CM | POA: Diagnosis not present

## 2016-01-24 DIAGNOSIS — N2581 Secondary hyperparathyroidism of renal origin: Secondary | ICD-10-CM | POA: Diagnosis not present

## 2016-01-24 DIAGNOSIS — N186 End stage renal disease: Secondary | ICD-10-CM | POA: Diagnosis not present

## 2016-01-24 DIAGNOSIS — Z992 Dependence on renal dialysis: Secondary | ICD-10-CM | POA: Diagnosis not present

## 2016-01-24 DIAGNOSIS — D509 Iron deficiency anemia, unspecified: Secondary | ICD-10-CM | POA: Diagnosis not present

## 2016-01-26 DIAGNOSIS — D509 Iron deficiency anemia, unspecified: Secondary | ICD-10-CM | POA: Diagnosis not present

## 2016-01-26 DIAGNOSIS — D631 Anemia in chronic kidney disease: Secondary | ICD-10-CM | POA: Diagnosis not present

## 2016-01-26 DIAGNOSIS — N2581 Secondary hyperparathyroidism of renal origin: Secondary | ICD-10-CM | POA: Diagnosis not present

## 2016-01-26 DIAGNOSIS — N186 End stage renal disease: Secondary | ICD-10-CM | POA: Diagnosis not present

## 2016-01-26 DIAGNOSIS — Z992 Dependence on renal dialysis: Secondary | ICD-10-CM | POA: Diagnosis not present

## 2016-01-28 DIAGNOSIS — N186 End stage renal disease: Secondary | ICD-10-CM | POA: Diagnosis not present

## 2016-01-28 DIAGNOSIS — Z992 Dependence on renal dialysis: Secondary | ICD-10-CM | POA: Diagnosis not present

## 2016-01-28 DIAGNOSIS — D631 Anemia in chronic kidney disease: Secondary | ICD-10-CM | POA: Diagnosis not present

## 2016-01-28 DIAGNOSIS — D509 Iron deficiency anemia, unspecified: Secondary | ICD-10-CM | POA: Diagnosis not present

## 2016-01-28 DIAGNOSIS — N2581 Secondary hyperparathyroidism of renal origin: Secondary | ICD-10-CM | POA: Diagnosis not present

## 2016-01-31 DIAGNOSIS — Z992 Dependence on renal dialysis: Secondary | ICD-10-CM | POA: Diagnosis not present

## 2016-01-31 DIAGNOSIS — D631 Anemia in chronic kidney disease: Secondary | ICD-10-CM | POA: Diagnosis not present

## 2016-01-31 DIAGNOSIS — D509 Iron deficiency anemia, unspecified: Secondary | ICD-10-CM | POA: Diagnosis not present

## 2016-01-31 DIAGNOSIS — N186 End stage renal disease: Secondary | ICD-10-CM | POA: Diagnosis not present

## 2016-01-31 DIAGNOSIS — N2581 Secondary hyperparathyroidism of renal origin: Secondary | ICD-10-CM | POA: Diagnosis not present

## 2016-02-01 DIAGNOSIS — E7800 Pure hypercholesterolemia, unspecified: Secondary | ICD-10-CM | POA: Diagnosis not present

## 2016-02-01 DIAGNOSIS — I10 Essential (primary) hypertension: Secondary | ICD-10-CM | POA: Diagnosis not present

## 2016-02-02 DIAGNOSIS — N186 End stage renal disease: Secondary | ICD-10-CM | POA: Diagnosis not present

## 2016-02-02 DIAGNOSIS — Z992 Dependence on renal dialysis: Secondary | ICD-10-CM | POA: Diagnosis not present

## 2016-02-02 DIAGNOSIS — D631 Anemia in chronic kidney disease: Secondary | ICD-10-CM | POA: Diagnosis not present

## 2016-02-02 DIAGNOSIS — D509 Iron deficiency anemia, unspecified: Secondary | ICD-10-CM | POA: Diagnosis not present

## 2016-02-02 DIAGNOSIS — N2581 Secondary hyperparathyroidism of renal origin: Secondary | ICD-10-CM | POA: Diagnosis not present

## 2016-02-04 DIAGNOSIS — D631 Anemia in chronic kidney disease: Secondary | ICD-10-CM | POA: Diagnosis not present

## 2016-02-04 DIAGNOSIS — D509 Iron deficiency anemia, unspecified: Secondary | ICD-10-CM | POA: Diagnosis not present

## 2016-02-04 DIAGNOSIS — Z992 Dependence on renal dialysis: Secondary | ICD-10-CM | POA: Diagnosis not present

## 2016-02-04 DIAGNOSIS — N186 End stage renal disease: Secondary | ICD-10-CM | POA: Diagnosis not present

## 2016-02-04 DIAGNOSIS — N2581 Secondary hyperparathyroidism of renal origin: Secondary | ICD-10-CM | POA: Diagnosis not present

## 2016-02-07 DIAGNOSIS — Z992 Dependence on renal dialysis: Secondary | ICD-10-CM | POA: Diagnosis not present

## 2016-02-07 DIAGNOSIS — D509 Iron deficiency anemia, unspecified: Secondary | ICD-10-CM | POA: Diagnosis not present

## 2016-02-07 DIAGNOSIS — N186 End stage renal disease: Secondary | ICD-10-CM | POA: Diagnosis not present

## 2016-02-07 DIAGNOSIS — D631 Anemia in chronic kidney disease: Secondary | ICD-10-CM | POA: Diagnosis not present

## 2016-02-07 DIAGNOSIS — N2581 Secondary hyperparathyroidism of renal origin: Secondary | ICD-10-CM | POA: Diagnosis not present

## 2016-02-09 DIAGNOSIS — N186 End stage renal disease: Secondary | ICD-10-CM | POA: Diagnosis not present

## 2016-02-09 DIAGNOSIS — Z992 Dependence on renal dialysis: Secondary | ICD-10-CM | POA: Diagnosis not present

## 2016-02-09 DIAGNOSIS — N2581 Secondary hyperparathyroidism of renal origin: Secondary | ICD-10-CM | POA: Diagnosis not present

## 2016-02-09 DIAGNOSIS — D509 Iron deficiency anemia, unspecified: Secondary | ICD-10-CM | POA: Diagnosis not present

## 2016-02-09 DIAGNOSIS — D631 Anemia in chronic kidney disease: Secondary | ICD-10-CM | POA: Diagnosis not present

## 2016-02-11 DIAGNOSIS — D631 Anemia in chronic kidney disease: Secondary | ICD-10-CM | POA: Diagnosis not present

## 2016-02-11 DIAGNOSIS — Z992 Dependence on renal dialysis: Secondary | ICD-10-CM | POA: Diagnosis not present

## 2016-02-11 DIAGNOSIS — N2581 Secondary hyperparathyroidism of renal origin: Secondary | ICD-10-CM | POA: Diagnosis not present

## 2016-02-11 DIAGNOSIS — N186 End stage renal disease: Secondary | ICD-10-CM | POA: Diagnosis not present

## 2016-02-11 DIAGNOSIS — D509 Iron deficiency anemia, unspecified: Secondary | ICD-10-CM | POA: Diagnosis not present

## 2016-02-14 DIAGNOSIS — N2581 Secondary hyperparathyroidism of renal origin: Secondary | ICD-10-CM | POA: Diagnosis not present

## 2016-02-14 DIAGNOSIS — Z992 Dependence on renal dialysis: Secondary | ICD-10-CM | POA: Diagnosis not present

## 2016-02-14 DIAGNOSIS — E785 Hyperlipidemia, unspecified: Secondary | ICD-10-CM | POA: Diagnosis not present

## 2016-02-14 DIAGNOSIS — N186 End stage renal disease: Secondary | ICD-10-CM | POA: Diagnosis not present

## 2016-02-14 DIAGNOSIS — D509 Iron deficiency anemia, unspecified: Secondary | ICD-10-CM | POA: Diagnosis not present

## 2016-02-14 DIAGNOSIS — D631 Anemia in chronic kidney disease: Secondary | ICD-10-CM | POA: Diagnosis not present

## 2016-02-15 DIAGNOSIS — H35033 Hypertensive retinopathy, bilateral: Secondary | ICD-10-CM | POA: Diagnosis not present

## 2016-02-16 DIAGNOSIS — N2581 Secondary hyperparathyroidism of renal origin: Secondary | ICD-10-CM | POA: Diagnosis not present

## 2016-02-16 DIAGNOSIS — Z992 Dependence on renal dialysis: Secondary | ICD-10-CM | POA: Diagnosis not present

## 2016-02-16 DIAGNOSIS — N186 End stage renal disease: Secondary | ICD-10-CM | POA: Diagnosis not present

## 2016-02-16 DIAGNOSIS — D509 Iron deficiency anemia, unspecified: Secondary | ICD-10-CM | POA: Diagnosis not present

## 2016-02-16 DIAGNOSIS — D631 Anemia in chronic kidney disease: Secondary | ICD-10-CM | POA: Diagnosis not present

## 2016-02-17 DIAGNOSIS — Z992 Dependence on renal dialysis: Secondary | ICD-10-CM | POA: Diagnosis not present

## 2016-02-17 DIAGNOSIS — N186 End stage renal disease: Secondary | ICD-10-CM | POA: Diagnosis not present

## 2016-02-18 DIAGNOSIS — N2581 Secondary hyperparathyroidism of renal origin: Secondary | ICD-10-CM | POA: Diagnosis not present

## 2016-02-18 DIAGNOSIS — D509 Iron deficiency anemia, unspecified: Secondary | ICD-10-CM | POA: Diagnosis not present

## 2016-02-18 DIAGNOSIS — D631 Anemia in chronic kidney disease: Secondary | ICD-10-CM | POA: Diagnosis not present

## 2016-02-18 DIAGNOSIS — N186 End stage renal disease: Secondary | ICD-10-CM | POA: Diagnosis not present

## 2016-02-18 DIAGNOSIS — Z992 Dependence on renal dialysis: Secondary | ICD-10-CM | POA: Diagnosis not present

## 2016-02-21 DIAGNOSIS — Z992 Dependence on renal dialysis: Secondary | ICD-10-CM | POA: Diagnosis not present

## 2016-02-21 DIAGNOSIS — N186 End stage renal disease: Secondary | ICD-10-CM | POA: Diagnosis not present

## 2016-02-21 DIAGNOSIS — D509 Iron deficiency anemia, unspecified: Secondary | ICD-10-CM | POA: Diagnosis not present

## 2016-02-21 DIAGNOSIS — D631 Anemia in chronic kidney disease: Secondary | ICD-10-CM | POA: Diagnosis not present

## 2016-02-21 DIAGNOSIS — N2581 Secondary hyperparathyroidism of renal origin: Secondary | ICD-10-CM | POA: Diagnosis not present

## 2016-02-23 DIAGNOSIS — N186 End stage renal disease: Secondary | ICD-10-CM | POA: Diagnosis not present

## 2016-02-23 DIAGNOSIS — Z992 Dependence on renal dialysis: Secondary | ICD-10-CM | POA: Diagnosis not present

## 2016-02-23 DIAGNOSIS — N2581 Secondary hyperparathyroidism of renal origin: Secondary | ICD-10-CM | POA: Diagnosis not present

## 2016-02-23 DIAGNOSIS — D631 Anemia in chronic kidney disease: Secondary | ICD-10-CM | POA: Diagnosis not present

## 2016-02-23 DIAGNOSIS — D509 Iron deficiency anemia, unspecified: Secondary | ICD-10-CM | POA: Diagnosis not present

## 2016-02-25 DIAGNOSIS — D631 Anemia in chronic kidney disease: Secondary | ICD-10-CM | POA: Diagnosis not present

## 2016-02-25 DIAGNOSIS — D509 Iron deficiency anemia, unspecified: Secondary | ICD-10-CM | POA: Diagnosis not present

## 2016-02-25 DIAGNOSIS — N2581 Secondary hyperparathyroidism of renal origin: Secondary | ICD-10-CM | POA: Diagnosis not present

## 2016-02-25 DIAGNOSIS — N186 End stage renal disease: Secondary | ICD-10-CM | POA: Diagnosis not present

## 2016-02-25 DIAGNOSIS — Z992 Dependence on renal dialysis: Secondary | ICD-10-CM | POA: Diagnosis not present

## 2016-02-28 DIAGNOSIS — N186 End stage renal disease: Secondary | ICD-10-CM | POA: Diagnosis not present

## 2016-02-28 DIAGNOSIS — D509 Iron deficiency anemia, unspecified: Secondary | ICD-10-CM | POA: Diagnosis not present

## 2016-02-28 DIAGNOSIS — Z992 Dependence on renal dialysis: Secondary | ICD-10-CM | POA: Diagnosis not present

## 2016-02-28 DIAGNOSIS — D631 Anemia in chronic kidney disease: Secondary | ICD-10-CM | POA: Diagnosis not present

## 2016-02-28 DIAGNOSIS — N2581 Secondary hyperparathyroidism of renal origin: Secondary | ICD-10-CM | POA: Diagnosis not present

## 2016-03-01 DIAGNOSIS — N186 End stage renal disease: Secondary | ICD-10-CM | POA: Diagnosis not present

## 2016-03-01 DIAGNOSIS — D509 Iron deficiency anemia, unspecified: Secondary | ICD-10-CM | POA: Diagnosis not present

## 2016-03-01 DIAGNOSIS — D631 Anemia in chronic kidney disease: Secondary | ICD-10-CM | POA: Diagnosis not present

## 2016-03-01 DIAGNOSIS — N2581 Secondary hyperparathyroidism of renal origin: Secondary | ICD-10-CM | POA: Diagnosis not present

## 2016-03-01 DIAGNOSIS — Z992 Dependence on renal dialysis: Secondary | ICD-10-CM | POA: Diagnosis not present

## 2016-03-03 DIAGNOSIS — N2581 Secondary hyperparathyroidism of renal origin: Secondary | ICD-10-CM | POA: Diagnosis not present

## 2016-03-03 DIAGNOSIS — D631 Anemia in chronic kidney disease: Secondary | ICD-10-CM | POA: Diagnosis not present

## 2016-03-03 DIAGNOSIS — N186 End stage renal disease: Secondary | ICD-10-CM | POA: Diagnosis not present

## 2016-03-03 DIAGNOSIS — Z992 Dependence on renal dialysis: Secondary | ICD-10-CM | POA: Diagnosis not present

## 2016-03-03 DIAGNOSIS — D509 Iron deficiency anemia, unspecified: Secondary | ICD-10-CM | POA: Diagnosis not present

## 2016-03-06 DIAGNOSIS — Z992 Dependence on renal dialysis: Secondary | ICD-10-CM | POA: Diagnosis not present

## 2016-03-06 DIAGNOSIS — N186 End stage renal disease: Secondary | ICD-10-CM | POA: Diagnosis not present

## 2016-03-06 DIAGNOSIS — N2581 Secondary hyperparathyroidism of renal origin: Secondary | ICD-10-CM | POA: Diagnosis not present

## 2016-03-06 DIAGNOSIS — D631 Anemia in chronic kidney disease: Secondary | ICD-10-CM | POA: Diagnosis not present

## 2016-03-06 DIAGNOSIS — D509 Iron deficiency anemia, unspecified: Secondary | ICD-10-CM | POA: Diagnosis not present

## 2016-03-08 DIAGNOSIS — D631 Anemia in chronic kidney disease: Secondary | ICD-10-CM | POA: Diagnosis not present

## 2016-03-08 DIAGNOSIS — Z992 Dependence on renal dialysis: Secondary | ICD-10-CM | POA: Diagnosis not present

## 2016-03-08 DIAGNOSIS — M79641 Pain in right hand: Secondary | ICD-10-CM | POA: Diagnosis not present

## 2016-03-08 DIAGNOSIS — E7800 Pure hypercholesterolemia, unspecified: Secondary | ICD-10-CM | POA: Diagnosis not present

## 2016-03-08 DIAGNOSIS — I120 Hypertensive chronic kidney disease with stage 5 chronic kidney disease or end stage renal disease: Secondary | ICD-10-CM | POA: Diagnosis not present

## 2016-03-08 DIAGNOSIS — M79642 Pain in left hand: Secondary | ICD-10-CM | POA: Diagnosis not present

## 2016-03-08 DIAGNOSIS — D509 Iron deficiency anemia, unspecified: Secondary | ICD-10-CM | POA: Diagnosis not present

## 2016-03-08 DIAGNOSIS — D649 Anemia, unspecified: Secondary | ICD-10-CM | POA: Diagnosis not present

## 2016-03-08 DIAGNOSIS — N186 End stage renal disease: Secondary | ICD-10-CM | POA: Diagnosis not present

## 2016-03-08 DIAGNOSIS — N2581 Secondary hyperparathyroidism of renal origin: Secondary | ICD-10-CM | POA: Diagnosis not present

## 2016-03-08 DIAGNOSIS — R531 Weakness: Secondary | ICD-10-CM | POA: Diagnosis not present

## 2016-03-10 DIAGNOSIS — D509 Iron deficiency anemia, unspecified: Secondary | ICD-10-CM | POA: Diagnosis not present

## 2016-03-10 DIAGNOSIS — D631 Anemia in chronic kidney disease: Secondary | ICD-10-CM | POA: Diagnosis not present

## 2016-03-10 DIAGNOSIS — N186 End stage renal disease: Secondary | ICD-10-CM | POA: Diagnosis not present

## 2016-03-10 DIAGNOSIS — Z992 Dependence on renal dialysis: Secondary | ICD-10-CM | POA: Diagnosis not present

## 2016-03-10 DIAGNOSIS — N2581 Secondary hyperparathyroidism of renal origin: Secondary | ICD-10-CM | POA: Diagnosis not present

## 2016-03-13 DIAGNOSIS — D631 Anemia in chronic kidney disease: Secondary | ICD-10-CM | POA: Diagnosis not present

## 2016-03-13 DIAGNOSIS — Z992 Dependence on renal dialysis: Secondary | ICD-10-CM | POA: Diagnosis not present

## 2016-03-13 DIAGNOSIS — D509 Iron deficiency anemia, unspecified: Secondary | ICD-10-CM | POA: Diagnosis not present

## 2016-03-13 DIAGNOSIS — N2581 Secondary hyperparathyroidism of renal origin: Secondary | ICD-10-CM | POA: Diagnosis not present

## 2016-03-13 DIAGNOSIS — N186 End stage renal disease: Secondary | ICD-10-CM | POA: Diagnosis not present

## 2016-03-15 DIAGNOSIS — Z992 Dependence on renal dialysis: Secondary | ICD-10-CM | POA: Diagnosis not present

## 2016-03-15 DIAGNOSIS — D509 Iron deficiency anemia, unspecified: Secondary | ICD-10-CM | POA: Diagnosis not present

## 2016-03-15 DIAGNOSIS — N2581 Secondary hyperparathyroidism of renal origin: Secondary | ICD-10-CM | POA: Diagnosis not present

## 2016-03-15 DIAGNOSIS — D631 Anemia in chronic kidney disease: Secondary | ICD-10-CM | POA: Diagnosis not present

## 2016-03-15 DIAGNOSIS — N186 End stage renal disease: Secondary | ICD-10-CM | POA: Diagnosis not present

## 2016-03-17 DIAGNOSIS — D509 Iron deficiency anemia, unspecified: Secondary | ICD-10-CM | POA: Diagnosis not present

## 2016-03-17 DIAGNOSIS — Z992 Dependence on renal dialysis: Secondary | ICD-10-CM | POA: Diagnosis not present

## 2016-03-17 DIAGNOSIS — N2581 Secondary hyperparathyroidism of renal origin: Secondary | ICD-10-CM | POA: Diagnosis not present

## 2016-03-17 DIAGNOSIS — N186 End stage renal disease: Secondary | ICD-10-CM | POA: Diagnosis not present

## 2016-03-17 DIAGNOSIS — D631 Anemia in chronic kidney disease: Secondary | ICD-10-CM | POA: Diagnosis not present

## 2016-03-18 DIAGNOSIS — N186 End stage renal disease: Secondary | ICD-10-CM | POA: Diagnosis not present

## 2016-03-18 DIAGNOSIS — Z992 Dependence on renal dialysis: Secondary | ICD-10-CM | POA: Diagnosis not present

## 2016-03-20 DIAGNOSIS — N2581 Secondary hyperparathyroidism of renal origin: Secondary | ICD-10-CM | POA: Diagnosis not present

## 2016-03-20 DIAGNOSIS — D631 Anemia in chronic kidney disease: Secondary | ICD-10-CM | POA: Diagnosis not present

## 2016-03-20 DIAGNOSIS — D509 Iron deficiency anemia, unspecified: Secondary | ICD-10-CM | POA: Diagnosis not present

## 2016-03-20 DIAGNOSIS — Z992 Dependence on renal dialysis: Secondary | ICD-10-CM | POA: Diagnosis not present

## 2016-03-20 DIAGNOSIS — N186 End stage renal disease: Secondary | ICD-10-CM | POA: Diagnosis not present

## 2016-03-21 DIAGNOSIS — D649 Anemia, unspecified: Secondary | ICD-10-CM | POA: Diagnosis not present

## 2016-03-21 DIAGNOSIS — I120 Hypertensive chronic kidney disease with stage 5 chronic kidney disease or end stage renal disease: Secondary | ICD-10-CM | POA: Diagnosis not present

## 2016-03-21 DIAGNOSIS — M79642 Pain in left hand: Secondary | ICD-10-CM | POA: Diagnosis not present

## 2016-03-21 DIAGNOSIS — R531 Weakness: Secondary | ICD-10-CM | POA: Diagnosis not present

## 2016-03-21 DIAGNOSIS — N186 End stage renal disease: Secondary | ICD-10-CM | POA: Diagnosis not present

## 2016-03-21 DIAGNOSIS — M79641 Pain in right hand: Secondary | ICD-10-CM | POA: Diagnosis not present

## 2016-03-22 DIAGNOSIS — D631 Anemia in chronic kidney disease: Secondary | ICD-10-CM | POA: Diagnosis not present

## 2016-03-22 DIAGNOSIS — Z992 Dependence on renal dialysis: Secondary | ICD-10-CM | POA: Diagnosis not present

## 2016-03-22 DIAGNOSIS — D509 Iron deficiency anemia, unspecified: Secondary | ICD-10-CM | POA: Diagnosis not present

## 2016-03-22 DIAGNOSIS — N2581 Secondary hyperparathyroidism of renal origin: Secondary | ICD-10-CM | POA: Diagnosis not present

## 2016-03-22 DIAGNOSIS — N186 End stage renal disease: Secondary | ICD-10-CM | POA: Diagnosis not present

## 2016-03-23 DIAGNOSIS — I120 Hypertensive chronic kidney disease with stage 5 chronic kidney disease or end stage renal disease: Secondary | ICD-10-CM | POA: Diagnosis not present

## 2016-03-23 DIAGNOSIS — D649 Anemia, unspecified: Secondary | ICD-10-CM | POA: Diagnosis not present

## 2016-03-23 DIAGNOSIS — I10 Essential (primary) hypertension: Secondary | ICD-10-CM | POA: Diagnosis not present

## 2016-03-23 DIAGNOSIS — E7800 Pure hypercholesterolemia, unspecified: Secondary | ICD-10-CM | POA: Diagnosis not present

## 2016-03-23 DIAGNOSIS — M79641 Pain in right hand: Secondary | ICD-10-CM | POA: Diagnosis not present

## 2016-03-23 DIAGNOSIS — N186 End stage renal disease: Secondary | ICD-10-CM | POA: Diagnosis not present

## 2016-03-23 DIAGNOSIS — R531 Weakness: Secondary | ICD-10-CM | POA: Diagnosis not present

## 2016-03-23 DIAGNOSIS — M79642 Pain in left hand: Secondary | ICD-10-CM | POA: Diagnosis not present

## 2016-03-24 DIAGNOSIS — N186 End stage renal disease: Secondary | ICD-10-CM | POA: Diagnosis not present

## 2016-03-24 DIAGNOSIS — D631 Anemia in chronic kidney disease: Secondary | ICD-10-CM | POA: Diagnosis not present

## 2016-03-24 DIAGNOSIS — D509 Iron deficiency anemia, unspecified: Secondary | ICD-10-CM | POA: Diagnosis not present

## 2016-03-24 DIAGNOSIS — Z992 Dependence on renal dialysis: Secondary | ICD-10-CM | POA: Diagnosis not present

## 2016-03-24 DIAGNOSIS — N2581 Secondary hyperparathyroidism of renal origin: Secondary | ICD-10-CM | POA: Diagnosis not present

## 2016-03-27 DIAGNOSIS — N186 End stage renal disease: Secondary | ICD-10-CM | POA: Diagnosis not present

## 2016-03-27 DIAGNOSIS — Z992 Dependence on renal dialysis: Secondary | ICD-10-CM | POA: Diagnosis not present

## 2016-03-27 DIAGNOSIS — D631 Anemia in chronic kidney disease: Secondary | ICD-10-CM | POA: Diagnosis not present

## 2016-03-27 DIAGNOSIS — N2581 Secondary hyperparathyroidism of renal origin: Secondary | ICD-10-CM | POA: Diagnosis not present

## 2016-03-27 DIAGNOSIS — D509 Iron deficiency anemia, unspecified: Secondary | ICD-10-CM | POA: Diagnosis not present

## 2016-03-29 DIAGNOSIS — Z992 Dependence on renal dialysis: Secondary | ICD-10-CM | POA: Diagnosis not present

## 2016-03-29 DIAGNOSIS — D509 Iron deficiency anemia, unspecified: Secondary | ICD-10-CM | POA: Diagnosis not present

## 2016-03-29 DIAGNOSIS — N2581 Secondary hyperparathyroidism of renal origin: Secondary | ICD-10-CM | POA: Diagnosis not present

## 2016-03-29 DIAGNOSIS — N186 End stage renal disease: Secondary | ICD-10-CM | POA: Diagnosis not present

## 2016-03-29 DIAGNOSIS — D631 Anemia in chronic kidney disease: Secondary | ICD-10-CM | POA: Diagnosis not present

## 2016-03-31 DIAGNOSIS — D509 Iron deficiency anemia, unspecified: Secondary | ICD-10-CM | POA: Diagnosis not present

## 2016-03-31 DIAGNOSIS — N186 End stage renal disease: Secondary | ICD-10-CM | POA: Diagnosis not present

## 2016-03-31 DIAGNOSIS — Z992 Dependence on renal dialysis: Secondary | ICD-10-CM | POA: Diagnosis not present

## 2016-03-31 DIAGNOSIS — D631 Anemia in chronic kidney disease: Secondary | ICD-10-CM | POA: Diagnosis not present

## 2016-03-31 DIAGNOSIS — N2581 Secondary hyperparathyroidism of renal origin: Secondary | ICD-10-CM | POA: Diagnosis not present

## 2016-04-02 DIAGNOSIS — R531 Weakness: Secondary | ICD-10-CM | POA: Diagnosis not present

## 2016-04-02 DIAGNOSIS — M79641 Pain in right hand: Secondary | ICD-10-CM | POA: Diagnosis not present

## 2016-04-02 DIAGNOSIS — D649 Anemia, unspecified: Secondary | ICD-10-CM | POA: Diagnosis not present

## 2016-04-02 DIAGNOSIS — N186 End stage renal disease: Secondary | ICD-10-CM | POA: Diagnosis not present

## 2016-04-02 DIAGNOSIS — I120 Hypertensive chronic kidney disease with stage 5 chronic kidney disease or end stage renal disease: Secondary | ICD-10-CM | POA: Diagnosis not present

## 2016-04-02 DIAGNOSIS — M79642 Pain in left hand: Secondary | ICD-10-CM | POA: Diagnosis not present

## 2016-04-03 DIAGNOSIS — N2581 Secondary hyperparathyroidism of renal origin: Secondary | ICD-10-CM | POA: Diagnosis not present

## 2016-04-03 DIAGNOSIS — D509 Iron deficiency anemia, unspecified: Secondary | ICD-10-CM | POA: Diagnosis not present

## 2016-04-03 DIAGNOSIS — Z992 Dependence on renal dialysis: Secondary | ICD-10-CM | POA: Diagnosis not present

## 2016-04-03 DIAGNOSIS — D631 Anemia in chronic kidney disease: Secondary | ICD-10-CM | POA: Diagnosis not present

## 2016-04-03 DIAGNOSIS — N186 End stage renal disease: Secondary | ICD-10-CM | POA: Diagnosis not present

## 2016-04-04 DIAGNOSIS — M79641 Pain in right hand: Secondary | ICD-10-CM | POA: Diagnosis not present

## 2016-04-04 DIAGNOSIS — I120 Hypertensive chronic kidney disease with stage 5 chronic kidney disease or end stage renal disease: Secondary | ICD-10-CM | POA: Diagnosis not present

## 2016-04-04 DIAGNOSIS — R531 Weakness: Secondary | ICD-10-CM | POA: Diagnosis not present

## 2016-04-04 DIAGNOSIS — M79642 Pain in left hand: Secondary | ICD-10-CM | POA: Diagnosis not present

## 2016-04-04 DIAGNOSIS — D649 Anemia, unspecified: Secondary | ICD-10-CM | POA: Diagnosis not present

## 2016-04-04 DIAGNOSIS — N186 End stage renal disease: Secondary | ICD-10-CM | POA: Diagnosis not present

## 2016-04-05 DIAGNOSIS — Z992 Dependence on renal dialysis: Secondary | ICD-10-CM | POA: Diagnosis not present

## 2016-04-05 DIAGNOSIS — D631 Anemia in chronic kidney disease: Secondary | ICD-10-CM | POA: Diagnosis not present

## 2016-04-05 DIAGNOSIS — N2581 Secondary hyperparathyroidism of renal origin: Secondary | ICD-10-CM | POA: Diagnosis not present

## 2016-04-05 DIAGNOSIS — N186 End stage renal disease: Secondary | ICD-10-CM | POA: Diagnosis not present

## 2016-04-05 DIAGNOSIS — D509 Iron deficiency anemia, unspecified: Secondary | ICD-10-CM | POA: Diagnosis not present

## 2016-04-07 DIAGNOSIS — N2581 Secondary hyperparathyroidism of renal origin: Secondary | ICD-10-CM | POA: Diagnosis not present

## 2016-04-07 DIAGNOSIS — D631 Anemia in chronic kidney disease: Secondary | ICD-10-CM | POA: Diagnosis not present

## 2016-04-07 DIAGNOSIS — D509 Iron deficiency anemia, unspecified: Secondary | ICD-10-CM | POA: Diagnosis not present

## 2016-04-07 DIAGNOSIS — N186 End stage renal disease: Secondary | ICD-10-CM | POA: Diagnosis not present

## 2016-04-07 DIAGNOSIS — Z992 Dependence on renal dialysis: Secondary | ICD-10-CM | POA: Diagnosis not present

## 2016-04-10 DIAGNOSIS — D509 Iron deficiency anemia, unspecified: Secondary | ICD-10-CM | POA: Diagnosis not present

## 2016-04-10 DIAGNOSIS — N186 End stage renal disease: Secondary | ICD-10-CM | POA: Diagnosis not present

## 2016-04-10 DIAGNOSIS — D631 Anemia in chronic kidney disease: Secondary | ICD-10-CM | POA: Diagnosis not present

## 2016-04-10 DIAGNOSIS — Z992 Dependence on renal dialysis: Secondary | ICD-10-CM | POA: Diagnosis not present

## 2016-04-10 DIAGNOSIS — N2581 Secondary hyperparathyroidism of renal origin: Secondary | ICD-10-CM | POA: Diagnosis not present

## 2016-04-12 DIAGNOSIS — D631 Anemia in chronic kidney disease: Secondary | ICD-10-CM | POA: Diagnosis not present

## 2016-04-12 DIAGNOSIS — N2581 Secondary hyperparathyroidism of renal origin: Secondary | ICD-10-CM | POA: Diagnosis not present

## 2016-04-12 DIAGNOSIS — Z992 Dependence on renal dialysis: Secondary | ICD-10-CM | POA: Diagnosis not present

## 2016-04-12 DIAGNOSIS — D509 Iron deficiency anemia, unspecified: Secondary | ICD-10-CM | POA: Diagnosis not present

## 2016-04-12 DIAGNOSIS — N186 End stage renal disease: Secondary | ICD-10-CM | POA: Diagnosis not present

## 2016-04-14 DIAGNOSIS — Z992 Dependence on renal dialysis: Secondary | ICD-10-CM | POA: Diagnosis not present

## 2016-04-14 DIAGNOSIS — D509 Iron deficiency anemia, unspecified: Secondary | ICD-10-CM | POA: Diagnosis not present

## 2016-04-14 DIAGNOSIS — N2581 Secondary hyperparathyroidism of renal origin: Secondary | ICD-10-CM | POA: Diagnosis not present

## 2016-04-14 DIAGNOSIS — N186 End stage renal disease: Secondary | ICD-10-CM | POA: Diagnosis not present

## 2016-04-14 DIAGNOSIS — D631 Anemia in chronic kidney disease: Secondary | ICD-10-CM | POA: Diagnosis not present

## 2016-04-17 DIAGNOSIS — D509 Iron deficiency anemia, unspecified: Secondary | ICD-10-CM | POA: Diagnosis not present

## 2016-04-17 DIAGNOSIS — N186 End stage renal disease: Secondary | ICD-10-CM | POA: Diagnosis not present

## 2016-04-17 DIAGNOSIS — N2581 Secondary hyperparathyroidism of renal origin: Secondary | ICD-10-CM | POA: Diagnosis not present

## 2016-04-17 DIAGNOSIS — D631 Anemia in chronic kidney disease: Secondary | ICD-10-CM | POA: Diagnosis not present

## 2016-04-17 DIAGNOSIS — Z992 Dependence on renal dialysis: Secondary | ICD-10-CM | POA: Diagnosis not present

## 2016-04-18 DIAGNOSIS — Z992 Dependence on renal dialysis: Secondary | ICD-10-CM | POA: Diagnosis not present

## 2016-04-18 DIAGNOSIS — N186 End stage renal disease: Secondary | ICD-10-CM | POA: Diagnosis not present

## 2016-04-19 DIAGNOSIS — D631 Anemia in chronic kidney disease: Secondary | ICD-10-CM | POA: Diagnosis not present

## 2016-04-19 DIAGNOSIS — N186 End stage renal disease: Secondary | ICD-10-CM | POA: Diagnosis not present

## 2016-04-19 DIAGNOSIS — N2581 Secondary hyperparathyroidism of renal origin: Secondary | ICD-10-CM | POA: Diagnosis not present

## 2016-04-19 DIAGNOSIS — D509 Iron deficiency anemia, unspecified: Secondary | ICD-10-CM | POA: Diagnosis not present

## 2016-04-19 DIAGNOSIS — Z992 Dependence on renal dialysis: Secondary | ICD-10-CM | POA: Diagnosis not present

## 2016-04-21 DIAGNOSIS — D631 Anemia in chronic kidney disease: Secondary | ICD-10-CM | POA: Diagnosis not present

## 2016-04-21 DIAGNOSIS — N2581 Secondary hyperparathyroidism of renal origin: Secondary | ICD-10-CM | POA: Diagnosis not present

## 2016-04-21 DIAGNOSIS — Z992 Dependence on renal dialysis: Secondary | ICD-10-CM | POA: Diagnosis not present

## 2016-04-21 DIAGNOSIS — D509 Iron deficiency anemia, unspecified: Secondary | ICD-10-CM | POA: Diagnosis not present

## 2016-04-21 DIAGNOSIS — N186 End stage renal disease: Secondary | ICD-10-CM | POA: Diagnosis not present

## 2016-04-24 DIAGNOSIS — D509 Iron deficiency anemia, unspecified: Secondary | ICD-10-CM | POA: Diagnosis not present

## 2016-04-24 DIAGNOSIS — N186 End stage renal disease: Secondary | ICD-10-CM | POA: Diagnosis not present

## 2016-04-24 DIAGNOSIS — D631 Anemia in chronic kidney disease: Secondary | ICD-10-CM | POA: Diagnosis not present

## 2016-04-24 DIAGNOSIS — N2581 Secondary hyperparathyroidism of renal origin: Secondary | ICD-10-CM | POA: Diagnosis not present

## 2016-04-24 DIAGNOSIS — Z992 Dependence on renal dialysis: Secondary | ICD-10-CM | POA: Diagnosis not present

## 2016-04-26 DIAGNOSIS — N186 End stage renal disease: Secondary | ICD-10-CM | POA: Diagnosis not present

## 2016-04-26 DIAGNOSIS — Z992 Dependence on renal dialysis: Secondary | ICD-10-CM | POA: Diagnosis not present

## 2016-04-26 DIAGNOSIS — D631 Anemia in chronic kidney disease: Secondary | ICD-10-CM | POA: Diagnosis not present

## 2016-04-26 DIAGNOSIS — D509 Iron deficiency anemia, unspecified: Secondary | ICD-10-CM | POA: Diagnosis not present

## 2016-04-26 DIAGNOSIS — N2581 Secondary hyperparathyroidism of renal origin: Secondary | ICD-10-CM | POA: Diagnosis not present

## 2016-04-28 DIAGNOSIS — Z992 Dependence on renal dialysis: Secondary | ICD-10-CM | POA: Diagnosis not present

## 2016-04-28 DIAGNOSIS — D509 Iron deficiency anemia, unspecified: Secondary | ICD-10-CM | POA: Diagnosis not present

## 2016-04-28 DIAGNOSIS — D631 Anemia in chronic kidney disease: Secondary | ICD-10-CM | POA: Diagnosis not present

## 2016-04-28 DIAGNOSIS — N186 End stage renal disease: Secondary | ICD-10-CM | POA: Diagnosis not present

## 2016-04-28 DIAGNOSIS — N2581 Secondary hyperparathyroidism of renal origin: Secondary | ICD-10-CM | POA: Diagnosis not present

## 2016-04-30 DIAGNOSIS — S8011XA Contusion of right lower leg, initial encounter: Secondary | ICD-10-CM | POA: Diagnosis not present

## 2016-04-30 DIAGNOSIS — D649 Anemia, unspecified: Secondary | ICD-10-CM | POA: Diagnosis not present

## 2016-04-30 DIAGNOSIS — Z789 Other specified health status: Secondary | ICD-10-CM | POA: Diagnosis not present

## 2016-04-30 DIAGNOSIS — I270 Primary pulmonary hypertension: Secondary | ICD-10-CM | POA: Diagnosis not present

## 2016-05-01 DIAGNOSIS — N2581 Secondary hyperparathyroidism of renal origin: Secondary | ICD-10-CM | POA: Diagnosis not present

## 2016-05-01 DIAGNOSIS — D631 Anemia in chronic kidney disease: Secondary | ICD-10-CM | POA: Diagnosis not present

## 2016-05-01 DIAGNOSIS — D509 Iron deficiency anemia, unspecified: Secondary | ICD-10-CM | POA: Diagnosis not present

## 2016-05-01 DIAGNOSIS — Z992 Dependence on renal dialysis: Secondary | ICD-10-CM | POA: Diagnosis not present

## 2016-05-01 DIAGNOSIS — N186 End stage renal disease: Secondary | ICD-10-CM | POA: Diagnosis not present

## 2016-05-03 DIAGNOSIS — N2581 Secondary hyperparathyroidism of renal origin: Secondary | ICD-10-CM | POA: Diagnosis not present

## 2016-05-03 DIAGNOSIS — N186 End stage renal disease: Secondary | ICD-10-CM | POA: Diagnosis not present

## 2016-05-03 DIAGNOSIS — Z992 Dependence on renal dialysis: Secondary | ICD-10-CM | POA: Diagnosis not present

## 2016-05-03 DIAGNOSIS — D631 Anemia in chronic kidney disease: Secondary | ICD-10-CM | POA: Diagnosis not present

## 2016-05-03 DIAGNOSIS — D509 Iron deficiency anemia, unspecified: Secondary | ICD-10-CM | POA: Diagnosis not present

## 2016-05-05 DIAGNOSIS — N186 End stage renal disease: Secondary | ICD-10-CM | POA: Diagnosis not present

## 2016-05-05 DIAGNOSIS — Z992 Dependence on renal dialysis: Secondary | ICD-10-CM | POA: Diagnosis not present

## 2016-05-05 DIAGNOSIS — D509 Iron deficiency anemia, unspecified: Secondary | ICD-10-CM | POA: Diagnosis not present

## 2016-05-05 DIAGNOSIS — N2581 Secondary hyperparathyroidism of renal origin: Secondary | ICD-10-CM | POA: Diagnosis not present

## 2016-05-05 DIAGNOSIS — D631 Anemia in chronic kidney disease: Secondary | ICD-10-CM | POA: Diagnosis not present

## 2016-05-08 DIAGNOSIS — Z992 Dependence on renal dialysis: Secondary | ICD-10-CM | POA: Diagnosis not present

## 2016-05-08 DIAGNOSIS — D509 Iron deficiency anemia, unspecified: Secondary | ICD-10-CM | POA: Diagnosis not present

## 2016-05-08 DIAGNOSIS — N186 End stage renal disease: Secondary | ICD-10-CM | POA: Diagnosis not present

## 2016-05-08 DIAGNOSIS — N2581 Secondary hyperparathyroidism of renal origin: Secondary | ICD-10-CM | POA: Diagnosis not present

## 2016-05-08 DIAGNOSIS — D631 Anemia in chronic kidney disease: Secondary | ICD-10-CM | POA: Diagnosis not present

## 2016-05-10 DIAGNOSIS — D631 Anemia in chronic kidney disease: Secondary | ICD-10-CM | POA: Diagnosis not present

## 2016-05-10 DIAGNOSIS — Z992 Dependence on renal dialysis: Secondary | ICD-10-CM | POA: Diagnosis not present

## 2016-05-10 DIAGNOSIS — D509 Iron deficiency anemia, unspecified: Secondary | ICD-10-CM | POA: Diagnosis not present

## 2016-05-10 DIAGNOSIS — N2581 Secondary hyperparathyroidism of renal origin: Secondary | ICD-10-CM | POA: Diagnosis not present

## 2016-05-10 DIAGNOSIS — N186 End stage renal disease: Secondary | ICD-10-CM | POA: Diagnosis not present

## 2016-05-12 DIAGNOSIS — N2581 Secondary hyperparathyroidism of renal origin: Secondary | ICD-10-CM | POA: Diagnosis not present

## 2016-05-12 DIAGNOSIS — Z992 Dependence on renal dialysis: Secondary | ICD-10-CM | POA: Diagnosis not present

## 2016-05-12 DIAGNOSIS — D509 Iron deficiency anemia, unspecified: Secondary | ICD-10-CM | POA: Diagnosis not present

## 2016-05-12 DIAGNOSIS — N186 End stage renal disease: Secondary | ICD-10-CM | POA: Diagnosis not present

## 2016-05-12 DIAGNOSIS — D631 Anemia in chronic kidney disease: Secondary | ICD-10-CM | POA: Diagnosis not present

## 2016-05-15 DIAGNOSIS — N2581 Secondary hyperparathyroidism of renal origin: Secondary | ICD-10-CM | POA: Diagnosis not present

## 2016-05-15 DIAGNOSIS — D631 Anemia in chronic kidney disease: Secondary | ICD-10-CM | POA: Diagnosis not present

## 2016-05-15 DIAGNOSIS — N186 End stage renal disease: Secondary | ICD-10-CM | POA: Diagnosis not present

## 2016-05-15 DIAGNOSIS — D509 Iron deficiency anemia, unspecified: Secondary | ICD-10-CM | POA: Diagnosis not present

## 2016-05-15 DIAGNOSIS — Z992 Dependence on renal dialysis: Secondary | ICD-10-CM | POA: Diagnosis not present

## 2016-05-17 DIAGNOSIS — D509 Iron deficiency anemia, unspecified: Secondary | ICD-10-CM | POA: Diagnosis not present

## 2016-05-17 DIAGNOSIS — N2581 Secondary hyperparathyroidism of renal origin: Secondary | ICD-10-CM | POA: Diagnosis not present

## 2016-05-17 DIAGNOSIS — Z992 Dependence on renal dialysis: Secondary | ICD-10-CM | POA: Diagnosis not present

## 2016-05-17 DIAGNOSIS — D631 Anemia in chronic kidney disease: Secondary | ICD-10-CM | POA: Diagnosis not present

## 2016-05-17 DIAGNOSIS — N186 End stage renal disease: Secondary | ICD-10-CM | POA: Diagnosis not present

## 2016-05-18 DIAGNOSIS — Z992 Dependence on renal dialysis: Secondary | ICD-10-CM | POA: Diagnosis not present

## 2016-05-18 DIAGNOSIS — N186 End stage renal disease: Secondary | ICD-10-CM | POA: Diagnosis not present

## 2016-05-19 DIAGNOSIS — N186 End stage renal disease: Secondary | ICD-10-CM | POA: Diagnosis not present

## 2016-05-19 DIAGNOSIS — D631 Anemia in chronic kidney disease: Secondary | ICD-10-CM | POA: Diagnosis not present

## 2016-05-19 DIAGNOSIS — Z992 Dependence on renal dialysis: Secondary | ICD-10-CM | POA: Diagnosis not present

## 2016-05-19 DIAGNOSIS — N2581 Secondary hyperparathyroidism of renal origin: Secondary | ICD-10-CM | POA: Diagnosis not present

## 2016-05-19 DIAGNOSIS — D509 Iron deficiency anemia, unspecified: Secondary | ICD-10-CM | POA: Diagnosis not present

## 2016-05-22 DIAGNOSIS — D631 Anemia in chronic kidney disease: Secondary | ICD-10-CM | POA: Diagnosis not present

## 2016-05-22 DIAGNOSIS — D509 Iron deficiency anemia, unspecified: Secondary | ICD-10-CM | POA: Diagnosis not present

## 2016-05-22 DIAGNOSIS — N2581 Secondary hyperparathyroidism of renal origin: Secondary | ICD-10-CM | POA: Diagnosis not present

## 2016-05-22 DIAGNOSIS — Z992 Dependence on renal dialysis: Secondary | ICD-10-CM | POA: Diagnosis not present

## 2016-05-22 DIAGNOSIS — N186 End stage renal disease: Secondary | ICD-10-CM | POA: Diagnosis not present

## 2016-05-24 DIAGNOSIS — Z992 Dependence on renal dialysis: Secondary | ICD-10-CM | POA: Diagnosis not present

## 2016-05-24 DIAGNOSIS — D509 Iron deficiency anemia, unspecified: Secondary | ICD-10-CM | POA: Diagnosis not present

## 2016-05-24 DIAGNOSIS — N2581 Secondary hyperparathyroidism of renal origin: Secondary | ICD-10-CM | POA: Diagnosis not present

## 2016-05-24 DIAGNOSIS — D631 Anemia in chronic kidney disease: Secondary | ICD-10-CM | POA: Diagnosis not present

## 2016-05-24 DIAGNOSIS — N186 End stage renal disease: Secondary | ICD-10-CM | POA: Diagnosis not present

## 2016-05-26 DIAGNOSIS — D631 Anemia in chronic kidney disease: Secondary | ICD-10-CM | POA: Diagnosis not present

## 2016-05-26 DIAGNOSIS — N186 End stage renal disease: Secondary | ICD-10-CM | POA: Diagnosis not present

## 2016-05-26 DIAGNOSIS — N2581 Secondary hyperparathyroidism of renal origin: Secondary | ICD-10-CM | POA: Diagnosis not present

## 2016-05-26 DIAGNOSIS — D509 Iron deficiency anemia, unspecified: Secondary | ICD-10-CM | POA: Diagnosis not present

## 2016-05-26 DIAGNOSIS — Z992 Dependence on renal dialysis: Secondary | ICD-10-CM | POA: Diagnosis not present

## 2016-05-29 DIAGNOSIS — N186 End stage renal disease: Secondary | ICD-10-CM | POA: Diagnosis not present

## 2016-05-29 DIAGNOSIS — Z992 Dependence on renal dialysis: Secondary | ICD-10-CM | POA: Diagnosis not present

## 2016-05-29 DIAGNOSIS — N2581 Secondary hyperparathyroidism of renal origin: Secondary | ICD-10-CM | POA: Diagnosis not present

## 2016-05-29 DIAGNOSIS — D631 Anemia in chronic kidney disease: Secondary | ICD-10-CM | POA: Diagnosis not present

## 2016-05-29 DIAGNOSIS — D509 Iron deficiency anemia, unspecified: Secondary | ICD-10-CM | POA: Diagnosis not present

## 2016-05-31 DIAGNOSIS — N2581 Secondary hyperparathyroidism of renal origin: Secondary | ICD-10-CM | POA: Diagnosis not present

## 2016-05-31 DIAGNOSIS — D509 Iron deficiency anemia, unspecified: Secondary | ICD-10-CM | POA: Diagnosis not present

## 2016-05-31 DIAGNOSIS — N186 End stage renal disease: Secondary | ICD-10-CM | POA: Diagnosis not present

## 2016-05-31 DIAGNOSIS — Z992 Dependence on renal dialysis: Secondary | ICD-10-CM | POA: Diagnosis not present

## 2016-05-31 DIAGNOSIS — D631 Anemia in chronic kidney disease: Secondary | ICD-10-CM | POA: Diagnosis not present

## 2016-06-02 DIAGNOSIS — D631 Anemia in chronic kidney disease: Secondary | ICD-10-CM | POA: Diagnosis not present

## 2016-06-02 DIAGNOSIS — N186 End stage renal disease: Secondary | ICD-10-CM | POA: Diagnosis not present

## 2016-06-02 DIAGNOSIS — Z992 Dependence on renal dialysis: Secondary | ICD-10-CM | POA: Diagnosis not present

## 2016-06-02 DIAGNOSIS — D509 Iron deficiency anemia, unspecified: Secondary | ICD-10-CM | POA: Diagnosis not present

## 2016-06-02 DIAGNOSIS — N2581 Secondary hyperparathyroidism of renal origin: Secondary | ICD-10-CM | POA: Diagnosis not present

## 2016-06-05 DIAGNOSIS — N186 End stage renal disease: Secondary | ICD-10-CM | POA: Diagnosis not present

## 2016-06-05 DIAGNOSIS — D631 Anemia in chronic kidney disease: Secondary | ICD-10-CM | POA: Diagnosis not present

## 2016-06-05 DIAGNOSIS — N2581 Secondary hyperparathyroidism of renal origin: Secondary | ICD-10-CM | POA: Diagnosis not present

## 2016-06-05 DIAGNOSIS — D509 Iron deficiency anemia, unspecified: Secondary | ICD-10-CM | POA: Diagnosis not present

## 2016-06-05 DIAGNOSIS — Z992 Dependence on renal dialysis: Secondary | ICD-10-CM | POA: Diagnosis not present

## 2016-06-07 DIAGNOSIS — D509 Iron deficiency anemia, unspecified: Secondary | ICD-10-CM | POA: Diagnosis not present

## 2016-06-07 DIAGNOSIS — N186 End stage renal disease: Secondary | ICD-10-CM | POA: Diagnosis not present

## 2016-06-07 DIAGNOSIS — D631 Anemia in chronic kidney disease: Secondary | ICD-10-CM | POA: Diagnosis not present

## 2016-06-07 DIAGNOSIS — Z992 Dependence on renal dialysis: Secondary | ICD-10-CM | POA: Diagnosis not present

## 2016-06-07 DIAGNOSIS — N2581 Secondary hyperparathyroidism of renal origin: Secondary | ICD-10-CM | POA: Diagnosis not present

## 2016-06-09 DIAGNOSIS — N2581 Secondary hyperparathyroidism of renal origin: Secondary | ICD-10-CM | POA: Diagnosis not present

## 2016-06-09 DIAGNOSIS — Z992 Dependence on renal dialysis: Secondary | ICD-10-CM | POA: Diagnosis not present

## 2016-06-09 DIAGNOSIS — D631 Anemia in chronic kidney disease: Secondary | ICD-10-CM | POA: Diagnosis not present

## 2016-06-09 DIAGNOSIS — N186 End stage renal disease: Secondary | ICD-10-CM | POA: Diagnosis not present

## 2016-06-09 DIAGNOSIS — D509 Iron deficiency anemia, unspecified: Secondary | ICD-10-CM | POA: Diagnosis not present

## 2016-06-12 DIAGNOSIS — Z992 Dependence on renal dialysis: Secondary | ICD-10-CM | POA: Diagnosis not present

## 2016-06-12 DIAGNOSIS — N2581 Secondary hyperparathyroidism of renal origin: Secondary | ICD-10-CM | POA: Diagnosis not present

## 2016-06-12 DIAGNOSIS — D631 Anemia in chronic kidney disease: Secondary | ICD-10-CM | POA: Diagnosis not present

## 2016-06-12 DIAGNOSIS — D509 Iron deficiency anemia, unspecified: Secondary | ICD-10-CM | POA: Diagnosis not present

## 2016-06-12 DIAGNOSIS — N186 End stage renal disease: Secondary | ICD-10-CM | POA: Diagnosis not present

## 2016-06-14 DIAGNOSIS — D631 Anemia in chronic kidney disease: Secondary | ICD-10-CM | POA: Diagnosis not present

## 2016-06-14 DIAGNOSIS — Z992 Dependence on renal dialysis: Secondary | ICD-10-CM | POA: Diagnosis not present

## 2016-06-14 DIAGNOSIS — D509 Iron deficiency anemia, unspecified: Secondary | ICD-10-CM | POA: Diagnosis not present

## 2016-06-14 DIAGNOSIS — N186 End stage renal disease: Secondary | ICD-10-CM | POA: Diagnosis not present

## 2016-06-14 DIAGNOSIS — N2581 Secondary hyperparathyroidism of renal origin: Secondary | ICD-10-CM | POA: Diagnosis not present

## 2016-06-16 DIAGNOSIS — N186 End stage renal disease: Secondary | ICD-10-CM | POA: Diagnosis not present

## 2016-06-16 DIAGNOSIS — D631 Anemia in chronic kidney disease: Secondary | ICD-10-CM | POA: Diagnosis not present

## 2016-06-16 DIAGNOSIS — D509 Iron deficiency anemia, unspecified: Secondary | ICD-10-CM | POA: Diagnosis not present

## 2016-06-16 DIAGNOSIS — Z992 Dependence on renal dialysis: Secondary | ICD-10-CM | POA: Diagnosis not present

## 2016-06-16 DIAGNOSIS — N2581 Secondary hyperparathyroidism of renal origin: Secondary | ICD-10-CM | POA: Diagnosis not present

## 2016-06-18 DIAGNOSIS — N186 End stage renal disease: Secondary | ICD-10-CM | POA: Diagnosis not present

## 2016-06-18 DIAGNOSIS — Z992 Dependence on renal dialysis: Secondary | ICD-10-CM | POA: Diagnosis not present

## 2016-06-19 DIAGNOSIS — Z992 Dependence on renal dialysis: Secondary | ICD-10-CM | POA: Diagnosis not present

## 2016-06-19 DIAGNOSIS — N186 End stage renal disease: Secondary | ICD-10-CM | POA: Diagnosis not present

## 2016-06-19 DIAGNOSIS — D509 Iron deficiency anemia, unspecified: Secondary | ICD-10-CM | POA: Diagnosis not present

## 2016-06-19 DIAGNOSIS — D631 Anemia in chronic kidney disease: Secondary | ICD-10-CM | POA: Diagnosis not present

## 2016-06-19 DIAGNOSIS — N2581 Secondary hyperparathyroidism of renal origin: Secondary | ICD-10-CM | POA: Diagnosis not present

## 2016-06-21 DIAGNOSIS — N186 End stage renal disease: Secondary | ICD-10-CM | POA: Diagnosis not present

## 2016-06-21 DIAGNOSIS — N2581 Secondary hyperparathyroidism of renal origin: Secondary | ICD-10-CM | POA: Diagnosis not present

## 2016-06-21 DIAGNOSIS — Z992 Dependence on renal dialysis: Secondary | ICD-10-CM | POA: Diagnosis not present

## 2016-06-21 DIAGNOSIS — D509 Iron deficiency anemia, unspecified: Secondary | ICD-10-CM | POA: Diagnosis not present

## 2016-06-21 DIAGNOSIS — D631 Anemia in chronic kidney disease: Secondary | ICD-10-CM | POA: Diagnosis not present

## 2016-06-23 DIAGNOSIS — N2581 Secondary hyperparathyroidism of renal origin: Secondary | ICD-10-CM | POA: Diagnosis not present

## 2016-06-23 DIAGNOSIS — Z992 Dependence on renal dialysis: Secondary | ICD-10-CM | POA: Diagnosis not present

## 2016-06-23 DIAGNOSIS — D631 Anemia in chronic kidney disease: Secondary | ICD-10-CM | POA: Diagnosis not present

## 2016-06-23 DIAGNOSIS — N186 End stage renal disease: Secondary | ICD-10-CM | POA: Diagnosis not present

## 2016-06-23 DIAGNOSIS — D509 Iron deficiency anemia, unspecified: Secondary | ICD-10-CM | POA: Diagnosis not present

## 2016-06-26 DIAGNOSIS — D509 Iron deficiency anemia, unspecified: Secondary | ICD-10-CM | POA: Diagnosis not present

## 2016-06-26 DIAGNOSIS — D631 Anemia in chronic kidney disease: Secondary | ICD-10-CM | POA: Diagnosis not present

## 2016-06-26 DIAGNOSIS — Z992 Dependence on renal dialysis: Secondary | ICD-10-CM | POA: Diagnosis not present

## 2016-06-26 DIAGNOSIS — N186 End stage renal disease: Secondary | ICD-10-CM | POA: Diagnosis not present

## 2016-06-26 DIAGNOSIS — N2581 Secondary hyperparathyroidism of renal origin: Secondary | ICD-10-CM | POA: Diagnosis not present

## 2016-06-28 DIAGNOSIS — Z992 Dependence on renal dialysis: Secondary | ICD-10-CM | POA: Diagnosis not present

## 2016-06-28 DIAGNOSIS — N186 End stage renal disease: Secondary | ICD-10-CM | POA: Diagnosis not present

## 2016-06-28 DIAGNOSIS — D631 Anemia in chronic kidney disease: Secondary | ICD-10-CM | POA: Diagnosis not present

## 2016-06-28 DIAGNOSIS — N2581 Secondary hyperparathyroidism of renal origin: Secondary | ICD-10-CM | POA: Diagnosis not present

## 2016-06-28 DIAGNOSIS — D509 Iron deficiency anemia, unspecified: Secondary | ICD-10-CM | POA: Diagnosis not present

## 2016-06-30 DIAGNOSIS — N186 End stage renal disease: Secondary | ICD-10-CM | POA: Diagnosis not present

## 2016-06-30 DIAGNOSIS — N2581 Secondary hyperparathyroidism of renal origin: Secondary | ICD-10-CM | POA: Diagnosis not present

## 2016-06-30 DIAGNOSIS — D509 Iron deficiency anemia, unspecified: Secondary | ICD-10-CM | POA: Diagnosis not present

## 2016-06-30 DIAGNOSIS — Z992 Dependence on renal dialysis: Secondary | ICD-10-CM | POA: Diagnosis not present

## 2016-06-30 DIAGNOSIS — D631 Anemia in chronic kidney disease: Secondary | ICD-10-CM | POA: Diagnosis not present

## 2016-07-03 DIAGNOSIS — N186 End stage renal disease: Secondary | ICD-10-CM | POA: Diagnosis not present

## 2016-07-03 DIAGNOSIS — D631 Anemia in chronic kidney disease: Secondary | ICD-10-CM | POA: Diagnosis not present

## 2016-07-03 DIAGNOSIS — Z992 Dependence on renal dialysis: Secondary | ICD-10-CM | POA: Diagnosis not present

## 2016-07-03 DIAGNOSIS — N2581 Secondary hyperparathyroidism of renal origin: Secondary | ICD-10-CM | POA: Diagnosis not present

## 2016-07-03 DIAGNOSIS — D509 Iron deficiency anemia, unspecified: Secondary | ICD-10-CM | POA: Diagnosis not present

## 2016-07-05 DIAGNOSIS — Z992 Dependence on renal dialysis: Secondary | ICD-10-CM | POA: Diagnosis not present

## 2016-07-05 DIAGNOSIS — N2581 Secondary hyperparathyroidism of renal origin: Secondary | ICD-10-CM | POA: Diagnosis not present

## 2016-07-05 DIAGNOSIS — N186 End stage renal disease: Secondary | ICD-10-CM | POA: Diagnosis not present

## 2016-07-05 DIAGNOSIS — D509 Iron deficiency anemia, unspecified: Secondary | ICD-10-CM | POA: Diagnosis not present

## 2016-07-05 DIAGNOSIS — D631 Anemia in chronic kidney disease: Secondary | ICD-10-CM | POA: Diagnosis not present

## 2016-07-08 DIAGNOSIS — N186 End stage renal disease: Secondary | ICD-10-CM | POA: Diagnosis not present

## 2016-07-08 DIAGNOSIS — Z992 Dependence on renal dialysis: Secondary | ICD-10-CM | POA: Diagnosis not present

## 2016-07-08 DIAGNOSIS — D631 Anemia in chronic kidney disease: Secondary | ICD-10-CM | POA: Diagnosis not present

## 2016-07-08 DIAGNOSIS — N2581 Secondary hyperparathyroidism of renal origin: Secondary | ICD-10-CM | POA: Diagnosis not present

## 2016-07-08 DIAGNOSIS — D509 Iron deficiency anemia, unspecified: Secondary | ICD-10-CM | POA: Diagnosis not present

## 2016-07-10 DIAGNOSIS — Z992 Dependence on renal dialysis: Secondary | ICD-10-CM | POA: Diagnosis not present

## 2016-07-10 DIAGNOSIS — D631 Anemia in chronic kidney disease: Secondary | ICD-10-CM | POA: Diagnosis not present

## 2016-07-10 DIAGNOSIS — N2581 Secondary hyperparathyroidism of renal origin: Secondary | ICD-10-CM | POA: Diagnosis not present

## 2016-07-10 DIAGNOSIS — D509 Iron deficiency anemia, unspecified: Secondary | ICD-10-CM | POA: Diagnosis not present

## 2016-07-10 DIAGNOSIS — N186 End stage renal disease: Secondary | ICD-10-CM | POA: Diagnosis not present

## 2016-07-12 DIAGNOSIS — Z992 Dependence on renal dialysis: Secondary | ICD-10-CM | POA: Diagnosis not present

## 2016-07-12 DIAGNOSIS — D509 Iron deficiency anemia, unspecified: Secondary | ICD-10-CM | POA: Diagnosis not present

## 2016-07-12 DIAGNOSIS — N2581 Secondary hyperparathyroidism of renal origin: Secondary | ICD-10-CM | POA: Diagnosis not present

## 2016-07-12 DIAGNOSIS — D631 Anemia in chronic kidney disease: Secondary | ICD-10-CM | POA: Diagnosis not present

## 2016-07-12 DIAGNOSIS — N186 End stage renal disease: Secondary | ICD-10-CM | POA: Diagnosis not present

## 2016-07-14 DIAGNOSIS — D509 Iron deficiency anemia, unspecified: Secondary | ICD-10-CM | POA: Diagnosis not present

## 2016-07-14 DIAGNOSIS — D631 Anemia in chronic kidney disease: Secondary | ICD-10-CM | POA: Diagnosis not present

## 2016-07-14 DIAGNOSIS — Z992 Dependence on renal dialysis: Secondary | ICD-10-CM | POA: Diagnosis not present

## 2016-07-14 DIAGNOSIS — N186 End stage renal disease: Secondary | ICD-10-CM | POA: Diagnosis not present

## 2016-07-14 DIAGNOSIS — N2581 Secondary hyperparathyroidism of renal origin: Secondary | ICD-10-CM | POA: Diagnosis not present

## 2016-07-17 DIAGNOSIS — D509 Iron deficiency anemia, unspecified: Secondary | ICD-10-CM | POA: Diagnosis not present

## 2016-07-17 DIAGNOSIS — N2581 Secondary hyperparathyroidism of renal origin: Secondary | ICD-10-CM | POA: Diagnosis not present

## 2016-07-17 DIAGNOSIS — N186 End stage renal disease: Secondary | ICD-10-CM | POA: Diagnosis not present

## 2016-07-17 DIAGNOSIS — Z992 Dependence on renal dialysis: Secondary | ICD-10-CM | POA: Diagnosis not present

## 2016-07-17 DIAGNOSIS — D631 Anemia in chronic kidney disease: Secondary | ICD-10-CM | POA: Diagnosis not present

## 2016-07-19 DIAGNOSIS — D509 Iron deficiency anemia, unspecified: Secondary | ICD-10-CM | POA: Diagnosis not present

## 2016-07-19 DIAGNOSIS — N186 End stage renal disease: Secondary | ICD-10-CM | POA: Diagnosis not present

## 2016-07-19 DIAGNOSIS — N2581 Secondary hyperparathyroidism of renal origin: Secondary | ICD-10-CM | POA: Diagnosis not present

## 2016-07-19 DIAGNOSIS — Z992 Dependence on renal dialysis: Secondary | ICD-10-CM | POA: Diagnosis not present

## 2016-07-19 DIAGNOSIS — D631 Anemia in chronic kidney disease: Secondary | ICD-10-CM | POA: Diagnosis not present

## 2016-07-21 DIAGNOSIS — N186 End stage renal disease: Secondary | ICD-10-CM | POA: Diagnosis not present

## 2016-07-21 DIAGNOSIS — Z992 Dependence on renal dialysis: Secondary | ICD-10-CM | POA: Diagnosis not present

## 2016-07-21 DIAGNOSIS — D631 Anemia in chronic kidney disease: Secondary | ICD-10-CM | POA: Diagnosis not present

## 2016-07-21 DIAGNOSIS — D509 Iron deficiency anemia, unspecified: Secondary | ICD-10-CM | POA: Diagnosis not present

## 2016-07-21 DIAGNOSIS — N2581 Secondary hyperparathyroidism of renal origin: Secondary | ICD-10-CM | POA: Diagnosis not present

## 2016-07-21 DIAGNOSIS — Z23 Encounter for immunization: Secondary | ICD-10-CM | POA: Diagnosis not present

## 2016-07-24 DIAGNOSIS — Z992 Dependence on renal dialysis: Secondary | ICD-10-CM | POA: Diagnosis not present

## 2016-07-24 DIAGNOSIS — N2581 Secondary hyperparathyroidism of renal origin: Secondary | ICD-10-CM | POA: Diagnosis not present

## 2016-07-24 DIAGNOSIS — D631 Anemia in chronic kidney disease: Secondary | ICD-10-CM | POA: Diagnosis not present

## 2016-07-24 DIAGNOSIS — Z23 Encounter for immunization: Secondary | ICD-10-CM | POA: Diagnosis not present

## 2016-07-24 DIAGNOSIS — D509 Iron deficiency anemia, unspecified: Secondary | ICD-10-CM | POA: Diagnosis not present

## 2016-07-24 DIAGNOSIS — N186 End stage renal disease: Secondary | ICD-10-CM | POA: Diagnosis not present

## 2016-07-26 DIAGNOSIS — Z23 Encounter for immunization: Secondary | ICD-10-CM | POA: Diagnosis not present

## 2016-07-26 DIAGNOSIS — D509 Iron deficiency anemia, unspecified: Secondary | ICD-10-CM | POA: Diagnosis not present

## 2016-07-26 DIAGNOSIS — Z992 Dependence on renal dialysis: Secondary | ICD-10-CM | POA: Diagnosis not present

## 2016-07-26 DIAGNOSIS — N2581 Secondary hyperparathyroidism of renal origin: Secondary | ICD-10-CM | POA: Diagnosis not present

## 2016-07-26 DIAGNOSIS — N186 End stage renal disease: Secondary | ICD-10-CM | POA: Diagnosis not present

## 2016-07-26 DIAGNOSIS — D631 Anemia in chronic kidney disease: Secondary | ICD-10-CM | POA: Diagnosis not present

## 2016-07-28 DIAGNOSIS — Z992 Dependence on renal dialysis: Secondary | ICD-10-CM | POA: Diagnosis not present

## 2016-07-28 DIAGNOSIS — N2581 Secondary hyperparathyroidism of renal origin: Secondary | ICD-10-CM | POA: Diagnosis not present

## 2016-07-28 DIAGNOSIS — D509 Iron deficiency anemia, unspecified: Secondary | ICD-10-CM | POA: Diagnosis not present

## 2016-07-28 DIAGNOSIS — N186 End stage renal disease: Secondary | ICD-10-CM | POA: Diagnosis not present

## 2016-07-28 DIAGNOSIS — D631 Anemia in chronic kidney disease: Secondary | ICD-10-CM | POA: Diagnosis not present

## 2016-07-28 DIAGNOSIS — Z23 Encounter for immunization: Secondary | ICD-10-CM | POA: Diagnosis not present

## 2016-07-30 DIAGNOSIS — N186 End stage renal disease: Secondary | ICD-10-CM | POA: Diagnosis not present

## 2016-07-30 DIAGNOSIS — Z992 Dependence on renal dialysis: Secondary | ICD-10-CM | POA: Diagnosis not present

## 2016-07-30 DIAGNOSIS — D509 Iron deficiency anemia, unspecified: Secondary | ICD-10-CM | POA: Diagnosis not present

## 2016-07-30 DIAGNOSIS — Z23 Encounter for immunization: Secondary | ICD-10-CM | POA: Diagnosis not present

## 2016-07-30 DIAGNOSIS — N2581 Secondary hyperparathyroidism of renal origin: Secondary | ICD-10-CM | POA: Diagnosis not present

## 2016-07-30 DIAGNOSIS — D631 Anemia in chronic kidney disease: Secondary | ICD-10-CM | POA: Diagnosis not present

## 2016-08-01 DIAGNOSIS — M169 Osteoarthritis of hip, unspecified: Secondary | ICD-10-CM | POA: Diagnosis not present

## 2016-08-01 DIAGNOSIS — I270 Primary pulmonary hypertension: Secondary | ICD-10-CM | POA: Diagnosis not present

## 2016-08-01 DIAGNOSIS — I429 Cardiomyopathy, unspecified: Secondary | ICD-10-CM | POA: Diagnosis not present

## 2016-08-01 DIAGNOSIS — D649 Anemia, unspecified: Secondary | ICD-10-CM | POA: Diagnosis not present

## 2016-08-02 DIAGNOSIS — D631 Anemia in chronic kidney disease: Secondary | ICD-10-CM | POA: Diagnosis not present

## 2016-08-02 DIAGNOSIS — N186 End stage renal disease: Secondary | ICD-10-CM | POA: Diagnosis not present

## 2016-08-02 DIAGNOSIS — Z992 Dependence on renal dialysis: Secondary | ICD-10-CM | POA: Diagnosis not present

## 2016-08-02 DIAGNOSIS — N2581 Secondary hyperparathyroidism of renal origin: Secondary | ICD-10-CM | POA: Diagnosis not present

## 2016-08-02 DIAGNOSIS — Z23 Encounter for immunization: Secondary | ICD-10-CM | POA: Diagnosis not present

## 2016-08-02 DIAGNOSIS — D509 Iron deficiency anemia, unspecified: Secondary | ICD-10-CM | POA: Diagnosis not present

## 2016-08-04 DIAGNOSIS — D631 Anemia in chronic kidney disease: Secondary | ICD-10-CM | POA: Diagnosis not present

## 2016-08-04 DIAGNOSIS — D509 Iron deficiency anemia, unspecified: Secondary | ICD-10-CM | POA: Diagnosis not present

## 2016-08-04 DIAGNOSIS — N186 End stage renal disease: Secondary | ICD-10-CM | POA: Diagnosis not present

## 2016-08-04 DIAGNOSIS — Z23 Encounter for immunization: Secondary | ICD-10-CM | POA: Diagnosis not present

## 2016-08-04 DIAGNOSIS — N2581 Secondary hyperparathyroidism of renal origin: Secondary | ICD-10-CM | POA: Diagnosis not present

## 2016-08-04 DIAGNOSIS — Z992 Dependence on renal dialysis: Secondary | ICD-10-CM | POA: Diagnosis not present

## 2016-08-07 DIAGNOSIS — N2581 Secondary hyperparathyroidism of renal origin: Secondary | ICD-10-CM | POA: Diagnosis not present

## 2016-08-07 DIAGNOSIS — D509 Iron deficiency anemia, unspecified: Secondary | ICD-10-CM | POA: Diagnosis not present

## 2016-08-07 DIAGNOSIS — D631 Anemia in chronic kidney disease: Secondary | ICD-10-CM | POA: Diagnosis not present

## 2016-08-07 DIAGNOSIS — N186 End stage renal disease: Secondary | ICD-10-CM | POA: Diagnosis not present

## 2016-08-07 DIAGNOSIS — Z23 Encounter for immunization: Secondary | ICD-10-CM | POA: Diagnosis not present

## 2016-08-07 DIAGNOSIS — Z992 Dependence on renal dialysis: Secondary | ICD-10-CM | POA: Diagnosis not present

## 2016-08-09 DIAGNOSIS — D509 Iron deficiency anemia, unspecified: Secondary | ICD-10-CM | POA: Diagnosis not present

## 2016-08-09 DIAGNOSIS — Z23 Encounter for immunization: Secondary | ICD-10-CM | POA: Diagnosis not present

## 2016-08-09 DIAGNOSIS — D631 Anemia in chronic kidney disease: Secondary | ICD-10-CM | POA: Diagnosis not present

## 2016-08-09 DIAGNOSIS — Z992 Dependence on renal dialysis: Secondary | ICD-10-CM | POA: Diagnosis not present

## 2016-08-09 DIAGNOSIS — N2581 Secondary hyperparathyroidism of renal origin: Secondary | ICD-10-CM | POA: Diagnosis not present

## 2016-08-09 DIAGNOSIS — N186 End stage renal disease: Secondary | ICD-10-CM | POA: Diagnosis not present

## 2016-08-11 DIAGNOSIS — N2581 Secondary hyperparathyroidism of renal origin: Secondary | ICD-10-CM | POA: Diagnosis not present

## 2016-08-11 DIAGNOSIS — Z992 Dependence on renal dialysis: Secondary | ICD-10-CM | POA: Diagnosis not present

## 2016-08-11 DIAGNOSIS — D509 Iron deficiency anemia, unspecified: Secondary | ICD-10-CM | POA: Diagnosis not present

## 2016-08-11 DIAGNOSIS — N186 End stage renal disease: Secondary | ICD-10-CM | POA: Diagnosis not present

## 2016-08-11 DIAGNOSIS — Z23 Encounter for immunization: Secondary | ICD-10-CM | POA: Diagnosis not present

## 2016-08-11 DIAGNOSIS — D631 Anemia in chronic kidney disease: Secondary | ICD-10-CM | POA: Diagnosis not present

## 2016-08-14 DIAGNOSIS — Z23 Encounter for immunization: Secondary | ICD-10-CM | POA: Diagnosis not present

## 2016-08-14 DIAGNOSIS — Z992 Dependence on renal dialysis: Secondary | ICD-10-CM | POA: Diagnosis not present

## 2016-08-14 DIAGNOSIS — D509 Iron deficiency anemia, unspecified: Secondary | ICD-10-CM | POA: Diagnosis not present

## 2016-08-14 DIAGNOSIS — N2581 Secondary hyperparathyroidism of renal origin: Secondary | ICD-10-CM | POA: Diagnosis not present

## 2016-08-14 DIAGNOSIS — D631 Anemia in chronic kidney disease: Secondary | ICD-10-CM | POA: Diagnosis not present

## 2016-08-14 DIAGNOSIS — N186 End stage renal disease: Secondary | ICD-10-CM | POA: Diagnosis not present

## 2016-08-16 DIAGNOSIS — Z992 Dependence on renal dialysis: Secondary | ICD-10-CM | POA: Diagnosis not present

## 2016-08-16 DIAGNOSIS — N2581 Secondary hyperparathyroidism of renal origin: Secondary | ICD-10-CM | POA: Diagnosis not present

## 2016-08-16 DIAGNOSIS — D509 Iron deficiency anemia, unspecified: Secondary | ICD-10-CM | POA: Diagnosis not present

## 2016-08-16 DIAGNOSIS — Z23 Encounter for immunization: Secondary | ICD-10-CM | POA: Diagnosis not present

## 2016-08-16 DIAGNOSIS — D631 Anemia in chronic kidney disease: Secondary | ICD-10-CM | POA: Diagnosis not present

## 2016-08-16 DIAGNOSIS — N186 End stage renal disease: Secondary | ICD-10-CM | POA: Diagnosis not present

## 2016-08-17 DIAGNOSIS — I890 Lymphedema, not elsewhere classified: Secondary | ICD-10-CM | POA: Diagnosis not present

## 2016-08-17 DIAGNOSIS — Y841 Kidney dialysis as the cause of abnormal reaction of the patient, or of later complication, without mention of misadventure at the time of the procedure: Secondary | ICD-10-CM | POA: Diagnosis not present

## 2016-08-17 DIAGNOSIS — M7989 Other specified soft tissue disorders: Secondary | ICD-10-CM | POA: Diagnosis not present

## 2016-08-17 DIAGNOSIS — Z992 Dependence on renal dialysis: Secondary | ICD-10-CM | POA: Diagnosis not present

## 2016-08-17 DIAGNOSIS — N186 End stage renal disease: Secondary | ICD-10-CM | POA: Diagnosis not present

## 2016-08-18 DIAGNOSIS — D509 Iron deficiency anemia, unspecified: Secondary | ICD-10-CM | POA: Diagnosis not present

## 2016-08-18 DIAGNOSIS — Z23 Encounter for immunization: Secondary | ICD-10-CM | POA: Diagnosis not present

## 2016-08-18 DIAGNOSIS — D631 Anemia in chronic kidney disease: Secondary | ICD-10-CM | POA: Diagnosis not present

## 2016-08-18 DIAGNOSIS — N2581 Secondary hyperparathyroidism of renal origin: Secondary | ICD-10-CM | POA: Diagnosis not present

## 2016-08-18 DIAGNOSIS — N186 End stage renal disease: Secondary | ICD-10-CM | POA: Diagnosis not present

## 2016-08-18 DIAGNOSIS — Z992 Dependence on renal dialysis: Secondary | ICD-10-CM | POA: Diagnosis not present

## 2016-08-21 ENCOUNTER — Other Ambulatory Visit: Payer: Self-pay | Admitting: Vascular Surgery

## 2016-08-21 ENCOUNTER — Encounter (INDEPENDENT_AMBULATORY_CARE_PROVIDER_SITE_OTHER): Payer: Self-pay

## 2016-08-21 DIAGNOSIS — N186 End stage renal disease: Secondary | ICD-10-CM | POA: Diagnosis not present

## 2016-08-21 DIAGNOSIS — D631 Anemia in chronic kidney disease: Secondary | ICD-10-CM | POA: Diagnosis not present

## 2016-08-21 DIAGNOSIS — D509 Iron deficiency anemia, unspecified: Secondary | ICD-10-CM | POA: Diagnosis not present

## 2016-08-21 DIAGNOSIS — N2581 Secondary hyperparathyroidism of renal origin: Secondary | ICD-10-CM | POA: Diagnosis not present

## 2016-08-21 DIAGNOSIS — Z992 Dependence on renal dialysis: Secondary | ICD-10-CM | POA: Diagnosis not present

## 2016-08-23 DIAGNOSIS — N186 End stage renal disease: Secondary | ICD-10-CM | POA: Diagnosis not present

## 2016-08-23 DIAGNOSIS — Z992 Dependence on renal dialysis: Secondary | ICD-10-CM | POA: Diagnosis not present

## 2016-08-23 DIAGNOSIS — D509 Iron deficiency anemia, unspecified: Secondary | ICD-10-CM | POA: Diagnosis not present

## 2016-08-23 DIAGNOSIS — N2581 Secondary hyperparathyroidism of renal origin: Secondary | ICD-10-CM | POA: Diagnosis not present

## 2016-08-23 DIAGNOSIS — D631 Anemia in chronic kidney disease: Secondary | ICD-10-CM | POA: Diagnosis not present

## 2016-08-25 DIAGNOSIS — N2581 Secondary hyperparathyroidism of renal origin: Secondary | ICD-10-CM | POA: Diagnosis not present

## 2016-08-25 DIAGNOSIS — N186 End stage renal disease: Secondary | ICD-10-CM | POA: Diagnosis not present

## 2016-08-25 DIAGNOSIS — D631 Anemia in chronic kidney disease: Secondary | ICD-10-CM | POA: Diagnosis not present

## 2016-08-25 DIAGNOSIS — Z992 Dependence on renal dialysis: Secondary | ICD-10-CM | POA: Diagnosis not present

## 2016-08-25 DIAGNOSIS — D509 Iron deficiency anemia, unspecified: Secondary | ICD-10-CM | POA: Diagnosis not present

## 2016-08-26 MED ORDER — DEXTROSE 5 % IV SOLN: 1.5000 g | INTRAVENOUS | Status: DC

## 2016-08-27 ENCOUNTER — Encounter: Payer: Self-pay | Admitting: *Deleted

## 2016-08-27 ENCOUNTER — Encounter
Admission: RE | Disposition: A | Discharge status: Home / Self Care | Payer: Self-pay | Source: Ambulatory Visit | Attending: Vascular Surgery

## 2016-08-27 ENCOUNTER — Ambulatory Visit
Admission: RE | Admit: 2016-08-27 | Discharge: 2016-08-27 | Disposition: A | Discharge status: Home / Self Care | Payer: Medicare Other | Source: Ambulatory Visit | Attending: Vascular Surgery | Admitting: Vascular Surgery

## 2016-08-27 DIAGNOSIS — M7989 Other specified soft tissue disorders: Secondary | ICD-10-CM | POA: Insufficient documentation

## 2016-08-27 DIAGNOSIS — Z8249 Family history of ischemic heart disease and other diseases of the circulatory system: Secondary | ICD-10-CM | POA: Diagnosis not present

## 2016-08-27 DIAGNOSIS — Y832 Surgical operation with anastomosis, bypass or graft as the cause of abnormal reaction of the patient, or of later complication, without mention of misadventure at the time of the procedure: Secondary | ICD-10-CM | POA: Diagnosis not present

## 2016-08-27 DIAGNOSIS — Z841 Family history of disorders of kidney and ureter: Secondary | ICD-10-CM | POA: Insufficient documentation

## 2016-08-27 DIAGNOSIS — M79609 Pain in unspecified limb: Secondary | ICD-10-CM | POA: Diagnosis not present

## 2016-08-27 DIAGNOSIS — I120 Hypertensive chronic kidney disease with stage 5 chronic kidney disease or end stage renal disease: Secondary | ICD-10-CM | POA: Insufficient documentation

## 2016-08-27 DIAGNOSIS — Z992 Dependence on renal dialysis: Secondary | ICD-10-CM | POA: Diagnosis not present

## 2016-08-27 DIAGNOSIS — N186 End stage renal disease: Secondary | ICD-10-CM | POA: Diagnosis not present

## 2016-08-27 DIAGNOSIS — Z823 Family history of stroke: Secondary | ICD-10-CM | POA: Diagnosis not present

## 2016-08-27 DIAGNOSIS — T82858A Stenosis of vascular prosthetic devices, implants and grafts, initial encounter: Secondary | ICD-10-CM | POA: Diagnosis not present

## 2016-08-27 DIAGNOSIS — I871 Compression of vein: Secondary | ICD-10-CM | POA: Diagnosis not present

## 2016-08-27 DIAGNOSIS — I721 Aneurysm of artery of upper extremity: Secondary | ICD-10-CM | POA: Diagnosis not present

## 2016-08-27 DIAGNOSIS — Z7982 Long term (current) use of aspirin: Secondary | ICD-10-CM | POA: Insufficient documentation

## 2016-08-27 HISTORY — PX: PERIPHERAL VASCULAR CATHETERIZATION: SHX172C

## 2016-08-27 LAB — POTASSIUM (ARMC VASCULAR LAB ONLY): Potassium (ARMC vascular lab): 3.7 (ref 3.5–5.1)

## 2016-08-27 SURGERY — A/V SHUNTOGRAM/FISTULAGRAM
Anesthesia: Moderate Sedation | Site: Thigh | Laterality: Right

## 2016-08-27 MED ORDER — MIDAZOLAM HCL 2 MG/2ML IJ SOLN
INTRAMUSCULAR | Status: DC | PRN
  Administered 2016-08-27: 2 mg via INTRAVENOUS

## 2016-08-27 MED ORDER — HEPARIN SODIUM (PORCINE) 1000 UNIT/ML IJ SOLN
INTRAMUSCULAR | Status: DC | PRN
  Administered 2016-08-27: 3000 [IU] via INTRAVENOUS

## 2016-08-27 MED ORDER — HEPARIN (PORCINE) IN NACL 2-0.9 UNIT/ML-% IJ SOLN
INTRAMUSCULAR | Status: AC
  Filled 2016-08-27: qty 1000

## 2016-08-27 MED ORDER — HEPARIN SODIUM (PORCINE) 1000 UNIT/ML IJ SOLN
INTRAMUSCULAR | Status: AC
  Filled 2016-08-27: qty 1

## 2016-08-27 MED ORDER — ONDANSETRON HCL 4 MG/2ML IJ SOLN: 4.0000 mg | Freq: Four times a day (QID) | INTRAMUSCULAR | Status: DC | PRN

## 2016-08-27 MED ORDER — LIDOCAINE-EPINEPHRINE (PF) 1 %-1:200000 IJ SOLN
INTRAMUSCULAR | Status: AC
  Filled 2016-08-27: qty 30

## 2016-08-27 MED ORDER — MIDAZOLAM HCL 5 MG/5ML IJ SOLN
INTRAMUSCULAR | Status: AC
  Filled 2016-08-27: qty 5

## 2016-08-27 MED ORDER — FENTANYL CITRATE (PF) 100 MCG/2ML IJ SOLN
INTRAMUSCULAR | Status: DC | PRN
  Administered 2016-08-27: 50 ug via INTRAVENOUS

## 2016-08-27 MED ORDER — FAMOTIDINE 20 MG PO TABS: 40.0000 mg | ORAL_TABLET | ORAL | Status: DC | PRN

## 2016-08-27 MED ORDER — SODIUM CHLORIDE 0.9 % IV SOLN
INTRAVENOUS | Status: DC
  Administered 2016-08-27: 08:00:00 via INTRAVENOUS

## 2016-08-27 MED ORDER — METHYLPREDNISOLONE SODIUM SUCC 125 MG IJ SOLR: 125.0000 mg | INTRAMUSCULAR | Status: DC | PRN

## 2016-08-27 MED ORDER — FENTANYL CITRATE (PF) 100 MCG/2ML IJ SOLN
INTRAMUSCULAR | Status: AC
  Filled 2016-08-27: qty 2

## 2016-08-27 SURGICAL SUPPLY — 13 items
BALLN ARMADA 12X80X80 (BALLOONS) ×2
BALLN LUTONIX DCB 7X60X130 (BALLOONS) ×2
BALLN ULTRVRSE 10X60X75 (BALLOONS) ×2
BALLOON ARMADA 12X80X80 (BALLOONS) IMPLANT
BALLOON LUTONIX DCB 7X60X130 (BALLOONS) IMPLANT
BALLOON ULTRVRSE 10X60X75 (BALLOONS) IMPLANT
CANNULA 5F STIFF (CANNULA) ×2 IMPLANT
DEVICE PRESTO INFLATION (MISCELLANEOUS) ×1 IMPLANT
DRAPE BRACHIAL (DRAPES) ×2 IMPLANT
PACK ANGIOGRAPHY (CUSTOM PROCEDURE TRAY) ×2 IMPLANT
SHEATH BRITE TIP 6FRX5.5 (SHEATH) ×2 IMPLANT
TOWEL OR 17X26 4PK STRL BLUE (TOWEL DISPOSABLE) ×2 IMPLANT
WIRE MAGIC TORQUE 260C (WIRE) ×1 IMPLANT

## 2016-08-27 NOTE — H&P (Signed)
[facility] History & Physical Update  The patient was interviewed and re-examined.  The patient's previous History and Physical has been reviewed and is unchanged.  There is no change in the plan of care. We plan to proceed with the scheduled procedure.  Leotis Pain, MD  08/27/2016, 8:41 AM

## 2016-08-27 NOTE — Op Note (Signed)
[name]    OPERATIVE NOTE   PROCEDURE: 1.  Right femoral artery to femoral vein arteriovenous graft cannulation under ultrasound guidance 2.  Right thigh  shuntogram 3.  Percutaneous transluminal angioplasty of the venous anastomosis with 7 mm diameter by 6 cm length Lutonix drug-coated balloon 4.  Percutaneous transluminal angioplasty of the right external and common iliac veins with 10 and 12 mm diameter angioplasty balloons  PRE-OPERATIVE DIAGNOSIS: 1. ESRD 2. Malfunctioning right thigh arteriovenous graft with marked leg swelling and aneurysmal graft  POST-OPERATIVE DIAGNOSIS: same as above   SURGEON: Leotis Pain, MD  ANESTHESIA: local with moderate conscious sedation for approximately 30 minutes using 2 mg of Versed and 50 g of fentanyl  ESTIMATED BLOOD LOSS: Minimal  FINDING(S): 1. Approximally 70-75% stenosis of the venous anastomosis with greater than 90% stenosis of the common and external iliac veins near the hypogastric vein  SPECIMEN(S):  None  CONTRAST: 30 cc  FLUORO TIME: 2.6 minutes  MODERATE CONSCIOUS SEDATION TIME:  Approximately 30 minutes using 2 mg of Versed and 50 mcg of Fentanyl  INDICATIONS: [name] is a 65 y.o. female who presents with malfunctioning right femoral artery to femoral vein arteriovenous graft. She has marked leg swelling and her graft has developed some aneurysmal areas. The patient is scheduled for  right thigh shuntogram.  The patient is aware the risks include but are not limited to: bleeding, infection, thrombosis of the cannulated access, and possible anaphylactic reaction to the contrast.  The patient is aware of the risks of the procedure and elects to proceed forward.  DESCRIPTION: After full informed written consent was obtained, the patient was brought back to the angiography suite and placed supine upon the angiography table.  The patient was connected to monitoring equipment. Moderate conscious  sedation was administered during a face to face encounter throughout the procedure with my supervision of the RN administering medicines and monitoring the patient's vital signs, pulse oximetry, telemetry and mental status throughout from the start of the procedure until the patient was taken to the recovery room The  right thigh was prepped and draped in the standard fashion for a percutaneous access intervention.  Under ultrasound guidance, the right femoral artery to femoral vein arteriovenous graft was cannulated with a micropuncture needle under direct ultrasound guidance and a permanent image was performed.  The microwire was advanced into the fistula and the needle was exchanged for the a microsheath.  I then upsized to a 6 Fr Sheath and imaging was performed.  Hand injections were completed to image the access including the central venous system. This demonstrated 70-75% stenosis of the venous anastomosis with greater than 90% stenosis of the common and external iliac veins near the hypogastric vein.  Based on the images, this patient will need intervention to both of these areas. I then gave the patient 3000 units of intravenous heparin.  I then crossed the stenosis with a Magic Tourqe wire.  Based on the imaging, a 7 mm x 6 cm  Lutonix drug-coated angioplasty balloon was selected.  The balloon was centered around the venous anastomotic stenosis and inflated to 12 ATM for 1 minute(s).  On completion imaging, a 20-25 % residual stenosis was present.   I then turned my attention to the more central stenosis in the iliac veins. Initially, a 10 mm diameter by 8 cm length angioplasty balloon was selected. This was centered around the common and external iliac artery stenoses and inflated to 8  atm for 1 minute. This was slightly undersized in about a 50% residual stenosis remained. Upsized to a 12 mm diameter by 6 cm length angioplasty balloon and performed 2 inflations to treat the entirety of the stenosis  with the inflations being from 6-8 atm for 1 minute. Completion angiogram showed only about a 20% residual stenosis in the common or external iliac veins with brisk flow.  Based on the completion imaging, no further intervention is necessary.  The wire and balloon were removed from the sheath.  A 4-0 Monocryl purse-string suture was sewn around the sheath.  The sheath was removed while tying down the suture.  A sterile bandage was applied to the puncture site.  COMPLICATIONS: None  CONDITION: Stable   Leotis Pain  08/27/2016 9:32 AM    This note was created with Dragon Medical transcription system. Any errors in dictation are purely unintentional.

## 2016-08-27 NOTE — Discharge Instructions (Signed)
Fistulogram, Care After °Refer to this sheet in the next few weeks. These instructions provide you with information on caring for yourself after your procedure. Your health care provider may also give you more specific instructions. Your treatment has been planned according to current medical practices, but problems sometimes occur. Call your health care provider if you have any problems or questions after your procedure. °WHAT TO EXPECT AFTER THE PROCEDURE °After your procedure, it is typical to have the following: °· A small amount of discomfort in the area where the catheters were placed. °· A small amount of bruising around the fistula. °· Sleepiness and fatigue. °HOME CARE INSTRUCTIONS °· Rest at home for the day following your procedure. °· Do not drive or operate heavy machinery while taking pain medicine. °· Take medicines only as directed by your health care provider. °· Do not take baths, swim, or use a hot tub until your health care provider approves. You may shower 24 hours after the procedure or as directed by your health care provider. °· There are many different ways to close and cover an incision, including stitches, skin glue, and adhesive strips. Follow your health care provider's instructions on: °¨ Incision care. °¨ Bandage (dressing) changes and removal. °¨ Incision closure removal. °· Monitor your dialysis fistula carefully. °SEEK MEDICAL CARE IF: °· You have drainage, redness, swelling, or pain at your catheter site. °· You have a fever. °· You have chills. °SEEK IMMEDIATE MEDICAL CARE IF: °· You feel weak. °· You have trouble balancing. °· You have trouble moving your arms or legs. °· You have problems with your speech or vision. °· You can no longer feel a vibration or buzz when you put your fingers over your dialysis fistula. °· The limb that was used for the procedure: °¨ Swells. °¨ Is painful. °¨ Is cold. °¨ Is discolored, such as blue or pale white. °  °This information is not intended  to replace advice given to you by your health care provider. Make sure you discuss any questions you have with your health care provider. °  °Document Released: 03/22/2014 Document Reviewed: 03/22/2014 °Elsevier Interactive Patient Education ©2016 [employer] ° °

## 2016-08-28 ENCOUNTER — Encounter: Payer: Self-pay | Admitting: Vascular Surgery

## 2016-08-28 DIAGNOSIS — D509 Iron deficiency anemia, unspecified: Secondary | ICD-10-CM | POA: Diagnosis not present

## 2016-08-28 DIAGNOSIS — N186 End stage renal disease: Secondary | ICD-10-CM | POA: Diagnosis not present

## 2016-08-28 DIAGNOSIS — D631 Anemia in chronic kidney disease: Secondary | ICD-10-CM | POA: Diagnosis not present

## 2016-08-28 DIAGNOSIS — Z992 Dependence on renal dialysis: Secondary | ICD-10-CM | POA: Diagnosis not present

## 2016-08-28 DIAGNOSIS — N2581 Secondary hyperparathyroidism of renal origin: Secondary | ICD-10-CM | POA: Diagnosis not present

## 2016-08-30 DIAGNOSIS — Z992 Dependence on renal dialysis: Secondary | ICD-10-CM | POA: Diagnosis not present

## 2016-08-30 DIAGNOSIS — N186 End stage renal disease: Secondary | ICD-10-CM | POA: Diagnosis not present

## 2016-08-30 DIAGNOSIS — N2581 Secondary hyperparathyroidism of renal origin: Secondary | ICD-10-CM | POA: Diagnosis not present

## 2016-08-30 DIAGNOSIS — D509 Iron deficiency anemia, unspecified: Secondary | ICD-10-CM | POA: Diagnosis not present

## 2016-08-30 DIAGNOSIS — D631 Anemia in chronic kidney disease: Secondary | ICD-10-CM | POA: Diagnosis not present

## 2016-09-01 DIAGNOSIS — N186 End stage renal disease: Secondary | ICD-10-CM | POA: Diagnosis not present

## 2016-09-01 DIAGNOSIS — D509 Iron deficiency anemia, unspecified: Secondary | ICD-10-CM | POA: Diagnosis not present

## 2016-09-01 DIAGNOSIS — N2581 Secondary hyperparathyroidism of renal origin: Secondary | ICD-10-CM | POA: Diagnosis not present

## 2016-09-01 DIAGNOSIS — Z992 Dependence on renal dialysis: Secondary | ICD-10-CM | POA: Diagnosis not present

## 2016-09-01 DIAGNOSIS — D631 Anemia in chronic kidney disease: Secondary | ICD-10-CM | POA: Diagnosis not present

## 2016-09-04 DIAGNOSIS — D509 Iron deficiency anemia, unspecified: Secondary | ICD-10-CM | POA: Diagnosis not present

## 2016-09-04 DIAGNOSIS — N186 End stage renal disease: Secondary | ICD-10-CM | POA: Diagnosis not present

## 2016-09-04 DIAGNOSIS — D631 Anemia in chronic kidney disease: Secondary | ICD-10-CM | POA: Diagnosis not present

## 2016-09-04 DIAGNOSIS — N2581 Secondary hyperparathyroidism of renal origin: Secondary | ICD-10-CM | POA: Diagnosis not present

## 2016-09-04 DIAGNOSIS — E7800 Pure hypercholesterolemia, unspecified: Secondary | ICD-10-CM | POA: Diagnosis not present

## 2016-09-04 DIAGNOSIS — Z992 Dependence on renal dialysis: Secondary | ICD-10-CM | POA: Diagnosis not present

## 2016-09-04 DIAGNOSIS — I10 Essential (primary) hypertension: Secondary | ICD-10-CM | POA: Diagnosis not present

## 2016-09-06 DIAGNOSIS — N186 End stage renal disease: Secondary | ICD-10-CM | POA: Diagnosis not present

## 2016-09-06 DIAGNOSIS — D509 Iron deficiency anemia, unspecified: Secondary | ICD-10-CM | POA: Diagnosis not present

## 2016-09-06 DIAGNOSIS — Z992 Dependence on renal dialysis: Secondary | ICD-10-CM | POA: Diagnosis not present

## 2016-09-06 DIAGNOSIS — N2581 Secondary hyperparathyroidism of renal origin: Secondary | ICD-10-CM | POA: Diagnosis not present

## 2016-09-06 DIAGNOSIS — D631 Anemia in chronic kidney disease: Secondary | ICD-10-CM | POA: Diagnosis not present

## 2016-09-08 DIAGNOSIS — Z992 Dependence on renal dialysis: Secondary | ICD-10-CM | POA: Diagnosis not present

## 2016-09-08 DIAGNOSIS — D631 Anemia in chronic kidney disease: Secondary | ICD-10-CM | POA: Diagnosis not present

## 2016-09-08 DIAGNOSIS — N186 End stage renal disease: Secondary | ICD-10-CM | POA: Diagnosis not present

## 2016-09-08 DIAGNOSIS — D509 Iron deficiency anemia, unspecified: Secondary | ICD-10-CM | POA: Diagnosis not present

## 2016-09-08 DIAGNOSIS — N2581 Secondary hyperparathyroidism of renal origin: Secondary | ICD-10-CM | POA: Diagnosis not present

## 2016-09-11 DIAGNOSIS — Z992 Dependence on renal dialysis: Secondary | ICD-10-CM | POA: Diagnosis not present

## 2016-09-11 DIAGNOSIS — D631 Anemia in chronic kidney disease: Secondary | ICD-10-CM | POA: Diagnosis not present

## 2016-09-11 DIAGNOSIS — N186 End stage renal disease: Secondary | ICD-10-CM | POA: Diagnosis not present

## 2016-09-11 DIAGNOSIS — D509 Iron deficiency anemia, unspecified: Secondary | ICD-10-CM | POA: Diagnosis not present

## 2016-09-11 DIAGNOSIS — N2581 Secondary hyperparathyroidism of renal origin: Secondary | ICD-10-CM | POA: Diagnosis not present

## 2016-09-13 DIAGNOSIS — N186 End stage renal disease: Secondary | ICD-10-CM | POA: Diagnosis not present

## 2016-09-13 DIAGNOSIS — D631 Anemia in chronic kidney disease: Secondary | ICD-10-CM | POA: Diagnosis not present

## 2016-09-13 DIAGNOSIS — N2581 Secondary hyperparathyroidism of renal origin: Secondary | ICD-10-CM | POA: Diagnosis not present

## 2016-09-13 DIAGNOSIS — Z992 Dependence on renal dialysis: Secondary | ICD-10-CM | POA: Diagnosis not present

## 2016-09-13 DIAGNOSIS — D509 Iron deficiency anemia, unspecified: Secondary | ICD-10-CM | POA: Diagnosis not present

## 2016-09-15 DIAGNOSIS — N2581 Secondary hyperparathyroidism of renal origin: Secondary | ICD-10-CM | POA: Diagnosis not present

## 2016-09-15 DIAGNOSIS — N186 End stage renal disease: Secondary | ICD-10-CM | POA: Diagnosis not present

## 2016-09-15 DIAGNOSIS — Z992 Dependence on renal dialysis: Secondary | ICD-10-CM | POA: Diagnosis not present

## 2016-09-15 DIAGNOSIS — D631 Anemia in chronic kidney disease: Secondary | ICD-10-CM | POA: Diagnosis not present

## 2016-09-15 DIAGNOSIS — D509 Iron deficiency anemia, unspecified: Secondary | ICD-10-CM | POA: Diagnosis not present

## 2016-09-18 DIAGNOSIS — N2581 Secondary hyperparathyroidism of renal origin: Secondary | ICD-10-CM | POA: Diagnosis not present

## 2016-09-18 DIAGNOSIS — Z992 Dependence on renal dialysis: Secondary | ICD-10-CM | POA: Diagnosis not present

## 2016-09-18 DIAGNOSIS — D631 Anemia in chronic kidney disease: Secondary | ICD-10-CM | POA: Diagnosis not present

## 2016-09-18 DIAGNOSIS — D509 Iron deficiency anemia, unspecified: Secondary | ICD-10-CM | POA: Diagnosis not present

## 2016-09-18 DIAGNOSIS — N186 End stage renal disease: Secondary | ICD-10-CM | POA: Diagnosis not present

## 2016-09-20 DIAGNOSIS — N2581 Secondary hyperparathyroidism of renal origin: Secondary | ICD-10-CM | POA: Diagnosis not present

## 2016-09-20 DIAGNOSIS — D509 Iron deficiency anemia, unspecified: Secondary | ICD-10-CM | POA: Diagnosis not present

## 2016-09-20 DIAGNOSIS — Z992 Dependence on renal dialysis: Secondary | ICD-10-CM | POA: Diagnosis not present

## 2016-09-20 DIAGNOSIS — N186 End stage renal disease: Secondary | ICD-10-CM | POA: Diagnosis not present

## 2016-09-20 DIAGNOSIS — D631 Anemia in chronic kidney disease: Secondary | ICD-10-CM | POA: Diagnosis not present

## 2016-09-22 DIAGNOSIS — D631 Anemia in chronic kidney disease: Secondary | ICD-10-CM | POA: Diagnosis not present

## 2016-09-22 DIAGNOSIS — Z992 Dependence on renal dialysis: Secondary | ICD-10-CM | POA: Diagnosis not present

## 2016-09-22 DIAGNOSIS — N186 End stage renal disease: Secondary | ICD-10-CM | POA: Diagnosis not present

## 2016-09-22 DIAGNOSIS — N2581 Secondary hyperparathyroidism of renal origin: Secondary | ICD-10-CM | POA: Diagnosis not present

## 2016-09-22 DIAGNOSIS — D509 Iron deficiency anemia, unspecified: Secondary | ICD-10-CM | POA: Diagnosis not present

## 2016-09-25 DIAGNOSIS — Z992 Dependence on renal dialysis: Secondary | ICD-10-CM | POA: Diagnosis not present

## 2016-09-25 DIAGNOSIS — D509 Iron deficiency anemia, unspecified: Secondary | ICD-10-CM | POA: Diagnosis not present

## 2016-09-25 DIAGNOSIS — N2581 Secondary hyperparathyroidism of renal origin: Secondary | ICD-10-CM | POA: Diagnosis not present

## 2016-09-25 DIAGNOSIS — N186 End stage renal disease: Secondary | ICD-10-CM | POA: Diagnosis not present

## 2016-09-25 DIAGNOSIS — D631 Anemia in chronic kidney disease: Secondary | ICD-10-CM | POA: Diagnosis not present

## 2016-09-27 DIAGNOSIS — N2581 Secondary hyperparathyroidism of renal origin: Secondary | ICD-10-CM | POA: Diagnosis not present

## 2016-09-27 DIAGNOSIS — D631 Anemia in chronic kidney disease: Secondary | ICD-10-CM | POA: Diagnosis not present

## 2016-09-27 DIAGNOSIS — Z992 Dependence on renal dialysis: Secondary | ICD-10-CM | POA: Diagnosis not present

## 2016-09-27 DIAGNOSIS — D509 Iron deficiency anemia, unspecified: Secondary | ICD-10-CM | POA: Diagnosis not present

## 2016-09-27 DIAGNOSIS — N186 End stage renal disease: Secondary | ICD-10-CM | POA: Diagnosis not present

## 2016-09-29 DIAGNOSIS — N2581 Secondary hyperparathyroidism of renal origin: Secondary | ICD-10-CM | POA: Diagnosis not present

## 2016-09-29 DIAGNOSIS — N186 End stage renal disease: Secondary | ICD-10-CM | POA: Diagnosis not present

## 2016-09-29 DIAGNOSIS — D509 Iron deficiency anemia, unspecified: Secondary | ICD-10-CM | POA: Diagnosis not present

## 2016-09-29 DIAGNOSIS — D631 Anemia in chronic kidney disease: Secondary | ICD-10-CM | POA: Diagnosis not present

## 2016-09-29 DIAGNOSIS — Z992 Dependence on renal dialysis: Secondary | ICD-10-CM | POA: Diagnosis not present

## 2016-10-02 DIAGNOSIS — N2581 Secondary hyperparathyroidism of renal origin: Secondary | ICD-10-CM | POA: Diagnosis not present

## 2016-10-02 DIAGNOSIS — D509 Iron deficiency anemia, unspecified: Secondary | ICD-10-CM | POA: Diagnosis not present

## 2016-10-02 DIAGNOSIS — Z992 Dependence on renal dialysis: Secondary | ICD-10-CM | POA: Diagnosis not present

## 2016-10-02 DIAGNOSIS — N186 End stage renal disease: Secondary | ICD-10-CM | POA: Diagnosis not present

## 2016-10-02 DIAGNOSIS — D631 Anemia in chronic kidney disease: Secondary | ICD-10-CM | POA: Diagnosis not present

## 2016-10-04 DIAGNOSIS — D509 Iron deficiency anemia, unspecified: Secondary | ICD-10-CM | POA: Diagnosis not present

## 2016-10-04 DIAGNOSIS — N186 End stage renal disease: Secondary | ICD-10-CM | POA: Diagnosis not present

## 2016-10-04 DIAGNOSIS — Z992 Dependence on renal dialysis: Secondary | ICD-10-CM | POA: Diagnosis not present

## 2016-10-04 DIAGNOSIS — N2581 Secondary hyperparathyroidism of renal origin: Secondary | ICD-10-CM | POA: Diagnosis not present

## 2016-10-04 DIAGNOSIS — D631 Anemia in chronic kidney disease: Secondary | ICD-10-CM | POA: Diagnosis not present

## 2016-10-06 DIAGNOSIS — Z992 Dependence on renal dialysis: Secondary | ICD-10-CM | POA: Diagnosis not present

## 2016-10-06 DIAGNOSIS — D631 Anemia in chronic kidney disease: Secondary | ICD-10-CM | POA: Diagnosis not present

## 2016-10-06 DIAGNOSIS — N2581 Secondary hyperparathyroidism of renal origin: Secondary | ICD-10-CM | POA: Diagnosis not present

## 2016-10-06 DIAGNOSIS — N186 End stage renal disease: Secondary | ICD-10-CM | POA: Diagnosis not present

## 2016-10-06 DIAGNOSIS — D509 Iron deficiency anemia, unspecified: Secondary | ICD-10-CM | POA: Diagnosis not present

## 2016-10-08 ENCOUNTER — Other Ambulatory Visit (INDEPENDENT_AMBULATORY_CARE_PROVIDER_SITE_OTHER): Payer: Medicare Other

## 2016-10-08 ENCOUNTER — Other Ambulatory Visit (INDEPENDENT_AMBULATORY_CARE_PROVIDER_SITE_OTHER): Payer: Self-pay | Admitting: Vascular Surgery

## 2016-10-08 ENCOUNTER — Encounter (INDEPENDENT_AMBULATORY_CARE_PROVIDER_SITE_OTHER): Payer: Self-pay | Admitting: Vascular Surgery

## 2016-10-08 ENCOUNTER — Ambulatory Visit (INDEPENDENT_AMBULATORY_CARE_PROVIDER_SITE_OTHER): Payer: Medicare Other | Admitting: Vascular Surgery

## 2016-10-08 VITALS — BP 129/64 | HR 74 | Resp 16 | Ht 64.0 in | Wt 137.0 lb

## 2016-10-08 DIAGNOSIS — N186 End stage renal disease: Secondary | ICD-10-CM

## 2016-10-08 DIAGNOSIS — T829XXD Unspecified complication of cardiac and vascular prosthetic device, implant and graft, subsequent encounter: Secondary | ICD-10-CM

## 2016-10-08 DIAGNOSIS — Z992 Dependence on renal dialysis: Secondary | ICD-10-CM

## 2016-10-08 DIAGNOSIS — E782 Mixed hyperlipidemia: Secondary | ICD-10-CM

## 2016-10-09 DIAGNOSIS — N186 End stage renal disease: Secondary | ICD-10-CM | POA: Diagnosis not present

## 2016-10-09 DIAGNOSIS — D509 Iron deficiency anemia, unspecified: Secondary | ICD-10-CM | POA: Diagnosis not present

## 2016-10-09 DIAGNOSIS — D631 Anemia in chronic kidney disease: Secondary | ICD-10-CM | POA: Diagnosis not present

## 2016-10-09 DIAGNOSIS — Z992 Dependence on renal dialysis: Secondary | ICD-10-CM | POA: Diagnosis not present

## 2016-10-09 DIAGNOSIS — N2581 Secondary hyperparathyroidism of renal origin: Secondary | ICD-10-CM | POA: Diagnosis not present

## 2016-10-09 DIAGNOSIS — T829XXA Unspecified complication of cardiac and vascular prosthetic device, implant and graft, initial encounter: Secondary | ICD-10-CM | POA: Insufficient documentation

## 2016-10-09 NOTE — Progress Notes (Signed)
Subjective:    Patient ID: [name], female    DOB: 1951-01-30, 65 y.o.   MRN: [id number] Chief Complaint  Patient presents with  . Follow-up    Patient presents for her first post-procedure follow up. She is s/p a right thigh graft shuntogram for a poorly functioning graft on 08/27/16. Patient reports improvement in the function of her graft. Duplex today shows a patent graft uniform velocities. Patient reports his hemodialysis doppler flow is improved. The patient denies any issues with hemodialysis such as cannulation problems, increased bleeding, decrease in doppler flow or recirculation. The patient also denies any graft skin breakdown, pain, edema, pallor or ulceration of the lower extremity.    Review of Systems  Constitutional: Negative.   HENT: Negative.   Eyes: Negative.   Respiratory: Negative.   Cardiovascular: Negative.   Gastrointestinal: Negative.   Endocrine: Negative.   Genitourinary:       ESRD  Musculoskeletal: Negative.   Allergic/Immunologic: Negative.   Neurological: Negative.   Hematological: Negative.   Psychiatric/Behavioral: Negative.        Objective:   Physical Exam  Constitutional: She is oriented to person, place, and time. She appears well-developed and well-nourished.  HENT:  Head: Normocephalic and atraumatic.  Right Ear: External ear normal.  Left Ear: External ear normal.  Eyes: Conjunctivae and EOM are normal. Pupils are equal, round, and reactive to light.  Neck: Normal range of motion.  Cardiovascular: Normal rate, regular rhythm, normal heart sounds and intact distal pulses.   Pulses:      Radial pulses are 2+ on the right side, and 2+ on the left side.       Dorsalis pedis pulses are 2+ on the right side, and 2+ on the left side.       Posterior tibial pulses are 2+ on the right side, and 2+ on the left side.  Right Thigh Graft: Skin intact. Good thrill and bruit.  Pulmonary/Chest: Effort normal and breath sounds normal.    Abdominal: Soft. Bowel sounds are normal.  Musculoskeletal: Normal range of motion.  Neurological: She is alert and oriented to person, place, and time.  Skin: Skin is warm and dry.  Psychiatric: She has a normal mood and affect. Her behavior is normal. Judgment and thought content normal.   BP 129/64   Pulse 74   Resp 16   Ht 5\' 4"  (1.626 m)   Wt 137 lb (62.1 kg)   BMI 23.52 kg/m   Past Medical History:  Diagnosis Date  . Chronic systolic heart failure ([place])   . Coronary atherosclerosis of native coronary artery   . Diseases of tricuspid valve   . End stage renal disease ([place])   . Mitral valve insufficiency and aortic valve insufficiency   . Other and unspecified hyperlipidemia   . Secondary cardiomyopathy, unspecified   . Unspecified essential hypertension    Social History   Social History  . Marital status: Single    Spouse name: N/A  . Number of children: N/A  . Years of education: N/A   Occupational History  . Not on file.   Social History Main Topics  . Smoking status: Never Smoker  . Smokeless tobacco: Never Used  . Alcohol use No  . Drug use: No  . Sexual activity: Not on file   Other Topics Concern  . Not on file   Social History Narrative  . No narrative on file   Past Surgical History:  Procedure Laterality Date  .  AV FISTULA PLACEMENT Right 2012  . AV FISTULA PLACEMENT Left 2004  . INSERTION OF DIALYSIS CATHETER N/A 01/08/2013   Procedure: INSERTION OF DIALYSIS CATHETER;  Surgeon: Angelia Mould, MD;  Location: [place];  Service: Vascular;  Laterality: N/A;  . KIDNEY TRANSPLANT Left 2002   pt. states transplant lasted 2 yrs.  Marland Kitchen PERIPHERAL VASCULAR CATHETERIZATION Right 08/27/2016   Procedure: A/V Shuntogram/Fistulagram;  Surgeon: Algernon Huxley, MD;  Location: [place] [facility];  Service: Cardiovascular;  Laterality: Right;   History reviewed. No pertinent family history.  Allergies  Allergen Reactions  . Dilaudid [Hydromorphone]  Nausea And Vomiting  . Tape Rash    Plastic tape      Assessment & Plan:  Patient presents for her first post-procedure follow up. She is s/p a right thigh graft shuntogram for a poorly functioning graft on 08/27/16. Patient reports improvement in the function of her graft. Duplex today shows a patent graft uniform velocities. Patient reports his hemodialysis doppler flow is improved. The patient denies any issues with hemodialysis such as cannulation problems, increased bleeding, decrease in doppler flow or recirculation. The patient also denies any graft skin breakdown, pain, edema, pallor or ulceration of the lower extremity.   1. ESRD on dialysis [facility]) - Improved The patient is doing well and currently has adequate dialysis access. Duplex ultrasound of the AV access shows a patent access with no evidence of hemodynamically significant strictures or stenosis.  The patient should continue to have duplex ultrasounds of the dialysis access every six months. The patient was instructed to call the office in the interim if any issues with dialysis access / doppler flow, pain, edema, pallor, fistula skin breakdown or ulceration of the arm / hand occur. The patient expressed their understanding.  - VAS US DUPLEX DIALYSIS ACCESS (AVF,AVG); Future  2. Complication from renal dialysis device, subsequent encounter - Improved Function improved after intervention.   3. Mixed hyperlipidemia - Stable Encouraged good control as its slows the progression of atherosclerotic disease  Current Outpatient Prescriptions on File Prior to Visit  Medication Sig Dispense Refill  . acetaminophen (TYLENOL) 500 MG tablet Take 1,000 mg by mouth every 6 (six) hours as needed for pain (headache).    Marland Kitchen aspirin 81 MG tablet Take 81 mg by mouth daily.    . B Complex-C-Zn-Folic Acid (NEPHPLEX RX PO) Take 1 tablet by mouth daily.    . cinacalcet (SENSIPAR) 30 MG tablet Take 30 mg by mouth daily after supper.    . isosorbide  mononitrate (IMDUR) 30 MG 24 hr tablet Take 30 mg by mouth daily.    Marland Kitchen labetalol (NORMODYNE) 300 MG tablet Take 300 mg by mouth 2 (two) times daily.    . minoxidil (LONITEN) 2.5 MG tablet Take 2.5 mg by mouth 2 (two) times daily.    . sevelamer (RENAGEL) 800 MG tablet Take 800-1,600 mg by mouth See admin instructions. Take 2 tablets by mouth with meals and 1 with snacks.    . simvastatin (ZOCOR) 20 MG tablet Take 20 mg by mouth daily.    Marland Kitchen oxyCODONE-acetaminophen (ROXICET) 5-325 MG per tablet Take 1-2 tablets by mouth every 4 (four) hours as needed for pain. (Patient not taking: Reported on 10/08/2016) 20 tablet 0   No current facility-administered medications on file prior to visit.     There are no Patient Instructions on file for this visit. Return in about 6 months (around 04/07/2017) for HDA follow up.   KIMBERLY A STEGMAYER, PA-C

## 2016-10-11 DIAGNOSIS — D631 Anemia in chronic kidney disease: Secondary | ICD-10-CM | POA: Diagnosis not present

## 2016-10-11 DIAGNOSIS — N2581 Secondary hyperparathyroidism of renal origin: Secondary | ICD-10-CM | POA: Diagnosis not present

## 2016-10-11 DIAGNOSIS — N186 End stage renal disease: Secondary | ICD-10-CM | POA: Diagnosis not present

## 2016-10-11 DIAGNOSIS — D509 Iron deficiency anemia, unspecified: Secondary | ICD-10-CM | POA: Diagnosis not present

## 2016-10-11 DIAGNOSIS — Z992 Dependence on renal dialysis: Secondary | ICD-10-CM | POA: Diagnosis not present

## 2016-10-13 DIAGNOSIS — D631 Anemia in chronic kidney disease: Secondary | ICD-10-CM | POA: Diagnosis not present

## 2016-10-13 DIAGNOSIS — N186 End stage renal disease: Secondary | ICD-10-CM | POA: Diagnosis not present

## 2016-10-13 DIAGNOSIS — D509 Iron deficiency anemia, unspecified: Secondary | ICD-10-CM | POA: Diagnosis not present

## 2016-10-13 DIAGNOSIS — Z992 Dependence on renal dialysis: Secondary | ICD-10-CM | POA: Diagnosis not present

## 2016-10-13 DIAGNOSIS — N2581 Secondary hyperparathyroidism of renal origin: Secondary | ICD-10-CM | POA: Diagnosis not present

## 2016-10-16 DIAGNOSIS — N186 End stage renal disease: Secondary | ICD-10-CM | POA: Diagnosis not present

## 2016-10-16 DIAGNOSIS — Z992 Dependence on renal dialysis: Secondary | ICD-10-CM | POA: Diagnosis not present

## 2016-10-16 DIAGNOSIS — N2581 Secondary hyperparathyroidism of renal origin: Secondary | ICD-10-CM | POA: Diagnosis not present

## 2016-10-16 DIAGNOSIS — D631 Anemia in chronic kidney disease: Secondary | ICD-10-CM | POA: Diagnosis not present

## 2016-10-16 DIAGNOSIS — D509 Iron deficiency anemia, unspecified: Secondary | ICD-10-CM | POA: Diagnosis not present

## 2016-10-18 DIAGNOSIS — N2581 Secondary hyperparathyroidism of renal origin: Secondary | ICD-10-CM | POA: Diagnosis not present

## 2016-10-18 DIAGNOSIS — Z992 Dependence on renal dialysis: Secondary | ICD-10-CM | POA: Diagnosis not present

## 2016-10-18 DIAGNOSIS — N186 End stage renal disease: Secondary | ICD-10-CM | POA: Diagnosis not present

## 2016-10-18 DIAGNOSIS — D631 Anemia in chronic kidney disease: Secondary | ICD-10-CM | POA: Diagnosis not present

## 2016-10-18 DIAGNOSIS — D509 Iron deficiency anemia, unspecified: Secondary | ICD-10-CM | POA: Diagnosis not present

## 2016-10-20 DIAGNOSIS — D509 Iron deficiency anemia, unspecified: Secondary | ICD-10-CM | POA: Diagnosis not present

## 2016-10-20 DIAGNOSIS — D631 Anemia in chronic kidney disease: Secondary | ICD-10-CM | POA: Diagnosis not present

## 2016-10-20 DIAGNOSIS — Z992 Dependence on renal dialysis: Secondary | ICD-10-CM | POA: Diagnosis not present

## 2016-10-20 DIAGNOSIS — N186 End stage renal disease: Secondary | ICD-10-CM | POA: Diagnosis not present

## 2016-10-20 DIAGNOSIS — N2581 Secondary hyperparathyroidism of renal origin: Secondary | ICD-10-CM | POA: Diagnosis not present

## 2016-10-23 DIAGNOSIS — N2581 Secondary hyperparathyroidism of renal origin: Secondary | ICD-10-CM | POA: Diagnosis not present

## 2016-10-23 DIAGNOSIS — D509 Iron deficiency anemia, unspecified: Secondary | ICD-10-CM | POA: Diagnosis not present

## 2016-10-23 DIAGNOSIS — Z992 Dependence on renal dialysis: Secondary | ICD-10-CM | POA: Diagnosis not present

## 2016-10-23 DIAGNOSIS — N186 End stage renal disease: Secondary | ICD-10-CM | POA: Diagnosis not present

## 2016-10-23 DIAGNOSIS — D631 Anemia in chronic kidney disease: Secondary | ICD-10-CM | POA: Diagnosis not present

## 2016-10-25 DIAGNOSIS — D631 Anemia in chronic kidney disease: Secondary | ICD-10-CM | POA: Diagnosis not present

## 2016-10-25 DIAGNOSIS — D509 Iron deficiency anemia, unspecified: Secondary | ICD-10-CM | POA: Diagnosis not present

## 2016-10-25 DIAGNOSIS — N2581 Secondary hyperparathyroidism of renal origin: Secondary | ICD-10-CM | POA: Diagnosis not present

## 2016-10-25 DIAGNOSIS — N186 End stage renal disease: Secondary | ICD-10-CM | POA: Diagnosis not present

## 2016-10-25 DIAGNOSIS — Z992 Dependence on renal dialysis: Secondary | ICD-10-CM | POA: Diagnosis not present

## 2016-10-26 DIAGNOSIS — Z0000 Encounter for general adult medical examination without abnormal findings: Secondary | ICD-10-CM | POA: Diagnosis not present

## 2016-10-26 DIAGNOSIS — Z713 Dietary counseling and surveillance: Secondary | ICD-10-CM | POA: Diagnosis not present

## 2016-10-26 DIAGNOSIS — Z1389 Encounter for screening for other disorder: Secondary | ICD-10-CM | POA: Diagnosis not present

## 2016-10-26 DIAGNOSIS — R5383 Other fatigue: Secondary | ICD-10-CM | POA: Diagnosis not present

## 2016-10-26 DIAGNOSIS — D649 Anemia, unspecified: Secondary | ICD-10-CM | POA: Diagnosis not present

## 2016-10-26 DIAGNOSIS — E7800 Pure hypercholesterolemia, unspecified: Secondary | ICD-10-CM | POA: Diagnosis not present

## 2016-10-26 DIAGNOSIS — Z79899 Other long term (current) drug therapy: Secondary | ICD-10-CM | POA: Diagnosis not present

## 2016-10-26 DIAGNOSIS — Z01419 Encounter for gynecological examination (general) (routine) without abnormal findings: Secondary | ICD-10-CM | POA: Diagnosis not present

## 2016-10-26 DIAGNOSIS — Z1211 Encounter for screening for malignant neoplasm of colon: Secondary | ICD-10-CM | POA: Diagnosis not present

## 2016-10-26 DIAGNOSIS — Z299 Encounter for prophylactic measures, unspecified: Secondary | ICD-10-CM | POA: Diagnosis not present

## 2016-10-26 DIAGNOSIS — Z7189 Other specified counseling: Secondary | ICD-10-CM | POA: Diagnosis not present

## 2016-10-27 DIAGNOSIS — D509 Iron deficiency anemia, unspecified: Secondary | ICD-10-CM | POA: Diagnosis not present

## 2016-10-27 DIAGNOSIS — Z992 Dependence on renal dialysis: Secondary | ICD-10-CM | POA: Diagnosis not present

## 2016-10-27 DIAGNOSIS — D631 Anemia in chronic kidney disease: Secondary | ICD-10-CM | POA: Diagnosis not present

## 2016-10-27 DIAGNOSIS — N186 End stage renal disease: Secondary | ICD-10-CM | POA: Diagnosis not present

## 2016-10-27 DIAGNOSIS — N2581 Secondary hyperparathyroidism of renal origin: Secondary | ICD-10-CM | POA: Diagnosis not present

## 2016-10-30 DIAGNOSIS — N2581 Secondary hyperparathyroidism of renal origin: Secondary | ICD-10-CM | POA: Diagnosis not present

## 2016-10-30 DIAGNOSIS — Z992 Dependence on renal dialysis: Secondary | ICD-10-CM | POA: Diagnosis not present

## 2016-10-30 DIAGNOSIS — D509 Iron deficiency anemia, unspecified: Secondary | ICD-10-CM | POA: Diagnosis not present

## 2016-10-30 DIAGNOSIS — D631 Anemia in chronic kidney disease: Secondary | ICD-10-CM | POA: Diagnosis not present

## 2016-10-30 DIAGNOSIS — N186 End stage renal disease: Secondary | ICD-10-CM | POA: Diagnosis not present

## 2016-11-01 DIAGNOSIS — Z992 Dependence on renal dialysis: Secondary | ICD-10-CM | POA: Diagnosis not present

## 2016-11-01 DIAGNOSIS — D631 Anemia in chronic kidney disease: Secondary | ICD-10-CM | POA: Diagnosis not present

## 2016-11-01 DIAGNOSIS — D509 Iron deficiency anemia, unspecified: Secondary | ICD-10-CM | POA: Diagnosis not present

## 2016-11-01 DIAGNOSIS — N2581 Secondary hyperparathyroidism of renal origin: Secondary | ICD-10-CM | POA: Diagnosis not present

## 2016-11-01 DIAGNOSIS — N186 End stage renal disease: Secondary | ICD-10-CM | POA: Diagnosis not present

## 2016-11-03 DIAGNOSIS — D509 Iron deficiency anemia, unspecified: Secondary | ICD-10-CM | POA: Diagnosis not present

## 2016-11-03 DIAGNOSIS — N186 End stage renal disease: Secondary | ICD-10-CM | POA: Diagnosis not present

## 2016-11-03 DIAGNOSIS — D631 Anemia in chronic kidney disease: Secondary | ICD-10-CM | POA: Diagnosis not present

## 2016-11-03 DIAGNOSIS — N2581 Secondary hyperparathyroidism of renal origin: Secondary | ICD-10-CM | POA: Diagnosis not present

## 2016-11-03 DIAGNOSIS — Z992 Dependence on renal dialysis: Secondary | ICD-10-CM | POA: Diagnosis not present

## 2016-11-06 DIAGNOSIS — Z992 Dependence on renal dialysis: Secondary | ICD-10-CM | POA: Diagnosis not present

## 2016-11-06 DIAGNOSIS — D509 Iron deficiency anemia, unspecified: Secondary | ICD-10-CM | POA: Diagnosis not present

## 2016-11-06 DIAGNOSIS — N2581 Secondary hyperparathyroidism of renal origin: Secondary | ICD-10-CM | POA: Diagnosis not present

## 2016-11-06 DIAGNOSIS — D631 Anemia in chronic kidney disease: Secondary | ICD-10-CM | POA: Diagnosis not present

## 2016-11-06 DIAGNOSIS — N186 End stage renal disease: Secondary | ICD-10-CM | POA: Diagnosis not present

## 2016-11-08 DIAGNOSIS — Z992 Dependence on renal dialysis: Secondary | ICD-10-CM | POA: Diagnosis not present

## 2016-11-08 DIAGNOSIS — N2581 Secondary hyperparathyroidism of renal origin: Secondary | ICD-10-CM | POA: Diagnosis not present

## 2016-11-08 DIAGNOSIS — D509 Iron deficiency anemia, unspecified: Secondary | ICD-10-CM | POA: Diagnosis not present

## 2016-11-08 DIAGNOSIS — N186 End stage renal disease: Secondary | ICD-10-CM | POA: Diagnosis not present

## 2016-11-08 DIAGNOSIS — D631 Anemia in chronic kidney disease: Secondary | ICD-10-CM | POA: Diagnosis not present

## 2016-11-10 DIAGNOSIS — D509 Iron deficiency anemia, unspecified: Secondary | ICD-10-CM | POA: Diagnosis not present

## 2016-11-10 DIAGNOSIS — D631 Anemia in chronic kidney disease: Secondary | ICD-10-CM | POA: Diagnosis not present

## 2016-11-10 DIAGNOSIS — Z992 Dependence on renal dialysis: Secondary | ICD-10-CM | POA: Diagnosis not present

## 2016-11-10 DIAGNOSIS — N2581 Secondary hyperparathyroidism of renal origin: Secondary | ICD-10-CM | POA: Diagnosis not present

## 2016-11-10 DIAGNOSIS — N186 End stage renal disease: Secondary | ICD-10-CM | POA: Diagnosis not present

## 2016-11-13 DIAGNOSIS — D631 Anemia in chronic kidney disease: Secondary | ICD-10-CM | POA: Diagnosis not present

## 2016-11-13 DIAGNOSIS — D509 Iron deficiency anemia, unspecified: Secondary | ICD-10-CM | POA: Diagnosis not present

## 2016-11-13 DIAGNOSIS — Z992 Dependence on renal dialysis: Secondary | ICD-10-CM | POA: Diagnosis not present

## 2016-11-13 DIAGNOSIS — N2581 Secondary hyperparathyroidism of renal origin: Secondary | ICD-10-CM | POA: Diagnosis not present

## 2016-11-13 DIAGNOSIS — N186 End stage renal disease: Secondary | ICD-10-CM | POA: Diagnosis not present

## 2016-11-15 DIAGNOSIS — N186 End stage renal disease: Secondary | ICD-10-CM | POA: Diagnosis not present

## 2016-11-15 DIAGNOSIS — D631 Anemia in chronic kidney disease: Secondary | ICD-10-CM | POA: Diagnosis not present

## 2016-11-15 DIAGNOSIS — D509 Iron deficiency anemia, unspecified: Secondary | ICD-10-CM | POA: Diagnosis not present

## 2016-11-15 DIAGNOSIS — N2581 Secondary hyperparathyroidism of renal origin: Secondary | ICD-10-CM | POA: Diagnosis not present

## 2016-11-15 DIAGNOSIS — Z992 Dependence on renal dialysis: Secondary | ICD-10-CM | POA: Diagnosis not present

## 2016-11-17 DIAGNOSIS — Z992 Dependence on renal dialysis: Secondary | ICD-10-CM | POA: Diagnosis not present

## 2016-11-17 DIAGNOSIS — N186 End stage renal disease: Secondary | ICD-10-CM | POA: Diagnosis not present

## 2016-11-17 DIAGNOSIS — D509 Iron deficiency anemia, unspecified: Secondary | ICD-10-CM | POA: Diagnosis not present

## 2016-11-17 DIAGNOSIS — D631 Anemia in chronic kidney disease: Secondary | ICD-10-CM | POA: Diagnosis not present

## 2016-11-17 DIAGNOSIS — N2581 Secondary hyperparathyroidism of renal origin: Secondary | ICD-10-CM | POA: Diagnosis not present

## 2016-11-18 DIAGNOSIS — N186 End stage renal disease: Secondary | ICD-10-CM | POA: Diagnosis not present

## 2016-11-18 DIAGNOSIS — Z992 Dependence on renal dialysis: Secondary | ICD-10-CM | POA: Diagnosis not present

## 2016-11-20 DIAGNOSIS — N2581 Secondary hyperparathyroidism of renal origin: Secondary | ICD-10-CM | POA: Diagnosis not present

## 2016-11-20 DIAGNOSIS — N186 End stage renal disease: Secondary | ICD-10-CM | POA: Diagnosis not present

## 2016-11-20 DIAGNOSIS — D631 Anemia in chronic kidney disease: Secondary | ICD-10-CM | POA: Diagnosis not present

## 2016-11-20 DIAGNOSIS — Z992 Dependence on renal dialysis: Secondary | ICD-10-CM | POA: Diagnosis not present

## 2016-11-20 DIAGNOSIS — D509 Iron deficiency anemia, unspecified: Secondary | ICD-10-CM | POA: Diagnosis not present

## 2016-11-21 DIAGNOSIS — N6324 Unspecified lump in the left breast, lower inner quadrant: Secondary | ICD-10-CM | POA: Diagnosis not present

## 2016-11-21 DIAGNOSIS — R928 Other abnormal and inconclusive findings on diagnostic imaging of breast: Secondary | ICD-10-CM | POA: Diagnosis not present

## 2016-11-21 DIAGNOSIS — Z992 Dependence on renal dialysis: Secondary | ICD-10-CM | POA: Diagnosis not present

## 2016-11-22 DIAGNOSIS — N2581 Secondary hyperparathyroidism of renal origin: Secondary | ICD-10-CM | POA: Diagnosis not present

## 2016-11-22 DIAGNOSIS — Z992 Dependence on renal dialysis: Secondary | ICD-10-CM | POA: Diagnosis not present

## 2016-11-22 DIAGNOSIS — D631 Anemia in chronic kidney disease: Secondary | ICD-10-CM | POA: Diagnosis not present

## 2016-11-22 DIAGNOSIS — D509 Iron deficiency anemia, unspecified: Secondary | ICD-10-CM | POA: Diagnosis not present

## 2016-11-22 DIAGNOSIS — N186 End stage renal disease: Secondary | ICD-10-CM | POA: Diagnosis not present

## 2016-11-23 DIAGNOSIS — E7800 Pure hypercholesterolemia, unspecified: Secondary | ICD-10-CM | POA: Diagnosis not present

## 2016-11-23 DIAGNOSIS — I10 Essential (primary) hypertension: Secondary | ICD-10-CM | POA: Diagnosis not present

## 2016-11-24 DIAGNOSIS — D631 Anemia in chronic kidney disease: Secondary | ICD-10-CM | POA: Diagnosis not present

## 2016-11-24 DIAGNOSIS — Z992 Dependence on renal dialysis: Secondary | ICD-10-CM | POA: Diagnosis not present

## 2016-11-24 DIAGNOSIS — D509 Iron deficiency anemia, unspecified: Secondary | ICD-10-CM | POA: Diagnosis not present

## 2016-11-24 DIAGNOSIS — N2581 Secondary hyperparathyroidism of renal origin: Secondary | ICD-10-CM | POA: Diagnosis not present

## 2016-11-24 DIAGNOSIS — N186 End stage renal disease: Secondary | ICD-10-CM | POA: Diagnosis not present

## 2016-11-27 DIAGNOSIS — N186 End stage renal disease: Secondary | ICD-10-CM | POA: Diagnosis not present

## 2016-11-27 DIAGNOSIS — N2581 Secondary hyperparathyroidism of renal origin: Secondary | ICD-10-CM | POA: Diagnosis not present

## 2016-11-27 DIAGNOSIS — D631 Anemia in chronic kidney disease: Secondary | ICD-10-CM | POA: Diagnosis not present

## 2016-11-27 DIAGNOSIS — D509 Iron deficiency anemia, unspecified: Secondary | ICD-10-CM | POA: Diagnosis not present

## 2016-11-27 DIAGNOSIS — Z992 Dependence on renal dialysis: Secondary | ICD-10-CM | POA: Diagnosis not present

## 2016-11-29 DIAGNOSIS — D631 Anemia in chronic kidney disease: Secondary | ICD-10-CM | POA: Diagnosis not present

## 2016-11-29 DIAGNOSIS — N186 End stage renal disease: Secondary | ICD-10-CM | POA: Diagnosis not present

## 2016-11-29 DIAGNOSIS — N2581 Secondary hyperparathyroidism of renal origin: Secondary | ICD-10-CM | POA: Diagnosis not present

## 2016-11-29 DIAGNOSIS — D509 Iron deficiency anemia, unspecified: Secondary | ICD-10-CM | POA: Diagnosis not present

## 2016-11-29 DIAGNOSIS — Z992 Dependence on renal dialysis: Secondary | ICD-10-CM | POA: Diagnosis not present

## 2016-12-01 DIAGNOSIS — Z992 Dependence on renal dialysis: Secondary | ICD-10-CM | POA: Diagnosis not present

## 2016-12-01 DIAGNOSIS — D631 Anemia in chronic kidney disease: Secondary | ICD-10-CM | POA: Diagnosis not present

## 2016-12-01 DIAGNOSIS — N2581 Secondary hyperparathyroidism of renal origin: Secondary | ICD-10-CM | POA: Diagnosis not present

## 2016-12-01 DIAGNOSIS — D509 Iron deficiency anemia, unspecified: Secondary | ICD-10-CM | POA: Diagnosis not present

## 2016-12-01 DIAGNOSIS — N186 End stage renal disease: Secondary | ICD-10-CM | POA: Diagnosis not present

## 2016-12-04 DIAGNOSIS — D509 Iron deficiency anemia, unspecified: Secondary | ICD-10-CM | POA: Diagnosis not present

## 2016-12-04 DIAGNOSIS — N2581 Secondary hyperparathyroidism of renal origin: Secondary | ICD-10-CM | POA: Diagnosis not present

## 2016-12-04 DIAGNOSIS — Z992 Dependence on renal dialysis: Secondary | ICD-10-CM | POA: Diagnosis not present

## 2016-12-04 DIAGNOSIS — N186 End stage renal disease: Secondary | ICD-10-CM | POA: Diagnosis not present

## 2016-12-04 DIAGNOSIS — D631 Anemia in chronic kidney disease: Secondary | ICD-10-CM | POA: Diagnosis not present

## 2016-12-07 DIAGNOSIS — D509 Iron deficiency anemia, unspecified: Secondary | ICD-10-CM | POA: Diagnosis not present

## 2016-12-07 DIAGNOSIS — D631 Anemia in chronic kidney disease: Secondary | ICD-10-CM | POA: Diagnosis not present

## 2016-12-07 DIAGNOSIS — Z992 Dependence on renal dialysis: Secondary | ICD-10-CM | POA: Diagnosis not present

## 2016-12-07 DIAGNOSIS — N2581 Secondary hyperparathyroidism of renal origin: Secondary | ICD-10-CM | POA: Diagnosis not present

## 2016-12-07 DIAGNOSIS — N186 End stage renal disease: Secondary | ICD-10-CM | POA: Diagnosis not present

## 2016-12-08 DIAGNOSIS — Z992 Dependence on renal dialysis: Secondary | ICD-10-CM | POA: Diagnosis not present

## 2016-12-08 DIAGNOSIS — N2581 Secondary hyperparathyroidism of renal origin: Secondary | ICD-10-CM | POA: Diagnosis not present

## 2016-12-08 DIAGNOSIS — N186 End stage renal disease: Secondary | ICD-10-CM | POA: Diagnosis not present

## 2016-12-08 DIAGNOSIS — D509 Iron deficiency anemia, unspecified: Secondary | ICD-10-CM | POA: Diagnosis not present

## 2016-12-08 DIAGNOSIS — D631 Anemia in chronic kidney disease: Secondary | ICD-10-CM | POA: Diagnosis not present

## 2016-12-11 DIAGNOSIS — D509 Iron deficiency anemia, unspecified: Secondary | ICD-10-CM | POA: Diagnosis not present

## 2016-12-11 DIAGNOSIS — N2581 Secondary hyperparathyroidism of renal origin: Secondary | ICD-10-CM | POA: Diagnosis not present

## 2016-12-11 DIAGNOSIS — N186 End stage renal disease: Secondary | ICD-10-CM | POA: Diagnosis not present

## 2016-12-11 DIAGNOSIS — Z992 Dependence on renal dialysis: Secondary | ICD-10-CM | POA: Diagnosis not present

## 2016-12-11 DIAGNOSIS — D631 Anemia in chronic kidney disease: Secondary | ICD-10-CM | POA: Diagnosis not present

## 2016-12-13 DIAGNOSIS — N186 End stage renal disease: Secondary | ICD-10-CM | POA: Diagnosis not present

## 2016-12-13 DIAGNOSIS — Z992 Dependence on renal dialysis: Secondary | ICD-10-CM | POA: Diagnosis not present

## 2016-12-13 DIAGNOSIS — D509 Iron deficiency anemia, unspecified: Secondary | ICD-10-CM | POA: Diagnosis not present

## 2016-12-13 DIAGNOSIS — D631 Anemia in chronic kidney disease: Secondary | ICD-10-CM | POA: Diagnosis not present

## 2016-12-13 DIAGNOSIS — N2581 Secondary hyperparathyroidism of renal origin: Secondary | ICD-10-CM | POA: Diagnosis not present

## 2016-12-15 DIAGNOSIS — D509 Iron deficiency anemia, unspecified: Secondary | ICD-10-CM | POA: Diagnosis not present

## 2016-12-15 DIAGNOSIS — N186 End stage renal disease: Secondary | ICD-10-CM | POA: Diagnosis not present

## 2016-12-15 DIAGNOSIS — D631 Anemia in chronic kidney disease: Secondary | ICD-10-CM | POA: Diagnosis not present

## 2016-12-15 DIAGNOSIS — Z992 Dependence on renal dialysis: Secondary | ICD-10-CM | POA: Diagnosis not present

## 2016-12-15 DIAGNOSIS — N2581 Secondary hyperparathyroidism of renal origin: Secondary | ICD-10-CM | POA: Diagnosis not present

## 2016-12-18 DIAGNOSIS — D631 Anemia in chronic kidney disease: Secondary | ICD-10-CM | POA: Diagnosis not present

## 2016-12-18 DIAGNOSIS — N2581 Secondary hyperparathyroidism of renal origin: Secondary | ICD-10-CM | POA: Diagnosis not present

## 2016-12-18 DIAGNOSIS — N186 End stage renal disease: Secondary | ICD-10-CM | POA: Diagnosis not present

## 2016-12-18 DIAGNOSIS — D509 Iron deficiency anemia, unspecified: Secondary | ICD-10-CM | POA: Diagnosis not present

## 2016-12-18 DIAGNOSIS — Z992 Dependence on renal dialysis: Secondary | ICD-10-CM | POA: Diagnosis not present

## 2016-12-19 DIAGNOSIS — N186 End stage renal disease: Secondary | ICD-10-CM | POA: Diagnosis not present

## 2016-12-19 DIAGNOSIS — Z992 Dependence on renal dialysis: Secondary | ICD-10-CM | POA: Diagnosis not present

## 2016-12-20 DIAGNOSIS — D631 Anemia in chronic kidney disease: Secondary | ICD-10-CM | POA: Diagnosis not present

## 2016-12-20 DIAGNOSIS — Z992 Dependence on renal dialysis: Secondary | ICD-10-CM | POA: Diagnosis not present

## 2016-12-20 DIAGNOSIS — D509 Iron deficiency anemia, unspecified: Secondary | ICD-10-CM | POA: Diagnosis not present

## 2016-12-20 DIAGNOSIS — N2581 Secondary hyperparathyroidism of renal origin: Secondary | ICD-10-CM | POA: Diagnosis not present

## 2016-12-20 DIAGNOSIS — N186 End stage renal disease: Secondary | ICD-10-CM | POA: Diagnosis not present

## 2016-12-22 DIAGNOSIS — D631 Anemia in chronic kidney disease: Secondary | ICD-10-CM | POA: Diagnosis not present

## 2016-12-22 DIAGNOSIS — N186 End stage renal disease: Secondary | ICD-10-CM | POA: Diagnosis not present

## 2016-12-22 DIAGNOSIS — Z992 Dependence on renal dialysis: Secondary | ICD-10-CM | POA: Diagnosis not present

## 2016-12-22 DIAGNOSIS — D509 Iron deficiency anemia, unspecified: Secondary | ICD-10-CM | POA: Diagnosis not present

## 2016-12-22 DIAGNOSIS — N2581 Secondary hyperparathyroidism of renal origin: Secondary | ICD-10-CM | POA: Diagnosis not present

## 2016-12-25 DIAGNOSIS — D509 Iron deficiency anemia, unspecified: Secondary | ICD-10-CM | POA: Diagnosis not present

## 2016-12-25 DIAGNOSIS — Z992 Dependence on renal dialysis: Secondary | ICD-10-CM | POA: Diagnosis not present

## 2016-12-25 DIAGNOSIS — D631 Anemia in chronic kidney disease: Secondary | ICD-10-CM | POA: Diagnosis not present

## 2016-12-25 DIAGNOSIS — N2581 Secondary hyperparathyroidism of renal origin: Secondary | ICD-10-CM | POA: Diagnosis not present

## 2016-12-25 DIAGNOSIS — N186 End stage renal disease: Secondary | ICD-10-CM | POA: Diagnosis not present

## 2016-12-26 DIAGNOSIS — I270 Primary pulmonary hypertension: Secondary | ICD-10-CM | POA: Diagnosis not present

## 2016-12-26 DIAGNOSIS — Z6823 Body mass index (BMI) 23.0-23.9, adult: Secondary | ICD-10-CM | POA: Diagnosis not present

## 2016-12-26 DIAGNOSIS — I429 Cardiomyopathy, unspecified: Secondary | ICD-10-CM | POA: Diagnosis not present

## 2016-12-26 DIAGNOSIS — Z789 Other specified health status: Secondary | ICD-10-CM | POA: Diagnosis not present

## 2016-12-26 DIAGNOSIS — J069 Acute upper respiratory infection, unspecified: Secondary | ICD-10-CM | POA: Diagnosis not present

## 2016-12-26 DIAGNOSIS — Z992 Dependence on renal dialysis: Secondary | ICD-10-CM | POA: Diagnosis not present

## 2016-12-26 DIAGNOSIS — Z713 Dietary counseling and surveillance: Secondary | ICD-10-CM | POA: Diagnosis not present

## 2016-12-26 DIAGNOSIS — Z299 Encounter for prophylactic measures, unspecified: Secondary | ICD-10-CM | POA: Diagnosis not present

## 2016-12-27 DIAGNOSIS — N186 End stage renal disease: Secondary | ICD-10-CM | POA: Diagnosis not present

## 2016-12-27 DIAGNOSIS — N2581 Secondary hyperparathyroidism of renal origin: Secondary | ICD-10-CM | POA: Diagnosis not present

## 2016-12-27 DIAGNOSIS — D631 Anemia in chronic kidney disease: Secondary | ICD-10-CM | POA: Diagnosis not present

## 2016-12-27 DIAGNOSIS — D509 Iron deficiency anemia, unspecified: Secondary | ICD-10-CM | POA: Diagnosis not present

## 2016-12-27 DIAGNOSIS — Z992 Dependence on renal dialysis: Secondary | ICD-10-CM | POA: Diagnosis not present

## 2016-12-29 DIAGNOSIS — D631 Anemia in chronic kidney disease: Secondary | ICD-10-CM | POA: Diagnosis not present

## 2016-12-29 DIAGNOSIS — D509 Iron deficiency anemia, unspecified: Secondary | ICD-10-CM | POA: Diagnosis not present

## 2016-12-29 DIAGNOSIS — N186 End stage renal disease: Secondary | ICD-10-CM | POA: Diagnosis not present

## 2016-12-29 DIAGNOSIS — N2581 Secondary hyperparathyroidism of renal origin: Secondary | ICD-10-CM | POA: Diagnosis not present

## 2016-12-29 DIAGNOSIS — Z992 Dependence on renal dialysis: Secondary | ICD-10-CM | POA: Diagnosis not present

## 2017-01-01 DIAGNOSIS — D631 Anemia in chronic kidney disease: Secondary | ICD-10-CM | POA: Diagnosis not present

## 2017-01-01 DIAGNOSIS — N2581 Secondary hyperparathyroidism of renal origin: Secondary | ICD-10-CM | POA: Diagnosis not present

## 2017-01-01 DIAGNOSIS — Z992 Dependence on renal dialysis: Secondary | ICD-10-CM | POA: Diagnosis not present

## 2017-01-01 DIAGNOSIS — N186 End stage renal disease: Secondary | ICD-10-CM | POA: Diagnosis not present

## 2017-01-01 DIAGNOSIS — D509 Iron deficiency anemia, unspecified: Secondary | ICD-10-CM | POA: Diagnosis not present

## 2017-01-03 DIAGNOSIS — D509 Iron deficiency anemia, unspecified: Secondary | ICD-10-CM | POA: Diagnosis not present

## 2017-01-03 DIAGNOSIS — N2581 Secondary hyperparathyroidism of renal origin: Secondary | ICD-10-CM | POA: Diagnosis not present

## 2017-01-03 DIAGNOSIS — Z992 Dependence on renal dialysis: Secondary | ICD-10-CM | POA: Diagnosis not present

## 2017-01-03 DIAGNOSIS — N186 End stage renal disease: Secondary | ICD-10-CM | POA: Diagnosis not present

## 2017-01-03 DIAGNOSIS — D631 Anemia in chronic kidney disease: Secondary | ICD-10-CM | POA: Diagnosis not present

## 2017-01-05 DIAGNOSIS — Z992 Dependence on renal dialysis: Secondary | ICD-10-CM | POA: Diagnosis not present

## 2017-01-05 DIAGNOSIS — N2581 Secondary hyperparathyroidism of renal origin: Secondary | ICD-10-CM | POA: Diagnosis not present

## 2017-01-05 DIAGNOSIS — D631 Anemia in chronic kidney disease: Secondary | ICD-10-CM | POA: Diagnosis not present

## 2017-01-05 DIAGNOSIS — D509 Iron deficiency anemia, unspecified: Secondary | ICD-10-CM | POA: Diagnosis not present

## 2017-01-05 DIAGNOSIS — N186 End stage renal disease: Secondary | ICD-10-CM | POA: Diagnosis not present

## 2017-01-07 DIAGNOSIS — N2581 Secondary hyperparathyroidism of renal origin: Secondary | ICD-10-CM | POA: Diagnosis not present

## 2017-01-07 DIAGNOSIS — N186 End stage renal disease: Secondary | ICD-10-CM | POA: Diagnosis not present

## 2017-01-07 DIAGNOSIS — D631 Anemia in chronic kidney disease: Secondary | ICD-10-CM | POA: Diagnosis not present

## 2017-01-07 DIAGNOSIS — D509 Iron deficiency anemia, unspecified: Secondary | ICD-10-CM | POA: Diagnosis not present

## 2017-01-07 DIAGNOSIS — Z992 Dependence on renal dialysis: Secondary | ICD-10-CM | POA: Diagnosis not present

## 2017-01-08 DIAGNOSIS — D509 Iron deficiency anemia, unspecified: Secondary | ICD-10-CM | POA: Diagnosis not present

## 2017-01-08 DIAGNOSIS — N186 End stage renal disease: Secondary | ICD-10-CM | POA: Diagnosis not present

## 2017-01-08 DIAGNOSIS — N2581 Secondary hyperparathyroidism of renal origin: Secondary | ICD-10-CM | POA: Diagnosis not present

## 2017-01-08 DIAGNOSIS — Z992 Dependence on renal dialysis: Secondary | ICD-10-CM | POA: Diagnosis not present

## 2017-01-08 DIAGNOSIS — D631 Anemia in chronic kidney disease: Secondary | ICD-10-CM | POA: Diagnosis not present

## 2017-01-10 DIAGNOSIS — D509 Iron deficiency anemia, unspecified: Secondary | ICD-10-CM | POA: Diagnosis not present

## 2017-01-10 DIAGNOSIS — D631 Anemia in chronic kidney disease: Secondary | ICD-10-CM | POA: Diagnosis not present

## 2017-01-10 DIAGNOSIS — Z992 Dependence on renal dialysis: Secondary | ICD-10-CM | POA: Diagnosis not present

## 2017-01-10 DIAGNOSIS — N2581 Secondary hyperparathyroidism of renal origin: Secondary | ICD-10-CM | POA: Diagnosis not present

## 2017-01-10 DIAGNOSIS — N186 End stage renal disease: Secondary | ICD-10-CM | POA: Diagnosis not present

## 2017-01-12 DIAGNOSIS — N186 End stage renal disease: Secondary | ICD-10-CM | POA: Diagnosis not present

## 2017-01-12 DIAGNOSIS — Z992 Dependence on renal dialysis: Secondary | ICD-10-CM | POA: Diagnosis not present

## 2017-01-12 DIAGNOSIS — N2581 Secondary hyperparathyroidism of renal origin: Secondary | ICD-10-CM | POA: Diagnosis not present

## 2017-01-12 DIAGNOSIS — D631 Anemia in chronic kidney disease: Secondary | ICD-10-CM | POA: Diagnosis not present

## 2017-01-12 DIAGNOSIS — D509 Iron deficiency anemia, unspecified: Secondary | ICD-10-CM | POA: Diagnosis not present

## 2017-01-15 DIAGNOSIS — D509 Iron deficiency anemia, unspecified: Secondary | ICD-10-CM | POA: Diagnosis not present

## 2017-01-15 DIAGNOSIS — Z992 Dependence on renal dialysis: Secondary | ICD-10-CM | POA: Diagnosis not present

## 2017-01-15 DIAGNOSIS — N2581 Secondary hyperparathyroidism of renal origin: Secondary | ICD-10-CM | POA: Diagnosis not present

## 2017-01-15 DIAGNOSIS — D631 Anemia in chronic kidney disease: Secondary | ICD-10-CM | POA: Diagnosis not present

## 2017-01-15 DIAGNOSIS — N186 End stage renal disease: Secondary | ICD-10-CM | POA: Diagnosis not present

## 2017-01-16 DIAGNOSIS — Z992 Dependence on renal dialysis: Secondary | ICD-10-CM | POA: Diagnosis not present

## 2017-01-16 DIAGNOSIS — N186 End stage renal disease: Secondary | ICD-10-CM | POA: Diagnosis not present

## 2017-01-17 DIAGNOSIS — Z992 Dependence on renal dialysis: Secondary | ICD-10-CM | POA: Diagnosis not present

## 2017-01-17 DIAGNOSIS — D509 Iron deficiency anemia, unspecified: Secondary | ICD-10-CM | POA: Diagnosis not present

## 2017-01-17 DIAGNOSIS — D631 Anemia in chronic kidney disease: Secondary | ICD-10-CM | POA: Diagnosis not present

## 2017-01-17 DIAGNOSIS — N186 End stage renal disease: Secondary | ICD-10-CM | POA: Diagnosis not present

## 2017-01-17 DIAGNOSIS — N2581 Secondary hyperparathyroidism of renal origin: Secondary | ICD-10-CM | POA: Diagnosis not present

## 2017-01-19 DIAGNOSIS — N186 End stage renal disease: Secondary | ICD-10-CM | POA: Diagnosis not present

## 2017-01-19 DIAGNOSIS — N2581 Secondary hyperparathyroidism of renal origin: Secondary | ICD-10-CM | POA: Diagnosis not present

## 2017-01-19 DIAGNOSIS — Z992 Dependence on renal dialysis: Secondary | ICD-10-CM | POA: Diagnosis not present

## 2017-01-19 DIAGNOSIS — D631 Anemia in chronic kidney disease: Secondary | ICD-10-CM | POA: Diagnosis not present

## 2017-01-19 DIAGNOSIS — D509 Iron deficiency anemia, unspecified: Secondary | ICD-10-CM | POA: Diagnosis not present

## 2017-01-22 DIAGNOSIS — N186 End stage renal disease: Secondary | ICD-10-CM | POA: Diagnosis not present

## 2017-01-22 DIAGNOSIS — D509 Iron deficiency anemia, unspecified: Secondary | ICD-10-CM | POA: Diagnosis not present

## 2017-01-22 DIAGNOSIS — D631 Anemia in chronic kidney disease: Secondary | ICD-10-CM | POA: Diagnosis not present

## 2017-01-22 DIAGNOSIS — N2581 Secondary hyperparathyroidism of renal origin: Secondary | ICD-10-CM | POA: Diagnosis not present

## 2017-01-22 DIAGNOSIS — Z992 Dependence on renal dialysis: Secondary | ICD-10-CM | POA: Diagnosis not present

## 2017-01-24 DIAGNOSIS — D631 Anemia in chronic kidney disease: Secondary | ICD-10-CM | POA: Diagnosis not present

## 2017-01-24 DIAGNOSIS — N186 End stage renal disease: Secondary | ICD-10-CM | POA: Diagnosis not present

## 2017-01-24 DIAGNOSIS — Z992 Dependence on renal dialysis: Secondary | ICD-10-CM | POA: Diagnosis not present

## 2017-01-24 DIAGNOSIS — D509 Iron deficiency anemia, unspecified: Secondary | ICD-10-CM | POA: Diagnosis not present

## 2017-01-24 DIAGNOSIS — N2581 Secondary hyperparathyroidism of renal origin: Secondary | ICD-10-CM | POA: Diagnosis not present

## 2017-01-26 DIAGNOSIS — N186 End stage renal disease: Secondary | ICD-10-CM | POA: Diagnosis not present

## 2017-01-26 DIAGNOSIS — N2581 Secondary hyperparathyroidism of renal origin: Secondary | ICD-10-CM | POA: Diagnosis not present

## 2017-01-26 DIAGNOSIS — D631 Anemia in chronic kidney disease: Secondary | ICD-10-CM | POA: Diagnosis not present

## 2017-01-26 DIAGNOSIS — Z992 Dependence on renal dialysis: Secondary | ICD-10-CM | POA: Diagnosis not present

## 2017-01-26 DIAGNOSIS — D509 Iron deficiency anemia, unspecified: Secondary | ICD-10-CM | POA: Diagnosis not present

## 2017-01-29 DIAGNOSIS — Z992 Dependence on renal dialysis: Secondary | ICD-10-CM | POA: Diagnosis not present

## 2017-01-29 DIAGNOSIS — D631 Anemia in chronic kidney disease: Secondary | ICD-10-CM | POA: Diagnosis not present

## 2017-01-29 DIAGNOSIS — N186 End stage renal disease: Secondary | ICD-10-CM | POA: Diagnosis not present

## 2017-01-29 DIAGNOSIS — D509 Iron deficiency anemia, unspecified: Secondary | ICD-10-CM | POA: Diagnosis not present

## 2017-01-29 DIAGNOSIS — N2581 Secondary hyperparathyroidism of renal origin: Secondary | ICD-10-CM | POA: Diagnosis not present

## 2017-01-31 DIAGNOSIS — N186 End stage renal disease: Secondary | ICD-10-CM | POA: Diagnosis not present

## 2017-01-31 DIAGNOSIS — D631 Anemia in chronic kidney disease: Secondary | ICD-10-CM | POA: Diagnosis not present

## 2017-01-31 DIAGNOSIS — D509 Iron deficiency anemia, unspecified: Secondary | ICD-10-CM | POA: Diagnosis not present

## 2017-01-31 DIAGNOSIS — Z992 Dependence on renal dialysis: Secondary | ICD-10-CM | POA: Diagnosis not present

## 2017-01-31 DIAGNOSIS — N2581 Secondary hyperparathyroidism of renal origin: Secondary | ICD-10-CM | POA: Diagnosis not present

## 2017-02-01 DIAGNOSIS — I429 Cardiomyopathy, unspecified: Secondary | ICD-10-CM | POA: Diagnosis not present

## 2017-02-01 DIAGNOSIS — E7800 Pure hypercholesterolemia, unspecified: Secondary | ICD-10-CM | POA: Diagnosis not present

## 2017-02-01 DIAGNOSIS — Z299 Encounter for prophylactic measures, unspecified: Secondary | ICD-10-CM | POA: Diagnosis not present

## 2017-02-01 DIAGNOSIS — Z992 Dependence on renal dialysis: Secondary | ICD-10-CM | POA: Diagnosis not present

## 2017-02-01 DIAGNOSIS — Z713 Dietary counseling and surveillance: Secondary | ICD-10-CM | POA: Diagnosis not present

## 2017-02-01 DIAGNOSIS — M810 Age-related osteoporosis without current pathological fracture: Secondary | ICD-10-CM | POA: Diagnosis not present

## 2017-02-01 DIAGNOSIS — I270 Primary pulmonary hypertension: Secondary | ICD-10-CM | POA: Diagnosis not present

## 2017-02-01 DIAGNOSIS — I10 Essential (primary) hypertension: Secondary | ICD-10-CM | POA: Diagnosis not present

## 2017-02-01 DIAGNOSIS — N186 End stage renal disease: Secondary | ICD-10-CM | POA: Diagnosis not present

## 2017-02-02 DIAGNOSIS — D509 Iron deficiency anemia, unspecified: Secondary | ICD-10-CM | POA: Diagnosis not present

## 2017-02-02 DIAGNOSIS — N2581 Secondary hyperparathyroidism of renal origin: Secondary | ICD-10-CM | POA: Diagnosis not present

## 2017-02-02 DIAGNOSIS — N186 End stage renal disease: Secondary | ICD-10-CM | POA: Diagnosis not present

## 2017-02-02 DIAGNOSIS — D631 Anemia in chronic kidney disease: Secondary | ICD-10-CM | POA: Diagnosis not present

## 2017-02-02 DIAGNOSIS — Z992 Dependence on renal dialysis: Secondary | ICD-10-CM | POA: Diagnosis not present

## 2017-02-04 DIAGNOSIS — N2581 Secondary hyperparathyroidism of renal origin: Secondary | ICD-10-CM | POA: Diagnosis not present

## 2017-02-04 DIAGNOSIS — N186 End stage renal disease: Secondary | ICD-10-CM | POA: Diagnosis not present

## 2017-02-04 DIAGNOSIS — D631 Anemia in chronic kidney disease: Secondary | ICD-10-CM | POA: Diagnosis not present

## 2017-02-04 DIAGNOSIS — D509 Iron deficiency anemia, unspecified: Secondary | ICD-10-CM | POA: Diagnosis not present

## 2017-02-04 DIAGNOSIS — Z992 Dependence on renal dialysis: Secondary | ICD-10-CM | POA: Diagnosis not present

## 2017-02-05 DIAGNOSIS — Z992 Dependence on renal dialysis: Secondary | ICD-10-CM | POA: Diagnosis not present

## 2017-02-05 DIAGNOSIS — D631 Anemia in chronic kidney disease: Secondary | ICD-10-CM | POA: Diagnosis not present

## 2017-02-05 DIAGNOSIS — N2581 Secondary hyperparathyroidism of renal origin: Secondary | ICD-10-CM | POA: Diagnosis not present

## 2017-02-05 DIAGNOSIS — N186 End stage renal disease: Secondary | ICD-10-CM | POA: Diagnosis not present

## 2017-02-05 DIAGNOSIS — D509 Iron deficiency anemia, unspecified: Secondary | ICD-10-CM | POA: Diagnosis not present

## 2017-02-07 DIAGNOSIS — D631 Anemia in chronic kidney disease: Secondary | ICD-10-CM | POA: Diagnosis not present

## 2017-02-07 DIAGNOSIS — N2581 Secondary hyperparathyroidism of renal origin: Secondary | ICD-10-CM | POA: Diagnosis not present

## 2017-02-07 DIAGNOSIS — Z992 Dependence on renal dialysis: Secondary | ICD-10-CM | POA: Diagnosis not present

## 2017-02-07 DIAGNOSIS — D509 Iron deficiency anemia, unspecified: Secondary | ICD-10-CM | POA: Diagnosis not present

## 2017-02-07 DIAGNOSIS — N186 End stage renal disease: Secondary | ICD-10-CM | POA: Diagnosis not present

## 2017-02-09 DIAGNOSIS — N2581 Secondary hyperparathyroidism of renal origin: Secondary | ICD-10-CM | POA: Diagnosis not present

## 2017-02-09 DIAGNOSIS — Z992 Dependence on renal dialysis: Secondary | ICD-10-CM | POA: Diagnosis not present

## 2017-02-09 DIAGNOSIS — D509 Iron deficiency anemia, unspecified: Secondary | ICD-10-CM | POA: Diagnosis not present

## 2017-02-09 DIAGNOSIS — D631 Anemia in chronic kidney disease: Secondary | ICD-10-CM | POA: Diagnosis not present

## 2017-02-09 DIAGNOSIS — N186 End stage renal disease: Secondary | ICD-10-CM | POA: Diagnosis not present

## 2017-02-12 DIAGNOSIS — I10 Essential (primary) hypertension: Secondary | ICD-10-CM | POA: Diagnosis not present

## 2017-02-12 DIAGNOSIS — N186 End stage renal disease: Secondary | ICD-10-CM | POA: Diagnosis not present

## 2017-02-12 DIAGNOSIS — E7800 Pure hypercholesterolemia, unspecified: Secondary | ICD-10-CM | POA: Diagnosis not present

## 2017-02-12 DIAGNOSIS — D631 Anemia in chronic kidney disease: Secondary | ICD-10-CM | POA: Diagnosis not present

## 2017-02-12 DIAGNOSIS — Z992 Dependence on renal dialysis: Secondary | ICD-10-CM | POA: Diagnosis not present

## 2017-02-12 DIAGNOSIS — D509 Iron deficiency anemia, unspecified: Secondary | ICD-10-CM | POA: Diagnosis not present

## 2017-02-12 DIAGNOSIS — E785 Hyperlipidemia, unspecified: Secondary | ICD-10-CM | POA: Diagnosis not present

## 2017-02-12 DIAGNOSIS — N2581 Secondary hyperparathyroidism of renal origin: Secondary | ICD-10-CM | POA: Diagnosis not present

## 2017-02-14 DIAGNOSIS — Z992 Dependence on renal dialysis: Secondary | ICD-10-CM | POA: Diagnosis not present

## 2017-02-14 DIAGNOSIS — D631 Anemia in chronic kidney disease: Secondary | ICD-10-CM | POA: Diagnosis not present

## 2017-02-14 DIAGNOSIS — N186 End stage renal disease: Secondary | ICD-10-CM | POA: Diagnosis not present

## 2017-02-14 DIAGNOSIS — N2581 Secondary hyperparathyroidism of renal origin: Secondary | ICD-10-CM | POA: Diagnosis not present

## 2017-02-14 DIAGNOSIS — D509 Iron deficiency anemia, unspecified: Secondary | ICD-10-CM | POA: Diagnosis not present

## 2017-02-16 DIAGNOSIS — N2581 Secondary hyperparathyroidism of renal origin: Secondary | ICD-10-CM | POA: Diagnosis not present

## 2017-02-16 DIAGNOSIS — D631 Anemia in chronic kidney disease: Secondary | ICD-10-CM | POA: Diagnosis not present

## 2017-02-16 DIAGNOSIS — D509 Iron deficiency anemia, unspecified: Secondary | ICD-10-CM | POA: Diagnosis not present

## 2017-02-16 DIAGNOSIS — Z992 Dependence on renal dialysis: Secondary | ICD-10-CM | POA: Diagnosis not present

## 2017-02-16 DIAGNOSIS — N186 End stage renal disease: Secondary | ICD-10-CM | POA: Diagnosis not present

## 2017-02-17 DIAGNOSIS — N2581 Secondary hyperparathyroidism of renal origin: Secondary | ICD-10-CM | POA: Diagnosis not present

## 2017-02-17 DIAGNOSIS — Z992 Dependence on renal dialysis: Secondary | ICD-10-CM | POA: Diagnosis not present

## 2017-02-17 DIAGNOSIS — N186 End stage renal disease: Secondary | ICD-10-CM | POA: Diagnosis not present

## 2017-02-17 DIAGNOSIS — D631 Anemia in chronic kidney disease: Secondary | ICD-10-CM | POA: Diagnosis not present

## 2017-02-17 DIAGNOSIS — D509 Iron deficiency anemia, unspecified: Secondary | ICD-10-CM | POA: Diagnosis not present

## 2017-02-19 DIAGNOSIS — Z992 Dependence on renal dialysis: Secondary | ICD-10-CM | POA: Diagnosis not present

## 2017-02-19 DIAGNOSIS — D509 Iron deficiency anemia, unspecified: Secondary | ICD-10-CM | POA: Diagnosis not present

## 2017-02-19 DIAGNOSIS — N2581 Secondary hyperparathyroidism of renal origin: Secondary | ICD-10-CM | POA: Diagnosis not present

## 2017-02-19 DIAGNOSIS — N186 End stage renal disease: Secondary | ICD-10-CM | POA: Diagnosis not present

## 2017-02-19 DIAGNOSIS — D631 Anemia in chronic kidney disease: Secondary | ICD-10-CM | POA: Diagnosis not present

## 2017-02-21 DIAGNOSIS — D509 Iron deficiency anemia, unspecified: Secondary | ICD-10-CM | POA: Diagnosis not present

## 2017-02-21 DIAGNOSIS — D631 Anemia in chronic kidney disease: Secondary | ICD-10-CM | POA: Diagnosis not present

## 2017-02-21 DIAGNOSIS — N2581 Secondary hyperparathyroidism of renal origin: Secondary | ICD-10-CM | POA: Diagnosis not present

## 2017-02-21 DIAGNOSIS — N186 End stage renal disease: Secondary | ICD-10-CM | POA: Diagnosis not present

## 2017-02-21 DIAGNOSIS — Z992 Dependence on renal dialysis: Secondary | ICD-10-CM | POA: Diagnosis not present

## 2017-02-23 DIAGNOSIS — N186 End stage renal disease: Secondary | ICD-10-CM | POA: Diagnosis not present

## 2017-02-23 DIAGNOSIS — Z992 Dependence on renal dialysis: Secondary | ICD-10-CM | POA: Diagnosis not present

## 2017-02-23 DIAGNOSIS — D631 Anemia in chronic kidney disease: Secondary | ICD-10-CM | POA: Diagnosis not present

## 2017-02-23 DIAGNOSIS — D509 Iron deficiency anemia, unspecified: Secondary | ICD-10-CM | POA: Diagnosis not present

## 2017-02-23 DIAGNOSIS — N2581 Secondary hyperparathyroidism of renal origin: Secondary | ICD-10-CM | POA: Diagnosis not present

## 2017-02-26 DIAGNOSIS — Z992 Dependence on renal dialysis: Secondary | ICD-10-CM | POA: Diagnosis not present

## 2017-02-26 DIAGNOSIS — D509 Iron deficiency anemia, unspecified: Secondary | ICD-10-CM | POA: Diagnosis not present

## 2017-02-26 DIAGNOSIS — N186 End stage renal disease: Secondary | ICD-10-CM | POA: Diagnosis not present

## 2017-02-26 DIAGNOSIS — N2581 Secondary hyperparathyroidism of renal origin: Secondary | ICD-10-CM | POA: Diagnosis not present

## 2017-02-26 DIAGNOSIS — D631 Anemia in chronic kidney disease: Secondary | ICD-10-CM | POA: Diagnosis not present

## 2017-02-28 DIAGNOSIS — Z992 Dependence on renal dialysis: Secondary | ICD-10-CM | POA: Diagnosis not present

## 2017-02-28 DIAGNOSIS — N2581 Secondary hyperparathyroidism of renal origin: Secondary | ICD-10-CM | POA: Diagnosis not present

## 2017-02-28 DIAGNOSIS — D509 Iron deficiency anemia, unspecified: Secondary | ICD-10-CM | POA: Diagnosis not present

## 2017-02-28 DIAGNOSIS — N186 End stage renal disease: Secondary | ICD-10-CM | POA: Diagnosis not present

## 2017-02-28 DIAGNOSIS — D631 Anemia in chronic kidney disease: Secondary | ICD-10-CM | POA: Diagnosis not present

## 2017-03-02 DIAGNOSIS — D631 Anemia in chronic kidney disease: Secondary | ICD-10-CM | POA: Diagnosis not present

## 2017-03-02 DIAGNOSIS — Z992 Dependence on renal dialysis: Secondary | ICD-10-CM | POA: Diagnosis not present

## 2017-03-02 DIAGNOSIS — D509 Iron deficiency anemia, unspecified: Secondary | ICD-10-CM | POA: Diagnosis not present

## 2017-03-02 DIAGNOSIS — N186 End stage renal disease: Secondary | ICD-10-CM | POA: Diagnosis not present

## 2017-03-02 DIAGNOSIS — N2581 Secondary hyperparathyroidism of renal origin: Secondary | ICD-10-CM | POA: Diagnosis not present

## 2017-03-05 DIAGNOSIS — D631 Anemia in chronic kidney disease: Secondary | ICD-10-CM | POA: Diagnosis not present

## 2017-03-05 DIAGNOSIS — N2581 Secondary hyperparathyroidism of renal origin: Secondary | ICD-10-CM | POA: Diagnosis not present

## 2017-03-05 DIAGNOSIS — Z992 Dependence on renal dialysis: Secondary | ICD-10-CM | POA: Diagnosis not present

## 2017-03-05 DIAGNOSIS — D509 Iron deficiency anemia, unspecified: Secondary | ICD-10-CM | POA: Diagnosis not present

## 2017-03-05 DIAGNOSIS — N186 End stage renal disease: Secondary | ICD-10-CM | POA: Diagnosis not present

## 2017-03-06 DIAGNOSIS — N186 End stage renal disease: Secondary | ICD-10-CM | POA: Diagnosis not present

## 2017-03-06 DIAGNOSIS — D631 Anemia in chronic kidney disease: Secondary | ICD-10-CM | POA: Diagnosis not present

## 2017-03-06 DIAGNOSIS — Z992 Dependence on renal dialysis: Secondary | ICD-10-CM | POA: Diagnosis not present

## 2017-03-06 DIAGNOSIS — N2581 Secondary hyperparathyroidism of renal origin: Secondary | ICD-10-CM | POA: Diagnosis not present

## 2017-03-06 DIAGNOSIS — D509 Iron deficiency anemia, unspecified: Secondary | ICD-10-CM | POA: Diagnosis not present

## 2017-03-07 DIAGNOSIS — D509 Iron deficiency anemia, unspecified: Secondary | ICD-10-CM | POA: Diagnosis not present

## 2017-03-07 DIAGNOSIS — N2581 Secondary hyperparathyroidism of renal origin: Secondary | ICD-10-CM | POA: Diagnosis not present

## 2017-03-07 DIAGNOSIS — D631 Anemia in chronic kidney disease: Secondary | ICD-10-CM | POA: Diagnosis not present

## 2017-03-07 DIAGNOSIS — N186 End stage renal disease: Secondary | ICD-10-CM | POA: Diagnosis not present

## 2017-03-07 DIAGNOSIS — Z992 Dependence on renal dialysis: Secondary | ICD-10-CM | POA: Diagnosis not present

## 2017-03-08 DIAGNOSIS — D509 Iron deficiency anemia, unspecified: Secondary | ICD-10-CM | POA: Diagnosis not present

## 2017-03-08 DIAGNOSIS — N186 End stage renal disease: Secondary | ICD-10-CM | POA: Diagnosis not present

## 2017-03-08 DIAGNOSIS — D631 Anemia in chronic kidney disease: Secondary | ICD-10-CM | POA: Diagnosis not present

## 2017-03-08 DIAGNOSIS — Z992 Dependence on renal dialysis: Secondary | ICD-10-CM | POA: Diagnosis not present

## 2017-03-08 DIAGNOSIS — N2581 Secondary hyperparathyroidism of renal origin: Secondary | ICD-10-CM | POA: Diagnosis not present

## 2017-03-09 DIAGNOSIS — N2581 Secondary hyperparathyroidism of renal origin: Secondary | ICD-10-CM | POA: Diagnosis not present

## 2017-03-09 DIAGNOSIS — Z992 Dependence on renal dialysis: Secondary | ICD-10-CM | POA: Diagnosis not present

## 2017-03-09 DIAGNOSIS — N186 End stage renal disease: Secondary | ICD-10-CM | POA: Diagnosis not present

## 2017-03-09 DIAGNOSIS — D631 Anemia in chronic kidney disease: Secondary | ICD-10-CM | POA: Diagnosis not present

## 2017-03-09 DIAGNOSIS — D509 Iron deficiency anemia, unspecified: Secondary | ICD-10-CM | POA: Diagnosis not present

## 2017-03-11 DIAGNOSIS — Z992 Dependence on renal dialysis: Secondary | ICD-10-CM | POA: Diagnosis not present

## 2017-03-11 DIAGNOSIS — N186 End stage renal disease: Secondary | ICD-10-CM | POA: Diagnosis not present

## 2017-03-11 DIAGNOSIS — D631 Anemia in chronic kidney disease: Secondary | ICD-10-CM | POA: Diagnosis not present

## 2017-03-11 DIAGNOSIS — N2581 Secondary hyperparathyroidism of renal origin: Secondary | ICD-10-CM | POA: Diagnosis not present

## 2017-03-11 DIAGNOSIS — D509 Iron deficiency anemia, unspecified: Secondary | ICD-10-CM | POA: Diagnosis not present

## 2017-03-12 DIAGNOSIS — Z992 Dependence on renal dialysis: Secondary | ICD-10-CM | POA: Diagnosis not present

## 2017-03-12 DIAGNOSIS — N186 End stage renal disease: Secondary | ICD-10-CM | POA: Diagnosis not present

## 2017-03-12 DIAGNOSIS — N2581 Secondary hyperparathyroidism of renal origin: Secondary | ICD-10-CM | POA: Diagnosis not present

## 2017-03-12 DIAGNOSIS — D631 Anemia in chronic kidney disease: Secondary | ICD-10-CM | POA: Diagnosis not present

## 2017-03-12 DIAGNOSIS — D509 Iron deficiency anemia, unspecified: Secondary | ICD-10-CM | POA: Diagnosis not present

## 2017-03-14 DIAGNOSIS — D509 Iron deficiency anemia, unspecified: Secondary | ICD-10-CM | POA: Diagnosis not present

## 2017-03-14 DIAGNOSIS — Z992 Dependence on renal dialysis: Secondary | ICD-10-CM | POA: Diagnosis not present

## 2017-03-14 DIAGNOSIS — D631 Anemia in chronic kidney disease: Secondary | ICD-10-CM | POA: Diagnosis not present

## 2017-03-14 DIAGNOSIS — N186 End stage renal disease: Secondary | ICD-10-CM | POA: Diagnosis not present

## 2017-03-14 DIAGNOSIS — N2581 Secondary hyperparathyroidism of renal origin: Secondary | ICD-10-CM | POA: Diagnosis not present

## 2017-03-16 DIAGNOSIS — N186 End stage renal disease: Secondary | ICD-10-CM | POA: Diagnosis not present

## 2017-03-16 DIAGNOSIS — N2581 Secondary hyperparathyroidism of renal origin: Secondary | ICD-10-CM | POA: Diagnosis not present

## 2017-03-16 DIAGNOSIS — Z992 Dependence on renal dialysis: Secondary | ICD-10-CM | POA: Diagnosis not present

## 2017-03-16 DIAGNOSIS — D631 Anemia in chronic kidney disease: Secondary | ICD-10-CM | POA: Diagnosis not present

## 2017-03-16 DIAGNOSIS — D509 Iron deficiency anemia, unspecified: Secondary | ICD-10-CM | POA: Diagnosis not present

## 2017-03-18 DIAGNOSIS — N186 End stage renal disease: Secondary | ICD-10-CM | POA: Diagnosis not present

## 2017-03-18 DIAGNOSIS — Z992 Dependence on renal dialysis: Secondary | ICD-10-CM | POA: Diagnosis not present

## 2017-03-19 DIAGNOSIS — N2581 Secondary hyperparathyroidism of renal origin: Secondary | ICD-10-CM | POA: Diagnosis not present

## 2017-03-19 DIAGNOSIS — Z992 Dependence on renal dialysis: Secondary | ICD-10-CM | POA: Diagnosis not present

## 2017-03-19 DIAGNOSIS — N186 End stage renal disease: Secondary | ICD-10-CM | POA: Diagnosis not present

## 2017-03-19 DIAGNOSIS — D509 Iron deficiency anemia, unspecified: Secondary | ICD-10-CM | POA: Diagnosis not present

## 2017-03-19 DIAGNOSIS — D631 Anemia in chronic kidney disease: Secondary | ICD-10-CM | POA: Diagnosis not present

## 2017-03-21 DIAGNOSIS — D509 Iron deficiency anemia, unspecified: Secondary | ICD-10-CM | POA: Diagnosis not present

## 2017-03-21 DIAGNOSIS — D631 Anemia in chronic kidney disease: Secondary | ICD-10-CM | POA: Diagnosis not present

## 2017-03-21 DIAGNOSIS — Z992 Dependence on renal dialysis: Secondary | ICD-10-CM | POA: Diagnosis not present

## 2017-03-21 DIAGNOSIS — N2581 Secondary hyperparathyroidism of renal origin: Secondary | ICD-10-CM | POA: Diagnosis not present

## 2017-03-21 DIAGNOSIS — N186 End stage renal disease: Secondary | ICD-10-CM | POA: Diagnosis not present

## 2017-03-23 DIAGNOSIS — D631 Anemia in chronic kidney disease: Secondary | ICD-10-CM | POA: Diagnosis not present

## 2017-03-23 DIAGNOSIS — N186 End stage renal disease: Secondary | ICD-10-CM | POA: Diagnosis not present

## 2017-03-23 DIAGNOSIS — N2581 Secondary hyperparathyroidism of renal origin: Secondary | ICD-10-CM | POA: Diagnosis not present

## 2017-03-23 DIAGNOSIS — Z992 Dependence on renal dialysis: Secondary | ICD-10-CM | POA: Diagnosis not present

## 2017-03-23 DIAGNOSIS — D509 Iron deficiency anemia, unspecified: Secondary | ICD-10-CM | POA: Diagnosis not present

## 2017-03-26 DIAGNOSIS — N2581 Secondary hyperparathyroidism of renal origin: Secondary | ICD-10-CM | POA: Diagnosis not present

## 2017-03-26 DIAGNOSIS — Z992 Dependence on renal dialysis: Secondary | ICD-10-CM | POA: Diagnosis not present

## 2017-03-26 DIAGNOSIS — D509 Iron deficiency anemia, unspecified: Secondary | ICD-10-CM | POA: Diagnosis not present

## 2017-03-26 DIAGNOSIS — N186 End stage renal disease: Secondary | ICD-10-CM | POA: Diagnosis not present

## 2017-03-26 DIAGNOSIS — D631 Anemia in chronic kidney disease: Secondary | ICD-10-CM | POA: Diagnosis not present

## 2017-03-28 DIAGNOSIS — Z992 Dependence on renal dialysis: Secondary | ICD-10-CM | POA: Diagnosis not present

## 2017-03-28 DIAGNOSIS — N2581 Secondary hyperparathyroidism of renal origin: Secondary | ICD-10-CM | POA: Diagnosis not present

## 2017-03-28 DIAGNOSIS — D631 Anemia in chronic kidney disease: Secondary | ICD-10-CM | POA: Diagnosis not present

## 2017-03-28 DIAGNOSIS — D509 Iron deficiency anemia, unspecified: Secondary | ICD-10-CM | POA: Diagnosis not present

## 2017-03-28 DIAGNOSIS — N186 End stage renal disease: Secondary | ICD-10-CM | POA: Diagnosis not present

## 2017-03-30 DIAGNOSIS — N2581 Secondary hyperparathyroidism of renal origin: Secondary | ICD-10-CM | POA: Diagnosis not present

## 2017-03-30 DIAGNOSIS — N186 End stage renal disease: Secondary | ICD-10-CM | POA: Diagnosis not present

## 2017-03-30 DIAGNOSIS — D509 Iron deficiency anemia, unspecified: Secondary | ICD-10-CM | POA: Diagnosis not present

## 2017-03-30 DIAGNOSIS — D631 Anemia in chronic kidney disease: Secondary | ICD-10-CM | POA: Diagnosis not present

## 2017-03-30 DIAGNOSIS — Z992 Dependence on renal dialysis: Secondary | ICD-10-CM | POA: Diagnosis not present

## 2017-04-02 DIAGNOSIS — D631 Anemia in chronic kidney disease: Secondary | ICD-10-CM | POA: Diagnosis not present

## 2017-04-02 DIAGNOSIS — D509 Iron deficiency anemia, unspecified: Secondary | ICD-10-CM | POA: Diagnosis not present

## 2017-04-02 DIAGNOSIS — N186 End stage renal disease: Secondary | ICD-10-CM | POA: Diagnosis not present

## 2017-04-02 DIAGNOSIS — Z992 Dependence on renal dialysis: Secondary | ICD-10-CM | POA: Diagnosis not present

## 2017-04-02 DIAGNOSIS — N2581 Secondary hyperparathyroidism of renal origin: Secondary | ICD-10-CM | POA: Diagnosis not present

## 2017-04-04 DIAGNOSIS — D631 Anemia in chronic kidney disease: Secondary | ICD-10-CM | POA: Diagnosis not present

## 2017-04-04 DIAGNOSIS — D509 Iron deficiency anemia, unspecified: Secondary | ICD-10-CM | POA: Diagnosis not present

## 2017-04-04 DIAGNOSIS — N2581 Secondary hyperparathyroidism of renal origin: Secondary | ICD-10-CM | POA: Diagnosis not present

## 2017-04-04 DIAGNOSIS — Z992 Dependence on renal dialysis: Secondary | ICD-10-CM | POA: Diagnosis not present

## 2017-04-04 DIAGNOSIS — N186 End stage renal disease: Secondary | ICD-10-CM | POA: Diagnosis not present

## 2017-04-06 DIAGNOSIS — Z992 Dependence on renal dialysis: Secondary | ICD-10-CM | POA: Diagnosis not present

## 2017-04-06 DIAGNOSIS — N2581 Secondary hyperparathyroidism of renal origin: Secondary | ICD-10-CM | POA: Diagnosis not present

## 2017-04-06 DIAGNOSIS — N186 End stage renal disease: Secondary | ICD-10-CM | POA: Diagnosis not present

## 2017-04-06 DIAGNOSIS — D509 Iron deficiency anemia, unspecified: Secondary | ICD-10-CM | POA: Diagnosis not present

## 2017-04-06 DIAGNOSIS — D631 Anemia in chronic kidney disease: Secondary | ICD-10-CM | POA: Diagnosis not present

## 2017-04-09 DIAGNOSIS — Z992 Dependence on renal dialysis: Secondary | ICD-10-CM | POA: Diagnosis not present

## 2017-04-09 DIAGNOSIS — N2581 Secondary hyperparathyroidism of renal origin: Secondary | ICD-10-CM | POA: Diagnosis not present

## 2017-04-09 DIAGNOSIS — D509 Iron deficiency anemia, unspecified: Secondary | ICD-10-CM | POA: Diagnosis not present

## 2017-04-09 DIAGNOSIS — N186 End stage renal disease: Secondary | ICD-10-CM | POA: Diagnosis not present

## 2017-04-09 DIAGNOSIS — D631 Anemia in chronic kidney disease: Secondary | ICD-10-CM | POA: Diagnosis not present

## 2017-04-10 DIAGNOSIS — D509 Iron deficiency anemia, unspecified: Secondary | ICD-10-CM | POA: Diagnosis not present

## 2017-04-10 DIAGNOSIS — N186 End stage renal disease: Secondary | ICD-10-CM | POA: Diagnosis not present

## 2017-04-10 DIAGNOSIS — N2581 Secondary hyperparathyroidism of renal origin: Secondary | ICD-10-CM | POA: Diagnosis not present

## 2017-04-10 DIAGNOSIS — Z992 Dependence on renal dialysis: Secondary | ICD-10-CM | POA: Diagnosis not present

## 2017-04-10 DIAGNOSIS — D631 Anemia in chronic kidney disease: Secondary | ICD-10-CM | POA: Diagnosis not present

## 2017-04-11 DIAGNOSIS — Z992 Dependence on renal dialysis: Secondary | ICD-10-CM | POA: Diagnosis not present

## 2017-04-11 DIAGNOSIS — N2581 Secondary hyperparathyroidism of renal origin: Secondary | ICD-10-CM | POA: Diagnosis not present

## 2017-04-11 DIAGNOSIS — D631 Anemia in chronic kidney disease: Secondary | ICD-10-CM | POA: Diagnosis not present

## 2017-04-11 DIAGNOSIS — N186 End stage renal disease: Secondary | ICD-10-CM | POA: Diagnosis not present

## 2017-04-11 DIAGNOSIS — D509 Iron deficiency anemia, unspecified: Secondary | ICD-10-CM | POA: Diagnosis not present

## 2017-04-12 ENCOUNTER — Ambulatory Visit (INDEPENDENT_AMBULATORY_CARE_PROVIDER_SITE_OTHER): Payer: Medicare Other | Admitting: Vascular Surgery

## 2017-04-12 ENCOUNTER — Encounter (INDEPENDENT_AMBULATORY_CARE_PROVIDER_SITE_OTHER): Payer: Medicare Other

## 2017-04-13 DIAGNOSIS — D509 Iron deficiency anemia, unspecified: Secondary | ICD-10-CM | POA: Diagnosis not present

## 2017-04-13 DIAGNOSIS — N2581 Secondary hyperparathyroidism of renal origin: Secondary | ICD-10-CM | POA: Diagnosis not present

## 2017-04-13 DIAGNOSIS — D631 Anemia in chronic kidney disease: Secondary | ICD-10-CM | POA: Diagnosis not present

## 2017-04-13 DIAGNOSIS — N186 End stage renal disease: Secondary | ICD-10-CM | POA: Diagnosis not present

## 2017-04-13 DIAGNOSIS — Z992 Dependence on renal dialysis: Secondary | ICD-10-CM | POA: Diagnosis not present

## 2017-04-16 DIAGNOSIS — N186 End stage renal disease: Secondary | ICD-10-CM | POA: Diagnosis not present

## 2017-04-16 DIAGNOSIS — Z992 Dependence on renal dialysis: Secondary | ICD-10-CM | POA: Diagnosis not present

## 2017-04-16 DIAGNOSIS — N2581 Secondary hyperparathyroidism of renal origin: Secondary | ICD-10-CM | POA: Diagnosis not present

## 2017-04-16 DIAGNOSIS — D509 Iron deficiency anemia, unspecified: Secondary | ICD-10-CM | POA: Diagnosis not present

## 2017-04-16 DIAGNOSIS — D631 Anemia in chronic kidney disease: Secondary | ICD-10-CM | POA: Diagnosis not present

## 2017-04-18 DIAGNOSIS — D509 Iron deficiency anemia, unspecified: Secondary | ICD-10-CM | POA: Diagnosis not present

## 2017-04-18 DIAGNOSIS — N186 End stage renal disease: Secondary | ICD-10-CM | POA: Diagnosis not present

## 2017-04-18 DIAGNOSIS — Z992 Dependence on renal dialysis: Secondary | ICD-10-CM | POA: Diagnosis not present

## 2017-04-18 DIAGNOSIS — N2581 Secondary hyperparathyroidism of renal origin: Secondary | ICD-10-CM | POA: Diagnosis not present

## 2017-04-18 DIAGNOSIS — D631 Anemia in chronic kidney disease: Secondary | ICD-10-CM | POA: Diagnosis not present

## 2017-04-19 DIAGNOSIS — N2581 Secondary hyperparathyroidism of renal origin: Secondary | ICD-10-CM | POA: Diagnosis not present

## 2017-04-19 DIAGNOSIS — D509 Iron deficiency anemia, unspecified: Secondary | ICD-10-CM | POA: Diagnosis not present

## 2017-04-19 DIAGNOSIS — N186 End stage renal disease: Secondary | ICD-10-CM | POA: Diagnosis not present

## 2017-04-19 DIAGNOSIS — Z992 Dependence on renal dialysis: Secondary | ICD-10-CM | POA: Diagnosis not present

## 2017-04-19 DIAGNOSIS — D631 Anemia in chronic kidney disease: Secondary | ICD-10-CM | POA: Diagnosis not present

## 2017-04-20 DIAGNOSIS — D631 Anemia in chronic kidney disease: Secondary | ICD-10-CM | POA: Diagnosis not present

## 2017-04-20 DIAGNOSIS — D509 Iron deficiency anemia, unspecified: Secondary | ICD-10-CM | POA: Diagnosis not present

## 2017-04-20 DIAGNOSIS — N186 End stage renal disease: Secondary | ICD-10-CM | POA: Diagnosis not present

## 2017-04-20 DIAGNOSIS — Z992 Dependence on renal dialysis: Secondary | ICD-10-CM | POA: Diagnosis not present

## 2017-04-20 DIAGNOSIS — N2581 Secondary hyperparathyroidism of renal origin: Secondary | ICD-10-CM | POA: Diagnosis not present

## 2017-04-23 DIAGNOSIS — N2581 Secondary hyperparathyroidism of renal origin: Secondary | ICD-10-CM | POA: Diagnosis not present

## 2017-04-23 DIAGNOSIS — D631 Anemia in chronic kidney disease: Secondary | ICD-10-CM | POA: Diagnosis not present

## 2017-04-23 DIAGNOSIS — D509 Iron deficiency anemia, unspecified: Secondary | ICD-10-CM | POA: Diagnosis not present

## 2017-04-23 DIAGNOSIS — N186 End stage renal disease: Secondary | ICD-10-CM | POA: Diagnosis not present

## 2017-04-23 DIAGNOSIS — Z992 Dependence on renal dialysis: Secondary | ICD-10-CM | POA: Diagnosis not present

## 2017-04-24 ENCOUNTER — Encounter (INDEPENDENT_AMBULATORY_CARE_PROVIDER_SITE_OTHER): Payer: Self-pay | Admitting: Vascular Surgery

## 2017-04-24 ENCOUNTER — Ambulatory Visit (INDEPENDENT_AMBULATORY_CARE_PROVIDER_SITE_OTHER): Payer: Medicare Other

## 2017-04-24 ENCOUNTER — Ambulatory Visit (INDEPENDENT_AMBULATORY_CARE_PROVIDER_SITE_OTHER): Payer: Medicare Other | Admitting: Vascular Surgery

## 2017-04-24 VITALS — BP 155/71 | HR 67 | Resp 15 | Ht 64.0 in | Wt 134.0 lb

## 2017-04-24 DIAGNOSIS — N186 End stage renal disease: Secondary | ICD-10-CM | POA: Diagnosis not present

## 2017-04-24 DIAGNOSIS — T829XXS Unspecified complication of cardiac and vascular prosthetic device, implant and graft, sequela: Secondary | ICD-10-CM

## 2017-04-24 DIAGNOSIS — Z992 Dependence on renal dialysis: Secondary | ICD-10-CM

## 2017-04-24 NOTE — Progress Notes (Signed)
Subjective:    Patient ID: [name], female    DOB: October 04, 1951, 66 y.o.   MRN: [id number] Chief Complaint  Patient presents with  . Re-evaluation    HDA ultrasound follow up   Patient presents for 6 month follow-up. She has a right lower extremity graft. The patient underwent a duplex ultrasound of the AV access which was notable for a patent graft without any significant hemodynamic stenosis. Patient reports her hemodialysis doppler was checked yesterday and is adequate. The patient denies any issues with hemodialysis such as cannulation problems, increased bleeding, decrease in doppler flow or recirculation. The patient also denies any graft skin breakdown, pain, edema, pallor or ulceration of the leg.     Review of Systems  Constitutional: Negative.   HENT: Negative.   Eyes: Negative.   Respiratory: Negative.   Cardiovascular: Negative.   Gastrointestinal: Negative.   Endocrine: Negative.   Genitourinary: Negative.   Musculoskeletal: Negative.   Skin: Negative.   Allergic/Immunologic: Negative.   Neurological: Negative.   Hematological: Negative.   Psychiatric/Behavioral: Negative.        Objective:   Physical Exam  Constitutional: She is oriented to person, place, and time. She appears well-developed and well-nourished. No distress.  HENT:  Head: Normocephalic and atraumatic.  Eyes: Conjunctivae are normal. Pupils are equal, round, and reactive to light.  Neck: Normal range of motion.  Cardiovascular: Normal rate, regular rhythm and normal heart sounds.   Right thigh graft: Skin intact. Bruit and thrill.   Pulmonary/Chest: Effort normal.  Musculoskeletal: Normal range of motion.  Neurological: She is alert and oriented to person, place, and time.  Skin: Skin is warm and dry. She is not diaphoretic.  Psychiatric: She has a normal mood and affect. Her behavior is normal. Judgment and thought content normal.  Vitals reviewed.   BP (!) 155/71 (BP Location: Left  Arm)   Pulse 67   Resp 15   Ht 5\' 4"  (1.626 m)   Wt 134 lb (60.8 kg)   BMI 23.00 kg/m   Past Medical History:  Diagnosis Date  . Chronic systolic heart failure ([place])   . Coronary atherosclerosis of native coronary artery   . Diseases of tricuspid valve   . End stage renal disease ([place])   . Mitral valve insufficiency and aortic valve insufficiency   . Other and unspecified hyperlipidemia   . Secondary cardiomyopathy, unspecified   . Unspecified essential hypertension     Social History   Social History  . Marital status: Single    Spouse name: N/A  . Number of children: N/A  . Years of education: N/A   Occupational History  . Not on file.   Social History Main Topics  . Smoking status: Never Smoker  . Smokeless tobacco: Never Used  . Alcohol use No  . Drug use: No  . Sexual activity: Not on file   Other Topics Concern  . Not on file   Social History Narrative  . No narrative on file    Past Surgical History:  Procedure Laterality Date  . AV FISTULA PLACEMENT Right 2012  . AV FISTULA PLACEMENT Left 2004  . INSERTION OF DIALYSIS CATHETER N/A 01/08/2013   Procedure: INSERTION OF DIALYSIS CATHETER;  Surgeon: Angelia Mould, MD;  Location: [place];  Service: Vascular;  Laterality: N/A;  . KIDNEY TRANSPLANT Left 2002   pt. states transplant lasted 2 yrs.  Marland Kitchen PERIPHERAL VASCULAR CATHETERIZATION Right 08/27/2016   Procedure: A/V Shuntogram/Fistulagram;  Surgeon: Corene Cornea  Bunnie Domino, MD;  Location: [place] [facility];  Service: Cardiovascular;  Laterality: Right;    No family history on file.  Allergies  Allergen Reactions  . Dilaudid [Hydromorphone] Nausea And Vomiting  . Tape Rash    Plastic tape       Assessment & Plan:  Patient presents for 6 month follow-up. She has a right lower extremity graft. The patient underwent a duplex ultrasound of the AV access which was notable for a patent graft without any significant hemodynamic stenosis. Patient reports her  hemodialysis doppler was checked yesterday and is adequate. The patient denies any issues with hemodialysis such as cannulation problems, increased bleeding, decrease in doppler flow or recirculation. The patient also denies any graft skin breakdown, pain, edema, pallor or ulceration of the leg.   1. ESRD on dialysis [facility]) - stable Studies reviewed with patient. The patient is doing well and currently has adequate dialysis access. Duplex ultrasound of the AV access shows a patent access with no evidence of hemodynamically significant strictures or stenosis.  The patient should continue to have duplex ultrasounds of the dialysis access every 6 months. The patient was instructed to call the office in the interim if any issues with dialysis access / doppler flow, pain, edema, pallor, fistula skin breakdown or ulceration of the arm / hand occur. The patient expressed their understanding.  - VAS US DUPLEX DIALYSIS ACCESS (AVF,AVG); Future  2. Complication from renal dialysis device, sequela - none As above.  Current Outpatient Prescriptions on File Prior to Visit  Medication Sig Dispense Refill  . acetaminophen (TYLENOL) 500 MG tablet Take 1,000 mg by mouth every 6 (six) hours as needed for pain (headache).    Marland Kitchen aspirin 81 MG tablet Take 81 mg by mouth daily.    . B Complex-C-Zn-Folic Acid (NEPHPLEX RX PO) Take 1 tablet by mouth daily.    . cinacalcet (SENSIPAR) 30 MG tablet Take 30 mg by mouth daily after supper.    . isosorbide mononitrate (IMDUR) 30 MG 24 hr tablet Take 30 mg by mouth daily.    Marland Kitchen labetalol (NORMODYNE) 300 MG tablet Take 300 mg by mouth 2 (two) times daily.    . minoxidil (LONITEN) 2.5 MG tablet Take 2.5 mg by mouth 2 (two) times daily.    . sevelamer (RENAGEL) 800 MG tablet Take 800-1,600 mg by mouth See admin instructions. Take 2 tablets by mouth with meals and 1 with snacks.    . simvastatin (ZOCOR) 20 MG tablet Take 20 mg by mouth daily.     No current  facility-administered medications on file prior to visit.     There are no Patient Instructions on file for this visit. No Follow-up on file.   Tron Flythe A Mcdonald Reiling, PA-C

## 2017-04-25 DIAGNOSIS — N2581 Secondary hyperparathyroidism of renal origin: Secondary | ICD-10-CM | POA: Diagnosis not present

## 2017-04-25 DIAGNOSIS — D509 Iron deficiency anemia, unspecified: Secondary | ICD-10-CM | POA: Diagnosis not present

## 2017-04-25 DIAGNOSIS — Z992 Dependence on renal dialysis: Secondary | ICD-10-CM | POA: Diagnosis not present

## 2017-04-25 DIAGNOSIS — N186 End stage renal disease: Secondary | ICD-10-CM | POA: Diagnosis not present

## 2017-04-25 DIAGNOSIS — D631 Anemia in chronic kidney disease: Secondary | ICD-10-CM | POA: Diagnosis not present

## 2017-04-27 DIAGNOSIS — Z992 Dependence on renal dialysis: Secondary | ICD-10-CM | POA: Diagnosis not present

## 2017-04-27 DIAGNOSIS — N2581 Secondary hyperparathyroidism of renal origin: Secondary | ICD-10-CM | POA: Diagnosis not present

## 2017-04-27 DIAGNOSIS — D509 Iron deficiency anemia, unspecified: Secondary | ICD-10-CM | POA: Diagnosis not present

## 2017-04-27 DIAGNOSIS — N186 End stage renal disease: Secondary | ICD-10-CM | POA: Diagnosis not present

## 2017-04-27 DIAGNOSIS — D631 Anemia in chronic kidney disease: Secondary | ICD-10-CM | POA: Diagnosis not present

## 2017-04-30 DIAGNOSIS — D509 Iron deficiency anemia, unspecified: Secondary | ICD-10-CM | POA: Diagnosis not present

## 2017-04-30 DIAGNOSIS — Z992 Dependence on renal dialysis: Secondary | ICD-10-CM | POA: Diagnosis not present

## 2017-04-30 DIAGNOSIS — N186 End stage renal disease: Secondary | ICD-10-CM | POA: Diagnosis not present

## 2017-04-30 DIAGNOSIS — D631 Anemia in chronic kidney disease: Secondary | ICD-10-CM | POA: Diagnosis not present

## 2017-04-30 DIAGNOSIS — N2581 Secondary hyperparathyroidism of renal origin: Secondary | ICD-10-CM | POA: Diagnosis not present

## 2017-05-02 DIAGNOSIS — N186 End stage renal disease: Secondary | ICD-10-CM | POA: Diagnosis not present

## 2017-05-02 DIAGNOSIS — D631 Anemia in chronic kidney disease: Secondary | ICD-10-CM | POA: Diagnosis not present

## 2017-05-02 DIAGNOSIS — N2581 Secondary hyperparathyroidism of renal origin: Secondary | ICD-10-CM | POA: Diagnosis not present

## 2017-05-02 DIAGNOSIS — D509 Iron deficiency anemia, unspecified: Secondary | ICD-10-CM | POA: Diagnosis not present

## 2017-05-02 DIAGNOSIS — Z992 Dependence on renal dialysis: Secondary | ICD-10-CM | POA: Diagnosis not present

## 2017-05-04 DIAGNOSIS — N186 End stage renal disease: Secondary | ICD-10-CM | POA: Diagnosis not present

## 2017-05-04 DIAGNOSIS — N2581 Secondary hyperparathyroidism of renal origin: Secondary | ICD-10-CM | POA: Diagnosis not present

## 2017-05-04 DIAGNOSIS — D509 Iron deficiency anemia, unspecified: Secondary | ICD-10-CM | POA: Diagnosis not present

## 2017-05-04 DIAGNOSIS — D631 Anemia in chronic kidney disease: Secondary | ICD-10-CM | POA: Diagnosis not present

## 2017-05-04 DIAGNOSIS — Z992 Dependence on renal dialysis: Secondary | ICD-10-CM | POA: Diagnosis not present

## 2017-05-07 DIAGNOSIS — Z992 Dependence on renal dialysis: Secondary | ICD-10-CM | POA: Diagnosis not present

## 2017-05-07 DIAGNOSIS — N2581 Secondary hyperparathyroidism of renal origin: Secondary | ICD-10-CM | POA: Diagnosis not present

## 2017-05-07 DIAGNOSIS — D509 Iron deficiency anemia, unspecified: Secondary | ICD-10-CM | POA: Diagnosis not present

## 2017-05-07 DIAGNOSIS — D631 Anemia in chronic kidney disease: Secondary | ICD-10-CM | POA: Diagnosis not present

## 2017-05-07 DIAGNOSIS — N186 End stage renal disease: Secondary | ICD-10-CM | POA: Diagnosis not present

## 2017-05-09 DIAGNOSIS — N2581 Secondary hyperparathyroidism of renal origin: Secondary | ICD-10-CM | POA: Diagnosis not present

## 2017-05-09 DIAGNOSIS — D509 Iron deficiency anemia, unspecified: Secondary | ICD-10-CM | POA: Diagnosis not present

## 2017-05-09 DIAGNOSIS — D631 Anemia in chronic kidney disease: Secondary | ICD-10-CM | POA: Diagnosis not present

## 2017-05-09 DIAGNOSIS — N186 End stage renal disease: Secondary | ICD-10-CM | POA: Diagnosis not present

## 2017-05-09 DIAGNOSIS — Z992 Dependence on renal dialysis: Secondary | ICD-10-CM | POA: Diagnosis not present

## 2017-05-10 DIAGNOSIS — D509 Iron deficiency anemia, unspecified: Secondary | ICD-10-CM | POA: Diagnosis not present

## 2017-05-10 DIAGNOSIS — D631 Anemia in chronic kidney disease: Secondary | ICD-10-CM | POA: Diagnosis not present

## 2017-05-10 DIAGNOSIS — N2581 Secondary hyperparathyroidism of renal origin: Secondary | ICD-10-CM | POA: Diagnosis not present

## 2017-05-10 DIAGNOSIS — Z992 Dependence on renal dialysis: Secondary | ICD-10-CM | POA: Diagnosis not present

## 2017-05-10 DIAGNOSIS — N186 End stage renal disease: Secondary | ICD-10-CM | POA: Diagnosis not present

## 2017-05-14 DIAGNOSIS — D631 Anemia in chronic kidney disease: Secondary | ICD-10-CM | POA: Diagnosis not present

## 2017-05-14 DIAGNOSIS — N186 End stage renal disease: Secondary | ICD-10-CM | POA: Diagnosis not present

## 2017-05-14 DIAGNOSIS — D509 Iron deficiency anemia, unspecified: Secondary | ICD-10-CM | POA: Diagnosis not present

## 2017-05-14 DIAGNOSIS — Z992 Dependence on renal dialysis: Secondary | ICD-10-CM | POA: Diagnosis not present

## 2017-05-14 DIAGNOSIS — N2581 Secondary hyperparathyroidism of renal origin: Secondary | ICD-10-CM | POA: Diagnosis not present

## 2017-05-16 DIAGNOSIS — N2581 Secondary hyperparathyroidism of renal origin: Secondary | ICD-10-CM | POA: Diagnosis not present

## 2017-05-16 DIAGNOSIS — N186 End stage renal disease: Secondary | ICD-10-CM | POA: Diagnosis not present

## 2017-05-16 DIAGNOSIS — D509 Iron deficiency anemia, unspecified: Secondary | ICD-10-CM | POA: Diagnosis not present

## 2017-05-16 DIAGNOSIS — D631 Anemia in chronic kidney disease: Secondary | ICD-10-CM | POA: Diagnosis not present

## 2017-05-16 DIAGNOSIS — Z992 Dependence on renal dialysis: Secondary | ICD-10-CM | POA: Diagnosis not present

## 2017-05-18 DIAGNOSIS — N2581 Secondary hyperparathyroidism of renal origin: Secondary | ICD-10-CM | POA: Diagnosis not present

## 2017-05-18 DIAGNOSIS — D631 Anemia in chronic kidney disease: Secondary | ICD-10-CM | POA: Diagnosis not present

## 2017-05-18 DIAGNOSIS — D509 Iron deficiency anemia, unspecified: Secondary | ICD-10-CM | POA: Diagnosis not present

## 2017-05-18 DIAGNOSIS — N186 End stage renal disease: Secondary | ICD-10-CM | POA: Diagnosis not present

## 2017-05-18 DIAGNOSIS — Z992 Dependence on renal dialysis: Secondary | ICD-10-CM | POA: Diagnosis not present

## 2017-05-19 DIAGNOSIS — N186 End stage renal disease: Secondary | ICD-10-CM | POA: Diagnosis not present

## 2017-05-19 DIAGNOSIS — N2581 Secondary hyperparathyroidism of renal origin: Secondary | ICD-10-CM | POA: Diagnosis not present

## 2017-05-19 DIAGNOSIS — D631 Anemia in chronic kidney disease: Secondary | ICD-10-CM | POA: Diagnosis not present

## 2017-05-19 DIAGNOSIS — D509 Iron deficiency anemia, unspecified: Secondary | ICD-10-CM | POA: Diagnosis not present

## 2017-05-19 DIAGNOSIS — Z992 Dependence on renal dialysis: Secondary | ICD-10-CM | POA: Diagnosis not present

## 2017-05-21 DIAGNOSIS — D631 Anemia in chronic kidney disease: Secondary | ICD-10-CM | POA: Diagnosis not present

## 2017-05-21 DIAGNOSIS — D509 Iron deficiency anemia, unspecified: Secondary | ICD-10-CM | POA: Diagnosis not present

## 2017-05-21 DIAGNOSIS — N2581 Secondary hyperparathyroidism of renal origin: Secondary | ICD-10-CM | POA: Diagnosis not present

## 2017-05-21 DIAGNOSIS — N186 End stage renal disease: Secondary | ICD-10-CM | POA: Diagnosis not present

## 2017-05-21 DIAGNOSIS — Z992 Dependence on renal dialysis: Secondary | ICD-10-CM | POA: Diagnosis not present

## 2017-05-23 DIAGNOSIS — Z992 Dependence on renal dialysis: Secondary | ICD-10-CM | POA: Diagnosis not present

## 2017-05-23 DIAGNOSIS — D631 Anemia in chronic kidney disease: Secondary | ICD-10-CM | POA: Diagnosis not present

## 2017-05-23 DIAGNOSIS — N186 End stage renal disease: Secondary | ICD-10-CM | POA: Diagnosis not present

## 2017-05-23 DIAGNOSIS — D509 Iron deficiency anemia, unspecified: Secondary | ICD-10-CM | POA: Diagnosis not present

## 2017-05-23 DIAGNOSIS — N2581 Secondary hyperparathyroidism of renal origin: Secondary | ICD-10-CM | POA: Diagnosis not present

## 2017-05-25 DIAGNOSIS — N2581 Secondary hyperparathyroidism of renal origin: Secondary | ICD-10-CM | POA: Diagnosis not present

## 2017-05-25 DIAGNOSIS — Z992 Dependence on renal dialysis: Secondary | ICD-10-CM | POA: Diagnosis not present

## 2017-05-25 DIAGNOSIS — N186 End stage renal disease: Secondary | ICD-10-CM | POA: Diagnosis not present

## 2017-05-25 DIAGNOSIS — D631 Anemia in chronic kidney disease: Secondary | ICD-10-CM | POA: Diagnosis not present

## 2017-05-25 DIAGNOSIS — D509 Iron deficiency anemia, unspecified: Secondary | ICD-10-CM | POA: Diagnosis not present

## 2017-05-28 DIAGNOSIS — D631 Anemia in chronic kidney disease: Secondary | ICD-10-CM | POA: Diagnosis not present

## 2017-05-28 DIAGNOSIS — Z992 Dependence on renal dialysis: Secondary | ICD-10-CM | POA: Diagnosis not present

## 2017-05-28 DIAGNOSIS — D509 Iron deficiency anemia, unspecified: Secondary | ICD-10-CM | POA: Diagnosis not present

## 2017-05-28 DIAGNOSIS — N186 End stage renal disease: Secondary | ICD-10-CM | POA: Diagnosis not present

## 2017-05-28 DIAGNOSIS — N2581 Secondary hyperparathyroidism of renal origin: Secondary | ICD-10-CM | POA: Diagnosis not present

## 2017-05-30 DIAGNOSIS — D631 Anemia in chronic kidney disease: Secondary | ICD-10-CM | POA: Diagnosis not present

## 2017-05-30 DIAGNOSIS — D509 Iron deficiency anemia, unspecified: Secondary | ICD-10-CM | POA: Diagnosis not present

## 2017-05-30 DIAGNOSIS — N2581 Secondary hyperparathyroidism of renal origin: Secondary | ICD-10-CM | POA: Diagnosis not present

## 2017-05-30 DIAGNOSIS — N186 End stage renal disease: Secondary | ICD-10-CM | POA: Diagnosis not present

## 2017-05-30 DIAGNOSIS — Z992 Dependence on renal dialysis: Secondary | ICD-10-CM | POA: Diagnosis not present

## 2017-06-01 DIAGNOSIS — N2581 Secondary hyperparathyroidism of renal origin: Secondary | ICD-10-CM | POA: Diagnosis not present

## 2017-06-01 DIAGNOSIS — D631 Anemia in chronic kidney disease: Secondary | ICD-10-CM | POA: Diagnosis not present

## 2017-06-01 DIAGNOSIS — N186 End stage renal disease: Secondary | ICD-10-CM | POA: Diagnosis not present

## 2017-06-01 DIAGNOSIS — Z992 Dependence on renal dialysis: Secondary | ICD-10-CM | POA: Diagnosis not present

## 2017-06-01 DIAGNOSIS — D509 Iron deficiency anemia, unspecified: Secondary | ICD-10-CM | POA: Diagnosis not present

## 2017-06-04 DIAGNOSIS — Z992 Dependence on renal dialysis: Secondary | ICD-10-CM | POA: Diagnosis not present

## 2017-06-04 DIAGNOSIS — N2581 Secondary hyperparathyroidism of renal origin: Secondary | ICD-10-CM | POA: Diagnosis not present

## 2017-06-04 DIAGNOSIS — D509 Iron deficiency anemia, unspecified: Secondary | ICD-10-CM | POA: Diagnosis not present

## 2017-06-04 DIAGNOSIS — N186 End stage renal disease: Secondary | ICD-10-CM | POA: Diagnosis not present

## 2017-06-04 DIAGNOSIS — D631 Anemia in chronic kidney disease: Secondary | ICD-10-CM | POA: Diagnosis not present

## 2017-06-06 DIAGNOSIS — N2581 Secondary hyperparathyroidism of renal origin: Secondary | ICD-10-CM | POA: Diagnosis not present

## 2017-06-06 DIAGNOSIS — D631 Anemia in chronic kidney disease: Secondary | ICD-10-CM | POA: Diagnosis not present

## 2017-06-06 DIAGNOSIS — N186 End stage renal disease: Secondary | ICD-10-CM | POA: Diagnosis not present

## 2017-06-06 DIAGNOSIS — Z992 Dependence on renal dialysis: Secondary | ICD-10-CM | POA: Diagnosis not present

## 2017-06-06 DIAGNOSIS — D509 Iron deficiency anemia, unspecified: Secondary | ICD-10-CM | POA: Diagnosis not present

## 2017-06-08 DIAGNOSIS — N2581 Secondary hyperparathyroidism of renal origin: Secondary | ICD-10-CM | POA: Diagnosis not present

## 2017-06-08 DIAGNOSIS — D631 Anemia in chronic kidney disease: Secondary | ICD-10-CM | POA: Diagnosis not present

## 2017-06-08 DIAGNOSIS — N186 End stage renal disease: Secondary | ICD-10-CM | POA: Diagnosis not present

## 2017-06-08 DIAGNOSIS — D509 Iron deficiency anemia, unspecified: Secondary | ICD-10-CM | POA: Diagnosis not present

## 2017-06-08 DIAGNOSIS — Z992 Dependence on renal dialysis: Secondary | ICD-10-CM | POA: Diagnosis not present

## 2017-06-09 DIAGNOSIS — N186 End stage renal disease: Secondary | ICD-10-CM | POA: Diagnosis not present

## 2017-06-09 DIAGNOSIS — D631 Anemia in chronic kidney disease: Secondary | ICD-10-CM | POA: Diagnosis not present

## 2017-06-09 DIAGNOSIS — N2581 Secondary hyperparathyroidism of renal origin: Secondary | ICD-10-CM | POA: Diagnosis not present

## 2017-06-09 DIAGNOSIS — Z992 Dependence on renal dialysis: Secondary | ICD-10-CM | POA: Diagnosis not present

## 2017-06-09 DIAGNOSIS — D509 Iron deficiency anemia, unspecified: Secondary | ICD-10-CM | POA: Diagnosis not present

## 2017-06-10 DIAGNOSIS — N186 End stage renal disease: Secondary | ICD-10-CM | POA: Diagnosis not present

## 2017-06-10 DIAGNOSIS — N2581 Secondary hyperparathyroidism of renal origin: Secondary | ICD-10-CM | POA: Diagnosis not present

## 2017-06-10 DIAGNOSIS — Z992 Dependence on renal dialysis: Secondary | ICD-10-CM | POA: Diagnosis not present

## 2017-06-10 DIAGNOSIS — D509 Iron deficiency anemia, unspecified: Secondary | ICD-10-CM | POA: Diagnosis not present

## 2017-06-10 DIAGNOSIS — D631 Anemia in chronic kidney disease: Secondary | ICD-10-CM | POA: Diagnosis not present

## 2017-06-11 DIAGNOSIS — D631 Anemia in chronic kidney disease: Secondary | ICD-10-CM | POA: Diagnosis not present

## 2017-06-11 DIAGNOSIS — Z992 Dependence on renal dialysis: Secondary | ICD-10-CM | POA: Diagnosis not present

## 2017-06-11 DIAGNOSIS — N186 End stage renal disease: Secondary | ICD-10-CM | POA: Diagnosis not present

## 2017-06-11 DIAGNOSIS — D509 Iron deficiency anemia, unspecified: Secondary | ICD-10-CM | POA: Diagnosis not present

## 2017-06-11 DIAGNOSIS — N2581 Secondary hyperparathyroidism of renal origin: Secondary | ICD-10-CM | POA: Diagnosis not present

## 2017-06-13 DIAGNOSIS — N2581 Secondary hyperparathyroidism of renal origin: Secondary | ICD-10-CM | POA: Diagnosis not present

## 2017-06-13 DIAGNOSIS — D509 Iron deficiency anemia, unspecified: Secondary | ICD-10-CM | POA: Diagnosis not present

## 2017-06-13 DIAGNOSIS — N186 End stage renal disease: Secondary | ICD-10-CM | POA: Diagnosis not present

## 2017-06-13 DIAGNOSIS — Z992 Dependence on renal dialysis: Secondary | ICD-10-CM | POA: Diagnosis not present

## 2017-06-13 DIAGNOSIS — D631 Anemia in chronic kidney disease: Secondary | ICD-10-CM | POA: Diagnosis not present

## 2017-06-15 DIAGNOSIS — Z992 Dependence on renal dialysis: Secondary | ICD-10-CM | POA: Diagnosis not present

## 2017-06-15 DIAGNOSIS — D631 Anemia in chronic kidney disease: Secondary | ICD-10-CM | POA: Diagnosis not present

## 2017-06-15 DIAGNOSIS — N186 End stage renal disease: Secondary | ICD-10-CM | POA: Diagnosis not present

## 2017-06-15 DIAGNOSIS — N2581 Secondary hyperparathyroidism of renal origin: Secondary | ICD-10-CM | POA: Diagnosis not present

## 2017-06-15 DIAGNOSIS — D509 Iron deficiency anemia, unspecified: Secondary | ICD-10-CM | POA: Diagnosis not present

## 2017-06-18 DIAGNOSIS — D509 Iron deficiency anemia, unspecified: Secondary | ICD-10-CM | POA: Diagnosis not present

## 2017-06-18 DIAGNOSIS — N2581 Secondary hyperparathyroidism of renal origin: Secondary | ICD-10-CM | POA: Diagnosis not present

## 2017-06-18 DIAGNOSIS — D631 Anemia in chronic kidney disease: Secondary | ICD-10-CM | POA: Diagnosis not present

## 2017-06-18 DIAGNOSIS — N186 End stage renal disease: Secondary | ICD-10-CM | POA: Diagnosis not present

## 2017-06-18 DIAGNOSIS — Z992 Dependence on renal dialysis: Secondary | ICD-10-CM | POA: Diagnosis not present

## 2017-06-19 DIAGNOSIS — N2581 Secondary hyperparathyroidism of renal origin: Secondary | ICD-10-CM | POA: Diagnosis not present

## 2017-06-19 DIAGNOSIS — Z992 Dependence on renal dialysis: Secondary | ICD-10-CM | POA: Diagnosis not present

## 2017-06-19 DIAGNOSIS — D509 Iron deficiency anemia, unspecified: Secondary | ICD-10-CM | POA: Diagnosis not present

## 2017-06-19 DIAGNOSIS — N186 End stage renal disease: Secondary | ICD-10-CM | POA: Diagnosis not present

## 2017-06-19 DIAGNOSIS — D631 Anemia in chronic kidney disease: Secondary | ICD-10-CM | POA: Diagnosis not present

## 2017-06-20 DIAGNOSIS — N186 End stage renal disease: Secondary | ICD-10-CM | POA: Diagnosis not present

## 2017-06-20 DIAGNOSIS — N2581 Secondary hyperparathyroidism of renal origin: Secondary | ICD-10-CM | POA: Diagnosis not present

## 2017-06-20 DIAGNOSIS — Z992 Dependence on renal dialysis: Secondary | ICD-10-CM | POA: Diagnosis not present

## 2017-06-20 DIAGNOSIS — D631 Anemia in chronic kidney disease: Secondary | ICD-10-CM | POA: Diagnosis not present

## 2017-06-20 DIAGNOSIS — D509 Iron deficiency anemia, unspecified: Secondary | ICD-10-CM | POA: Diagnosis not present

## 2017-06-22 DIAGNOSIS — D631 Anemia in chronic kidney disease: Secondary | ICD-10-CM | POA: Diagnosis not present

## 2017-06-22 DIAGNOSIS — Z992 Dependence on renal dialysis: Secondary | ICD-10-CM | POA: Diagnosis not present

## 2017-06-22 DIAGNOSIS — D509 Iron deficiency anemia, unspecified: Secondary | ICD-10-CM | POA: Diagnosis not present

## 2017-06-22 DIAGNOSIS — N186 End stage renal disease: Secondary | ICD-10-CM | POA: Diagnosis not present

## 2017-06-22 DIAGNOSIS — N2581 Secondary hyperparathyroidism of renal origin: Secondary | ICD-10-CM | POA: Diagnosis not present

## 2017-06-25 DIAGNOSIS — N2581 Secondary hyperparathyroidism of renal origin: Secondary | ICD-10-CM | POA: Diagnosis not present

## 2017-06-25 DIAGNOSIS — Z992 Dependence on renal dialysis: Secondary | ICD-10-CM | POA: Diagnosis not present

## 2017-06-25 DIAGNOSIS — N186 End stage renal disease: Secondary | ICD-10-CM | POA: Diagnosis not present

## 2017-06-25 DIAGNOSIS — D631 Anemia in chronic kidney disease: Secondary | ICD-10-CM | POA: Diagnosis not present

## 2017-06-25 DIAGNOSIS — D509 Iron deficiency anemia, unspecified: Secondary | ICD-10-CM | POA: Diagnosis not present

## 2017-06-27 DIAGNOSIS — Z992 Dependence on renal dialysis: Secondary | ICD-10-CM | POA: Diagnosis not present

## 2017-06-27 DIAGNOSIS — D509 Iron deficiency anemia, unspecified: Secondary | ICD-10-CM | POA: Diagnosis not present

## 2017-06-27 DIAGNOSIS — N2581 Secondary hyperparathyroidism of renal origin: Secondary | ICD-10-CM | POA: Diagnosis not present

## 2017-06-27 DIAGNOSIS — D631 Anemia in chronic kidney disease: Secondary | ICD-10-CM | POA: Diagnosis not present

## 2017-06-27 DIAGNOSIS — N186 End stage renal disease: Secondary | ICD-10-CM | POA: Diagnosis not present

## 2017-06-29 DIAGNOSIS — D631 Anemia in chronic kidney disease: Secondary | ICD-10-CM | POA: Diagnosis not present

## 2017-06-29 DIAGNOSIS — D509 Iron deficiency anemia, unspecified: Secondary | ICD-10-CM | POA: Diagnosis not present

## 2017-06-29 DIAGNOSIS — N186 End stage renal disease: Secondary | ICD-10-CM | POA: Diagnosis not present

## 2017-06-29 DIAGNOSIS — Z992 Dependence on renal dialysis: Secondary | ICD-10-CM | POA: Diagnosis not present

## 2017-06-29 DIAGNOSIS — N2581 Secondary hyperparathyroidism of renal origin: Secondary | ICD-10-CM | POA: Diagnosis not present

## 2017-07-02 DIAGNOSIS — N2581 Secondary hyperparathyroidism of renal origin: Secondary | ICD-10-CM | POA: Diagnosis not present

## 2017-07-02 DIAGNOSIS — D631 Anemia in chronic kidney disease: Secondary | ICD-10-CM | POA: Diagnosis not present

## 2017-07-02 DIAGNOSIS — N186 End stage renal disease: Secondary | ICD-10-CM | POA: Diagnosis not present

## 2017-07-02 DIAGNOSIS — Z992 Dependence on renal dialysis: Secondary | ICD-10-CM | POA: Diagnosis not present

## 2017-07-02 DIAGNOSIS — D509 Iron deficiency anemia, unspecified: Secondary | ICD-10-CM | POA: Diagnosis not present

## 2017-07-03 DIAGNOSIS — Z992 Dependence on renal dialysis: Secondary | ICD-10-CM | POA: Diagnosis not present

## 2017-07-03 DIAGNOSIS — D649 Anemia, unspecified: Secondary | ICD-10-CM | POA: Diagnosis not present

## 2017-07-03 DIAGNOSIS — Z6823 Body mass index (BMI) 23.0-23.9, adult: Secondary | ICD-10-CM | POA: Diagnosis not present

## 2017-07-03 DIAGNOSIS — I10 Essential (primary) hypertension: Secondary | ICD-10-CM | POA: Diagnosis not present

## 2017-07-03 DIAGNOSIS — I270 Primary pulmonary hypertension: Secondary | ICD-10-CM | POA: Diagnosis not present

## 2017-07-03 DIAGNOSIS — Z299 Encounter for prophylactic measures, unspecified: Secondary | ICD-10-CM | POA: Diagnosis not present

## 2017-07-03 DIAGNOSIS — Z713 Dietary counseling and surveillance: Secondary | ICD-10-CM | POA: Diagnosis not present

## 2017-07-03 DIAGNOSIS — N186 End stage renal disease: Secondary | ICD-10-CM | POA: Diagnosis not present

## 2017-07-04 DIAGNOSIS — N2581 Secondary hyperparathyroidism of renal origin: Secondary | ICD-10-CM | POA: Diagnosis not present

## 2017-07-04 DIAGNOSIS — N186 End stage renal disease: Secondary | ICD-10-CM | POA: Diagnosis not present

## 2017-07-04 DIAGNOSIS — D509 Iron deficiency anemia, unspecified: Secondary | ICD-10-CM | POA: Diagnosis not present

## 2017-07-04 DIAGNOSIS — Z992 Dependence on renal dialysis: Secondary | ICD-10-CM | POA: Diagnosis not present

## 2017-07-04 DIAGNOSIS — D631 Anemia in chronic kidney disease: Secondary | ICD-10-CM | POA: Diagnosis not present

## 2017-07-06 DIAGNOSIS — Z992 Dependence on renal dialysis: Secondary | ICD-10-CM | POA: Diagnosis not present

## 2017-07-06 DIAGNOSIS — N2581 Secondary hyperparathyroidism of renal origin: Secondary | ICD-10-CM | POA: Diagnosis not present

## 2017-07-06 DIAGNOSIS — N186 End stage renal disease: Secondary | ICD-10-CM | POA: Diagnosis not present

## 2017-07-06 DIAGNOSIS — D631 Anemia in chronic kidney disease: Secondary | ICD-10-CM | POA: Diagnosis not present

## 2017-07-06 DIAGNOSIS — D509 Iron deficiency anemia, unspecified: Secondary | ICD-10-CM | POA: Diagnosis not present

## 2017-07-09 DIAGNOSIS — Z992 Dependence on renal dialysis: Secondary | ICD-10-CM | POA: Diagnosis not present

## 2017-07-09 DIAGNOSIS — N186 End stage renal disease: Secondary | ICD-10-CM | POA: Diagnosis not present

## 2017-07-09 DIAGNOSIS — N2581 Secondary hyperparathyroidism of renal origin: Secondary | ICD-10-CM | POA: Diagnosis not present

## 2017-07-09 DIAGNOSIS — D509 Iron deficiency anemia, unspecified: Secondary | ICD-10-CM | POA: Diagnosis not present

## 2017-07-09 DIAGNOSIS — D631 Anemia in chronic kidney disease: Secondary | ICD-10-CM | POA: Diagnosis not present

## 2017-07-10 DIAGNOSIS — N186 End stage renal disease: Secondary | ICD-10-CM | POA: Diagnosis not present

## 2017-07-10 DIAGNOSIS — N2581 Secondary hyperparathyroidism of renal origin: Secondary | ICD-10-CM | POA: Diagnosis not present

## 2017-07-10 DIAGNOSIS — D509 Iron deficiency anemia, unspecified: Secondary | ICD-10-CM | POA: Diagnosis not present

## 2017-07-10 DIAGNOSIS — Z992 Dependence on renal dialysis: Secondary | ICD-10-CM | POA: Diagnosis not present

## 2017-07-10 DIAGNOSIS — D631 Anemia in chronic kidney disease: Secondary | ICD-10-CM | POA: Diagnosis not present

## 2017-07-11 DIAGNOSIS — D509 Iron deficiency anemia, unspecified: Secondary | ICD-10-CM | POA: Diagnosis not present

## 2017-07-11 DIAGNOSIS — N2581 Secondary hyperparathyroidism of renal origin: Secondary | ICD-10-CM | POA: Diagnosis not present

## 2017-07-11 DIAGNOSIS — Z992 Dependence on renal dialysis: Secondary | ICD-10-CM | POA: Diagnosis not present

## 2017-07-11 DIAGNOSIS — D631 Anemia in chronic kidney disease: Secondary | ICD-10-CM | POA: Diagnosis not present

## 2017-07-11 DIAGNOSIS — N186 End stage renal disease: Secondary | ICD-10-CM | POA: Diagnosis not present

## 2017-07-13 DIAGNOSIS — N2581 Secondary hyperparathyroidism of renal origin: Secondary | ICD-10-CM | POA: Diagnosis not present

## 2017-07-13 DIAGNOSIS — N186 End stage renal disease: Secondary | ICD-10-CM | POA: Diagnosis not present

## 2017-07-13 DIAGNOSIS — D631 Anemia in chronic kidney disease: Secondary | ICD-10-CM | POA: Diagnosis not present

## 2017-07-13 DIAGNOSIS — Z992 Dependence on renal dialysis: Secondary | ICD-10-CM | POA: Diagnosis not present

## 2017-07-13 DIAGNOSIS — D509 Iron deficiency anemia, unspecified: Secondary | ICD-10-CM | POA: Diagnosis not present

## 2017-07-16 DIAGNOSIS — D631 Anemia in chronic kidney disease: Secondary | ICD-10-CM | POA: Diagnosis not present

## 2017-07-16 DIAGNOSIS — N2581 Secondary hyperparathyroidism of renal origin: Secondary | ICD-10-CM | POA: Diagnosis not present

## 2017-07-16 DIAGNOSIS — D509 Iron deficiency anemia, unspecified: Secondary | ICD-10-CM | POA: Diagnosis not present

## 2017-07-16 DIAGNOSIS — N186 End stage renal disease: Secondary | ICD-10-CM | POA: Diagnosis not present

## 2017-07-16 DIAGNOSIS — Z992 Dependence on renal dialysis: Secondary | ICD-10-CM | POA: Diagnosis not present

## 2017-07-18 DIAGNOSIS — D509 Iron deficiency anemia, unspecified: Secondary | ICD-10-CM | POA: Diagnosis not present

## 2017-07-18 DIAGNOSIS — N186 End stage renal disease: Secondary | ICD-10-CM | POA: Diagnosis not present

## 2017-07-18 DIAGNOSIS — Z992 Dependence on renal dialysis: Secondary | ICD-10-CM | POA: Diagnosis not present

## 2017-07-18 DIAGNOSIS — D631 Anemia in chronic kidney disease: Secondary | ICD-10-CM | POA: Diagnosis not present

## 2017-07-18 DIAGNOSIS — N2581 Secondary hyperparathyroidism of renal origin: Secondary | ICD-10-CM | POA: Diagnosis not present

## 2017-07-19 DIAGNOSIS — D509 Iron deficiency anemia, unspecified: Secondary | ICD-10-CM | POA: Diagnosis not present

## 2017-07-19 DIAGNOSIS — Z992 Dependence on renal dialysis: Secondary | ICD-10-CM | POA: Diagnosis not present

## 2017-07-19 DIAGNOSIS — N2581 Secondary hyperparathyroidism of renal origin: Secondary | ICD-10-CM | POA: Diagnosis not present

## 2017-07-19 DIAGNOSIS — N186 End stage renal disease: Secondary | ICD-10-CM | POA: Diagnosis not present

## 2017-07-19 DIAGNOSIS — D631 Anemia in chronic kidney disease: Secondary | ICD-10-CM | POA: Diagnosis not present

## 2017-07-20 DIAGNOSIS — N2581 Secondary hyperparathyroidism of renal origin: Secondary | ICD-10-CM | POA: Diagnosis not present

## 2017-07-20 DIAGNOSIS — D509 Iron deficiency anemia, unspecified: Secondary | ICD-10-CM | POA: Diagnosis not present

## 2017-07-20 DIAGNOSIS — N186 End stage renal disease: Secondary | ICD-10-CM | POA: Diagnosis not present

## 2017-07-20 DIAGNOSIS — Z992 Dependence on renal dialysis: Secondary | ICD-10-CM | POA: Diagnosis not present

## 2017-07-20 DIAGNOSIS — D631 Anemia in chronic kidney disease: Secondary | ICD-10-CM | POA: Diagnosis not present

## 2017-07-23 DIAGNOSIS — Z992 Dependence on renal dialysis: Secondary | ICD-10-CM | POA: Diagnosis not present

## 2017-07-23 DIAGNOSIS — D631 Anemia in chronic kidney disease: Secondary | ICD-10-CM | POA: Diagnosis not present

## 2017-07-23 DIAGNOSIS — N2581 Secondary hyperparathyroidism of renal origin: Secondary | ICD-10-CM | POA: Diagnosis not present

## 2017-07-23 DIAGNOSIS — D509 Iron deficiency anemia, unspecified: Secondary | ICD-10-CM | POA: Diagnosis not present

## 2017-07-23 DIAGNOSIS — N186 End stage renal disease: Secondary | ICD-10-CM | POA: Diagnosis not present

## 2017-07-25 DIAGNOSIS — Z992 Dependence on renal dialysis: Secondary | ICD-10-CM | POA: Diagnosis not present

## 2017-07-25 DIAGNOSIS — D509 Iron deficiency anemia, unspecified: Secondary | ICD-10-CM | POA: Diagnosis not present

## 2017-07-25 DIAGNOSIS — N186 End stage renal disease: Secondary | ICD-10-CM | POA: Diagnosis not present

## 2017-07-25 DIAGNOSIS — N2581 Secondary hyperparathyroidism of renal origin: Secondary | ICD-10-CM | POA: Diagnosis not present

## 2017-07-25 DIAGNOSIS — D631 Anemia in chronic kidney disease: Secondary | ICD-10-CM | POA: Diagnosis not present

## 2017-07-27 DIAGNOSIS — D631 Anemia in chronic kidney disease: Secondary | ICD-10-CM | POA: Diagnosis not present

## 2017-07-27 DIAGNOSIS — N2581 Secondary hyperparathyroidism of renal origin: Secondary | ICD-10-CM | POA: Diagnosis not present

## 2017-07-27 DIAGNOSIS — D509 Iron deficiency anemia, unspecified: Secondary | ICD-10-CM | POA: Diagnosis not present

## 2017-07-27 DIAGNOSIS — N186 End stage renal disease: Secondary | ICD-10-CM | POA: Diagnosis not present

## 2017-07-27 DIAGNOSIS — Z992 Dependence on renal dialysis: Secondary | ICD-10-CM | POA: Diagnosis not present

## 2017-07-29 DIAGNOSIS — E7800 Pure hypercholesterolemia, unspecified: Secondary | ICD-10-CM | POA: Diagnosis not present

## 2017-07-29 DIAGNOSIS — I10 Essential (primary) hypertension: Secondary | ICD-10-CM | POA: Diagnosis not present

## 2017-07-30 DIAGNOSIS — N186 End stage renal disease: Secondary | ICD-10-CM | POA: Diagnosis not present

## 2017-07-30 DIAGNOSIS — D631 Anemia in chronic kidney disease: Secondary | ICD-10-CM | POA: Diagnosis not present

## 2017-07-30 DIAGNOSIS — Z992 Dependence on renal dialysis: Secondary | ICD-10-CM | POA: Diagnosis not present

## 2017-07-30 DIAGNOSIS — D509 Iron deficiency anemia, unspecified: Secondary | ICD-10-CM | POA: Diagnosis not present

## 2017-07-30 DIAGNOSIS — N2581 Secondary hyperparathyroidism of renal origin: Secondary | ICD-10-CM | POA: Diagnosis not present

## 2017-08-01 DIAGNOSIS — N186 End stage renal disease: Secondary | ICD-10-CM | POA: Diagnosis not present

## 2017-08-01 DIAGNOSIS — Z992 Dependence on renal dialysis: Secondary | ICD-10-CM | POA: Diagnosis not present

## 2017-08-01 DIAGNOSIS — D631 Anemia in chronic kidney disease: Secondary | ICD-10-CM | POA: Diagnosis not present

## 2017-08-01 DIAGNOSIS — D509 Iron deficiency anemia, unspecified: Secondary | ICD-10-CM | POA: Diagnosis not present

## 2017-08-01 DIAGNOSIS — N2581 Secondary hyperparathyroidism of renal origin: Secondary | ICD-10-CM | POA: Diagnosis not present

## 2017-08-03 DIAGNOSIS — Z992 Dependence on renal dialysis: Secondary | ICD-10-CM | POA: Diagnosis not present

## 2017-08-03 DIAGNOSIS — N186 End stage renal disease: Secondary | ICD-10-CM | POA: Diagnosis not present

## 2017-08-03 DIAGNOSIS — D631 Anemia in chronic kidney disease: Secondary | ICD-10-CM | POA: Diagnosis not present

## 2017-08-03 DIAGNOSIS — D509 Iron deficiency anemia, unspecified: Secondary | ICD-10-CM | POA: Diagnosis not present

## 2017-08-03 DIAGNOSIS — N2581 Secondary hyperparathyroidism of renal origin: Secondary | ICD-10-CM | POA: Diagnosis not present

## 2017-08-06 DIAGNOSIS — D631 Anemia in chronic kidney disease: Secondary | ICD-10-CM | POA: Diagnosis not present

## 2017-08-06 DIAGNOSIS — N186 End stage renal disease: Secondary | ICD-10-CM | POA: Diagnosis not present

## 2017-08-06 DIAGNOSIS — Z992 Dependence on renal dialysis: Secondary | ICD-10-CM | POA: Diagnosis not present

## 2017-08-06 DIAGNOSIS — D509 Iron deficiency anemia, unspecified: Secondary | ICD-10-CM | POA: Diagnosis not present

## 2017-08-06 DIAGNOSIS — N2581 Secondary hyperparathyroidism of renal origin: Secondary | ICD-10-CM | POA: Diagnosis not present

## 2017-08-08 DIAGNOSIS — N2581 Secondary hyperparathyroidism of renal origin: Secondary | ICD-10-CM | POA: Diagnosis not present

## 2017-08-08 DIAGNOSIS — N186 End stage renal disease: Secondary | ICD-10-CM | POA: Diagnosis not present

## 2017-08-08 DIAGNOSIS — D631 Anemia in chronic kidney disease: Secondary | ICD-10-CM | POA: Diagnosis not present

## 2017-08-08 DIAGNOSIS — D509 Iron deficiency anemia, unspecified: Secondary | ICD-10-CM | POA: Diagnosis not present

## 2017-08-08 DIAGNOSIS — Z992 Dependence on renal dialysis: Secondary | ICD-10-CM | POA: Diagnosis not present

## 2017-08-09 DIAGNOSIS — N186 End stage renal disease: Secondary | ICD-10-CM | POA: Diagnosis not present

## 2017-08-09 DIAGNOSIS — D631 Anemia in chronic kidney disease: Secondary | ICD-10-CM | POA: Diagnosis not present

## 2017-08-09 DIAGNOSIS — Z992 Dependence on renal dialysis: Secondary | ICD-10-CM | POA: Diagnosis not present

## 2017-08-09 DIAGNOSIS — N2581 Secondary hyperparathyroidism of renal origin: Secondary | ICD-10-CM | POA: Diagnosis not present

## 2017-08-09 DIAGNOSIS — D509 Iron deficiency anemia, unspecified: Secondary | ICD-10-CM | POA: Diagnosis not present

## 2017-08-10 DIAGNOSIS — N2581 Secondary hyperparathyroidism of renal origin: Secondary | ICD-10-CM | POA: Diagnosis not present

## 2017-08-10 DIAGNOSIS — Z992 Dependence on renal dialysis: Secondary | ICD-10-CM | POA: Diagnosis not present

## 2017-08-10 DIAGNOSIS — D631 Anemia in chronic kidney disease: Secondary | ICD-10-CM | POA: Diagnosis not present

## 2017-08-10 DIAGNOSIS — N186 End stage renal disease: Secondary | ICD-10-CM | POA: Diagnosis not present

## 2017-08-10 DIAGNOSIS — D509 Iron deficiency anemia, unspecified: Secondary | ICD-10-CM | POA: Diagnosis not present

## 2017-08-13 DIAGNOSIS — Z992 Dependence on renal dialysis: Secondary | ICD-10-CM | POA: Diagnosis not present

## 2017-08-13 DIAGNOSIS — N186 End stage renal disease: Secondary | ICD-10-CM | POA: Diagnosis not present

## 2017-08-13 DIAGNOSIS — D509 Iron deficiency anemia, unspecified: Secondary | ICD-10-CM | POA: Diagnosis not present

## 2017-08-13 DIAGNOSIS — D631 Anemia in chronic kidney disease: Secondary | ICD-10-CM | POA: Diagnosis not present

## 2017-08-13 DIAGNOSIS — N2581 Secondary hyperparathyroidism of renal origin: Secondary | ICD-10-CM | POA: Diagnosis not present

## 2017-08-15 DIAGNOSIS — N2581 Secondary hyperparathyroidism of renal origin: Secondary | ICD-10-CM | POA: Diagnosis not present

## 2017-08-15 DIAGNOSIS — Z992 Dependence on renal dialysis: Secondary | ICD-10-CM | POA: Diagnosis not present

## 2017-08-15 DIAGNOSIS — D509 Iron deficiency anemia, unspecified: Secondary | ICD-10-CM | POA: Diagnosis not present

## 2017-08-15 DIAGNOSIS — D631 Anemia in chronic kidney disease: Secondary | ICD-10-CM | POA: Diagnosis not present

## 2017-08-15 DIAGNOSIS — N186 End stage renal disease: Secondary | ICD-10-CM | POA: Diagnosis not present

## 2017-08-17 DIAGNOSIS — D631 Anemia in chronic kidney disease: Secondary | ICD-10-CM | POA: Diagnosis not present

## 2017-08-17 DIAGNOSIS — Z992 Dependence on renal dialysis: Secondary | ICD-10-CM | POA: Diagnosis not present

## 2017-08-17 DIAGNOSIS — N186 End stage renal disease: Secondary | ICD-10-CM | POA: Diagnosis not present

## 2017-08-17 DIAGNOSIS — N2581 Secondary hyperparathyroidism of renal origin: Secondary | ICD-10-CM | POA: Diagnosis not present

## 2017-08-17 DIAGNOSIS — D509 Iron deficiency anemia, unspecified: Secondary | ICD-10-CM | POA: Diagnosis not present

## 2017-08-18 DIAGNOSIS — Z992 Dependence on renal dialysis: Secondary | ICD-10-CM | POA: Diagnosis not present

## 2017-08-18 DIAGNOSIS — N186 End stage renal disease: Secondary | ICD-10-CM | POA: Diagnosis not present

## 2017-08-19 DIAGNOSIS — Z992 Dependence on renal dialysis: Secondary | ICD-10-CM | POA: Diagnosis not present

## 2017-08-19 DIAGNOSIS — Z23 Encounter for immunization: Secondary | ICD-10-CM | POA: Diagnosis not present

## 2017-08-19 DIAGNOSIS — N2581 Secondary hyperparathyroidism of renal origin: Secondary | ICD-10-CM | POA: Diagnosis not present

## 2017-08-19 DIAGNOSIS — D631 Anemia in chronic kidney disease: Secondary | ICD-10-CM | POA: Diagnosis not present

## 2017-08-19 DIAGNOSIS — D509 Iron deficiency anemia, unspecified: Secondary | ICD-10-CM | POA: Diagnosis not present

## 2017-08-19 DIAGNOSIS — N186 End stage renal disease: Secondary | ICD-10-CM | POA: Diagnosis not present

## 2017-08-20 DIAGNOSIS — D509 Iron deficiency anemia, unspecified: Secondary | ICD-10-CM | POA: Diagnosis not present

## 2017-08-20 DIAGNOSIS — N2581 Secondary hyperparathyroidism of renal origin: Secondary | ICD-10-CM | POA: Diagnosis not present

## 2017-08-20 DIAGNOSIS — N186 End stage renal disease: Secondary | ICD-10-CM | POA: Diagnosis not present

## 2017-08-20 DIAGNOSIS — Z23 Encounter for immunization: Secondary | ICD-10-CM | POA: Diagnosis not present

## 2017-08-20 DIAGNOSIS — Z992 Dependence on renal dialysis: Secondary | ICD-10-CM | POA: Diagnosis not present

## 2017-08-20 DIAGNOSIS — D631 Anemia in chronic kidney disease: Secondary | ICD-10-CM | POA: Diagnosis not present

## 2017-08-22 DIAGNOSIS — Z992 Dependence on renal dialysis: Secondary | ICD-10-CM | POA: Diagnosis not present

## 2017-08-22 DIAGNOSIS — N186 End stage renal disease: Secondary | ICD-10-CM | POA: Diagnosis not present

## 2017-08-22 DIAGNOSIS — D631 Anemia in chronic kidney disease: Secondary | ICD-10-CM | POA: Diagnosis not present

## 2017-08-22 DIAGNOSIS — N2581 Secondary hyperparathyroidism of renal origin: Secondary | ICD-10-CM | POA: Diagnosis not present

## 2017-08-22 DIAGNOSIS — Z23 Encounter for immunization: Secondary | ICD-10-CM | POA: Diagnosis not present

## 2017-08-22 DIAGNOSIS — D509 Iron deficiency anemia, unspecified: Secondary | ICD-10-CM | POA: Diagnosis not present

## 2017-08-24 DIAGNOSIS — Z23 Encounter for immunization: Secondary | ICD-10-CM | POA: Diagnosis not present

## 2017-08-24 DIAGNOSIS — Z992 Dependence on renal dialysis: Secondary | ICD-10-CM | POA: Diagnosis not present

## 2017-08-24 DIAGNOSIS — N186 End stage renal disease: Secondary | ICD-10-CM | POA: Diagnosis not present

## 2017-08-24 DIAGNOSIS — D631 Anemia in chronic kidney disease: Secondary | ICD-10-CM | POA: Diagnosis not present

## 2017-08-24 DIAGNOSIS — D509 Iron deficiency anemia, unspecified: Secondary | ICD-10-CM | POA: Diagnosis not present

## 2017-08-24 DIAGNOSIS — N2581 Secondary hyperparathyroidism of renal origin: Secondary | ICD-10-CM | POA: Diagnosis not present

## 2017-08-27 DIAGNOSIS — D509 Iron deficiency anemia, unspecified: Secondary | ICD-10-CM | POA: Diagnosis not present

## 2017-08-27 DIAGNOSIS — Z23 Encounter for immunization: Secondary | ICD-10-CM | POA: Diagnosis not present

## 2017-08-27 DIAGNOSIS — N186 End stage renal disease: Secondary | ICD-10-CM | POA: Diagnosis not present

## 2017-08-27 DIAGNOSIS — D631 Anemia in chronic kidney disease: Secondary | ICD-10-CM | POA: Diagnosis not present

## 2017-08-27 DIAGNOSIS — Z992 Dependence on renal dialysis: Secondary | ICD-10-CM | POA: Diagnosis not present

## 2017-08-27 DIAGNOSIS — N2581 Secondary hyperparathyroidism of renal origin: Secondary | ICD-10-CM | POA: Diagnosis not present

## 2017-08-29 DIAGNOSIS — N186 End stage renal disease: Secondary | ICD-10-CM | POA: Diagnosis not present

## 2017-08-29 DIAGNOSIS — D509 Iron deficiency anemia, unspecified: Secondary | ICD-10-CM | POA: Diagnosis not present

## 2017-08-29 DIAGNOSIS — N2581 Secondary hyperparathyroidism of renal origin: Secondary | ICD-10-CM | POA: Diagnosis not present

## 2017-08-29 DIAGNOSIS — Z992 Dependence on renal dialysis: Secondary | ICD-10-CM | POA: Diagnosis not present

## 2017-08-29 DIAGNOSIS — Z23 Encounter for immunization: Secondary | ICD-10-CM | POA: Diagnosis not present

## 2017-08-29 DIAGNOSIS — D631 Anemia in chronic kidney disease: Secondary | ICD-10-CM | POA: Diagnosis not present

## 2017-08-31 DIAGNOSIS — D631 Anemia in chronic kidney disease: Secondary | ICD-10-CM | POA: Diagnosis not present

## 2017-08-31 DIAGNOSIS — Z23 Encounter for immunization: Secondary | ICD-10-CM | POA: Diagnosis not present

## 2017-08-31 DIAGNOSIS — N186 End stage renal disease: Secondary | ICD-10-CM | POA: Diagnosis not present

## 2017-08-31 DIAGNOSIS — Z992 Dependence on renal dialysis: Secondary | ICD-10-CM | POA: Diagnosis not present

## 2017-08-31 DIAGNOSIS — D509 Iron deficiency anemia, unspecified: Secondary | ICD-10-CM | POA: Diagnosis not present

## 2017-08-31 DIAGNOSIS — N2581 Secondary hyperparathyroidism of renal origin: Secondary | ICD-10-CM | POA: Diagnosis not present

## 2017-09-03 DIAGNOSIS — Z992 Dependence on renal dialysis: Secondary | ICD-10-CM | POA: Diagnosis not present

## 2017-09-03 DIAGNOSIS — D631 Anemia in chronic kidney disease: Secondary | ICD-10-CM | POA: Diagnosis not present

## 2017-09-03 DIAGNOSIS — N2581 Secondary hyperparathyroidism of renal origin: Secondary | ICD-10-CM | POA: Diagnosis not present

## 2017-09-03 DIAGNOSIS — N186 End stage renal disease: Secondary | ICD-10-CM | POA: Diagnosis not present

## 2017-09-03 DIAGNOSIS — D509 Iron deficiency anemia, unspecified: Secondary | ICD-10-CM | POA: Diagnosis not present

## 2017-09-03 DIAGNOSIS — Z23 Encounter for immunization: Secondary | ICD-10-CM | POA: Diagnosis not present

## 2017-09-05 DIAGNOSIS — D631 Anemia in chronic kidney disease: Secondary | ICD-10-CM | POA: Diagnosis not present

## 2017-09-05 DIAGNOSIS — D509 Iron deficiency anemia, unspecified: Secondary | ICD-10-CM | POA: Diagnosis not present

## 2017-09-05 DIAGNOSIS — N2581 Secondary hyperparathyroidism of renal origin: Secondary | ICD-10-CM | POA: Diagnosis not present

## 2017-09-05 DIAGNOSIS — N186 End stage renal disease: Secondary | ICD-10-CM | POA: Diagnosis not present

## 2017-09-05 DIAGNOSIS — Z23 Encounter for immunization: Secondary | ICD-10-CM | POA: Diagnosis not present

## 2017-09-05 DIAGNOSIS — Z992 Dependence on renal dialysis: Secondary | ICD-10-CM | POA: Diagnosis not present

## 2017-09-07 DIAGNOSIS — Z992 Dependence on renal dialysis: Secondary | ICD-10-CM | POA: Diagnosis not present

## 2017-09-07 DIAGNOSIS — N2581 Secondary hyperparathyroidism of renal origin: Secondary | ICD-10-CM | POA: Diagnosis not present

## 2017-09-07 DIAGNOSIS — N186 End stage renal disease: Secondary | ICD-10-CM | POA: Diagnosis not present

## 2017-09-07 DIAGNOSIS — D509 Iron deficiency anemia, unspecified: Secondary | ICD-10-CM | POA: Diagnosis not present

## 2017-09-07 DIAGNOSIS — D631 Anemia in chronic kidney disease: Secondary | ICD-10-CM | POA: Diagnosis not present

## 2017-09-07 DIAGNOSIS — Z23 Encounter for immunization: Secondary | ICD-10-CM | POA: Diagnosis not present

## 2017-09-10 DIAGNOSIS — N186 End stage renal disease: Secondary | ICD-10-CM | POA: Diagnosis not present

## 2017-09-10 DIAGNOSIS — Z23 Encounter for immunization: Secondary | ICD-10-CM | POA: Diagnosis not present

## 2017-09-10 DIAGNOSIS — Z992 Dependence on renal dialysis: Secondary | ICD-10-CM | POA: Diagnosis not present

## 2017-09-10 DIAGNOSIS — D509 Iron deficiency anemia, unspecified: Secondary | ICD-10-CM | POA: Diagnosis not present

## 2017-09-10 DIAGNOSIS — D631 Anemia in chronic kidney disease: Secondary | ICD-10-CM | POA: Diagnosis not present

## 2017-09-10 DIAGNOSIS — N2581 Secondary hyperparathyroidism of renal origin: Secondary | ICD-10-CM | POA: Diagnosis not present

## 2017-09-12 DIAGNOSIS — Z23 Encounter for immunization: Secondary | ICD-10-CM | POA: Diagnosis not present

## 2017-09-12 DIAGNOSIS — N2581 Secondary hyperparathyroidism of renal origin: Secondary | ICD-10-CM | POA: Diagnosis not present

## 2017-09-12 DIAGNOSIS — D509 Iron deficiency anemia, unspecified: Secondary | ICD-10-CM | POA: Diagnosis not present

## 2017-09-12 DIAGNOSIS — D631 Anemia in chronic kidney disease: Secondary | ICD-10-CM | POA: Diagnosis not present

## 2017-09-12 DIAGNOSIS — N186 End stage renal disease: Secondary | ICD-10-CM | POA: Diagnosis not present

## 2017-09-12 DIAGNOSIS — Z992 Dependence on renal dialysis: Secondary | ICD-10-CM | POA: Diagnosis not present

## 2017-09-14 DIAGNOSIS — Z992 Dependence on renal dialysis: Secondary | ICD-10-CM | POA: Diagnosis not present

## 2017-09-14 DIAGNOSIS — N186 End stage renal disease: Secondary | ICD-10-CM | POA: Diagnosis not present

## 2017-09-14 DIAGNOSIS — Z23 Encounter for immunization: Secondary | ICD-10-CM | POA: Diagnosis not present

## 2017-09-14 DIAGNOSIS — N2581 Secondary hyperparathyroidism of renal origin: Secondary | ICD-10-CM | POA: Diagnosis not present

## 2017-09-14 DIAGNOSIS — D509 Iron deficiency anemia, unspecified: Secondary | ICD-10-CM | POA: Diagnosis not present

## 2017-09-14 DIAGNOSIS — D631 Anemia in chronic kidney disease: Secondary | ICD-10-CM | POA: Diagnosis not present

## 2017-09-17 DIAGNOSIS — D631 Anemia in chronic kidney disease: Secondary | ICD-10-CM | POA: Diagnosis not present

## 2017-09-17 DIAGNOSIS — Z23 Encounter for immunization: Secondary | ICD-10-CM | POA: Diagnosis not present

## 2017-09-17 DIAGNOSIS — N186 End stage renal disease: Secondary | ICD-10-CM | POA: Diagnosis not present

## 2017-09-17 DIAGNOSIS — N2581 Secondary hyperparathyroidism of renal origin: Secondary | ICD-10-CM | POA: Diagnosis not present

## 2017-09-17 DIAGNOSIS — D509 Iron deficiency anemia, unspecified: Secondary | ICD-10-CM | POA: Diagnosis not present

## 2017-09-17 DIAGNOSIS — Z992 Dependence on renal dialysis: Secondary | ICD-10-CM | POA: Diagnosis not present

## 2017-09-19 DIAGNOSIS — N2581 Secondary hyperparathyroidism of renal origin: Secondary | ICD-10-CM | POA: Diagnosis not present

## 2017-09-19 DIAGNOSIS — N186 End stage renal disease: Secondary | ICD-10-CM | POA: Diagnosis not present

## 2017-09-19 DIAGNOSIS — D631 Anemia in chronic kidney disease: Secondary | ICD-10-CM | POA: Diagnosis not present

## 2017-09-19 DIAGNOSIS — D509 Iron deficiency anemia, unspecified: Secondary | ICD-10-CM | POA: Diagnosis not present

## 2017-09-19 DIAGNOSIS — Z992 Dependence on renal dialysis: Secondary | ICD-10-CM | POA: Diagnosis not present

## 2017-09-20 DIAGNOSIS — E7800 Pure hypercholesterolemia, unspecified: Secondary | ICD-10-CM | POA: Diagnosis not present

## 2017-09-20 DIAGNOSIS — I10 Essential (primary) hypertension: Secondary | ICD-10-CM | POA: Diagnosis not present

## 2017-09-21 DIAGNOSIS — Z992 Dependence on renal dialysis: Secondary | ICD-10-CM | POA: Diagnosis not present

## 2017-09-21 DIAGNOSIS — N186 End stage renal disease: Secondary | ICD-10-CM | POA: Diagnosis not present

## 2017-09-21 DIAGNOSIS — D509 Iron deficiency anemia, unspecified: Secondary | ICD-10-CM | POA: Diagnosis not present

## 2017-09-21 DIAGNOSIS — N2581 Secondary hyperparathyroidism of renal origin: Secondary | ICD-10-CM | POA: Diagnosis not present

## 2017-09-21 DIAGNOSIS — D631 Anemia in chronic kidney disease: Secondary | ICD-10-CM | POA: Diagnosis not present

## 2017-09-24 DIAGNOSIS — D631 Anemia in chronic kidney disease: Secondary | ICD-10-CM | POA: Diagnosis not present

## 2017-09-24 DIAGNOSIS — N186 End stage renal disease: Secondary | ICD-10-CM | POA: Diagnosis not present

## 2017-09-24 DIAGNOSIS — D509 Iron deficiency anemia, unspecified: Secondary | ICD-10-CM | POA: Diagnosis not present

## 2017-09-24 DIAGNOSIS — N2581 Secondary hyperparathyroidism of renal origin: Secondary | ICD-10-CM | POA: Diagnosis not present

## 2017-09-24 DIAGNOSIS — Z992 Dependence on renal dialysis: Secondary | ICD-10-CM | POA: Diagnosis not present

## 2017-09-26 DIAGNOSIS — D509 Iron deficiency anemia, unspecified: Secondary | ICD-10-CM | POA: Diagnosis not present

## 2017-09-26 DIAGNOSIS — Z992 Dependence on renal dialysis: Secondary | ICD-10-CM | POA: Diagnosis not present

## 2017-09-26 DIAGNOSIS — N2581 Secondary hyperparathyroidism of renal origin: Secondary | ICD-10-CM | POA: Diagnosis not present

## 2017-09-26 DIAGNOSIS — D631 Anemia in chronic kidney disease: Secondary | ICD-10-CM | POA: Diagnosis not present

## 2017-09-26 DIAGNOSIS — N186 End stage renal disease: Secondary | ICD-10-CM | POA: Diagnosis not present

## 2017-09-28 DIAGNOSIS — N186 End stage renal disease: Secondary | ICD-10-CM | POA: Diagnosis not present

## 2017-09-28 DIAGNOSIS — D509 Iron deficiency anemia, unspecified: Secondary | ICD-10-CM | POA: Diagnosis not present

## 2017-09-28 DIAGNOSIS — N2581 Secondary hyperparathyroidism of renal origin: Secondary | ICD-10-CM | POA: Diagnosis not present

## 2017-09-28 DIAGNOSIS — Z992 Dependence on renal dialysis: Secondary | ICD-10-CM | POA: Diagnosis not present

## 2017-09-28 DIAGNOSIS — D631 Anemia in chronic kidney disease: Secondary | ICD-10-CM | POA: Diagnosis not present

## 2017-10-01 DIAGNOSIS — D509 Iron deficiency anemia, unspecified: Secondary | ICD-10-CM | POA: Diagnosis not present

## 2017-10-01 DIAGNOSIS — N2581 Secondary hyperparathyroidism of renal origin: Secondary | ICD-10-CM | POA: Diagnosis not present

## 2017-10-01 DIAGNOSIS — D631 Anemia in chronic kidney disease: Secondary | ICD-10-CM | POA: Diagnosis not present

## 2017-10-01 DIAGNOSIS — Z992 Dependence on renal dialysis: Secondary | ICD-10-CM | POA: Diagnosis not present

## 2017-10-01 DIAGNOSIS — N186 End stage renal disease: Secondary | ICD-10-CM | POA: Diagnosis not present

## 2017-10-02 DIAGNOSIS — M79641 Pain in right hand: Secondary | ICD-10-CM | POA: Diagnosis not present

## 2017-10-02 DIAGNOSIS — M79642 Pain in left hand: Secondary | ICD-10-CM | POA: Diagnosis not present

## 2017-10-02 DIAGNOSIS — Z299 Encounter for prophylactic measures, unspecified: Secondary | ICD-10-CM | POA: Diagnosis not present

## 2017-10-02 DIAGNOSIS — I270 Primary pulmonary hypertension: Secondary | ICD-10-CM | POA: Diagnosis not present

## 2017-10-02 DIAGNOSIS — D649 Anemia, unspecified: Secondary | ICD-10-CM | POA: Diagnosis not present

## 2017-10-02 DIAGNOSIS — Z6823 Body mass index (BMI) 23.0-23.9, adult: Secondary | ICD-10-CM | POA: Diagnosis not present

## 2017-10-03 DIAGNOSIS — D509 Iron deficiency anemia, unspecified: Secondary | ICD-10-CM | POA: Diagnosis not present

## 2017-10-03 DIAGNOSIS — N2581 Secondary hyperparathyroidism of renal origin: Secondary | ICD-10-CM | POA: Diagnosis not present

## 2017-10-03 DIAGNOSIS — D631 Anemia in chronic kidney disease: Secondary | ICD-10-CM | POA: Diagnosis not present

## 2017-10-03 DIAGNOSIS — N186 End stage renal disease: Secondary | ICD-10-CM | POA: Diagnosis not present

## 2017-10-03 DIAGNOSIS — Z992 Dependence on renal dialysis: Secondary | ICD-10-CM | POA: Diagnosis not present

## 2017-10-05 DIAGNOSIS — N2581 Secondary hyperparathyroidism of renal origin: Secondary | ICD-10-CM | POA: Diagnosis not present

## 2017-10-05 DIAGNOSIS — D509 Iron deficiency anemia, unspecified: Secondary | ICD-10-CM | POA: Diagnosis not present

## 2017-10-05 DIAGNOSIS — Z992 Dependence on renal dialysis: Secondary | ICD-10-CM | POA: Diagnosis not present

## 2017-10-05 DIAGNOSIS — D631 Anemia in chronic kidney disease: Secondary | ICD-10-CM | POA: Diagnosis not present

## 2017-10-05 DIAGNOSIS — N186 End stage renal disease: Secondary | ICD-10-CM | POA: Diagnosis not present

## 2017-10-08 DIAGNOSIS — N2581 Secondary hyperparathyroidism of renal origin: Secondary | ICD-10-CM | POA: Diagnosis not present

## 2017-10-08 DIAGNOSIS — D631 Anemia in chronic kidney disease: Secondary | ICD-10-CM | POA: Diagnosis not present

## 2017-10-08 DIAGNOSIS — Z992 Dependence on renal dialysis: Secondary | ICD-10-CM | POA: Diagnosis not present

## 2017-10-08 DIAGNOSIS — D509 Iron deficiency anemia, unspecified: Secondary | ICD-10-CM | POA: Diagnosis not present

## 2017-10-08 DIAGNOSIS — N186 End stage renal disease: Secondary | ICD-10-CM | POA: Diagnosis not present

## 2017-10-10 DIAGNOSIS — D509 Iron deficiency anemia, unspecified: Secondary | ICD-10-CM | POA: Diagnosis not present

## 2017-10-10 DIAGNOSIS — N2581 Secondary hyperparathyroidism of renal origin: Secondary | ICD-10-CM | POA: Diagnosis not present

## 2017-10-10 DIAGNOSIS — Z992 Dependence on renal dialysis: Secondary | ICD-10-CM | POA: Diagnosis not present

## 2017-10-10 DIAGNOSIS — N186 End stage renal disease: Secondary | ICD-10-CM | POA: Diagnosis not present

## 2017-10-10 DIAGNOSIS — D631 Anemia in chronic kidney disease: Secondary | ICD-10-CM | POA: Diagnosis not present

## 2017-10-12 DIAGNOSIS — D631 Anemia in chronic kidney disease: Secondary | ICD-10-CM | POA: Diagnosis not present

## 2017-10-12 DIAGNOSIS — N186 End stage renal disease: Secondary | ICD-10-CM | POA: Diagnosis not present

## 2017-10-12 DIAGNOSIS — N2581 Secondary hyperparathyroidism of renal origin: Secondary | ICD-10-CM | POA: Diagnosis not present

## 2017-10-12 DIAGNOSIS — Z992 Dependence on renal dialysis: Secondary | ICD-10-CM | POA: Diagnosis not present

## 2017-10-12 DIAGNOSIS — D509 Iron deficiency anemia, unspecified: Secondary | ICD-10-CM | POA: Diagnosis not present

## 2017-10-15 DIAGNOSIS — Z992 Dependence on renal dialysis: Secondary | ICD-10-CM | POA: Diagnosis not present

## 2017-10-15 DIAGNOSIS — N186 End stage renal disease: Secondary | ICD-10-CM | POA: Diagnosis not present

## 2017-10-15 DIAGNOSIS — D631 Anemia in chronic kidney disease: Secondary | ICD-10-CM | POA: Diagnosis not present

## 2017-10-15 DIAGNOSIS — N2581 Secondary hyperparathyroidism of renal origin: Secondary | ICD-10-CM | POA: Diagnosis not present

## 2017-10-15 DIAGNOSIS — D509 Iron deficiency anemia, unspecified: Secondary | ICD-10-CM | POA: Diagnosis not present

## 2017-10-17 DIAGNOSIS — N186 End stage renal disease: Secondary | ICD-10-CM | POA: Diagnosis not present

## 2017-10-17 DIAGNOSIS — Z992 Dependence on renal dialysis: Secondary | ICD-10-CM | POA: Diagnosis not present

## 2017-10-17 DIAGNOSIS — D509 Iron deficiency anemia, unspecified: Secondary | ICD-10-CM | POA: Diagnosis not present

## 2017-10-17 DIAGNOSIS — D631 Anemia in chronic kidney disease: Secondary | ICD-10-CM | POA: Diagnosis not present

## 2017-10-17 DIAGNOSIS — N2581 Secondary hyperparathyroidism of renal origin: Secondary | ICD-10-CM | POA: Diagnosis not present

## 2017-10-18 DIAGNOSIS — N186 End stage renal disease: Secondary | ICD-10-CM | POA: Diagnosis not present

## 2017-10-18 DIAGNOSIS — Z992 Dependence on renal dialysis: Secondary | ICD-10-CM | POA: Diagnosis not present

## 2017-10-19 DIAGNOSIS — D631 Anemia in chronic kidney disease: Secondary | ICD-10-CM | POA: Diagnosis not present

## 2017-10-19 DIAGNOSIS — Z992 Dependence on renal dialysis: Secondary | ICD-10-CM | POA: Diagnosis not present

## 2017-10-19 DIAGNOSIS — N2581 Secondary hyperparathyroidism of renal origin: Secondary | ICD-10-CM | POA: Diagnosis not present

## 2017-10-19 DIAGNOSIS — N186 End stage renal disease: Secondary | ICD-10-CM | POA: Diagnosis not present

## 2017-10-22 DIAGNOSIS — D631 Anemia in chronic kidney disease: Secondary | ICD-10-CM | POA: Diagnosis not present

## 2017-10-22 DIAGNOSIS — N186 End stage renal disease: Secondary | ICD-10-CM | POA: Diagnosis not present

## 2017-10-22 DIAGNOSIS — N2581 Secondary hyperparathyroidism of renal origin: Secondary | ICD-10-CM | POA: Diagnosis not present

## 2017-10-22 DIAGNOSIS — Z992 Dependence on renal dialysis: Secondary | ICD-10-CM | POA: Diagnosis not present

## 2017-10-24 DIAGNOSIS — N186 End stage renal disease: Secondary | ICD-10-CM | POA: Diagnosis not present

## 2017-10-24 DIAGNOSIS — N2581 Secondary hyperparathyroidism of renal origin: Secondary | ICD-10-CM | POA: Diagnosis not present

## 2017-10-24 DIAGNOSIS — Z992 Dependence on renal dialysis: Secondary | ICD-10-CM | POA: Diagnosis not present

## 2017-10-24 DIAGNOSIS — D631 Anemia in chronic kidney disease: Secondary | ICD-10-CM | POA: Diagnosis not present

## 2017-10-26 DIAGNOSIS — Z992 Dependence on renal dialysis: Secondary | ICD-10-CM | POA: Diagnosis not present

## 2017-10-26 DIAGNOSIS — D631 Anemia in chronic kidney disease: Secondary | ICD-10-CM | POA: Diagnosis not present

## 2017-10-26 DIAGNOSIS — N186 End stage renal disease: Secondary | ICD-10-CM | POA: Diagnosis not present

## 2017-10-26 DIAGNOSIS — N2581 Secondary hyperparathyroidism of renal origin: Secondary | ICD-10-CM | POA: Diagnosis not present

## 2017-10-29 DIAGNOSIS — D631 Anemia in chronic kidney disease: Secondary | ICD-10-CM | POA: Diagnosis not present

## 2017-10-29 DIAGNOSIS — N186 End stage renal disease: Secondary | ICD-10-CM | POA: Diagnosis not present

## 2017-10-29 DIAGNOSIS — Z992 Dependence on renal dialysis: Secondary | ICD-10-CM | POA: Diagnosis not present

## 2017-10-29 DIAGNOSIS — N2581 Secondary hyperparathyroidism of renal origin: Secondary | ICD-10-CM | POA: Diagnosis not present

## 2017-10-31 DIAGNOSIS — Z992 Dependence on renal dialysis: Secondary | ICD-10-CM | POA: Diagnosis not present

## 2017-10-31 DIAGNOSIS — D631 Anemia in chronic kidney disease: Secondary | ICD-10-CM | POA: Diagnosis not present

## 2017-10-31 DIAGNOSIS — N2581 Secondary hyperparathyroidism of renal origin: Secondary | ICD-10-CM | POA: Diagnosis not present

## 2017-10-31 DIAGNOSIS — N186 End stage renal disease: Secondary | ICD-10-CM | POA: Diagnosis not present

## 2017-11-01 ENCOUNTER — Encounter (INDEPENDENT_AMBULATORY_CARE_PROVIDER_SITE_OTHER): Payer: Self-pay | Admitting: Vascular Surgery

## 2017-11-01 ENCOUNTER — Ambulatory Visit (INDEPENDENT_AMBULATORY_CARE_PROVIDER_SITE_OTHER): Payer: PRIVATE HEALTH INSURANCE | Admitting: Vascular Surgery

## 2017-11-01 ENCOUNTER — Ambulatory Visit (INDEPENDENT_AMBULATORY_CARE_PROVIDER_SITE_OTHER): Payer: Medicare Other | Admitting: Vascular Surgery

## 2017-11-01 ENCOUNTER — Ambulatory Visit (INDEPENDENT_AMBULATORY_CARE_PROVIDER_SITE_OTHER): Payer: Medicare Other

## 2017-11-01 ENCOUNTER — Encounter (INDEPENDENT_AMBULATORY_CARE_PROVIDER_SITE_OTHER): Payer: Medicare Other

## 2017-11-01 VITALS — BP 136/64 | HR 80 | Resp 14 | Ht 64.0 in | Wt 134.0 lb

## 2017-11-01 DIAGNOSIS — Z992 Dependence on renal dialysis: Secondary | ICD-10-CM

## 2017-11-01 DIAGNOSIS — I10 Essential (primary) hypertension: Secondary | ICD-10-CM

## 2017-11-01 DIAGNOSIS — N186 End stage renal disease: Secondary | ICD-10-CM

## 2017-11-01 DIAGNOSIS — E782 Mixed hyperlipidemia: Secondary | ICD-10-CM

## 2017-11-01 NOTE — Progress Notes (Signed)
MRN : [id number]  [name] is a 65 y.o. (02-Apr-1951) female who presents with chief complaint of  Chief Complaint  Patient presents with  . Follow-up    6 month follow up  .  History of Present Illness: Patient returns today in follow up of her dialysis access.  Has been working well for her dialysis needs.  It would appear that there is taking the same 2 areas for her arterial and venous access sites, and they have become significantly aneurysmal.  Her duplex today shows only aneurysms in these 2 access sites with an otherwise patent right thigh AV graft without significant stenosis.  Current Outpatient Medications  Medication Sig Dispense Refill  . acetaminophen (TYLENOL) 500 MG tablet Take 1,000 mg by mouth every 6 (six) hours as needed for pain (headache).    Marland Kitchen aspirin 81 MG tablet Take 81 mg by mouth daily.    . B Complex-C-Zn-Folic Acid (NEPHPLEX RX PO) Take 1 tablet by mouth daily.    . cinacalcet (SENSIPAR) 30 MG tablet Take 30 mg by mouth daily after supper.    . gabapentin (NEURONTIN) 300 MG capsule     . isosorbide mononitrate (IMDUR) 30 MG 24 hr tablet Take 30 mg by mouth daily.    Marland Kitchen labetalol (NORMODYNE) 300 MG tablet Take 300 mg by mouth 2 (two) times daily.    . minoxidil (LONITEN) 2.5 MG tablet Take 2.5 mg by mouth 2 (two) times daily.    Marland Kitchen omeprazole (PRILOSEC) 40 MG capsule     . sevelamer (RENAGEL) 800 MG tablet Take 800-1,600 mg by mouth See admin instructions. Take 2 tablets by mouth with meals and 1 with snacks.    . simvastatin (ZOCOR) 20 MG tablet Take 20 mg by mouth daily.    Marland Kitchen HYDROcodone-acetaminophen (NORCO/VICODIN) 5-325 MG tablet      No current facility-administered medications for this visit.     Past Medical History:  Diagnosis Date  . Chronic systolic heart failure ([place])   . Coronary atherosclerosis of native coronary artery   . Diseases of tricuspid valve   . End stage renal disease ([place])   . Mitral valve insufficiency and aortic valve  insufficiency   . Other and unspecified hyperlipidemia   . Secondary cardiomyopathy, unspecified   . Unspecified essential hypertension     Past Surgical History:  Procedure Laterality Date  . AV FISTULA PLACEMENT Right 2012  . AV FISTULA PLACEMENT Left 2004  . INSERTION OF DIALYSIS CATHETER N/A 01/08/2013   Procedure: INSERTION OF DIALYSIS CATHETER;  Surgeon: Angelia Mould, MD;  Location: [place];  Service: Vascular;  Laterality: N/A;  . KIDNEY TRANSPLANT Left 2002   pt. states transplant lasted 2 yrs.  Marland Kitchen PERIPHERAL VASCULAR CATHETERIZATION Right 08/27/2016   Procedure: A/V Shuntogram/Fistulagram;  Surgeon: Algernon Huxley, MD;  Location: [place] [facility];  Service: Cardiovascular;  Laterality: Right;    Social History Social History   Tobacco Use  . Smoking status: Never Smoker  . Smokeless tobacco: Never Used  Substance Use Topics  . Alcohol use: No  . Drug use: No     Family History No bleeding or clotting disorders  Allergies  Allergen Reactions  . Dilaudid [Hydromorphone] Nausea And Vomiting  . Tape Rash    Plastic tape     REVIEW OF SYSTEMS (Negative unless checked)  Constitutional: [] Weight loss  [] Fever  [] Chills Cardiac: [] Chest pain   [] Chest pressure   [x] Palpitations   [] Shortness of breath  when laying flat   [] Shortness of breath at rest   [x] Shortness of breath with exertion. Vascular:  [] Pain in legs with walking   [] Pain in legs at rest   [] Pain in legs when laying flat   [] Claudication   [] Pain in feet when walking  [] Pain in feet at rest  [] Pain in feet when laying flat   [] History of DVT   [] Phlebitis   [] Swelling in legs   [] Varicose veins   [] Non-healing ulcers Pulmonary:   [] Uses home oxygen   [] Productive cough   [] Hemoptysis   [] Wheeze  [] COPD   [] Asthma Neurologic:  [] Dizziness  [] Blackouts   [] Seizures   [] History of stroke   [] History of TIA  [] Aphasia   [] Temporary blindness   [] Dysphagia   [] Weakness or numbness in arms   [] Weakness or  numbness in legs Musculoskeletal:  [x] Arthritis   [] Joint swelling   [] Joint pain   [] Low back pain Hematologic:  [] Easy bruising  [] Easy bleeding   [] Hypercoagulable state   [x] Anemic   Gastrointestinal:  [] Blood in stool   [] Vomiting blood  [x] Gastroesophageal reflux/heartburn   [] Abdominal pain Genitourinary:  [x] Chronic kidney disease   [] Difficult urination  [] Frequent urination  [] Burning with urination   [] Hematuria Skin:  [] Rashes   [] Ulcers   [] Wounds Psychological:  [] History of anxiety   []  History of major depression.  Physical Examination  BP 136/64 (BP Location: Left Arm, Patient Position: Sitting)   Pulse 80   Resp 14   Ht 5\' 4"  (1.626 m)   Wt 60.8 kg (134 lb)   BMI 23.00 kg/m  Gen:  WD/WN, NAD Head: [place]/AT, No temporalis wasting. Ear/Nose/Throat: Hearing grossly intact, nares w/o erythema or drainage, trachea midline Eyes: Conjunctiva clear. Sclera non-icteric Neck: Supple.  No JVD.  Pulmonary:  Good air movement, no use of accessory muscles.  Cardiac: RRR Vascular: Good thrill is present in right thigh AV graft.  Aneurysmal access sites without bleeding. Vessel Right Left  Radial Palpable Palpable                                    Musculoskeletal: M/S 5/5 throughout.  No deformity or atrophy Neurologic: Sensation grossly intact in extremities.  Symmetrical.  Speech is fluent.  Psychiatric: Judgment intact, Mood & affect appropriate for pt's clinical situation. Dermatologic: No rashes or ulcers noted.  No cellulitis or open wounds.       Labs No results found for this or any previous visit (from the past 2160 hour(s)).  Radiology No results found.   Assessment/Plan  Essential hypertension blood pressure control important in reducing the progression of atherosclerotic disease. On appropriate oral medications.   Hyperlipidemia lipid control important in reducing the progression of atherosclerotic disease. Continue statin therapy   ESRD on  dialysis [facility]) Her duplex today shows only aneurysms in these 2 access sites with an otherwise patent right thigh AV graft without significant stenosis. No intervention is currently required.  We will continue to monitor this with duplex given the now 2 cm aneurysm of her access sites.  It would be very helpful if the access sites could be rotated and other areas could be used for access.    Leotis Pain, MD  11/01/2017 4:00 PM    This note was created with Dragon medical transcription system.  Any errors from dictation are purely unintentional

## 2017-11-01 NOTE — Assessment & Plan Note (Signed)
Her duplex today shows only aneurysms in these 2 access sites with an otherwise patent right thigh AV graft without significant stenosis. No intervention is currently required.  We will continue to monitor this with duplex given the now 2 cm aneurysm of her access sites.  It would be very helpful if the access sites could be rotated and other areas could be used for access.

## 2017-11-01 NOTE — Patient Instructions (Signed)
Vascular Access for Hemodialysis A vascular access is a connection between two blood vessels that allows blood to be easily removed from the body and returned to the body during hemodialysis. Hemodialysis is a procedure in which a machine outside of the body filters the blood. There are three types of vascular accesses:  Arteriovenous fistula. This is a connection between an artery and a vein (usually in the arm) that is made by sewing them together. Blood in the artery flows directly into the vein, causing it to get larger over time. This makes it easier for the vein to be used for hemodialysis. An arteriovenous fistula takes 1-6 months to develop after surgery.  Arteriovenous graft. This is a connection between an artery and a vein in the arm that is made with a tube. An arteriovenous graft can be used within 2-3 weeks of surgery.  Venous catheter. This is a thin, flexible tube that is placed in a large vein (usually in the neck, chest, or groin). A venous catheter for hemodialysis contains two tubes that come out of the skin. A venous catheter can be used right away. It is usually used as a temporary access if you need hemodialysis before a fistula or graft has developed. It may also be used as a permanent access if a fistula or graft cannot be created.  Which type of access is best for me? The type of access that is best for you depends on the size and strength of your veins. A fistula is usually the preferred type of access. It can last several years and is less likely than the other types of accesses to become infected or to cause blood clots within a blood vessel (thrombosis). However, a fistula is not an option for everyone. If your veins are not the right size, a graft may be used instead. Grafts require you to have strong veins. If your veins are not strong enough for a graft, a catheter may be used. Catheters are more likely than fistulas and grafts to become infected or to have  thrombosis. Sometimes, only one type of access is an option. Your health care provider will help you determine which type of access is best for you. How is a vascular access used? The way the access is used depends on the type of access:  If the access is a fistula or graft, two needles are inserted through the skin into the access before each hemodialysis session. Blood leaves the body through one of the needles and travels through a tube to the hemodialysis machine (dialyzer). It then flows through another tube and returns to the body through the second needle.  If the access is a catheter, one tube is connected directly to the tube that leads to the dialyzer and the other is connected to a tube that leads away from the dialyzer. Blood leaves the body through one tube and returns to the body through the other.  What kind of problems can occur with vascular accesses?  Blood clots within a blood vessel (thrombosis). Thrombosis can lead to a narrowing of a blood vessel or tube (stenosis). If thrombosis occurs frequently, another access site may be created as a backup.  Infection. These problems are most likely to occur with a venous catheter and least likely to occur with an arteriovenous fistula. How do I care for my vascular access? Wear a medical alert bracelet. This tells health care providers that you are a dialysis patient in the case of an emergency and   allows them to care for your veins appropriately. If you have a graft or fistula:  A "bruit" is a noise that is heard with a stethoscope and a "thrill" is a vibration felt over the graft or fistula. The presence of the bruit and thrill indicates that the access is working. You will be taught to feel for the thrill each day. If this is not felt, the access may be clotted. Call your health care provider.  You may use the arm where your vascular access is located freely after the site heals. Keep the following in mind: ? Avoid pressure on the  arm. ? Avoid lifting heavy objects with the arm. ? Avoid sleeping on the arm. ? Avoid wearing tight-sleeved shirts or jewelry around the graft or fistula.  Do not allow blood pressure monitoring or needle punctures on the side where the graft or fistula is located.  With permission from your health care provider, you may do exercises to help with blood flow through a fistula. These exercises involve squeezing a rubber ball or other soft objects as instructed.  Contact a health care provider if:  Chills develop.  You have an oral temperature above 102 F (38.9 C).  Swelling around the graft or fistula gets worse.  New pain develops.  Pus or other fluid (drainage) is seen at the vascular access site.  Skin redness or red streaking is seen on the skin around, above, or below the vascular access. Get help right away if:  Pain, numbness, or an unusual pale skin color develops in the hand on the side of your fistula.  Dizziness or weakness develops that you have not had before.  The vascular access has bleeding that cannot be easily controlled. This information is not intended to replace advice given to you by your health care provider. Make sure you discuss any questions you have with your health care provider. Document Released: 01/26/2003 Document Revised: 04/12/2016 Document Reviewed: 03/24/2013 Elsevier Interactive Patient Education  2017 [employer]  

## 2017-11-01 NOTE — Assessment & Plan Note (Signed)
lipid control important in reducing the progression of atherosclerotic disease. Continue statin therapy  

## 2017-11-01 NOTE — Assessment & Plan Note (Signed)
blood pressure control important in reducing the progression of atherosclerotic disease. On appropriate oral medications.  

## 2017-11-02 DIAGNOSIS — D631 Anemia in chronic kidney disease: Secondary | ICD-10-CM | POA: Diagnosis not present

## 2017-11-02 DIAGNOSIS — N186 End stage renal disease: Secondary | ICD-10-CM | POA: Diagnosis not present

## 2017-11-02 DIAGNOSIS — N2581 Secondary hyperparathyroidism of renal origin: Secondary | ICD-10-CM | POA: Diagnosis not present

## 2017-11-02 DIAGNOSIS — Z992 Dependence on renal dialysis: Secondary | ICD-10-CM | POA: Diagnosis not present

## 2017-11-05 DIAGNOSIS — N186 End stage renal disease: Secondary | ICD-10-CM | POA: Diagnosis not present

## 2017-11-05 DIAGNOSIS — D631 Anemia in chronic kidney disease: Secondary | ICD-10-CM | POA: Diagnosis not present

## 2017-11-05 DIAGNOSIS — N2581 Secondary hyperparathyroidism of renal origin: Secondary | ICD-10-CM | POA: Diagnosis not present

## 2017-11-05 DIAGNOSIS — Z992 Dependence on renal dialysis: Secondary | ICD-10-CM | POA: Diagnosis not present

## 2017-11-07 DIAGNOSIS — N2581 Secondary hyperparathyroidism of renal origin: Secondary | ICD-10-CM | POA: Diagnosis not present

## 2017-11-07 DIAGNOSIS — Z992 Dependence on renal dialysis: Secondary | ICD-10-CM | POA: Diagnosis not present

## 2017-11-07 DIAGNOSIS — D631 Anemia in chronic kidney disease: Secondary | ICD-10-CM | POA: Diagnosis not present

## 2017-11-07 DIAGNOSIS — N186 End stage renal disease: Secondary | ICD-10-CM | POA: Diagnosis not present

## 2017-11-09 DIAGNOSIS — N2581 Secondary hyperparathyroidism of renal origin: Secondary | ICD-10-CM | POA: Diagnosis not present

## 2017-11-09 DIAGNOSIS — Z992 Dependence on renal dialysis: Secondary | ICD-10-CM | POA: Diagnosis not present

## 2017-11-09 DIAGNOSIS — D631 Anemia in chronic kidney disease: Secondary | ICD-10-CM | POA: Diagnosis not present

## 2017-11-09 DIAGNOSIS — N186 End stage renal disease: Secondary | ICD-10-CM | POA: Diagnosis not present

## 2017-11-11 DIAGNOSIS — D631 Anemia in chronic kidney disease: Secondary | ICD-10-CM | POA: Diagnosis not present

## 2017-11-11 DIAGNOSIS — N186 End stage renal disease: Secondary | ICD-10-CM | POA: Diagnosis not present

## 2017-11-11 DIAGNOSIS — N2581 Secondary hyperparathyroidism of renal origin: Secondary | ICD-10-CM | POA: Diagnosis not present

## 2017-11-11 DIAGNOSIS — Z992 Dependence on renal dialysis: Secondary | ICD-10-CM | POA: Diagnosis not present

## 2017-11-14 DIAGNOSIS — Z992 Dependence on renal dialysis: Secondary | ICD-10-CM | POA: Diagnosis not present

## 2017-11-14 DIAGNOSIS — D631 Anemia in chronic kidney disease: Secondary | ICD-10-CM | POA: Diagnosis not present

## 2017-11-14 DIAGNOSIS — N2581 Secondary hyperparathyroidism of renal origin: Secondary | ICD-10-CM | POA: Diagnosis not present

## 2017-11-14 DIAGNOSIS — N186 End stage renal disease: Secondary | ICD-10-CM | POA: Diagnosis not present

## 2017-11-16 DIAGNOSIS — Z992 Dependence on renal dialysis: Secondary | ICD-10-CM | POA: Diagnosis not present

## 2017-11-16 DIAGNOSIS — N186 End stage renal disease: Secondary | ICD-10-CM | POA: Diagnosis not present

## 2017-11-16 DIAGNOSIS — N2581 Secondary hyperparathyroidism of renal origin: Secondary | ICD-10-CM | POA: Diagnosis not present

## 2017-11-16 DIAGNOSIS — D631 Anemia in chronic kidney disease: Secondary | ICD-10-CM | POA: Diagnosis not present

## 2017-11-18 DIAGNOSIS — D631 Anemia in chronic kidney disease: Secondary | ICD-10-CM | POA: Diagnosis not present

## 2017-11-18 DIAGNOSIS — N2581 Secondary hyperparathyroidism of renal origin: Secondary | ICD-10-CM | POA: Diagnosis not present

## 2017-11-18 DIAGNOSIS — Z992 Dependence on renal dialysis: Secondary | ICD-10-CM | POA: Diagnosis not present

## 2017-11-18 DIAGNOSIS — N186 End stage renal disease: Secondary | ICD-10-CM | POA: Diagnosis not present

## 2017-11-21 DIAGNOSIS — Z992 Dependence on renal dialysis: Secondary | ICD-10-CM | POA: Diagnosis not present

## 2017-11-21 DIAGNOSIS — D509 Iron deficiency anemia, unspecified: Secondary | ICD-10-CM | POA: Diagnosis not present

## 2017-11-21 DIAGNOSIS — D631 Anemia in chronic kidney disease: Secondary | ICD-10-CM | POA: Diagnosis not present

## 2017-11-21 DIAGNOSIS — N2581 Secondary hyperparathyroidism of renal origin: Secondary | ICD-10-CM | POA: Diagnosis not present

## 2017-11-21 DIAGNOSIS — N186 End stage renal disease: Secondary | ICD-10-CM | POA: Diagnosis not present

## 2017-11-23 DIAGNOSIS — Z992 Dependence on renal dialysis: Secondary | ICD-10-CM | POA: Diagnosis not present

## 2017-11-23 DIAGNOSIS — N186 End stage renal disease: Secondary | ICD-10-CM | POA: Diagnosis not present

## 2017-11-23 DIAGNOSIS — D631 Anemia in chronic kidney disease: Secondary | ICD-10-CM | POA: Diagnosis not present

## 2017-11-23 DIAGNOSIS — N2581 Secondary hyperparathyroidism of renal origin: Secondary | ICD-10-CM | POA: Diagnosis not present

## 2017-11-23 DIAGNOSIS — D509 Iron deficiency anemia, unspecified: Secondary | ICD-10-CM | POA: Diagnosis not present

## 2017-11-26 DIAGNOSIS — N2581 Secondary hyperparathyroidism of renal origin: Secondary | ICD-10-CM | POA: Diagnosis not present

## 2017-11-26 DIAGNOSIS — Z992 Dependence on renal dialysis: Secondary | ICD-10-CM | POA: Diagnosis not present

## 2017-11-26 DIAGNOSIS — D631 Anemia in chronic kidney disease: Secondary | ICD-10-CM | POA: Diagnosis not present

## 2017-11-26 DIAGNOSIS — D509 Iron deficiency anemia, unspecified: Secondary | ICD-10-CM | POA: Diagnosis not present

## 2017-11-26 DIAGNOSIS — N186 End stage renal disease: Secondary | ICD-10-CM | POA: Diagnosis not present

## 2017-11-28 DIAGNOSIS — N186 End stage renal disease: Secondary | ICD-10-CM | POA: Diagnosis not present

## 2017-11-28 DIAGNOSIS — N2581 Secondary hyperparathyroidism of renal origin: Secondary | ICD-10-CM | POA: Diagnosis not present

## 2017-11-28 DIAGNOSIS — D631 Anemia in chronic kidney disease: Secondary | ICD-10-CM | POA: Diagnosis not present

## 2017-11-28 DIAGNOSIS — D509 Iron deficiency anemia, unspecified: Secondary | ICD-10-CM | POA: Diagnosis not present

## 2017-11-28 DIAGNOSIS — Z992 Dependence on renal dialysis: Secondary | ICD-10-CM | POA: Diagnosis not present

## 2017-11-30 DIAGNOSIS — D631 Anemia in chronic kidney disease: Secondary | ICD-10-CM | POA: Diagnosis not present

## 2017-11-30 DIAGNOSIS — N186 End stage renal disease: Secondary | ICD-10-CM | POA: Diagnosis not present

## 2017-11-30 DIAGNOSIS — D509 Iron deficiency anemia, unspecified: Secondary | ICD-10-CM | POA: Diagnosis not present

## 2017-11-30 DIAGNOSIS — Z992 Dependence on renal dialysis: Secondary | ICD-10-CM | POA: Diagnosis not present

## 2017-11-30 DIAGNOSIS — N2581 Secondary hyperparathyroidism of renal origin: Secondary | ICD-10-CM | POA: Diagnosis not present

## 2017-12-03 DIAGNOSIS — N2581 Secondary hyperparathyroidism of renal origin: Secondary | ICD-10-CM | POA: Diagnosis not present

## 2017-12-03 DIAGNOSIS — D631 Anemia in chronic kidney disease: Secondary | ICD-10-CM | POA: Diagnosis not present

## 2017-12-03 DIAGNOSIS — Z992 Dependence on renal dialysis: Secondary | ICD-10-CM | POA: Diagnosis not present

## 2017-12-03 DIAGNOSIS — N186 End stage renal disease: Secondary | ICD-10-CM | POA: Diagnosis not present

## 2017-12-03 DIAGNOSIS — D509 Iron deficiency anemia, unspecified: Secondary | ICD-10-CM | POA: Diagnosis not present

## 2017-12-05 DIAGNOSIS — D631 Anemia in chronic kidney disease: Secondary | ICD-10-CM | POA: Diagnosis not present

## 2017-12-05 DIAGNOSIS — N2581 Secondary hyperparathyroidism of renal origin: Secondary | ICD-10-CM | POA: Diagnosis not present

## 2017-12-05 DIAGNOSIS — D509 Iron deficiency anemia, unspecified: Secondary | ICD-10-CM | POA: Diagnosis not present

## 2017-12-05 DIAGNOSIS — N186 End stage renal disease: Secondary | ICD-10-CM | POA: Diagnosis not present

## 2017-12-05 DIAGNOSIS — Z992 Dependence on renal dialysis: Secondary | ICD-10-CM | POA: Diagnosis not present

## 2017-12-07 DIAGNOSIS — D631 Anemia in chronic kidney disease: Secondary | ICD-10-CM | POA: Diagnosis not present

## 2017-12-07 DIAGNOSIS — D509 Iron deficiency anemia, unspecified: Secondary | ICD-10-CM | POA: Diagnosis not present

## 2017-12-07 DIAGNOSIS — N2581 Secondary hyperparathyroidism of renal origin: Secondary | ICD-10-CM | POA: Diagnosis not present

## 2017-12-07 DIAGNOSIS — N186 End stage renal disease: Secondary | ICD-10-CM | POA: Diagnosis not present

## 2017-12-07 DIAGNOSIS — Z992 Dependence on renal dialysis: Secondary | ICD-10-CM | POA: Diagnosis not present

## 2017-12-10 DIAGNOSIS — N2581 Secondary hyperparathyroidism of renal origin: Secondary | ICD-10-CM | POA: Diagnosis not present

## 2017-12-10 DIAGNOSIS — N186 End stage renal disease: Secondary | ICD-10-CM | POA: Diagnosis not present

## 2017-12-10 DIAGNOSIS — D631 Anemia in chronic kidney disease: Secondary | ICD-10-CM | POA: Diagnosis not present

## 2017-12-10 DIAGNOSIS — Z992 Dependence on renal dialysis: Secondary | ICD-10-CM | POA: Diagnosis not present

## 2017-12-10 DIAGNOSIS — D509 Iron deficiency anemia, unspecified: Secondary | ICD-10-CM | POA: Diagnosis not present

## 2017-12-12 DIAGNOSIS — N186 End stage renal disease: Secondary | ICD-10-CM | POA: Diagnosis not present

## 2017-12-12 DIAGNOSIS — D631 Anemia in chronic kidney disease: Secondary | ICD-10-CM | POA: Diagnosis not present

## 2017-12-12 DIAGNOSIS — D509 Iron deficiency anemia, unspecified: Secondary | ICD-10-CM | POA: Diagnosis not present

## 2017-12-12 DIAGNOSIS — N2581 Secondary hyperparathyroidism of renal origin: Secondary | ICD-10-CM | POA: Diagnosis not present

## 2017-12-12 DIAGNOSIS — Z992 Dependence on renal dialysis: Secondary | ICD-10-CM | POA: Diagnosis not present

## 2017-12-14 DIAGNOSIS — D631 Anemia in chronic kidney disease: Secondary | ICD-10-CM | POA: Diagnosis not present

## 2017-12-14 DIAGNOSIS — D509 Iron deficiency anemia, unspecified: Secondary | ICD-10-CM | POA: Diagnosis not present

## 2017-12-14 DIAGNOSIS — Z992 Dependence on renal dialysis: Secondary | ICD-10-CM | POA: Diagnosis not present

## 2017-12-14 DIAGNOSIS — N186 End stage renal disease: Secondary | ICD-10-CM | POA: Diagnosis not present

## 2017-12-14 DIAGNOSIS — N2581 Secondary hyperparathyroidism of renal origin: Secondary | ICD-10-CM | POA: Diagnosis not present

## 2017-12-17 DIAGNOSIS — D631 Anemia in chronic kidney disease: Secondary | ICD-10-CM | POA: Diagnosis not present

## 2017-12-17 DIAGNOSIS — Z992 Dependence on renal dialysis: Secondary | ICD-10-CM | POA: Diagnosis not present

## 2017-12-17 DIAGNOSIS — N186 End stage renal disease: Secondary | ICD-10-CM | POA: Diagnosis not present

## 2017-12-17 DIAGNOSIS — D509 Iron deficiency anemia, unspecified: Secondary | ICD-10-CM | POA: Diagnosis not present

## 2017-12-17 DIAGNOSIS — N2581 Secondary hyperparathyroidism of renal origin: Secondary | ICD-10-CM | POA: Diagnosis not present

## 2017-12-19 DIAGNOSIS — N186 End stage renal disease: Secondary | ICD-10-CM | POA: Diagnosis not present

## 2017-12-19 DIAGNOSIS — E7800 Pure hypercholesterolemia, unspecified: Secondary | ICD-10-CM | POA: Diagnosis not present

## 2017-12-19 DIAGNOSIS — Z992 Dependence on renal dialysis: Secondary | ICD-10-CM | POA: Diagnosis not present

## 2017-12-19 DIAGNOSIS — N2581 Secondary hyperparathyroidism of renal origin: Secondary | ICD-10-CM | POA: Diagnosis not present

## 2017-12-19 DIAGNOSIS — D509 Iron deficiency anemia, unspecified: Secondary | ICD-10-CM | POA: Diagnosis not present

## 2017-12-19 DIAGNOSIS — I10 Essential (primary) hypertension: Secondary | ICD-10-CM | POA: Diagnosis not present

## 2017-12-19 DIAGNOSIS — D631 Anemia in chronic kidney disease: Secondary | ICD-10-CM | POA: Diagnosis not present

## 2017-12-21 DIAGNOSIS — N2581 Secondary hyperparathyroidism of renal origin: Secondary | ICD-10-CM | POA: Diagnosis not present

## 2017-12-21 DIAGNOSIS — D509 Iron deficiency anemia, unspecified: Secondary | ICD-10-CM | POA: Diagnosis not present

## 2017-12-21 DIAGNOSIS — N186 End stage renal disease: Secondary | ICD-10-CM | POA: Diagnosis not present

## 2017-12-21 DIAGNOSIS — D631 Anemia in chronic kidney disease: Secondary | ICD-10-CM | POA: Diagnosis not present

## 2017-12-21 DIAGNOSIS — Z992 Dependence on renal dialysis: Secondary | ICD-10-CM | POA: Diagnosis not present

## 2017-12-24 DIAGNOSIS — Z992 Dependence on renal dialysis: Secondary | ICD-10-CM | POA: Diagnosis not present

## 2017-12-24 DIAGNOSIS — D509 Iron deficiency anemia, unspecified: Secondary | ICD-10-CM | POA: Diagnosis not present

## 2017-12-24 DIAGNOSIS — N186 End stage renal disease: Secondary | ICD-10-CM | POA: Diagnosis not present

## 2017-12-24 DIAGNOSIS — N2581 Secondary hyperparathyroidism of renal origin: Secondary | ICD-10-CM | POA: Diagnosis not present

## 2017-12-24 DIAGNOSIS — D631 Anemia in chronic kidney disease: Secondary | ICD-10-CM | POA: Diagnosis not present

## 2017-12-26 DIAGNOSIS — Z992 Dependence on renal dialysis: Secondary | ICD-10-CM | POA: Diagnosis not present

## 2017-12-26 DIAGNOSIS — N186 End stage renal disease: Secondary | ICD-10-CM | POA: Diagnosis not present

## 2017-12-26 DIAGNOSIS — D509 Iron deficiency anemia, unspecified: Secondary | ICD-10-CM | POA: Diagnosis not present

## 2017-12-26 DIAGNOSIS — D631 Anemia in chronic kidney disease: Secondary | ICD-10-CM | POA: Diagnosis not present

## 2017-12-26 DIAGNOSIS — N2581 Secondary hyperparathyroidism of renal origin: Secondary | ICD-10-CM | POA: Diagnosis not present

## 2017-12-28 DIAGNOSIS — N2581 Secondary hyperparathyroidism of renal origin: Secondary | ICD-10-CM | POA: Diagnosis not present

## 2017-12-28 DIAGNOSIS — D509 Iron deficiency anemia, unspecified: Secondary | ICD-10-CM | POA: Diagnosis not present

## 2017-12-28 DIAGNOSIS — Z992 Dependence on renal dialysis: Secondary | ICD-10-CM | POA: Diagnosis not present

## 2017-12-28 DIAGNOSIS — D631 Anemia in chronic kidney disease: Secondary | ICD-10-CM | POA: Diagnosis not present

## 2017-12-28 DIAGNOSIS — N186 End stage renal disease: Secondary | ICD-10-CM | POA: Diagnosis not present

## 2017-12-31 DIAGNOSIS — D509 Iron deficiency anemia, unspecified: Secondary | ICD-10-CM | POA: Diagnosis not present

## 2017-12-31 DIAGNOSIS — N2581 Secondary hyperparathyroidism of renal origin: Secondary | ICD-10-CM | POA: Diagnosis not present

## 2017-12-31 DIAGNOSIS — N186 End stage renal disease: Secondary | ICD-10-CM | POA: Diagnosis not present

## 2017-12-31 DIAGNOSIS — D631 Anemia in chronic kidney disease: Secondary | ICD-10-CM | POA: Diagnosis not present

## 2017-12-31 DIAGNOSIS — Z992 Dependence on renal dialysis: Secondary | ICD-10-CM | POA: Diagnosis not present

## 2018-01-02 DIAGNOSIS — N186 End stage renal disease: Secondary | ICD-10-CM | POA: Diagnosis not present

## 2018-01-02 DIAGNOSIS — N2581 Secondary hyperparathyroidism of renal origin: Secondary | ICD-10-CM | POA: Diagnosis not present

## 2018-01-02 DIAGNOSIS — Z992 Dependence on renal dialysis: Secondary | ICD-10-CM | POA: Diagnosis not present

## 2018-01-02 DIAGNOSIS — D509 Iron deficiency anemia, unspecified: Secondary | ICD-10-CM | POA: Diagnosis not present

## 2018-01-02 DIAGNOSIS — D631 Anemia in chronic kidney disease: Secondary | ICD-10-CM | POA: Diagnosis not present

## 2018-01-04 DIAGNOSIS — N186 End stage renal disease: Secondary | ICD-10-CM | POA: Diagnosis not present

## 2018-01-04 DIAGNOSIS — D631 Anemia in chronic kidney disease: Secondary | ICD-10-CM | POA: Diagnosis not present

## 2018-01-04 DIAGNOSIS — N2581 Secondary hyperparathyroidism of renal origin: Secondary | ICD-10-CM | POA: Diagnosis not present

## 2018-01-04 DIAGNOSIS — D509 Iron deficiency anemia, unspecified: Secondary | ICD-10-CM | POA: Diagnosis not present

## 2018-01-04 DIAGNOSIS — Z992 Dependence on renal dialysis: Secondary | ICD-10-CM | POA: Diagnosis not present

## 2018-01-07 DIAGNOSIS — D631 Anemia in chronic kidney disease: Secondary | ICD-10-CM | POA: Diagnosis not present

## 2018-01-07 DIAGNOSIS — N186 End stage renal disease: Secondary | ICD-10-CM | POA: Diagnosis not present

## 2018-01-07 DIAGNOSIS — D509 Iron deficiency anemia, unspecified: Secondary | ICD-10-CM | POA: Diagnosis not present

## 2018-01-07 DIAGNOSIS — N2581 Secondary hyperparathyroidism of renal origin: Secondary | ICD-10-CM | POA: Diagnosis not present

## 2018-01-07 DIAGNOSIS — Z992 Dependence on renal dialysis: Secondary | ICD-10-CM | POA: Diagnosis not present

## 2018-01-09 DIAGNOSIS — N186 End stage renal disease: Secondary | ICD-10-CM | POA: Diagnosis not present

## 2018-01-09 DIAGNOSIS — N2581 Secondary hyperparathyroidism of renal origin: Secondary | ICD-10-CM | POA: Diagnosis not present

## 2018-01-09 DIAGNOSIS — D631 Anemia in chronic kidney disease: Secondary | ICD-10-CM | POA: Diagnosis not present

## 2018-01-09 DIAGNOSIS — Z992 Dependence on renal dialysis: Secondary | ICD-10-CM | POA: Diagnosis not present

## 2018-01-09 DIAGNOSIS — D509 Iron deficiency anemia, unspecified: Secondary | ICD-10-CM | POA: Diagnosis not present

## 2018-01-11 DIAGNOSIS — Z992 Dependence on renal dialysis: Secondary | ICD-10-CM | POA: Diagnosis not present

## 2018-01-11 DIAGNOSIS — D631 Anemia in chronic kidney disease: Secondary | ICD-10-CM | POA: Diagnosis not present

## 2018-01-11 DIAGNOSIS — D509 Iron deficiency anemia, unspecified: Secondary | ICD-10-CM | POA: Diagnosis not present

## 2018-01-11 DIAGNOSIS — N186 End stage renal disease: Secondary | ICD-10-CM | POA: Diagnosis not present

## 2018-01-11 DIAGNOSIS — N2581 Secondary hyperparathyroidism of renal origin: Secondary | ICD-10-CM | POA: Diagnosis not present

## 2018-01-14 DIAGNOSIS — D631 Anemia in chronic kidney disease: Secondary | ICD-10-CM | POA: Diagnosis not present

## 2018-01-14 DIAGNOSIS — D509 Iron deficiency anemia, unspecified: Secondary | ICD-10-CM | POA: Diagnosis not present

## 2018-01-14 DIAGNOSIS — N2581 Secondary hyperparathyroidism of renal origin: Secondary | ICD-10-CM | POA: Diagnosis not present

## 2018-01-14 DIAGNOSIS — N186 End stage renal disease: Secondary | ICD-10-CM | POA: Diagnosis not present

## 2018-01-14 DIAGNOSIS — Z992 Dependence on renal dialysis: Secondary | ICD-10-CM | POA: Diagnosis not present

## 2018-01-16 DIAGNOSIS — N186 End stage renal disease: Secondary | ICD-10-CM | POA: Diagnosis not present

## 2018-01-16 DIAGNOSIS — N2581 Secondary hyperparathyroidism of renal origin: Secondary | ICD-10-CM | POA: Diagnosis not present

## 2018-01-16 DIAGNOSIS — Z992 Dependence on renal dialysis: Secondary | ICD-10-CM | POA: Diagnosis not present

## 2018-01-16 DIAGNOSIS — D631 Anemia in chronic kidney disease: Secondary | ICD-10-CM | POA: Diagnosis not present

## 2018-01-16 DIAGNOSIS — D509 Iron deficiency anemia, unspecified: Secondary | ICD-10-CM | POA: Diagnosis not present

## 2018-01-18 DIAGNOSIS — D509 Iron deficiency anemia, unspecified: Secondary | ICD-10-CM | POA: Diagnosis not present

## 2018-01-18 DIAGNOSIS — Z992 Dependence on renal dialysis: Secondary | ICD-10-CM | POA: Diagnosis not present

## 2018-01-18 DIAGNOSIS — N186 End stage renal disease: Secondary | ICD-10-CM | POA: Diagnosis not present

## 2018-01-18 DIAGNOSIS — D631 Anemia in chronic kidney disease: Secondary | ICD-10-CM | POA: Diagnosis not present

## 2018-01-18 DIAGNOSIS — N2581 Secondary hyperparathyroidism of renal origin: Secondary | ICD-10-CM | POA: Diagnosis not present

## 2018-01-21 DIAGNOSIS — N2581 Secondary hyperparathyroidism of renal origin: Secondary | ICD-10-CM | POA: Diagnosis not present

## 2018-01-21 DIAGNOSIS — Z992 Dependence on renal dialysis: Secondary | ICD-10-CM | POA: Diagnosis not present

## 2018-01-21 DIAGNOSIS — N186 End stage renal disease: Secondary | ICD-10-CM | POA: Diagnosis not present

## 2018-01-21 DIAGNOSIS — D509 Iron deficiency anemia, unspecified: Secondary | ICD-10-CM | POA: Diagnosis not present

## 2018-01-21 DIAGNOSIS — D631 Anemia in chronic kidney disease: Secondary | ICD-10-CM | POA: Diagnosis not present

## 2018-01-23 DIAGNOSIS — N2581 Secondary hyperparathyroidism of renal origin: Secondary | ICD-10-CM | POA: Diagnosis not present

## 2018-01-23 DIAGNOSIS — Z992 Dependence on renal dialysis: Secondary | ICD-10-CM | POA: Diagnosis not present

## 2018-01-23 DIAGNOSIS — D631 Anemia in chronic kidney disease: Secondary | ICD-10-CM | POA: Diagnosis not present

## 2018-01-23 DIAGNOSIS — D509 Iron deficiency anemia, unspecified: Secondary | ICD-10-CM | POA: Diagnosis not present

## 2018-01-23 DIAGNOSIS — N186 End stage renal disease: Secondary | ICD-10-CM | POA: Diagnosis not present

## 2018-01-25 DIAGNOSIS — Z992 Dependence on renal dialysis: Secondary | ICD-10-CM | POA: Diagnosis not present

## 2018-01-25 DIAGNOSIS — D509 Iron deficiency anemia, unspecified: Secondary | ICD-10-CM | POA: Diagnosis not present

## 2018-01-25 DIAGNOSIS — N186 End stage renal disease: Secondary | ICD-10-CM | POA: Diagnosis not present

## 2018-01-25 DIAGNOSIS — D631 Anemia in chronic kidney disease: Secondary | ICD-10-CM | POA: Diagnosis not present

## 2018-01-25 DIAGNOSIS — N2581 Secondary hyperparathyroidism of renal origin: Secondary | ICD-10-CM | POA: Diagnosis not present

## 2018-01-28 DIAGNOSIS — D509 Iron deficiency anemia, unspecified: Secondary | ICD-10-CM | POA: Diagnosis not present

## 2018-01-28 DIAGNOSIS — Z992 Dependence on renal dialysis: Secondary | ICD-10-CM | POA: Diagnosis not present

## 2018-01-28 DIAGNOSIS — N2581 Secondary hyperparathyroidism of renal origin: Secondary | ICD-10-CM | POA: Diagnosis not present

## 2018-01-28 DIAGNOSIS — D631 Anemia in chronic kidney disease: Secondary | ICD-10-CM | POA: Diagnosis not present

## 2018-01-28 DIAGNOSIS — N186 End stage renal disease: Secondary | ICD-10-CM | POA: Diagnosis not present

## 2018-01-30 DIAGNOSIS — D509 Iron deficiency anemia, unspecified: Secondary | ICD-10-CM | POA: Diagnosis not present

## 2018-01-30 DIAGNOSIS — Z992 Dependence on renal dialysis: Secondary | ICD-10-CM | POA: Diagnosis not present

## 2018-01-30 DIAGNOSIS — D631 Anemia in chronic kidney disease: Secondary | ICD-10-CM | POA: Diagnosis not present

## 2018-01-30 DIAGNOSIS — N186 End stage renal disease: Secondary | ICD-10-CM | POA: Diagnosis not present

## 2018-01-30 DIAGNOSIS — N2581 Secondary hyperparathyroidism of renal origin: Secondary | ICD-10-CM | POA: Diagnosis not present

## 2018-02-01 DIAGNOSIS — N186 End stage renal disease: Secondary | ICD-10-CM | POA: Diagnosis not present

## 2018-02-01 DIAGNOSIS — D509 Iron deficiency anemia, unspecified: Secondary | ICD-10-CM | POA: Diagnosis not present

## 2018-02-01 DIAGNOSIS — N2581 Secondary hyperparathyroidism of renal origin: Secondary | ICD-10-CM | POA: Diagnosis not present

## 2018-02-01 DIAGNOSIS — D631 Anemia in chronic kidney disease: Secondary | ICD-10-CM | POA: Diagnosis not present

## 2018-02-01 DIAGNOSIS — Z992 Dependence on renal dialysis: Secondary | ICD-10-CM | POA: Diagnosis not present

## 2018-02-04 DIAGNOSIS — D509 Iron deficiency anemia, unspecified: Secondary | ICD-10-CM | POA: Diagnosis not present

## 2018-02-04 DIAGNOSIS — D631 Anemia in chronic kidney disease: Secondary | ICD-10-CM | POA: Diagnosis not present

## 2018-02-04 DIAGNOSIS — N186 End stage renal disease: Secondary | ICD-10-CM | POA: Diagnosis not present

## 2018-02-04 DIAGNOSIS — N2581 Secondary hyperparathyroidism of renal origin: Secondary | ICD-10-CM | POA: Diagnosis not present

## 2018-02-04 DIAGNOSIS — Z992 Dependence on renal dialysis: Secondary | ICD-10-CM | POA: Diagnosis not present

## 2018-02-06 DIAGNOSIS — N2581 Secondary hyperparathyroidism of renal origin: Secondary | ICD-10-CM | POA: Diagnosis not present

## 2018-02-06 DIAGNOSIS — N186 End stage renal disease: Secondary | ICD-10-CM | POA: Diagnosis not present

## 2018-02-06 DIAGNOSIS — D631 Anemia in chronic kidney disease: Secondary | ICD-10-CM | POA: Diagnosis not present

## 2018-02-06 DIAGNOSIS — Z992 Dependence on renal dialysis: Secondary | ICD-10-CM | POA: Diagnosis not present

## 2018-02-06 DIAGNOSIS — D509 Iron deficiency anemia, unspecified: Secondary | ICD-10-CM | POA: Diagnosis not present

## 2018-02-08 DIAGNOSIS — N186 End stage renal disease: Secondary | ICD-10-CM | POA: Diagnosis not present

## 2018-02-08 DIAGNOSIS — Z992 Dependence on renal dialysis: Secondary | ICD-10-CM | POA: Diagnosis not present

## 2018-02-08 DIAGNOSIS — D631 Anemia in chronic kidney disease: Secondary | ICD-10-CM | POA: Diagnosis not present

## 2018-02-08 DIAGNOSIS — N2581 Secondary hyperparathyroidism of renal origin: Secondary | ICD-10-CM | POA: Diagnosis not present

## 2018-02-08 DIAGNOSIS — D509 Iron deficiency anemia, unspecified: Secondary | ICD-10-CM | POA: Diagnosis not present

## 2018-02-11 DIAGNOSIS — N2581 Secondary hyperparathyroidism of renal origin: Secondary | ICD-10-CM | POA: Diagnosis not present

## 2018-02-11 DIAGNOSIS — N186 End stage renal disease: Secondary | ICD-10-CM | POA: Diagnosis not present

## 2018-02-11 DIAGNOSIS — D509 Iron deficiency anemia, unspecified: Secondary | ICD-10-CM | POA: Diagnosis not present

## 2018-02-11 DIAGNOSIS — E785 Hyperlipidemia, unspecified: Secondary | ICD-10-CM | POA: Diagnosis not present

## 2018-02-11 DIAGNOSIS — Z992 Dependence on renal dialysis: Secondary | ICD-10-CM | POA: Diagnosis not present

## 2018-02-11 DIAGNOSIS — D631 Anemia in chronic kidney disease: Secondary | ICD-10-CM | POA: Diagnosis not present

## 2018-02-13 DIAGNOSIS — Z992 Dependence on renal dialysis: Secondary | ICD-10-CM | POA: Diagnosis not present

## 2018-02-13 DIAGNOSIS — D509 Iron deficiency anemia, unspecified: Secondary | ICD-10-CM | POA: Diagnosis not present

## 2018-02-13 DIAGNOSIS — N186 End stage renal disease: Secondary | ICD-10-CM | POA: Diagnosis not present

## 2018-02-13 DIAGNOSIS — N2581 Secondary hyperparathyroidism of renal origin: Secondary | ICD-10-CM | POA: Diagnosis not present

## 2018-02-13 DIAGNOSIS — D631 Anemia in chronic kidney disease: Secondary | ICD-10-CM | POA: Diagnosis not present

## 2018-02-15 DIAGNOSIS — N186 End stage renal disease: Secondary | ICD-10-CM | POA: Diagnosis not present

## 2018-02-15 DIAGNOSIS — Z992 Dependence on renal dialysis: Secondary | ICD-10-CM | POA: Diagnosis not present

## 2018-02-15 DIAGNOSIS — D631 Anemia in chronic kidney disease: Secondary | ICD-10-CM | POA: Diagnosis not present

## 2018-02-15 DIAGNOSIS — N2581 Secondary hyperparathyroidism of renal origin: Secondary | ICD-10-CM | POA: Diagnosis not present

## 2018-02-15 DIAGNOSIS — D509 Iron deficiency anemia, unspecified: Secondary | ICD-10-CM | POA: Diagnosis not present

## 2018-02-16 DIAGNOSIS — N186 End stage renal disease: Secondary | ICD-10-CM | POA: Diagnosis not present

## 2018-02-16 DIAGNOSIS — Z992 Dependence on renal dialysis: Secondary | ICD-10-CM | POA: Diagnosis not present

## 2018-02-18 DIAGNOSIS — N186 End stage renal disease: Secondary | ICD-10-CM | POA: Diagnosis not present

## 2018-02-18 DIAGNOSIS — D631 Anemia in chronic kidney disease: Secondary | ICD-10-CM | POA: Diagnosis not present

## 2018-02-18 DIAGNOSIS — Z992 Dependence on renal dialysis: Secondary | ICD-10-CM | POA: Diagnosis not present

## 2018-02-18 DIAGNOSIS — N2581 Secondary hyperparathyroidism of renal origin: Secondary | ICD-10-CM | POA: Diagnosis not present

## 2018-02-18 DIAGNOSIS — D509 Iron deficiency anemia, unspecified: Secondary | ICD-10-CM | POA: Diagnosis not present

## 2018-02-20 DIAGNOSIS — N186 End stage renal disease: Secondary | ICD-10-CM | POA: Diagnosis not present

## 2018-02-20 DIAGNOSIS — Z992 Dependence on renal dialysis: Secondary | ICD-10-CM | POA: Diagnosis not present

## 2018-02-20 DIAGNOSIS — D509 Iron deficiency anemia, unspecified: Secondary | ICD-10-CM | POA: Diagnosis not present

## 2018-02-20 DIAGNOSIS — D631 Anemia in chronic kidney disease: Secondary | ICD-10-CM | POA: Diagnosis not present

## 2018-02-20 DIAGNOSIS — N2581 Secondary hyperparathyroidism of renal origin: Secondary | ICD-10-CM | POA: Diagnosis not present

## 2018-02-22 DIAGNOSIS — Z992 Dependence on renal dialysis: Secondary | ICD-10-CM | POA: Diagnosis not present

## 2018-02-22 DIAGNOSIS — D509 Iron deficiency anemia, unspecified: Secondary | ICD-10-CM | POA: Diagnosis not present

## 2018-02-22 DIAGNOSIS — N186 End stage renal disease: Secondary | ICD-10-CM | POA: Diagnosis not present

## 2018-02-22 DIAGNOSIS — N2581 Secondary hyperparathyroidism of renal origin: Secondary | ICD-10-CM | POA: Diagnosis not present

## 2018-02-22 DIAGNOSIS — D631 Anemia in chronic kidney disease: Secondary | ICD-10-CM | POA: Diagnosis not present

## 2018-02-25 DIAGNOSIS — Z992 Dependence on renal dialysis: Secondary | ICD-10-CM | POA: Diagnosis not present

## 2018-02-25 DIAGNOSIS — N186 End stage renal disease: Secondary | ICD-10-CM | POA: Diagnosis not present

## 2018-02-25 DIAGNOSIS — D509 Iron deficiency anemia, unspecified: Secondary | ICD-10-CM | POA: Diagnosis not present

## 2018-02-25 DIAGNOSIS — N2581 Secondary hyperparathyroidism of renal origin: Secondary | ICD-10-CM | POA: Diagnosis not present

## 2018-02-25 DIAGNOSIS — D631 Anemia in chronic kidney disease: Secondary | ICD-10-CM | POA: Diagnosis not present

## 2018-02-27 DIAGNOSIS — D631 Anemia in chronic kidney disease: Secondary | ICD-10-CM | POA: Diagnosis not present

## 2018-02-27 DIAGNOSIS — N2581 Secondary hyperparathyroidism of renal origin: Secondary | ICD-10-CM | POA: Diagnosis not present

## 2018-02-27 DIAGNOSIS — N186 End stage renal disease: Secondary | ICD-10-CM | POA: Diagnosis not present

## 2018-02-27 DIAGNOSIS — Z992 Dependence on renal dialysis: Secondary | ICD-10-CM | POA: Diagnosis not present

## 2018-02-27 DIAGNOSIS — D509 Iron deficiency anemia, unspecified: Secondary | ICD-10-CM | POA: Diagnosis not present

## 2018-03-01 DIAGNOSIS — N2581 Secondary hyperparathyroidism of renal origin: Secondary | ICD-10-CM | POA: Diagnosis not present

## 2018-03-01 DIAGNOSIS — D509 Iron deficiency anemia, unspecified: Secondary | ICD-10-CM | POA: Diagnosis not present

## 2018-03-01 DIAGNOSIS — D631 Anemia in chronic kidney disease: Secondary | ICD-10-CM | POA: Diagnosis not present

## 2018-03-01 DIAGNOSIS — Z992 Dependence on renal dialysis: Secondary | ICD-10-CM | POA: Diagnosis not present

## 2018-03-01 DIAGNOSIS — N186 End stage renal disease: Secondary | ICD-10-CM | POA: Diagnosis not present

## 2018-03-04 DIAGNOSIS — D509 Iron deficiency anemia, unspecified: Secondary | ICD-10-CM | POA: Diagnosis not present

## 2018-03-04 DIAGNOSIS — Z992 Dependence on renal dialysis: Secondary | ICD-10-CM | POA: Diagnosis not present

## 2018-03-04 DIAGNOSIS — N186 End stage renal disease: Secondary | ICD-10-CM | POA: Diagnosis not present

## 2018-03-04 DIAGNOSIS — D631 Anemia in chronic kidney disease: Secondary | ICD-10-CM | POA: Diagnosis not present

## 2018-03-04 DIAGNOSIS — N2581 Secondary hyperparathyroidism of renal origin: Secondary | ICD-10-CM | POA: Diagnosis not present

## 2018-03-06 DIAGNOSIS — D509 Iron deficiency anemia, unspecified: Secondary | ICD-10-CM | POA: Diagnosis not present

## 2018-03-06 DIAGNOSIS — N186 End stage renal disease: Secondary | ICD-10-CM | POA: Diagnosis not present

## 2018-03-06 DIAGNOSIS — Z992 Dependence on renal dialysis: Secondary | ICD-10-CM | POA: Diagnosis not present

## 2018-03-06 DIAGNOSIS — D631 Anemia in chronic kidney disease: Secondary | ICD-10-CM | POA: Diagnosis not present

## 2018-03-06 DIAGNOSIS — N2581 Secondary hyperparathyroidism of renal origin: Secondary | ICD-10-CM | POA: Diagnosis not present

## 2018-03-08 DIAGNOSIS — D509 Iron deficiency anemia, unspecified: Secondary | ICD-10-CM | POA: Diagnosis not present

## 2018-03-08 DIAGNOSIS — N2581 Secondary hyperparathyroidism of renal origin: Secondary | ICD-10-CM | POA: Diagnosis not present

## 2018-03-08 DIAGNOSIS — Z992 Dependence on renal dialysis: Secondary | ICD-10-CM | POA: Diagnosis not present

## 2018-03-08 DIAGNOSIS — N186 End stage renal disease: Secondary | ICD-10-CM | POA: Diagnosis not present

## 2018-03-08 DIAGNOSIS — D631 Anemia in chronic kidney disease: Secondary | ICD-10-CM | POA: Diagnosis not present

## 2018-03-11 DIAGNOSIS — N186 End stage renal disease: Secondary | ICD-10-CM | POA: Diagnosis not present

## 2018-03-11 DIAGNOSIS — N2581 Secondary hyperparathyroidism of renal origin: Secondary | ICD-10-CM | POA: Diagnosis not present

## 2018-03-11 DIAGNOSIS — D509 Iron deficiency anemia, unspecified: Secondary | ICD-10-CM | POA: Diagnosis not present

## 2018-03-11 DIAGNOSIS — Z992 Dependence on renal dialysis: Secondary | ICD-10-CM | POA: Diagnosis not present

## 2018-03-11 DIAGNOSIS — D631 Anemia in chronic kidney disease: Secondary | ICD-10-CM | POA: Diagnosis not present

## 2018-03-13 DIAGNOSIS — D509 Iron deficiency anemia, unspecified: Secondary | ICD-10-CM | POA: Diagnosis not present

## 2018-03-13 DIAGNOSIS — Z992 Dependence on renal dialysis: Secondary | ICD-10-CM | POA: Diagnosis not present

## 2018-03-13 DIAGNOSIS — D631 Anemia in chronic kidney disease: Secondary | ICD-10-CM | POA: Diagnosis not present

## 2018-03-13 DIAGNOSIS — N2581 Secondary hyperparathyroidism of renal origin: Secondary | ICD-10-CM | POA: Diagnosis not present

## 2018-03-13 DIAGNOSIS — N186 End stage renal disease: Secondary | ICD-10-CM | POA: Diagnosis not present

## 2018-03-15 DIAGNOSIS — N186 End stage renal disease: Secondary | ICD-10-CM | POA: Diagnosis not present

## 2018-03-15 DIAGNOSIS — D631 Anemia in chronic kidney disease: Secondary | ICD-10-CM | POA: Diagnosis not present

## 2018-03-15 DIAGNOSIS — Z992 Dependence on renal dialysis: Secondary | ICD-10-CM | POA: Diagnosis not present

## 2018-03-15 DIAGNOSIS — D509 Iron deficiency anemia, unspecified: Secondary | ICD-10-CM | POA: Diagnosis not present

## 2018-03-15 DIAGNOSIS — N2581 Secondary hyperparathyroidism of renal origin: Secondary | ICD-10-CM | POA: Diagnosis not present

## 2018-03-18 DIAGNOSIS — D631 Anemia in chronic kidney disease: Secondary | ICD-10-CM | POA: Diagnosis not present

## 2018-03-18 DIAGNOSIS — N2581 Secondary hyperparathyroidism of renal origin: Secondary | ICD-10-CM | POA: Diagnosis not present

## 2018-03-18 DIAGNOSIS — Z992 Dependence on renal dialysis: Secondary | ICD-10-CM | POA: Diagnosis not present

## 2018-03-18 DIAGNOSIS — N186 End stage renal disease: Secondary | ICD-10-CM | POA: Diagnosis not present

## 2018-03-18 DIAGNOSIS — D509 Iron deficiency anemia, unspecified: Secondary | ICD-10-CM | POA: Diagnosis not present

## 2018-03-20 DIAGNOSIS — D631 Anemia in chronic kidney disease: Secondary | ICD-10-CM | POA: Diagnosis not present

## 2018-03-20 DIAGNOSIS — N186 End stage renal disease: Secondary | ICD-10-CM | POA: Diagnosis not present

## 2018-03-20 DIAGNOSIS — Z992 Dependence on renal dialysis: Secondary | ICD-10-CM | POA: Diagnosis not present

## 2018-03-20 DIAGNOSIS — D509 Iron deficiency anemia, unspecified: Secondary | ICD-10-CM | POA: Diagnosis not present

## 2018-03-20 DIAGNOSIS — N2581 Secondary hyperparathyroidism of renal origin: Secondary | ICD-10-CM | POA: Diagnosis not present

## 2018-03-22 DIAGNOSIS — Z992 Dependence on renal dialysis: Secondary | ICD-10-CM | POA: Diagnosis not present

## 2018-03-22 DIAGNOSIS — D631 Anemia in chronic kidney disease: Secondary | ICD-10-CM | POA: Diagnosis not present

## 2018-03-22 DIAGNOSIS — N186 End stage renal disease: Secondary | ICD-10-CM | POA: Diagnosis not present

## 2018-03-22 DIAGNOSIS — N2581 Secondary hyperparathyroidism of renal origin: Secondary | ICD-10-CM | POA: Diagnosis not present

## 2018-03-22 DIAGNOSIS — D509 Iron deficiency anemia, unspecified: Secondary | ICD-10-CM | POA: Diagnosis not present

## 2018-03-25 DIAGNOSIS — D631 Anemia in chronic kidney disease: Secondary | ICD-10-CM | POA: Diagnosis not present

## 2018-03-25 DIAGNOSIS — D509 Iron deficiency anemia, unspecified: Secondary | ICD-10-CM | POA: Diagnosis not present

## 2018-03-25 DIAGNOSIS — N2581 Secondary hyperparathyroidism of renal origin: Secondary | ICD-10-CM | POA: Diagnosis not present

## 2018-03-25 DIAGNOSIS — N186 End stage renal disease: Secondary | ICD-10-CM | POA: Diagnosis not present

## 2018-03-25 DIAGNOSIS — Z992 Dependence on renal dialysis: Secondary | ICD-10-CM | POA: Diagnosis not present

## 2018-03-27 DIAGNOSIS — D509 Iron deficiency anemia, unspecified: Secondary | ICD-10-CM | POA: Diagnosis not present

## 2018-03-27 DIAGNOSIS — N2581 Secondary hyperparathyroidism of renal origin: Secondary | ICD-10-CM | POA: Diagnosis not present

## 2018-03-27 DIAGNOSIS — N186 End stage renal disease: Secondary | ICD-10-CM | POA: Diagnosis not present

## 2018-03-27 DIAGNOSIS — D631 Anemia in chronic kidney disease: Secondary | ICD-10-CM | POA: Diagnosis not present

## 2018-03-27 DIAGNOSIS — Z992 Dependence on renal dialysis: Secondary | ICD-10-CM | POA: Diagnosis not present

## 2018-03-29 DIAGNOSIS — N186 End stage renal disease: Secondary | ICD-10-CM | POA: Diagnosis not present

## 2018-03-29 DIAGNOSIS — Z992 Dependence on renal dialysis: Secondary | ICD-10-CM | POA: Diagnosis not present

## 2018-03-29 DIAGNOSIS — N2581 Secondary hyperparathyroidism of renal origin: Secondary | ICD-10-CM | POA: Diagnosis not present

## 2018-03-29 DIAGNOSIS — D509 Iron deficiency anemia, unspecified: Secondary | ICD-10-CM | POA: Diagnosis not present

## 2018-03-29 DIAGNOSIS — D631 Anemia in chronic kidney disease: Secondary | ICD-10-CM | POA: Diagnosis not present

## 2018-04-01 DIAGNOSIS — N186 End stage renal disease: Secondary | ICD-10-CM | POA: Diagnosis not present

## 2018-04-01 DIAGNOSIS — N2581 Secondary hyperparathyroidism of renal origin: Secondary | ICD-10-CM | POA: Diagnosis not present

## 2018-04-01 DIAGNOSIS — D631 Anemia in chronic kidney disease: Secondary | ICD-10-CM | POA: Diagnosis not present

## 2018-04-01 DIAGNOSIS — D509 Iron deficiency anemia, unspecified: Secondary | ICD-10-CM | POA: Diagnosis not present

## 2018-04-01 DIAGNOSIS — Z992 Dependence on renal dialysis: Secondary | ICD-10-CM | POA: Diagnosis not present

## 2018-04-03 DIAGNOSIS — N2581 Secondary hyperparathyroidism of renal origin: Secondary | ICD-10-CM | POA: Diagnosis not present

## 2018-04-03 DIAGNOSIS — Z992 Dependence on renal dialysis: Secondary | ICD-10-CM | POA: Diagnosis not present

## 2018-04-03 DIAGNOSIS — D631 Anemia in chronic kidney disease: Secondary | ICD-10-CM | POA: Diagnosis not present

## 2018-04-03 DIAGNOSIS — D509 Iron deficiency anemia, unspecified: Secondary | ICD-10-CM | POA: Diagnosis not present

## 2018-04-03 DIAGNOSIS — N186 End stage renal disease: Secondary | ICD-10-CM | POA: Diagnosis not present

## 2018-04-04 DIAGNOSIS — M79605 Pain in left leg: Secondary | ICD-10-CM | POA: Diagnosis not present

## 2018-04-04 DIAGNOSIS — Z992 Dependence on renal dialysis: Secondary | ICD-10-CM | POA: Diagnosis not present

## 2018-04-04 DIAGNOSIS — I270 Primary pulmonary hypertension: Secondary | ICD-10-CM | POA: Diagnosis not present

## 2018-04-04 DIAGNOSIS — Z6823 Body mass index (BMI) 23.0-23.9, adult: Secondary | ICD-10-CM | POA: Diagnosis not present

## 2018-04-04 DIAGNOSIS — N186 End stage renal disease: Secondary | ICD-10-CM | POA: Diagnosis not present

## 2018-04-04 DIAGNOSIS — M79662 Pain in left lower leg: Secondary | ICD-10-CM | POA: Diagnosis not present

## 2018-04-04 DIAGNOSIS — Z299 Encounter for prophylactic measures, unspecified: Secondary | ICD-10-CM | POA: Diagnosis not present

## 2018-04-05 DIAGNOSIS — N186 End stage renal disease: Secondary | ICD-10-CM | POA: Diagnosis not present

## 2018-04-05 DIAGNOSIS — D509 Iron deficiency anemia, unspecified: Secondary | ICD-10-CM | POA: Diagnosis not present

## 2018-04-05 DIAGNOSIS — Z992 Dependence on renal dialysis: Secondary | ICD-10-CM | POA: Diagnosis not present

## 2018-04-05 DIAGNOSIS — D631 Anemia in chronic kidney disease: Secondary | ICD-10-CM | POA: Diagnosis not present

## 2018-04-05 DIAGNOSIS — N2581 Secondary hyperparathyroidism of renal origin: Secondary | ICD-10-CM | POA: Diagnosis not present

## 2018-04-08 DIAGNOSIS — D631 Anemia in chronic kidney disease: Secondary | ICD-10-CM | POA: Diagnosis not present

## 2018-04-08 DIAGNOSIS — N186 End stage renal disease: Secondary | ICD-10-CM | POA: Diagnosis not present

## 2018-04-08 DIAGNOSIS — N2581 Secondary hyperparathyroidism of renal origin: Secondary | ICD-10-CM | POA: Diagnosis not present

## 2018-04-08 DIAGNOSIS — D509 Iron deficiency anemia, unspecified: Secondary | ICD-10-CM | POA: Diagnosis not present

## 2018-04-08 DIAGNOSIS — Z992 Dependence on renal dialysis: Secondary | ICD-10-CM | POA: Diagnosis not present

## 2018-04-10 DIAGNOSIS — Z992 Dependence on renal dialysis: Secondary | ICD-10-CM | POA: Diagnosis not present

## 2018-04-10 DIAGNOSIS — N2581 Secondary hyperparathyroidism of renal origin: Secondary | ICD-10-CM | POA: Diagnosis not present

## 2018-04-10 DIAGNOSIS — N186 End stage renal disease: Secondary | ICD-10-CM | POA: Diagnosis not present

## 2018-04-10 DIAGNOSIS — D509 Iron deficiency anemia, unspecified: Secondary | ICD-10-CM | POA: Diagnosis not present

## 2018-04-10 DIAGNOSIS — D631 Anemia in chronic kidney disease: Secondary | ICD-10-CM | POA: Diagnosis not present

## 2018-04-12 DIAGNOSIS — D509 Iron deficiency anemia, unspecified: Secondary | ICD-10-CM | POA: Diagnosis not present

## 2018-04-12 DIAGNOSIS — Z992 Dependence on renal dialysis: Secondary | ICD-10-CM | POA: Diagnosis not present

## 2018-04-12 DIAGNOSIS — D631 Anemia in chronic kidney disease: Secondary | ICD-10-CM | POA: Diagnosis not present

## 2018-04-12 DIAGNOSIS — N2581 Secondary hyperparathyroidism of renal origin: Secondary | ICD-10-CM | POA: Diagnosis not present

## 2018-04-12 DIAGNOSIS — N186 End stage renal disease: Secondary | ICD-10-CM | POA: Diagnosis not present

## 2018-04-15 DIAGNOSIS — N186 End stage renal disease: Secondary | ICD-10-CM | POA: Diagnosis not present

## 2018-04-15 DIAGNOSIS — N2581 Secondary hyperparathyroidism of renal origin: Secondary | ICD-10-CM | POA: Diagnosis not present

## 2018-04-15 DIAGNOSIS — D509 Iron deficiency anemia, unspecified: Secondary | ICD-10-CM | POA: Diagnosis not present

## 2018-04-15 DIAGNOSIS — D631 Anemia in chronic kidney disease: Secondary | ICD-10-CM | POA: Diagnosis not present

## 2018-04-15 DIAGNOSIS — Z992 Dependence on renal dialysis: Secondary | ICD-10-CM | POA: Diagnosis not present

## 2018-04-17 DIAGNOSIS — N2581 Secondary hyperparathyroidism of renal origin: Secondary | ICD-10-CM | POA: Diagnosis not present

## 2018-04-17 DIAGNOSIS — D631 Anemia in chronic kidney disease: Secondary | ICD-10-CM | POA: Diagnosis not present

## 2018-04-17 DIAGNOSIS — I10 Essential (primary) hypertension: Secondary | ICD-10-CM | POA: Diagnosis not present

## 2018-04-17 DIAGNOSIS — D509 Iron deficiency anemia, unspecified: Secondary | ICD-10-CM | POA: Diagnosis not present

## 2018-04-17 DIAGNOSIS — N186 End stage renal disease: Secondary | ICD-10-CM | POA: Diagnosis not present

## 2018-04-17 DIAGNOSIS — Z992 Dependence on renal dialysis: Secondary | ICD-10-CM | POA: Diagnosis not present

## 2018-04-17 DIAGNOSIS — E7800 Pure hypercholesterolemia, unspecified: Secondary | ICD-10-CM | POA: Diagnosis not present

## 2018-04-18 DIAGNOSIS — N186 End stage renal disease: Secondary | ICD-10-CM | POA: Diagnosis not present

## 2018-04-18 DIAGNOSIS — Z992 Dependence on renal dialysis: Secondary | ICD-10-CM | POA: Diagnosis not present

## 2018-04-19 DIAGNOSIS — D631 Anemia in chronic kidney disease: Secondary | ICD-10-CM | POA: Diagnosis not present

## 2018-04-19 DIAGNOSIS — Z992 Dependence on renal dialysis: Secondary | ICD-10-CM | POA: Diagnosis not present

## 2018-04-19 DIAGNOSIS — N186 End stage renal disease: Secondary | ICD-10-CM | POA: Diagnosis not present

## 2018-04-19 DIAGNOSIS — D509 Iron deficiency anemia, unspecified: Secondary | ICD-10-CM | POA: Diagnosis not present

## 2018-04-19 DIAGNOSIS — N2581 Secondary hyperparathyroidism of renal origin: Secondary | ICD-10-CM | POA: Diagnosis not present

## 2018-04-22 DIAGNOSIS — N186 End stage renal disease: Secondary | ICD-10-CM | POA: Diagnosis not present

## 2018-04-22 DIAGNOSIS — D631 Anemia in chronic kidney disease: Secondary | ICD-10-CM | POA: Diagnosis not present

## 2018-04-22 DIAGNOSIS — N2581 Secondary hyperparathyroidism of renal origin: Secondary | ICD-10-CM | POA: Diagnosis not present

## 2018-04-22 DIAGNOSIS — D509 Iron deficiency anemia, unspecified: Secondary | ICD-10-CM | POA: Diagnosis not present

## 2018-04-22 DIAGNOSIS — Z992 Dependence on renal dialysis: Secondary | ICD-10-CM | POA: Diagnosis not present

## 2018-04-24 DIAGNOSIS — Z992 Dependence on renal dialysis: Secondary | ICD-10-CM | POA: Diagnosis not present

## 2018-04-24 DIAGNOSIS — N2581 Secondary hyperparathyroidism of renal origin: Secondary | ICD-10-CM | POA: Diagnosis not present

## 2018-04-24 DIAGNOSIS — D631 Anemia in chronic kidney disease: Secondary | ICD-10-CM | POA: Diagnosis not present

## 2018-04-24 DIAGNOSIS — D509 Iron deficiency anemia, unspecified: Secondary | ICD-10-CM | POA: Diagnosis not present

## 2018-04-24 DIAGNOSIS — N186 End stage renal disease: Secondary | ICD-10-CM | POA: Diagnosis not present

## 2018-04-25 DIAGNOSIS — D509 Iron deficiency anemia, unspecified: Secondary | ICD-10-CM | POA: Diagnosis not present

## 2018-04-25 DIAGNOSIS — Z992 Dependence on renal dialysis: Secondary | ICD-10-CM | POA: Diagnosis not present

## 2018-04-25 DIAGNOSIS — N2581 Secondary hyperparathyroidism of renal origin: Secondary | ICD-10-CM | POA: Diagnosis not present

## 2018-04-25 DIAGNOSIS — D631 Anemia in chronic kidney disease: Secondary | ICD-10-CM | POA: Diagnosis not present

## 2018-04-25 DIAGNOSIS — N186 End stage renal disease: Secondary | ICD-10-CM | POA: Diagnosis not present

## 2018-04-28 DIAGNOSIS — D509 Iron deficiency anemia, unspecified: Secondary | ICD-10-CM | POA: Diagnosis not present

## 2018-04-28 DIAGNOSIS — D631 Anemia in chronic kidney disease: Secondary | ICD-10-CM | POA: Diagnosis not present

## 2018-04-28 DIAGNOSIS — Z992 Dependence on renal dialysis: Secondary | ICD-10-CM | POA: Diagnosis not present

## 2018-04-28 DIAGNOSIS — N186 End stage renal disease: Secondary | ICD-10-CM | POA: Diagnosis not present

## 2018-04-28 DIAGNOSIS — N2581 Secondary hyperparathyroidism of renal origin: Secondary | ICD-10-CM | POA: Diagnosis not present

## 2018-05-01 DIAGNOSIS — N186 End stage renal disease: Secondary | ICD-10-CM | POA: Diagnosis not present

## 2018-05-01 DIAGNOSIS — Z992 Dependence on renal dialysis: Secondary | ICD-10-CM | POA: Diagnosis not present

## 2018-05-01 DIAGNOSIS — D631 Anemia in chronic kidney disease: Secondary | ICD-10-CM | POA: Diagnosis not present

## 2018-05-01 DIAGNOSIS — N2581 Secondary hyperparathyroidism of renal origin: Secondary | ICD-10-CM | POA: Diagnosis not present

## 2018-05-01 DIAGNOSIS — D509 Iron deficiency anemia, unspecified: Secondary | ICD-10-CM | POA: Diagnosis not present

## 2018-05-02 ENCOUNTER — Encounter (INDEPENDENT_AMBULATORY_CARE_PROVIDER_SITE_OTHER): Payer: Medicare Other

## 2018-05-02 ENCOUNTER — Ambulatory Visit (INDEPENDENT_AMBULATORY_CARE_PROVIDER_SITE_OTHER): Payer: PRIVATE HEALTH INSURANCE | Admitting: Vascular Surgery

## 2018-05-03 DIAGNOSIS — Z992 Dependence on renal dialysis: Secondary | ICD-10-CM | POA: Diagnosis not present

## 2018-05-03 DIAGNOSIS — N186 End stage renal disease: Secondary | ICD-10-CM | POA: Diagnosis not present

## 2018-05-03 DIAGNOSIS — D509 Iron deficiency anemia, unspecified: Secondary | ICD-10-CM | POA: Diagnosis not present

## 2018-05-03 DIAGNOSIS — N2581 Secondary hyperparathyroidism of renal origin: Secondary | ICD-10-CM | POA: Diagnosis not present

## 2018-05-03 DIAGNOSIS — D631 Anemia in chronic kidney disease: Secondary | ICD-10-CM | POA: Diagnosis not present

## 2018-05-06 DIAGNOSIS — Z992 Dependence on renal dialysis: Secondary | ICD-10-CM | POA: Diagnosis not present

## 2018-05-06 DIAGNOSIS — D631 Anemia in chronic kidney disease: Secondary | ICD-10-CM | POA: Diagnosis not present

## 2018-05-06 DIAGNOSIS — D509 Iron deficiency anemia, unspecified: Secondary | ICD-10-CM | POA: Diagnosis not present

## 2018-05-06 DIAGNOSIS — N186 End stage renal disease: Secondary | ICD-10-CM | POA: Diagnosis not present

## 2018-05-06 DIAGNOSIS — N2581 Secondary hyperparathyroidism of renal origin: Secondary | ICD-10-CM | POA: Diagnosis not present

## 2018-05-08 DIAGNOSIS — N186 End stage renal disease: Secondary | ICD-10-CM | POA: Diagnosis not present

## 2018-05-08 DIAGNOSIS — D509 Iron deficiency anemia, unspecified: Secondary | ICD-10-CM | POA: Diagnosis not present

## 2018-05-08 DIAGNOSIS — Z992 Dependence on renal dialysis: Secondary | ICD-10-CM | POA: Diagnosis not present

## 2018-05-08 DIAGNOSIS — N2581 Secondary hyperparathyroidism of renal origin: Secondary | ICD-10-CM | POA: Diagnosis not present

## 2018-05-08 DIAGNOSIS — D631 Anemia in chronic kidney disease: Secondary | ICD-10-CM | POA: Diagnosis not present

## 2018-05-10 DIAGNOSIS — Z992 Dependence on renal dialysis: Secondary | ICD-10-CM | POA: Diagnosis not present

## 2018-05-10 DIAGNOSIS — N2581 Secondary hyperparathyroidism of renal origin: Secondary | ICD-10-CM | POA: Diagnosis not present

## 2018-05-10 DIAGNOSIS — N186 End stage renal disease: Secondary | ICD-10-CM | POA: Diagnosis not present

## 2018-05-10 DIAGNOSIS — D631 Anemia in chronic kidney disease: Secondary | ICD-10-CM | POA: Diagnosis not present

## 2018-05-10 DIAGNOSIS — D509 Iron deficiency anemia, unspecified: Secondary | ICD-10-CM | POA: Diagnosis not present

## 2018-05-13 DIAGNOSIS — N2581 Secondary hyperparathyroidism of renal origin: Secondary | ICD-10-CM | POA: Diagnosis not present

## 2018-05-13 DIAGNOSIS — D631 Anemia in chronic kidney disease: Secondary | ICD-10-CM | POA: Diagnosis not present

## 2018-05-13 DIAGNOSIS — D509 Iron deficiency anemia, unspecified: Secondary | ICD-10-CM | POA: Diagnosis not present

## 2018-05-13 DIAGNOSIS — Z992 Dependence on renal dialysis: Secondary | ICD-10-CM | POA: Diagnosis not present

## 2018-05-13 DIAGNOSIS — N186 End stage renal disease: Secondary | ICD-10-CM | POA: Diagnosis not present

## 2018-05-15 DIAGNOSIS — Z992 Dependence on renal dialysis: Secondary | ICD-10-CM | POA: Diagnosis not present

## 2018-05-15 DIAGNOSIS — N2581 Secondary hyperparathyroidism of renal origin: Secondary | ICD-10-CM | POA: Diagnosis not present

## 2018-05-15 DIAGNOSIS — D631 Anemia in chronic kidney disease: Secondary | ICD-10-CM | POA: Diagnosis not present

## 2018-05-15 DIAGNOSIS — N186 End stage renal disease: Secondary | ICD-10-CM | POA: Diagnosis not present

## 2018-05-15 DIAGNOSIS — D509 Iron deficiency anemia, unspecified: Secondary | ICD-10-CM | POA: Diagnosis not present

## 2018-05-17 DIAGNOSIS — N2581 Secondary hyperparathyroidism of renal origin: Secondary | ICD-10-CM | POA: Diagnosis not present

## 2018-05-17 DIAGNOSIS — Z992 Dependence on renal dialysis: Secondary | ICD-10-CM | POA: Diagnosis not present

## 2018-05-17 DIAGNOSIS — D631 Anemia in chronic kidney disease: Secondary | ICD-10-CM | POA: Diagnosis not present

## 2018-05-17 DIAGNOSIS — N186 End stage renal disease: Secondary | ICD-10-CM | POA: Diagnosis not present

## 2018-05-17 DIAGNOSIS — D509 Iron deficiency anemia, unspecified: Secondary | ICD-10-CM | POA: Diagnosis not present

## 2018-05-18 DIAGNOSIS — Z992 Dependence on renal dialysis: Secondary | ICD-10-CM | POA: Diagnosis not present

## 2018-05-18 DIAGNOSIS — N186 End stage renal disease: Secondary | ICD-10-CM | POA: Diagnosis not present

## 2018-05-20 DIAGNOSIS — D631 Anemia in chronic kidney disease: Secondary | ICD-10-CM | POA: Diagnosis not present

## 2018-05-20 DIAGNOSIS — N2581 Secondary hyperparathyroidism of renal origin: Secondary | ICD-10-CM | POA: Diagnosis not present

## 2018-05-20 DIAGNOSIS — Z992 Dependence on renal dialysis: Secondary | ICD-10-CM | POA: Diagnosis not present

## 2018-05-20 DIAGNOSIS — N186 End stage renal disease: Secondary | ICD-10-CM | POA: Diagnosis not present

## 2018-05-20 DIAGNOSIS — D509 Iron deficiency anemia, unspecified: Secondary | ICD-10-CM | POA: Diagnosis not present

## 2018-05-22 DIAGNOSIS — D631 Anemia in chronic kidney disease: Secondary | ICD-10-CM | POA: Diagnosis not present

## 2018-05-22 DIAGNOSIS — N2581 Secondary hyperparathyroidism of renal origin: Secondary | ICD-10-CM | POA: Diagnosis not present

## 2018-05-22 DIAGNOSIS — D509 Iron deficiency anemia, unspecified: Secondary | ICD-10-CM | POA: Diagnosis not present

## 2018-05-22 DIAGNOSIS — N186 End stage renal disease: Secondary | ICD-10-CM | POA: Diagnosis not present

## 2018-05-22 DIAGNOSIS — Z992 Dependence on renal dialysis: Secondary | ICD-10-CM | POA: Diagnosis not present

## 2018-05-24 DIAGNOSIS — D509 Iron deficiency anemia, unspecified: Secondary | ICD-10-CM | POA: Diagnosis not present

## 2018-05-24 DIAGNOSIS — N186 End stage renal disease: Secondary | ICD-10-CM | POA: Diagnosis not present

## 2018-05-24 DIAGNOSIS — N2581 Secondary hyperparathyroidism of renal origin: Secondary | ICD-10-CM | POA: Diagnosis not present

## 2018-05-24 DIAGNOSIS — Z992 Dependence on renal dialysis: Secondary | ICD-10-CM | POA: Diagnosis not present

## 2018-05-24 DIAGNOSIS — D631 Anemia in chronic kidney disease: Secondary | ICD-10-CM | POA: Diagnosis not present

## 2018-05-27 DIAGNOSIS — D509 Iron deficiency anemia, unspecified: Secondary | ICD-10-CM | POA: Diagnosis not present

## 2018-05-27 DIAGNOSIS — Z992 Dependence on renal dialysis: Secondary | ICD-10-CM | POA: Diagnosis not present

## 2018-05-27 DIAGNOSIS — D631 Anemia in chronic kidney disease: Secondary | ICD-10-CM | POA: Diagnosis not present

## 2018-05-27 DIAGNOSIS — N2581 Secondary hyperparathyroidism of renal origin: Secondary | ICD-10-CM | POA: Diagnosis not present

## 2018-05-27 DIAGNOSIS — N186 End stage renal disease: Secondary | ICD-10-CM | POA: Diagnosis not present

## 2018-05-29 DIAGNOSIS — Z992 Dependence on renal dialysis: Secondary | ICD-10-CM | POA: Diagnosis not present

## 2018-05-29 DIAGNOSIS — D509 Iron deficiency anemia, unspecified: Secondary | ICD-10-CM | POA: Diagnosis not present

## 2018-05-29 DIAGNOSIS — N186 End stage renal disease: Secondary | ICD-10-CM | POA: Diagnosis not present

## 2018-05-29 DIAGNOSIS — N2581 Secondary hyperparathyroidism of renal origin: Secondary | ICD-10-CM | POA: Diagnosis not present

## 2018-05-29 DIAGNOSIS — D631 Anemia in chronic kidney disease: Secondary | ICD-10-CM | POA: Diagnosis not present

## 2018-05-31 DIAGNOSIS — N2581 Secondary hyperparathyroidism of renal origin: Secondary | ICD-10-CM | POA: Diagnosis not present

## 2018-05-31 DIAGNOSIS — N186 End stage renal disease: Secondary | ICD-10-CM | POA: Diagnosis not present

## 2018-05-31 DIAGNOSIS — Z992 Dependence on renal dialysis: Secondary | ICD-10-CM | POA: Diagnosis not present

## 2018-05-31 DIAGNOSIS — D509 Iron deficiency anemia, unspecified: Secondary | ICD-10-CM | POA: Diagnosis not present

## 2018-05-31 DIAGNOSIS — D631 Anemia in chronic kidney disease: Secondary | ICD-10-CM | POA: Diagnosis not present

## 2018-06-03 DIAGNOSIS — Z992 Dependence on renal dialysis: Secondary | ICD-10-CM | POA: Diagnosis not present

## 2018-06-03 DIAGNOSIS — N186 End stage renal disease: Secondary | ICD-10-CM | POA: Diagnosis not present

## 2018-06-03 DIAGNOSIS — D631 Anemia in chronic kidney disease: Secondary | ICD-10-CM | POA: Diagnosis not present

## 2018-06-03 DIAGNOSIS — N2581 Secondary hyperparathyroidism of renal origin: Secondary | ICD-10-CM | POA: Diagnosis not present

## 2018-06-03 DIAGNOSIS — D509 Iron deficiency anemia, unspecified: Secondary | ICD-10-CM | POA: Diagnosis not present

## 2018-06-05 DIAGNOSIS — Z992 Dependence on renal dialysis: Secondary | ICD-10-CM | POA: Diagnosis not present

## 2018-06-05 DIAGNOSIS — N2581 Secondary hyperparathyroidism of renal origin: Secondary | ICD-10-CM | POA: Diagnosis not present

## 2018-06-05 DIAGNOSIS — D509 Iron deficiency anemia, unspecified: Secondary | ICD-10-CM | POA: Diagnosis not present

## 2018-06-05 DIAGNOSIS — N186 End stage renal disease: Secondary | ICD-10-CM | POA: Diagnosis not present

## 2018-06-05 DIAGNOSIS — D631 Anemia in chronic kidney disease: Secondary | ICD-10-CM | POA: Diagnosis not present

## 2018-06-07 DIAGNOSIS — D509 Iron deficiency anemia, unspecified: Secondary | ICD-10-CM | POA: Diagnosis not present

## 2018-06-07 DIAGNOSIS — N186 End stage renal disease: Secondary | ICD-10-CM | POA: Diagnosis not present

## 2018-06-07 DIAGNOSIS — N2581 Secondary hyperparathyroidism of renal origin: Secondary | ICD-10-CM | POA: Diagnosis not present

## 2018-06-07 DIAGNOSIS — Z992 Dependence on renal dialysis: Secondary | ICD-10-CM | POA: Diagnosis not present

## 2018-06-07 DIAGNOSIS — D631 Anemia in chronic kidney disease: Secondary | ICD-10-CM | POA: Diagnosis not present

## 2018-06-10 DIAGNOSIS — N186 End stage renal disease: Secondary | ICD-10-CM | POA: Diagnosis not present

## 2018-06-10 DIAGNOSIS — D631 Anemia in chronic kidney disease: Secondary | ICD-10-CM | POA: Diagnosis not present

## 2018-06-10 DIAGNOSIS — N2581 Secondary hyperparathyroidism of renal origin: Secondary | ICD-10-CM | POA: Diagnosis not present

## 2018-06-10 DIAGNOSIS — Z992 Dependence on renal dialysis: Secondary | ICD-10-CM | POA: Diagnosis not present

## 2018-06-10 DIAGNOSIS — D509 Iron deficiency anemia, unspecified: Secondary | ICD-10-CM | POA: Diagnosis not present

## 2018-06-12 DIAGNOSIS — N2581 Secondary hyperparathyroidism of renal origin: Secondary | ICD-10-CM | POA: Diagnosis not present

## 2018-06-12 DIAGNOSIS — Z992 Dependence on renal dialysis: Secondary | ICD-10-CM | POA: Diagnosis not present

## 2018-06-12 DIAGNOSIS — N186 End stage renal disease: Secondary | ICD-10-CM | POA: Diagnosis not present

## 2018-06-12 DIAGNOSIS — D631 Anemia in chronic kidney disease: Secondary | ICD-10-CM | POA: Diagnosis not present

## 2018-06-12 DIAGNOSIS — D509 Iron deficiency anemia, unspecified: Secondary | ICD-10-CM | POA: Diagnosis not present

## 2018-06-14 DIAGNOSIS — D631 Anemia in chronic kidney disease: Secondary | ICD-10-CM | POA: Diagnosis not present

## 2018-06-14 DIAGNOSIS — Z992 Dependence on renal dialysis: Secondary | ICD-10-CM | POA: Diagnosis not present

## 2018-06-14 DIAGNOSIS — N2581 Secondary hyperparathyroidism of renal origin: Secondary | ICD-10-CM | POA: Diagnosis not present

## 2018-06-14 DIAGNOSIS — D509 Iron deficiency anemia, unspecified: Secondary | ICD-10-CM | POA: Diagnosis not present

## 2018-06-14 DIAGNOSIS — N186 End stage renal disease: Secondary | ICD-10-CM | POA: Diagnosis not present

## 2018-06-17 DIAGNOSIS — D631 Anemia in chronic kidney disease: Secondary | ICD-10-CM | POA: Diagnosis not present

## 2018-06-17 DIAGNOSIS — N2581 Secondary hyperparathyroidism of renal origin: Secondary | ICD-10-CM | POA: Diagnosis not present

## 2018-06-17 DIAGNOSIS — N186 End stage renal disease: Secondary | ICD-10-CM | POA: Diagnosis not present

## 2018-06-17 DIAGNOSIS — Z992 Dependence on renal dialysis: Secondary | ICD-10-CM | POA: Diagnosis not present

## 2018-06-17 DIAGNOSIS — D509 Iron deficiency anemia, unspecified: Secondary | ICD-10-CM | POA: Diagnosis not present

## 2018-06-18 DIAGNOSIS — Z992 Dependence on renal dialysis: Secondary | ICD-10-CM | POA: Diagnosis not present

## 2018-06-18 DIAGNOSIS — N186 End stage renal disease: Secondary | ICD-10-CM | POA: Diagnosis not present

## 2018-06-19 DIAGNOSIS — N2581 Secondary hyperparathyroidism of renal origin: Secondary | ICD-10-CM | POA: Diagnosis not present

## 2018-06-19 DIAGNOSIS — Z992 Dependence on renal dialysis: Secondary | ICD-10-CM | POA: Diagnosis not present

## 2018-06-19 DIAGNOSIS — D509 Iron deficiency anemia, unspecified: Secondary | ICD-10-CM | POA: Diagnosis not present

## 2018-06-19 DIAGNOSIS — N186 End stage renal disease: Secondary | ICD-10-CM | POA: Diagnosis not present

## 2018-06-19 DIAGNOSIS — D631 Anemia in chronic kidney disease: Secondary | ICD-10-CM | POA: Diagnosis not present

## 2018-06-20 DIAGNOSIS — I10 Essential (primary) hypertension: Secondary | ICD-10-CM | POA: Diagnosis not present

## 2018-06-20 DIAGNOSIS — E7800 Pure hypercholesterolemia, unspecified: Secondary | ICD-10-CM | POA: Diagnosis not present

## 2018-06-21 DIAGNOSIS — D509 Iron deficiency anemia, unspecified: Secondary | ICD-10-CM | POA: Diagnosis not present

## 2018-06-21 DIAGNOSIS — Z992 Dependence on renal dialysis: Secondary | ICD-10-CM | POA: Diagnosis not present

## 2018-06-21 DIAGNOSIS — D631 Anemia in chronic kidney disease: Secondary | ICD-10-CM | POA: Diagnosis not present

## 2018-06-21 DIAGNOSIS — N2581 Secondary hyperparathyroidism of renal origin: Secondary | ICD-10-CM | POA: Diagnosis not present

## 2018-06-21 DIAGNOSIS — N186 End stage renal disease: Secondary | ICD-10-CM | POA: Diagnosis not present

## 2018-06-24 DIAGNOSIS — N186 End stage renal disease: Secondary | ICD-10-CM | POA: Diagnosis not present

## 2018-06-24 DIAGNOSIS — D509 Iron deficiency anemia, unspecified: Secondary | ICD-10-CM | POA: Diagnosis not present

## 2018-06-24 DIAGNOSIS — Z992 Dependence on renal dialysis: Secondary | ICD-10-CM | POA: Diagnosis not present

## 2018-06-24 DIAGNOSIS — D631 Anemia in chronic kidney disease: Secondary | ICD-10-CM | POA: Diagnosis not present

## 2018-06-24 DIAGNOSIS — N2581 Secondary hyperparathyroidism of renal origin: Secondary | ICD-10-CM | POA: Diagnosis not present

## 2018-06-26 DIAGNOSIS — Z992 Dependence on renal dialysis: Secondary | ICD-10-CM | POA: Diagnosis not present

## 2018-06-26 DIAGNOSIS — N186 End stage renal disease: Secondary | ICD-10-CM | POA: Diagnosis not present

## 2018-06-26 DIAGNOSIS — D509 Iron deficiency anemia, unspecified: Secondary | ICD-10-CM | POA: Diagnosis not present

## 2018-06-26 DIAGNOSIS — N2581 Secondary hyperparathyroidism of renal origin: Secondary | ICD-10-CM | POA: Diagnosis not present

## 2018-06-26 DIAGNOSIS — D631 Anemia in chronic kidney disease: Secondary | ICD-10-CM | POA: Diagnosis not present

## 2018-06-28 DIAGNOSIS — D631 Anemia in chronic kidney disease: Secondary | ICD-10-CM | POA: Diagnosis not present

## 2018-06-28 DIAGNOSIS — N2581 Secondary hyperparathyroidism of renal origin: Secondary | ICD-10-CM | POA: Diagnosis not present

## 2018-06-28 DIAGNOSIS — Z992 Dependence on renal dialysis: Secondary | ICD-10-CM | POA: Diagnosis not present

## 2018-06-28 DIAGNOSIS — D509 Iron deficiency anemia, unspecified: Secondary | ICD-10-CM | POA: Diagnosis not present

## 2018-06-28 DIAGNOSIS — N186 End stage renal disease: Secondary | ICD-10-CM | POA: Diagnosis not present

## 2018-07-01 DIAGNOSIS — D509 Iron deficiency anemia, unspecified: Secondary | ICD-10-CM | POA: Diagnosis not present

## 2018-07-01 DIAGNOSIS — N2581 Secondary hyperparathyroidism of renal origin: Secondary | ICD-10-CM | POA: Diagnosis not present

## 2018-07-01 DIAGNOSIS — Z992 Dependence on renal dialysis: Secondary | ICD-10-CM | POA: Diagnosis not present

## 2018-07-01 DIAGNOSIS — N186 End stage renal disease: Secondary | ICD-10-CM | POA: Diagnosis not present

## 2018-07-01 DIAGNOSIS — D631 Anemia in chronic kidney disease: Secondary | ICD-10-CM | POA: Diagnosis not present

## 2018-07-03 DIAGNOSIS — N2581 Secondary hyperparathyroidism of renal origin: Secondary | ICD-10-CM | POA: Diagnosis not present

## 2018-07-03 DIAGNOSIS — D509 Iron deficiency anemia, unspecified: Secondary | ICD-10-CM | POA: Diagnosis not present

## 2018-07-03 DIAGNOSIS — Z992 Dependence on renal dialysis: Secondary | ICD-10-CM | POA: Diagnosis not present

## 2018-07-03 DIAGNOSIS — N186 End stage renal disease: Secondary | ICD-10-CM | POA: Diagnosis not present

## 2018-07-03 DIAGNOSIS — D631 Anemia in chronic kidney disease: Secondary | ICD-10-CM | POA: Diagnosis not present

## 2018-07-05 DIAGNOSIS — N186 End stage renal disease: Secondary | ICD-10-CM | POA: Diagnosis not present

## 2018-07-05 DIAGNOSIS — D631 Anemia in chronic kidney disease: Secondary | ICD-10-CM | POA: Diagnosis not present

## 2018-07-05 DIAGNOSIS — D509 Iron deficiency anemia, unspecified: Secondary | ICD-10-CM | POA: Diagnosis not present

## 2018-07-05 DIAGNOSIS — N2581 Secondary hyperparathyroidism of renal origin: Secondary | ICD-10-CM | POA: Diagnosis not present

## 2018-07-05 DIAGNOSIS — Z992 Dependence on renal dialysis: Secondary | ICD-10-CM | POA: Diagnosis not present

## 2018-07-08 DIAGNOSIS — N2581 Secondary hyperparathyroidism of renal origin: Secondary | ICD-10-CM | POA: Diagnosis not present

## 2018-07-08 DIAGNOSIS — I10 Essential (primary) hypertension: Secondary | ICD-10-CM | POA: Diagnosis not present

## 2018-07-08 DIAGNOSIS — S0990XA Unspecified injury of head, initial encounter: Secondary | ICD-10-CM | POA: Diagnosis not present

## 2018-07-08 DIAGNOSIS — Z79899 Other long term (current) drug therapy: Secondary | ICD-10-CM | POA: Diagnosis not present

## 2018-07-08 DIAGNOSIS — H547 Unspecified visual loss: Secondary | ICD-10-CM | POA: Diagnosis not present

## 2018-07-08 DIAGNOSIS — Z992 Dependence on renal dialysis: Secondary | ICD-10-CM | POA: Diagnosis not present

## 2018-07-08 DIAGNOSIS — Z7982 Long term (current) use of aspirin: Secondary | ICD-10-CM | POA: Diagnosis not present

## 2018-07-08 DIAGNOSIS — S0181XA Laceration without foreign body of other part of head, initial encounter: Secondary | ICD-10-CM | POA: Diagnosis not present

## 2018-07-08 DIAGNOSIS — E785 Hyperlipidemia, unspecified: Secondary | ICD-10-CM | POA: Diagnosis not present

## 2018-07-08 DIAGNOSIS — Z23 Encounter for immunization: Secondary | ICD-10-CM | POA: Diagnosis not present

## 2018-07-08 DIAGNOSIS — D509 Iron deficiency anemia, unspecified: Secondary | ICD-10-CM | POA: Diagnosis not present

## 2018-07-08 DIAGNOSIS — M25519 Pain in unspecified shoulder: Secondary | ICD-10-CM | POA: Diagnosis not present

## 2018-07-08 DIAGNOSIS — R52 Pain, unspecified: Secondary | ICD-10-CM | POA: Diagnosis not present

## 2018-07-08 DIAGNOSIS — N186 End stage renal disease: Secondary | ICD-10-CM | POA: Diagnosis not present

## 2018-07-08 DIAGNOSIS — I120 Hypertensive chronic kidney disease with stage 5 chronic kidney disease or end stage renal disease: Secondary | ICD-10-CM | POA: Diagnosis not present

## 2018-07-08 DIAGNOSIS — S0512XA Contusion of eyeball and orbital tissues, left eye, initial encounter: Secondary | ICD-10-CM | POA: Diagnosis not present

## 2018-07-08 DIAGNOSIS — W19XXXA Unspecified fall, initial encounter: Secondary | ICD-10-CM | POA: Diagnosis not present

## 2018-07-08 DIAGNOSIS — D631 Anemia in chronic kidney disease: Secondary | ICD-10-CM | POA: Diagnosis not present

## 2018-07-09 DIAGNOSIS — S0990XA Unspecified injury of head, initial encounter: Secondary | ICD-10-CM | POA: Diagnosis not present

## 2018-07-09 DIAGNOSIS — M542 Cervicalgia: Secondary | ICD-10-CM | POA: Diagnosis not present

## 2018-07-09 DIAGNOSIS — M25512 Pain in left shoulder: Secondary | ICD-10-CM | POA: Diagnosis not present

## 2018-07-09 DIAGNOSIS — K828 Other specified diseases of gallbladder: Secondary | ICD-10-CM | POA: Insufficient documentation

## 2018-07-09 DIAGNOSIS — I82402 Acute embolism and thrombosis of unspecified deep veins of left lower extremity: Secondary | ICD-10-CM | POA: Diagnosis not present

## 2018-07-09 DIAGNOSIS — W06XXXA Fall from bed, initial encounter: Secondary | ICD-10-CM | POA: Diagnosis not present

## 2018-07-09 DIAGNOSIS — S0993XA Unspecified injury of face, initial encounter: Secondary | ICD-10-CM | POA: Diagnosis not present

## 2018-07-09 DIAGNOSIS — Y999 Unspecified external cause status: Secondary | ICD-10-CM | POA: Diagnosis not present

## 2018-07-09 DIAGNOSIS — R1011 Right upper quadrant pain: Secondary | ICD-10-CM | POA: Diagnosis not present

## 2018-07-09 DIAGNOSIS — S0512XA Contusion of eyeball and orbital tissues, left eye, initial encounter: Secondary | ICD-10-CM | POA: Diagnosis not present

## 2018-07-09 DIAGNOSIS — M545 Low back pain: Secondary | ICD-10-CM | POA: Diagnosis not present

## 2018-07-09 DIAGNOSIS — S299XXA Unspecified injury of thorax, initial encounter: Secondary | ICD-10-CM | POA: Diagnosis not present

## 2018-07-09 DIAGNOSIS — M7989 Other specified soft tissue disorders: Secondary | ICD-10-CM | POA: Diagnosis not present

## 2018-07-09 DIAGNOSIS — M79605 Pain in left leg: Secondary | ICD-10-CM | POA: Diagnosis not present

## 2018-07-09 DIAGNOSIS — S0003XA Contusion of scalp, initial encounter: Secondary | ICD-10-CM | POA: Diagnosis not present

## 2018-07-09 DIAGNOSIS — S199XXA Unspecified injury of neck, initial encounter: Secondary | ICD-10-CM | POA: Diagnosis not present

## 2018-07-09 DIAGNOSIS — S058X2A Other injuries of left eye and orbit, initial encounter: Secondary | ICD-10-CM | POA: Diagnosis not present

## 2018-07-09 DIAGNOSIS — M25511 Pain in right shoulder: Secondary | ICD-10-CM | POA: Diagnosis not present

## 2018-07-09 DIAGNOSIS — S4991XA Unspecified injury of right shoulder and upper arm, initial encounter: Secondary | ICD-10-CM | POA: Diagnosis not present

## 2018-07-09 DIAGNOSIS — R220 Localized swelling, mass and lump, head: Secondary | ICD-10-CM | POA: Diagnosis not present

## 2018-07-09 DIAGNOSIS — S0083XA Contusion of other part of head, initial encounter: Secondary | ICD-10-CM | POA: Diagnosis not present

## 2018-07-09 DIAGNOSIS — S0181XA Laceration without foreign body of other part of head, initial encounter: Secondary | ICD-10-CM | POA: Diagnosis not present

## 2018-07-10 DIAGNOSIS — N2581 Secondary hyperparathyroidism of renal origin: Secondary | ICD-10-CM | POA: Diagnosis not present

## 2018-07-10 DIAGNOSIS — D509 Iron deficiency anemia, unspecified: Secondary | ICD-10-CM | POA: Diagnosis not present

## 2018-07-10 DIAGNOSIS — D631 Anemia in chronic kidney disease: Secondary | ICD-10-CM | POA: Diagnosis not present

## 2018-07-10 DIAGNOSIS — N186 End stage renal disease: Secondary | ICD-10-CM | POA: Diagnosis not present

## 2018-07-10 DIAGNOSIS — Z992 Dependence on renal dialysis: Secondary | ICD-10-CM | POA: Diagnosis not present

## 2018-07-12 DIAGNOSIS — Z992 Dependence on renal dialysis: Secondary | ICD-10-CM | POA: Diagnosis not present

## 2018-07-12 DIAGNOSIS — D631 Anemia in chronic kidney disease: Secondary | ICD-10-CM | POA: Diagnosis not present

## 2018-07-12 DIAGNOSIS — N186 End stage renal disease: Secondary | ICD-10-CM | POA: Diagnosis not present

## 2018-07-12 DIAGNOSIS — N2581 Secondary hyperparathyroidism of renal origin: Secondary | ICD-10-CM | POA: Diagnosis not present

## 2018-07-12 DIAGNOSIS — D509 Iron deficiency anemia, unspecified: Secondary | ICD-10-CM | POA: Diagnosis not present

## 2018-07-15 DIAGNOSIS — D631 Anemia in chronic kidney disease: Secondary | ICD-10-CM | POA: Diagnosis not present

## 2018-07-15 DIAGNOSIS — D509 Iron deficiency anemia, unspecified: Secondary | ICD-10-CM | POA: Diagnosis not present

## 2018-07-15 DIAGNOSIS — N2581 Secondary hyperparathyroidism of renal origin: Secondary | ICD-10-CM | POA: Diagnosis not present

## 2018-07-15 DIAGNOSIS — Z992 Dependence on renal dialysis: Secondary | ICD-10-CM | POA: Diagnosis not present

## 2018-07-15 DIAGNOSIS — N186 End stage renal disease: Secondary | ICD-10-CM | POA: Diagnosis not present

## 2018-07-17 DIAGNOSIS — N186 End stage renal disease: Secondary | ICD-10-CM | POA: Diagnosis not present

## 2018-07-17 DIAGNOSIS — Z992 Dependence on renal dialysis: Secondary | ICD-10-CM | POA: Diagnosis not present

## 2018-07-17 DIAGNOSIS — D631 Anemia in chronic kidney disease: Secondary | ICD-10-CM | POA: Diagnosis not present

## 2018-07-17 DIAGNOSIS — N2581 Secondary hyperparathyroidism of renal origin: Secondary | ICD-10-CM | POA: Diagnosis not present

## 2018-07-17 DIAGNOSIS — D509 Iron deficiency anemia, unspecified: Secondary | ICD-10-CM | POA: Diagnosis not present

## 2018-07-18 DIAGNOSIS — Z992 Dependence on renal dialysis: Secondary | ICD-10-CM | POA: Diagnosis not present

## 2018-07-18 DIAGNOSIS — N186 End stage renal disease: Secondary | ICD-10-CM | POA: Diagnosis not present

## 2018-07-19 DIAGNOSIS — Z992 Dependence on renal dialysis: Secondary | ICD-10-CM | POA: Diagnosis not present

## 2018-07-19 DIAGNOSIS — D509 Iron deficiency anemia, unspecified: Secondary | ICD-10-CM | POA: Diagnosis not present

## 2018-07-19 DIAGNOSIS — N2581 Secondary hyperparathyroidism of renal origin: Secondary | ICD-10-CM | POA: Diagnosis not present

## 2018-07-19 DIAGNOSIS — N186 End stage renal disease: Secondary | ICD-10-CM | POA: Diagnosis not present

## 2018-07-19 DIAGNOSIS — D631 Anemia in chronic kidney disease: Secondary | ICD-10-CM | POA: Diagnosis not present

## 2018-07-22 DIAGNOSIS — N2581 Secondary hyperparathyroidism of renal origin: Secondary | ICD-10-CM | POA: Diagnosis not present

## 2018-07-22 DIAGNOSIS — Z23 Encounter for immunization: Secondary | ICD-10-CM | POA: Diagnosis not present

## 2018-07-22 DIAGNOSIS — D631 Anemia in chronic kidney disease: Secondary | ICD-10-CM | POA: Diagnosis not present

## 2018-07-22 DIAGNOSIS — Z992 Dependence on renal dialysis: Secondary | ICD-10-CM | POA: Diagnosis not present

## 2018-07-22 DIAGNOSIS — D509 Iron deficiency anemia, unspecified: Secondary | ICD-10-CM | POA: Diagnosis not present

## 2018-07-22 DIAGNOSIS — N186 End stage renal disease: Secondary | ICD-10-CM | POA: Diagnosis not present

## 2018-07-24 DIAGNOSIS — D631 Anemia in chronic kidney disease: Secondary | ICD-10-CM | POA: Diagnosis not present

## 2018-07-24 DIAGNOSIS — D509 Iron deficiency anemia, unspecified: Secondary | ICD-10-CM | POA: Diagnosis not present

## 2018-07-24 DIAGNOSIS — Z23 Encounter for immunization: Secondary | ICD-10-CM | POA: Diagnosis not present

## 2018-07-24 DIAGNOSIS — N2581 Secondary hyperparathyroidism of renal origin: Secondary | ICD-10-CM | POA: Diagnosis not present

## 2018-07-24 DIAGNOSIS — Z992 Dependence on renal dialysis: Secondary | ICD-10-CM | POA: Diagnosis not present

## 2018-07-24 DIAGNOSIS — N186 End stage renal disease: Secondary | ICD-10-CM | POA: Diagnosis not present

## 2018-07-25 DIAGNOSIS — Z23 Encounter for immunization: Secondary | ICD-10-CM | POA: Diagnosis not present

## 2018-07-25 DIAGNOSIS — N186 End stage renal disease: Secondary | ICD-10-CM | POA: Diagnosis not present

## 2018-07-25 DIAGNOSIS — D509 Iron deficiency anemia, unspecified: Secondary | ICD-10-CM | POA: Diagnosis not present

## 2018-07-25 DIAGNOSIS — N2581 Secondary hyperparathyroidism of renal origin: Secondary | ICD-10-CM | POA: Diagnosis not present

## 2018-07-25 DIAGNOSIS — Z992 Dependence on renal dialysis: Secondary | ICD-10-CM | POA: Diagnosis not present

## 2018-07-25 DIAGNOSIS — D631 Anemia in chronic kidney disease: Secondary | ICD-10-CM | POA: Diagnosis not present

## 2018-07-28 DIAGNOSIS — D631 Anemia in chronic kidney disease: Secondary | ICD-10-CM | POA: Diagnosis not present

## 2018-07-28 DIAGNOSIS — D509 Iron deficiency anemia, unspecified: Secondary | ICD-10-CM | POA: Diagnosis not present

## 2018-07-28 DIAGNOSIS — N2581 Secondary hyperparathyroidism of renal origin: Secondary | ICD-10-CM | POA: Diagnosis not present

## 2018-07-28 DIAGNOSIS — Z992 Dependence on renal dialysis: Secondary | ICD-10-CM | POA: Diagnosis not present

## 2018-07-28 DIAGNOSIS — Z23 Encounter for immunization: Secondary | ICD-10-CM | POA: Diagnosis not present

## 2018-07-28 DIAGNOSIS — N186 End stage renal disease: Secondary | ICD-10-CM | POA: Diagnosis not present

## 2018-07-31 DIAGNOSIS — Z23 Encounter for immunization: Secondary | ICD-10-CM | POA: Diagnosis not present

## 2018-07-31 DIAGNOSIS — D631 Anemia in chronic kidney disease: Secondary | ICD-10-CM | POA: Diagnosis not present

## 2018-07-31 DIAGNOSIS — D509 Iron deficiency anemia, unspecified: Secondary | ICD-10-CM | POA: Diagnosis not present

## 2018-07-31 DIAGNOSIS — N186 End stage renal disease: Secondary | ICD-10-CM | POA: Diagnosis not present

## 2018-07-31 DIAGNOSIS — Z992 Dependence on renal dialysis: Secondary | ICD-10-CM | POA: Diagnosis not present

## 2018-07-31 DIAGNOSIS — N2581 Secondary hyperparathyroidism of renal origin: Secondary | ICD-10-CM | POA: Diagnosis not present

## 2018-08-01 ENCOUNTER — Encounter (INDEPENDENT_AMBULATORY_CARE_PROVIDER_SITE_OTHER): Payer: Medicare Other

## 2018-08-01 ENCOUNTER — Ambulatory Visit (INDEPENDENT_AMBULATORY_CARE_PROVIDER_SITE_OTHER): Payer: Medicare Other | Admitting: Vascular Surgery

## 2018-08-02 DIAGNOSIS — N2581 Secondary hyperparathyroidism of renal origin: Secondary | ICD-10-CM | POA: Diagnosis not present

## 2018-08-02 DIAGNOSIS — Z23 Encounter for immunization: Secondary | ICD-10-CM | POA: Diagnosis not present

## 2018-08-02 DIAGNOSIS — Z992 Dependence on renal dialysis: Secondary | ICD-10-CM | POA: Diagnosis not present

## 2018-08-02 DIAGNOSIS — N186 End stage renal disease: Secondary | ICD-10-CM | POA: Diagnosis not present

## 2018-08-02 DIAGNOSIS — D509 Iron deficiency anemia, unspecified: Secondary | ICD-10-CM | POA: Diagnosis not present

## 2018-08-02 DIAGNOSIS — D631 Anemia in chronic kidney disease: Secondary | ICD-10-CM | POA: Diagnosis not present

## 2018-08-05 DIAGNOSIS — D631 Anemia in chronic kidney disease: Secondary | ICD-10-CM | POA: Diagnosis not present

## 2018-08-05 DIAGNOSIS — D509 Iron deficiency anemia, unspecified: Secondary | ICD-10-CM | POA: Diagnosis not present

## 2018-08-05 DIAGNOSIS — N186 End stage renal disease: Secondary | ICD-10-CM | POA: Diagnosis not present

## 2018-08-05 DIAGNOSIS — Z23 Encounter for immunization: Secondary | ICD-10-CM | POA: Diagnosis not present

## 2018-08-05 DIAGNOSIS — Z992 Dependence on renal dialysis: Secondary | ICD-10-CM | POA: Diagnosis not present

## 2018-08-05 DIAGNOSIS — N2581 Secondary hyperparathyroidism of renal origin: Secondary | ICD-10-CM | POA: Diagnosis not present

## 2018-08-07 DIAGNOSIS — N2581 Secondary hyperparathyroidism of renal origin: Secondary | ICD-10-CM | POA: Diagnosis not present

## 2018-08-07 DIAGNOSIS — Z992 Dependence on renal dialysis: Secondary | ICD-10-CM | POA: Diagnosis not present

## 2018-08-07 DIAGNOSIS — N186 End stage renal disease: Secondary | ICD-10-CM | POA: Diagnosis not present

## 2018-08-07 DIAGNOSIS — D631 Anemia in chronic kidney disease: Secondary | ICD-10-CM | POA: Diagnosis not present

## 2018-08-07 DIAGNOSIS — Z23 Encounter for immunization: Secondary | ICD-10-CM | POA: Diagnosis not present

## 2018-08-07 DIAGNOSIS — D509 Iron deficiency anemia, unspecified: Secondary | ICD-10-CM | POA: Diagnosis not present

## 2018-08-09 DIAGNOSIS — D509 Iron deficiency anemia, unspecified: Secondary | ICD-10-CM | POA: Diagnosis not present

## 2018-08-09 DIAGNOSIS — N186 End stage renal disease: Secondary | ICD-10-CM | POA: Diagnosis not present

## 2018-08-09 DIAGNOSIS — Z992 Dependence on renal dialysis: Secondary | ICD-10-CM | POA: Diagnosis not present

## 2018-08-09 DIAGNOSIS — D631 Anemia in chronic kidney disease: Secondary | ICD-10-CM | POA: Diagnosis not present

## 2018-08-09 DIAGNOSIS — Z23 Encounter for immunization: Secondary | ICD-10-CM | POA: Diagnosis not present

## 2018-08-09 DIAGNOSIS — N2581 Secondary hyperparathyroidism of renal origin: Secondary | ICD-10-CM | POA: Diagnosis not present

## 2018-08-12 DIAGNOSIS — Z23 Encounter for immunization: Secondary | ICD-10-CM | POA: Diagnosis not present

## 2018-08-12 DIAGNOSIS — D631 Anemia in chronic kidney disease: Secondary | ICD-10-CM | POA: Diagnosis not present

## 2018-08-12 DIAGNOSIS — N2581 Secondary hyperparathyroidism of renal origin: Secondary | ICD-10-CM | POA: Diagnosis not present

## 2018-08-12 DIAGNOSIS — N186 End stage renal disease: Secondary | ICD-10-CM | POA: Diagnosis not present

## 2018-08-12 DIAGNOSIS — D509 Iron deficiency anemia, unspecified: Secondary | ICD-10-CM | POA: Diagnosis not present

## 2018-08-12 DIAGNOSIS — Z992 Dependence on renal dialysis: Secondary | ICD-10-CM | POA: Diagnosis not present

## 2018-08-14 DIAGNOSIS — N2581 Secondary hyperparathyroidism of renal origin: Secondary | ICD-10-CM | POA: Diagnosis not present

## 2018-08-14 DIAGNOSIS — Z992 Dependence on renal dialysis: Secondary | ICD-10-CM | POA: Diagnosis not present

## 2018-08-14 DIAGNOSIS — Z23 Encounter for immunization: Secondary | ICD-10-CM | POA: Diagnosis not present

## 2018-08-14 DIAGNOSIS — N186 End stage renal disease: Secondary | ICD-10-CM | POA: Diagnosis not present

## 2018-08-14 DIAGNOSIS — D509 Iron deficiency anemia, unspecified: Secondary | ICD-10-CM | POA: Diagnosis not present

## 2018-08-14 DIAGNOSIS — D631 Anemia in chronic kidney disease: Secondary | ICD-10-CM | POA: Diagnosis not present

## 2018-08-16 DIAGNOSIS — Z992 Dependence on renal dialysis: Secondary | ICD-10-CM | POA: Diagnosis not present

## 2018-08-16 DIAGNOSIS — N2581 Secondary hyperparathyroidism of renal origin: Secondary | ICD-10-CM | POA: Diagnosis not present

## 2018-08-16 DIAGNOSIS — Z23 Encounter for immunization: Secondary | ICD-10-CM | POA: Diagnosis not present

## 2018-08-16 DIAGNOSIS — N186 End stage renal disease: Secondary | ICD-10-CM | POA: Diagnosis not present

## 2018-08-16 DIAGNOSIS — D631 Anemia in chronic kidney disease: Secondary | ICD-10-CM | POA: Diagnosis not present

## 2018-08-16 DIAGNOSIS — D509 Iron deficiency anemia, unspecified: Secondary | ICD-10-CM | POA: Diagnosis not present

## 2018-08-18 DIAGNOSIS — Z992 Dependence on renal dialysis: Secondary | ICD-10-CM | POA: Diagnosis not present

## 2018-08-18 DIAGNOSIS — N186 End stage renal disease: Secondary | ICD-10-CM | POA: Diagnosis not present

## 2018-08-19 DIAGNOSIS — D509 Iron deficiency anemia, unspecified: Secondary | ICD-10-CM | POA: Diagnosis not present

## 2018-08-19 DIAGNOSIS — D631 Anemia in chronic kidney disease: Secondary | ICD-10-CM | POA: Diagnosis not present

## 2018-08-19 DIAGNOSIS — Z992 Dependence on renal dialysis: Secondary | ICD-10-CM | POA: Diagnosis not present

## 2018-08-19 DIAGNOSIS — N2581 Secondary hyperparathyroidism of renal origin: Secondary | ICD-10-CM | POA: Diagnosis not present

## 2018-08-19 DIAGNOSIS — N186 End stage renal disease: Secondary | ICD-10-CM | POA: Diagnosis not present

## 2018-08-21 DIAGNOSIS — D631 Anemia in chronic kidney disease: Secondary | ICD-10-CM | POA: Diagnosis not present

## 2018-08-21 DIAGNOSIS — D509 Iron deficiency anemia, unspecified: Secondary | ICD-10-CM | POA: Diagnosis not present

## 2018-08-21 DIAGNOSIS — N2581 Secondary hyperparathyroidism of renal origin: Secondary | ICD-10-CM | POA: Diagnosis not present

## 2018-08-21 DIAGNOSIS — Z992 Dependence on renal dialysis: Secondary | ICD-10-CM | POA: Diagnosis not present

## 2018-08-21 DIAGNOSIS — N186 End stage renal disease: Secondary | ICD-10-CM | POA: Diagnosis not present

## 2018-08-23 DIAGNOSIS — Z992 Dependence on renal dialysis: Secondary | ICD-10-CM | POA: Diagnosis not present

## 2018-08-23 DIAGNOSIS — D631 Anemia in chronic kidney disease: Secondary | ICD-10-CM | POA: Diagnosis not present

## 2018-08-23 DIAGNOSIS — N2581 Secondary hyperparathyroidism of renal origin: Secondary | ICD-10-CM | POA: Diagnosis not present

## 2018-08-23 DIAGNOSIS — N186 End stage renal disease: Secondary | ICD-10-CM | POA: Diagnosis not present

## 2018-08-23 DIAGNOSIS — D509 Iron deficiency anemia, unspecified: Secondary | ICD-10-CM | POA: Diagnosis not present

## 2018-08-25 DIAGNOSIS — E7800 Pure hypercholesterolemia, unspecified: Secondary | ICD-10-CM | POA: Diagnosis not present

## 2018-08-25 DIAGNOSIS — I10 Essential (primary) hypertension: Secondary | ICD-10-CM | POA: Diagnosis not present

## 2018-08-26 DIAGNOSIS — N186 End stage renal disease: Secondary | ICD-10-CM | POA: Diagnosis not present

## 2018-08-26 DIAGNOSIS — D509 Iron deficiency anemia, unspecified: Secondary | ICD-10-CM | POA: Diagnosis not present

## 2018-08-26 DIAGNOSIS — Z992 Dependence on renal dialysis: Secondary | ICD-10-CM | POA: Diagnosis not present

## 2018-08-26 DIAGNOSIS — N2581 Secondary hyperparathyroidism of renal origin: Secondary | ICD-10-CM | POA: Diagnosis not present

## 2018-08-26 DIAGNOSIS — D631 Anemia in chronic kidney disease: Secondary | ICD-10-CM | POA: Diagnosis not present

## 2018-08-28 DIAGNOSIS — D631 Anemia in chronic kidney disease: Secondary | ICD-10-CM | POA: Diagnosis not present

## 2018-08-28 DIAGNOSIS — N2581 Secondary hyperparathyroidism of renal origin: Secondary | ICD-10-CM | POA: Diagnosis not present

## 2018-08-28 DIAGNOSIS — N186 End stage renal disease: Secondary | ICD-10-CM | POA: Diagnosis not present

## 2018-08-28 DIAGNOSIS — Z992 Dependence on renal dialysis: Secondary | ICD-10-CM | POA: Diagnosis not present

## 2018-08-28 DIAGNOSIS — D509 Iron deficiency anemia, unspecified: Secondary | ICD-10-CM | POA: Diagnosis not present

## 2018-08-30 DIAGNOSIS — N2581 Secondary hyperparathyroidism of renal origin: Secondary | ICD-10-CM | POA: Diagnosis not present

## 2018-08-30 DIAGNOSIS — D509 Iron deficiency anemia, unspecified: Secondary | ICD-10-CM | POA: Diagnosis not present

## 2018-08-30 DIAGNOSIS — Z992 Dependence on renal dialysis: Secondary | ICD-10-CM | POA: Diagnosis not present

## 2018-08-30 DIAGNOSIS — N186 End stage renal disease: Secondary | ICD-10-CM | POA: Diagnosis not present

## 2018-08-30 DIAGNOSIS — D631 Anemia in chronic kidney disease: Secondary | ICD-10-CM | POA: Diagnosis not present

## 2018-09-02 DIAGNOSIS — D509 Iron deficiency anemia, unspecified: Secondary | ICD-10-CM | POA: Diagnosis not present

## 2018-09-02 DIAGNOSIS — N186 End stage renal disease: Secondary | ICD-10-CM | POA: Diagnosis not present

## 2018-09-02 DIAGNOSIS — N2581 Secondary hyperparathyroidism of renal origin: Secondary | ICD-10-CM | POA: Diagnosis not present

## 2018-09-02 DIAGNOSIS — Z992 Dependence on renal dialysis: Secondary | ICD-10-CM | POA: Diagnosis not present

## 2018-09-02 DIAGNOSIS — D631 Anemia in chronic kidney disease: Secondary | ICD-10-CM | POA: Diagnosis not present

## 2018-09-04 DIAGNOSIS — Z992 Dependence on renal dialysis: Secondary | ICD-10-CM | POA: Diagnosis not present

## 2018-09-04 DIAGNOSIS — N186 End stage renal disease: Secondary | ICD-10-CM | POA: Diagnosis not present

## 2018-09-04 DIAGNOSIS — N2581 Secondary hyperparathyroidism of renal origin: Secondary | ICD-10-CM | POA: Diagnosis not present

## 2018-09-04 DIAGNOSIS — D509 Iron deficiency anemia, unspecified: Secondary | ICD-10-CM | POA: Diagnosis not present

## 2018-09-04 DIAGNOSIS — D631 Anemia in chronic kidney disease: Secondary | ICD-10-CM | POA: Diagnosis not present

## 2018-09-06 DIAGNOSIS — D631 Anemia in chronic kidney disease: Secondary | ICD-10-CM | POA: Diagnosis not present

## 2018-09-06 DIAGNOSIS — Z992 Dependence on renal dialysis: Secondary | ICD-10-CM | POA: Diagnosis not present

## 2018-09-06 DIAGNOSIS — N2581 Secondary hyperparathyroidism of renal origin: Secondary | ICD-10-CM | POA: Diagnosis not present

## 2018-09-06 DIAGNOSIS — D509 Iron deficiency anemia, unspecified: Secondary | ICD-10-CM | POA: Diagnosis not present

## 2018-09-06 DIAGNOSIS — N186 End stage renal disease: Secondary | ICD-10-CM | POA: Diagnosis not present

## 2018-09-09 DIAGNOSIS — N2581 Secondary hyperparathyroidism of renal origin: Secondary | ICD-10-CM | POA: Diagnosis not present

## 2018-09-09 DIAGNOSIS — N186 End stage renal disease: Secondary | ICD-10-CM | POA: Diagnosis not present

## 2018-09-09 DIAGNOSIS — Z992 Dependence on renal dialysis: Secondary | ICD-10-CM | POA: Diagnosis not present

## 2018-09-09 DIAGNOSIS — D509 Iron deficiency anemia, unspecified: Secondary | ICD-10-CM | POA: Diagnosis not present

## 2018-09-09 DIAGNOSIS — D631 Anemia in chronic kidney disease: Secondary | ICD-10-CM | POA: Diagnosis not present

## 2018-09-11 DIAGNOSIS — Z992 Dependence on renal dialysis: Secondary | ICD-10-CM | POA: Diagnosis not present

## 2018-09-11 DIAGNOSIS — N2581 Secondary hyperparathyroidism of renal origin: Secondary | ICD-10-CM | POA: Diagnosis not present

## 2018-09-11 DIAGNOSIS — D631 Anemia in chronic kidney disease: Secondary | ICD-10-CM | POA: Diagnosis not present

## 2018-09-11 DIAGNOSIS — D509 Iron deficiency anemia, unspecified: Secondary | ICD-10-CM | POA: Diagnosis not present

## 2018-09-11 DIAGNOSIS — N186 End stage renal disease: Secondary | ICD-10-CM | POA: Diagnosis not present

## 2018-09-13 DIAGNOSIS — N2581 Secondary hyperparathyroidism of renal origin: Secondary | ICD-10-CM | POA: Diagnosis not present

## 2018-09-13 DIAGNOSIS — D631 Anemia in chronic kidney disease: Secondary | ICD-10-CM | POA: Diagnosis not present

## 2018-09-13 DIAGNOSIS — N186 End stage renal disease: Secondary | ICD-10-CM | POA: Diagnosis not present

## 2018-09-13 DIAGNOSIS — Z992 Dependence on renal dialysis: Secondary | ICD-10-CM | POA: Diagnosis not present

## 2018-09-13 DIAGNOSIS — D509 Iron deficiency anemia, unspecified: Secondary | ICD-10-CM | POA: Diagnosis not present

## 2018-09-16 DIAGNOSIS — Z992 Dependence on renal dialysis: Secondary | ICD-10-CM | POA: Diagnosis not present

## 2018-09-16 DIAGNOSIS — D631 Anemia in chronic kidney disease: Secondary | ICD-10-CM | POA: Diagnosis not present

## 2018-09-16 DIAGNOSIS — N186 End stage renal disease: Secondary | ICD-10-CM | POA: Diagnosis not present

## 2018-09-16 DIAGNOSIS — D509 Iron deficiency anemia, unspecified: Secondary | ICD-10-CM | POA: Diagnosis not present

## 2018-09-16 DIAGNOSIS — N2581 Secondary hyperparathyroidism of renal origin: Secondary | ICD-10-CM | POA: Diagnosis not present

## 2018-09-18 DIAGNOSIS — N2581 Secondary hyperparathyroidism of renal origin: Secondary | ICD-10-CM | POA: Diagnosis not present

## 2018-09-18 DIAGNOSIS — D631 Anemia in chronic kidney disease: Secondary | ICD-10-CM | POA: Diagnosis not present

## 2018-09-18 DIAGNOSIS — Z992 Dependence on renal dialysis: Secondary | ICD-10-CM | POA: Diagnosis not present

## 2018-09-18 DIAGNOSIS — N186 End stage renal disease: Secondary | ICD-10-CM | POA: Diagnosis not present

## 2018-09-18 DIAGNOSIS — D509 Iron deficiency anemia, unspecified: Secondary | ICD-10-CM | POA: Diagnosis not present

## 2018-09-20 DIAGNOSIS — D509 Iron deficiency anemia, unspecified: Secondary | ICD-10-CM | POA: Diagnosis not present

## 2018-09-20 DIAGNOSIS — D631 Anemia in chronic kidney disease: Secondary | ICD-10-CM | POA: Diagnosis not present

## 2018-09-20 DIAGNOSIS — Z992 Dependence on renal dialysis: Secondary | ICD-10-CM | POA: Diagnosis not present

## 2018-09-20 DIAGNOSIS — N186 End stage renal disease: Secondary | ICD-10-CM | POA: Diagnosis not present

## 2018-09-20 DIAGNOSIS — N2581 Secondary hyperparathyroidism of renal origin: Secondary | ICD-10-CM | POA: Diagnosis not present

## 2018-09-23 DIAGNOSIS — N186 End stage renal disease: Secondary | ICD-10-CM | POA: Diagnosis not present

## 2018-09-23 DIAGNOSIS — D631 Anemia in chronic kidney disease: Secondary | ICD-10-CM | POA: Diagnosis not present

## 2018-09-23 DIAGNOSIS — D509 Iron deficiency anemia, unspecified: Secondary | ICD-10-CM | POA: Diagnosis not present

## 2018-09-23 DIAGNOSIS — Z992 Dependence on renal dialysis: Secondary | ICD-10-CM | POA: Diagnosis not present

## 2018-09-23 DIAGNOSIS — N2581 Secondary hyperparathyroidism of renal origin: Secondary | ICD-10-CM | POA: Diagnosis not present

## 2018-09-25 DIAGNOSIS — D631 Anemia in chronic kidney disease: Secondary | ICD-10-CM | POA: Diagnosis not present

## 2018-09-25 DIAGNOSIS — Z992 Dependence on renal dialysis: Secondary | ICD-10-CM | POA: Diagnosis not present

## 2018-09-25 DIAGNOSIS — N2581 Secondary hyperparathyroidism of renal origin: Secondary | ICD-10-CM | POA: Diagnosis not present

## 2018-09-25 DIAGNOSIS — N186 End stage renal disease: Secondary | ICD-10-CM | POA: Diagnosis not present

## 2018-09-25 DIAGNOSIS — D509 Iron deficiency anemia, unspecified: Secondary | ICD-10-CM | POA: Diagnosis not present

## 2018-09-27 DIAGNOSIS — N186 End stage renal disease: Secondary | ICD-10-CM | POA: Diagnosis not present

## 2018-09-27 DIAGNOSIS — N2581 Secondary hyperparathyroidism of renal origin: Secondary | ICD-10-CM | POA: Diagnosis not present

## 2018-09-27 DIAGNOSIS — D509 Iron deficiency anemia, unspecified: Secondary | ICD-10-CM | POA: Diagnosis not present

## 2018-09-27 DIAGNOSIS — D631 Anemia in chronic kidney disease: Secondary | ICD-10-CM | POA: Diagnosis not present

## 2018-09-27 DIAGNOSIS — Z992 Dependence on renal dialysis: Secondary | ICD-10-CM | POA: Diagnosis not present

## 2018-09-30 DIAGNOSIS — N186 End stage renal disease: Secondary | ICD-10-CM | POA: Diagnosis not present

## 2018-09-30 DIAGNOSIS — Z992 Dependence on renal dialysis: Secondary | ICD-10-CM | POA: Diagnosis not present

## 2018-09-30 DIAGNOSIS — D509 Iron deficiency anemia, unspecified: Secondary | ICD-10-CM | POA: Diagnosis not present

## 2018-09-30 DIAGNOSIS — D631 Anemia in chronic kidney disease: Secondary | ICD-10-CM | POA: Diagnosis not present

## 2018-09-30 DIAGNOSIS — N2581 Secondary hyperparathyroidism of renal origin: Secondary | ICD-10-CM | POA: Diagnosis not present

## 2018-10-01 DIAGNOSIS — E7800 Pure hypercholesterolemia, unspecified: Secondary | ICD-10-CM | POA: Diagnosis not present

## 2018-10-01 DIAGNOSIS — I10 Essential (primary) hypertension: Secondary | ICD-10-CM | POA: Diagnosis not present

## 2018-10-02 DIAGNOSIS — D631 Anemia in chronic kidney disease: Secondary | ICD-10-CM | POA: Diagnosis not present

## 2018-10-02 DIAGNOSIS — N2581 Secondary hyperparathyroidism of renal origin: Secondary | ICD-10-CM | POA: Diagnosis not present

## 2018-10-02 DIAGNOSIS — Z992 Dependence on renal dialysis: Secondary | ICD-10-CM | POA: Diagnosis not present

## 2018-10-02 DIAGNOSIS — D509 Iron deficiency anemia, unspecified: Secondary | ICD-10-CM | POA: Diagnosis not present

## 2018-10-02 DIAGNOSIS — N186 End stage renal disease: Secondary | ICD-10-CM | POA: Diagnosis not present

## 2018-10-04 DIAGNOSIS — D631 Anemia in chronic kidney disease: Secondary | ICD-10-CM | POA: Diagnosis not present

## 2018-10-04 DIAGNOSIS — Z992 Dependence on renal dialysis: Secondary | ICD-10-CM | POA: Diagnosis not present

## 2018-10-04 DIAGNOSIS — N2581 Secondary hyperparathyroidism of renal origin: Secondary | ICD-10-CM | POA: Diagnosis not present

## 2018-10-04 DIAGNOSIS — D509 Iron deficiency anemia, unspecified: Secondary | ICD-10-CM | POA: Diagnosis not present

## 2018-10-04 DIAGNOSIS — N186 End stage renal disease: Secondary | ICD-10-CM | POA: Diagnosis not present

## 2018-10-07 DIAGNOSIS — Z992 Dependence on renal dialysis: Secondary | ICD-10-CM | POA: Diagnosis not present

## 2018-10-07 DIAGNOSIS — N186 End stage renal disease: Secondary | ICD-10-CM | POA: Diagnosis not present

## 2018-10-07 DIAGNOSIS — D509 Iron deficiency anemia, unspecified: Secondary | ICD-10-CM | POA: Diagnosis not present

## 2018-10-07 DIAGNOSIS — D631 Anemia in chronic kidney disease: Secondary | ICD-10-CM | POA: Diagnosis not present

## 2018-10-07 DIAGNOSIS — N2581 Secondary hyperparathyroidism of renal origin: Secondary | ICD-10-CM | POA: Diagnosis not present

## 2018-10-09 DIAGNOSIS — D631 Anemia in chronic kidney disease: Secondary | ICD-10-CM | POA: Diagnosis not present

## 2018-10-09 DIAGNOSIS — Z992 Dependence on renal dialysis: Secondary | ICD-10-CM | POA: Diagnosis not present

## 2018-10-09 DIAGNOSIS — D509 Iron deficiency anemia, unspecified: Secondary | ICD-10-CM | POA: Diagnosis not present

## 2018-10-09 DIAGNOSIS — N2581 Secondary hyperparathyroidism of renal origin: Secondary | ICD-10-CM | POA: Diagnosis not present

## 2018-10-09 DIAGNOSIS — N186 End stage renal disease: Secondary | ICD-10-CM | POA: Diagnosis not present

## 2018-10-11 DIAGNOSIS — D509 Iron deficiency anemia, unspecified: Secondary | ICD-10-CM | POA: Diagnosis not present

## 2018-10-11 DIAGNOSIS — N2581 Secondary hyperparathyroidism of renal origin: Secondary | ICD-10-CM | POA: Diagnosis not present

## 2018-10-11 DIAGNOSIS — Z992 Dependence on renal dialysis: Secondary | ICD-10-CM | POA: Diagnosis not present

## 2018-10-11 DIAGNOSIS — N186 End stage renal disease: Secondary | ICD-10-CM | POA: Diagnosis not present

## 2018-10-11 DIAGNOSIS — D631 Anemia in chronic kidney disease: Secondary | ICD-10-CM | POA: Diagnosis not present

## 2018-10-14 DIAGNOSIS — Z992 Dependence on renal dialysis: Secondary | ICD-10-CM | POA: Diagnosis not present

## 2018-10-14 DIAGNOSIS — D631 Anemia in chronic kidney disease: Secondary | ICD-10-CM | POA: Diagnosis not present

## 2018-10-14 DIAGNOSIS — N2581 Secondary hyperparathyroidism of renal origin: Secondary | ICD-10-CM | POA: Diagnosis not present

## 2018-10-14 DIAGNOSIS — D509 Iron deficiency anemia, unspecified: Secondary | ICD-10-CM | POA: Diagnosis not present

## 2018-10-14 DIAGNOSIS — N186 End stage renal disease: Secondary | ICD-10-CM | POA: Diagnosis not present

## 2018-10-16 DIAGNOSIS — N2581 Secondary hyperparathyroidism of renal origin: Secondary | ICD-10-CM | POA: Diagnosis not present

## 2018-10-16 DIAGNOSIS — D509 Iron deficiency anemia, unspecified: Secondary | ICD-10-CM | POA: Diagnosis not present

## 2018-10-16 DIAGNOSIS — D631 Anemia in chronic kidney disease: Secondary | ICD-10-CM | POA: Diagnosis not present

## 2018-10-16 DIAGNOSIS — N186 End stage renal disease: Secondary | ICD-10-CM | POA: Diagnosis not present

## 2018-10-16 DIAGNOSIS — Z992 Dependence on renal dialysis: Secondary | ICD-10-CM | POA: Diagnosis not present

## 2018-10-18 DIAGNOSIS — D631 Anemia in chronic kidney disease: Secondary | ICD-10-CM | POA: Diagnosis not present

## 2018-10-18 DIAGNOSIS — Z992 Dependence on renal dialysis: Secondary | ICD-10-CM | POA: Diagnosis not present

## 2018-10-18 DIAGNOSIS — N186 End stage renal disease: Secondary | ICD-10-CM | POA: Diagnosis not present

## 2018-10-18 DIAGNOSIS — D509 Iron deficiency anemia, unspecified: Secondary | ICD-10-CM | POA: Diagnosis not present

## 2018-10-18 DIAGNOSIS — N2581 Secondary hyperparathyroidism of renal origin: Secondary | ICD-10-CM | POA: Diagnosis not present

## 2018-10-21 DIAGNOSIS — N2581 Secondary hyperparathyroidism of renal origin: Secondary | ICD-10-CM | POA: Diagnosis not present

## 2018-10-21 DIAGNOSIS — D631 Anemia in chronic kidney disease: Secondary | ICD-10-CM | POA: Diagnosis not present

## 2018-10-21 DIAGNOSIS — Z992 Dependence on renal dialysis: Secondary | ICD-10-CM | POA: Diagnosis not present

## 2018-10-21 DIAGNOSIS — N186 End stage renal disease: Secondary | ICD-10-CM | POA: Diagnosis not present

## 2018-10-21 DIAGNOSIS — D509 Iron deficiency anemia, unspecified: Secondary | ICD-10-CM | POA: Diagnosis not present

## 2018-10-23 DIAGNOSIS — N2581 Secondary hyperparathyroidism of renal origin: Secondary | ICD-10-CM | POA: Diagnosis not present

## 2018-10-23 DIAGNOSIS — Z992 Dependence on renal dialysis: Secondary | ICD-10-CM | POA: Diagnosis not present

## 2018-10-23 DIAGNOSIS — D631 Anemia in chronic kidney disease: Secondary | ICD-10-CM | POA: Diagnosis not present

## 2018-10-23 DIAGNOSIS — D509 Iron deficiency anemia, unspecified: Secondary | ICD-10-CM | POA: Diagnosis not present

## 2018-10-23 DIAGNOSIS — N186 End stage renal disease: Secondary | ICD-10-CM | POA: Diagnosis not present

## 2018-10-25 DIAGNOSIS — N186 End stage renal disease: Secondary | ICD-10-CM | POA: Diagnosis not present

## 2018-10-25 DIAGNOSIS — D631 Anemia in chronic kidney disease: Secondary | ICD-10-CM | POA: Diagnosis not present

## 2018-10-25 DIAGNOSIS — D509 Iron deficiency anemia, unspecified: Secondary | ICD-10-CM | POA: Diagnosis not present

## 2018-10-25 DIAGNOSIS — N2581 Secondary hyperparathyroidism of renal origin: Secondary | ICD-10-CM | POA: Diagnosis not present

## 2018-10-25 DIAGNOSIS — Z992 Dependence on renal dialysis: Secondary | ICD-10-CM | POA: Diagnosis not present

## 2018-10-28 DIAGNOSIS — D631 Anemia in chronic kidney disease: Secondary | ICD-10-CM | POA: Diagnosis not present

## 2018-10-28 DIAGNOSIS — N186 End stage renal disease: Secondary | ICD-10-CM | POA: Diagnosis not present

## 2018-10-28 DIAGNOSIS — N2581 Secondary hyperparathyroidism of renal origin: Secondary | ICD-10-CM | POA: Diagnosis not present

## 2018-10-28 DIAGNOSIS — Z992 Dependence on renal dialysis: Secondary | ICD-10-CM | POA: Diagnosis not present

## 2018-10-28 DIAGNOSIS — D509 Iron deficiency anemia, unspecified: Secondary | ICD-10-CM | POA: Diagnosis not present

## 2018-10-29 DIAGNOSIS — H35033 Hypertensive retinopathy, bilateral: Secondary | ICD-10-CM | POA: Diagnosis not present

## 2018-10-30 DIAGNOSIS — D509 Iron deficiency anemia, unspecified: Secondary | ICD-10-CM | POA: Diagnosis not present

## 2018-10-30 DIAGNOSIS — D631 Anemia in chronic kidney disease: Secondary | ICD-10-CM | POA: Diagnosis not present

## 2018-10-30 DIAGNOSIS — N186 End stage renal disease: Secondary | ICD-10-CM | POA: Diagnosis not present

## 2018-10-30 DIAGNOSIS — Z992 Dependence on renal dialysis: Secondary | ICD-10-CM | POA: Diagnosis not present

## 2018-10-30 DIAGNOSIS — N2581 Secondary hyperparathyroidism of renal origin: Secondary | ICD-10-CM | POA: Diagnosis not present

## 2018-11-01 DIAGNOSIS — N186 End stage renal disease: Secondary | ICD-10-CM | POA: Diagnosis not present

## 2018-11-01 DIAGNOSIS — Z992 Dependence on renal dialysis: Secondary | ICD-10-CM | POA: Diagnosis not present

## 2018-11-01 DIAGNOSIS — N2581 Secondary hyperparathyroidism of renal origin: Secondary | ICD-10-CM | POA: Diagnosis not present

## 2018-11-01 DIAGNOSIS — D509 Iron deficiency anemia, unspecified: Secondary | ICD-10-CM | POA: Diagnosis not present

## 2018-11-01 DIAGNOSIS — D631 Anemia in chronic kidney disease: Secondary | ICD-10-CM | POA: Diagnosis not present

## 2018-11-04 ENCOUNTER — Telehealth (INDEPENDENT_AMBULATORY_CARE_PROVIDER_SITE_OTHER): Payer: Self-pay | Admitting: Vascular Surgery

## 2018-11-04 DIAGNOSIS — D631 Anemia in chronic kidney disease: Secondary | ICD-10-CM | POA: Diagnosis not present

## 2018-11-04 DIAGNOSIS — Z992 Dependence on renal dialysis: Secondary | ICD-10-CM | POA: Diagnosis not present

## 2018-11-04 DIAGNOSIS — N2581 Secondary hyperparathyroidism of renal origin: Secondary | ICD-10-CM | POA: Diagnosis not present

## 2018-11-04 DIAGNOSIS — N186 End stage renal disease: Secondary | ICD-10-CM | POA: Diagnosis not present

## 2018-11-04 DIAGNOSIS — D509 Iron deficiency anemia, unspecified: Secondary | ICD-10-CM | POA: Diagnosis not present

## 2018-11-04 NOTE — Telephone Encounter (Signed)
Please schedule appointment requested by Dr. Lucky Cowboy below

## 2018-11-04 NOTE — Telephone Encounter (Signed)
Please advise on message below.

## 2018-11-06 DIAGNOSIS — D509 Iron deficiency anemia, unspecified: Secondary | ICD-10-CM | POA: Diagnosis not present

## 2018-11-06 DIAGNOSIS — Z992 Dependence on renal dialysis: Secondary | ICD-10-CM | POA: Diagnosis not present

## 2018-11-06 DIAGNOSIS — D631 Anemia in chronic kidney disease: Secondary | ICD-10-CM | POA: Diagnosis not present

## 2018-11-06 DIAGNOSIS — N186 End stage renal disease: Secondary | ICD-10-CM | POA: Diagnosis not present

## 2018-11-06 DIAGNOSIS — N2581 Secondary hyperparathyroidism of renal origin: Secondary | ICD-10-CM | POA: Diagnosis not present

## 2018-11-07 ENCOUNTER — Other Ambulatory Visit: Payer: Self-pay

## 2018-11-07 ENCOUNTER — Ambulatory Visit (INDEPENDENT_AMBULATORY_CARE_PROVIDER_SITE_OTHER): Payer: Medicare Other | Admitting: Nurse Practitioner

## 2018-11-07 ENCOUNTER — Ambulatory Visit (INDEPENDENT_AMBULATORY_CARE_PROVIDER_SITE_OTHER): Payer: Medicare Other

## 2018-11-07 ENCOUNTER — Encounter (INDEPENDENT_AMBULATORY_CARE_PROVIDER_SITE_OTHER): Payer: Self-pay | Admitting: Nurse Practitioner

## 2018-11-07 VITALS — BP 133/65 | HR 70 | Resp 17 | Ht 64.0 in | Wt 131.0 lb

## 2018-11-07 DIAGNOSIS — Z992 Dependence on renal dialysis: Secondary | ICD-10-CM

## 2018-11-07 DIAGNOSIS — E782 Mixed hyperlipidemia: Secondary | ICD-10-CM | POA: Diagnosis not present

## 2018-11-07 DIAGNOSIS — I120 Hypertensive chronic kidney disease with stage 5 chronic kidney disease or end stage renal disease: Secondary | ICD-10-CM

## 2018-11-07 DIAGNOSIS — N186 End stage renal disease: Secondary | ICD-10-CM

## 2018-11-07 DIAGNOSIS — S40021A Contusion of right upper arm, initial encounter: Secondary | ICD-10-CM

## 2018-11-07 DIAGNOSIS — I10 Essential (primary) hypertension: Secondary | ICD-10-CM

## 2018-11-07 NOTE — Progress Notes (Signed)
Subjective:    Patient ID: [name], female    DOB: 1951/09/04, 67 y.o.   MRN: [id number] Chief Complaint  Patient presents with  . Follow-up    ultrasound    HPI  [name] is a 67 y.o. female presenting today with concerns after a fall.  She fell and hit her right upper extremity against the floor last Sunday.  She previously had a fistula in her right upper extremity, however it is nonfunctional.  She is was concerned due to the fact that there was simply a large swollen knot.  On presentation today there is still a knot near the previous aneurysmal structure of her previous fistula, however according to the patient it is much smaller than it was.  There is also evidence of bruising down her arm.  She states that it is tender to palpation otherwise not painful.  She denies any actual bleeding.  She denies any fever, chills, nausea, vomiting or diarrhea.  She denies any chest pain or shortness of breath.  She denies any dizziness or syncope.  Patient underwent a duplex of her lower extremity right thigh AV graft created on 08/27/2016.  The flow volume was 2444.  The AV graft had to dilate areas, which were previously seen on previous exams done on 11/01/2017.  There is also no evidence of significant stenosis.  Past Medical History:  Diagnosis Date  . Chronic systolic heart failure ([place])   . Coronary atherosclerosis of native coronary artery   . Diseases of tricuspid valve   . End stage renal disease ([place])   . Mitral valve insufficiency and aortic valve insufficiency   . Other and unspecified hyperlipidemia   . Secondary cardiomyopathy, unspecified   . Unspecified essential hypertension     Past Surgical History:  Procedure Laterality Date  . AV FISTULA PLACEMENT Right 2012  . AV FISTULA PLACEMENT Left 2004  . INSERTION OF DIALYSIS CATHETER N/A 01/08/2013   Procedure: INSERTION OF DIALYSIS CATHETER;  Surgeon: Angelia Mould, MD;  Location: [facility];  Service: Vascular;   Laterality: N/A;  . KIDNEY TRANSPLANT Left 2002   pt. states transplant lasted 2 yrs.  Marland Kitchen PERIPHERAL VASCULAR CATHETERIZATION Right 08/27/2016   Procedure: A/V Shuntogram/Fistulagram;  Surgeon: Algernon Huxley, MD;  Location: [facility];  Service: Cardiovascular;  Laterality: Right;    Social History   Socioeconomic History  . Marital status: Single    Spouse name: Not on file  . Number of children: Not on file  . Years of education: Not on file  . Highest education level: Not on file  Occupational History  . Not on file  Social Needs  . Financial resource strain: Not on file  . Food insecurity:    Worry: Not on file    Inability: Not on file  . Transportation needs:    Medical: Not on file    Non-medical: Not on file  Tobacco Use  . Smoking status: Never Smoker  . Smokeless tobacco: Never Used  Substance and Sexual Activity  . Alcohol use: No  . Drug use: No  . Sexual activity: Not on file  Lifestyle  . Physical activity:    Days per week: Not on file    Minutes per session: Not on file  . Stress: Not on file  Relationships  . Social connections:    Talks on phone: Not on file    Gets together: Not on file    Attends religious service:  Not on file    Active member of club or organization: Not on file    Attends meetings of clubs or organizations: Not on file    Relationship status: Not on file  . Intimate partner violence:    Fear of current or ex partner: Not on file    Emotionally abused: Not on file    Physically abused: Not on file    Forced sexual activity: Not on file  Other Topics Concern  . Not on file  Social History Narrative  . Not on file    History reviewed. No pertinent family history.  Allergies  Allergen Reactions  . Dilaudid [Hydromorphone] Nausea And Vomiting  . Tape Rash    Plastic tape     Review of Systems   Review of Systems: Negative Unless Checked Constitutional: [] Weight loss  [] Fever  [] Chills Cardiac: [] Chest pain    []  Atrial Fibrillation  [] Palpitations   [] Shortness of breath when laying flat   [] Shortness of breath with exertion. [] Shortness of breath at rest Vascular:  [] Pain in legs with walking   [] Pain in legs with standing [] Pain in legs when laying flat   [] Claudication    [] Pain in feet when laying flat    [] History of DVT   [] Phlebitis   [] Swelling in legs   [] Varicose veins   [] Non-healing ulcers Pulmonary:   [] Uses home oxygen   [] Productive cough   [] Hemoptysis   [] Wheeze  [] COPD   [] Asthma Neurologic:  [] Dizziness   [] Seizures  [] Blackouts [] History of stroke   [] History of TIA  [] Aphasia   [] Temporary Blindness   [] Weakness or numbness in arm   [] Weakness or numbness in leg Musculoskeletal:   [] Joint swelling   [] Joint pain   [] Low back pain  []  History of Knee Replacement [] Arthritis [] back Surgeries  []  Spinal Stenosis    Hematologic:  [] Easy bruising  [] Easy bleeding   [] Hypercoagulable state   [] Anemic Gastrointestinal:  [] Diarrhea   [] Vomiting  [] Gastroesophageal reflux/heartburn   [] Difficulty swallowing. [] Abdominal pain Genitourinary:  [] Chronic kidney disease   [] Difficult urination  [] Anuric   [] Blood in urine [] Frequent urination  [] Burning with urination   [] Hematuria Skin:  [] Rashes   [] Ulcers [] Wounds Psychological:  [] History of anxiety   []  History of major depression  []  Memory Difficulties     Objective:   Physical Exam  BP 133/65   Pulse 70   Resp 17   Ht 5\' 4"  (1.626 m)   Wt 131 lb (59.4 kg)   BMI 22.49 kg/m   Gen: WD/WN, NAD Head: [place]/AT, No temporalis wasting.  Ear/Nose/Throat: Hearing grossly intact, nares w/o erythema or drainage Eyes: PER, EOMI, sclera nonicteric.  Neck: Supple, no masses.  No JVD.  Pulmonary:  Good air movement, no use of accessory muscles.  Cardiac: RRR Vascular:  Vessel Right Left  Radial Palpable Palpable  Brachial Palpable Palpable  Femoral Palpable Palpable  Popliteal Palpable Palpable  Dorsalis Pedis Palpable Palpable  Posterior  Tibial Palpable Palpable   Gastrointestinal: soft, non-distended. No guarding/no peritoneal signs.  Musculoskeletal: M/S 5/5 throughout.  No deformity or atrophy.  Neurologic: Pain and light touch intact in extremities.  Symmetrical.  Speech is fluent. Motor exam as listed above. Psychiatric: Judgment intact, Mood & affect appropriate for pt's clinical situation. Dermatologic: No Venous rashes. No Ulcers Noted.  No changes consistent with cellulitis. Lymph : No Cervical lymphadenopathy, no lichenification or skin changes of chronic lymphedema.      Assessment & Plan:  1. ESRD on dialysis [facility]) Patient underwent a duplex of her lower extremity right thigh AV graft created on 08/27/2016.  The flow volume was 2444.  The AV graft had to dilate areas, which were previously seen on previous exams done on 11/01/2017.  There is also no evidence of significant stenosis.  Recommend:  The patient is doing well and currently has adequate dialysis access. The patient's dialysis center is not reporting any access issues. Flow pattern is stable when compared to the prior ultrasound.  The patient should have a duplex ultrasound of the dialysis access in 6 months. The patient will follow-up with me in the office after each ultrasound    - VAS [place] (AVF, AVG); Future  2. Mixed hyperlipidemia Continue statin as ordered and reviewed, no changes at this time   3. Essential hypertension Continue antihypertensive medications as already ordered, these medications have been reviewed and there are no changes at this time.   4. Hematoma of arm, right, initial encounter  The patient originally should have had an HDA of her right upper extremity not her right thigh graft, however there was some orders.  I spoke to the patient about doing an exam of the right upper arm however due to the time constraints she elected to forego it.  Patient has a small grape sized area of swelling near  her previous aneurysm.  This area is also covered with bruises.  Advised the patient that it should go down over the next few weeks.  However she should contact us if it suddenly becomes larger or begins to ooze blood.  I also told her expect some bruising to increase as it is reabsorbed.  I also advised her to use warm compresses over the site to help with the pain.   Current Outpatient Medications on File Prior to Visit  Medication Sig Dispense Refill  . acetaminophen (TYLENOL) 500 MG tablet Take 1,000 mg by mouth every 6 (six) hours as needed for pain (headache).    Marland Kitchen aspirin 81 MG tablet Take 81 mg by mouth daily.    Marland Kitchen atorvastatin (LIPITOR) 20 MG tablet Take by mouth.    . B Complex-C-Folic Acid (RENAL) 1 MG CAPS Take by mouth.    . B Complex-C-Zn-Folic Acid (NEPHPLEX RX PO) Take 1 tablet by mouth daily.    . cinacalcet (SENSIPAR) 30 MG tablet Take 30 mg by mouth daily after supper.    . docusate sodium (COLACE) 100 MG capsule Take by mouth.    . gabapentin (NEURONTIN) 300 MG capsule     . HYDROcodone-acetaminophen (NORCO/VICODIN) 5-325 MG tablet     . isosorbide mononitrate (IMDUR) 30 MG 24 hr tablet Take 30 mg by mouth daily.    Marland Kitchen labetalol (NORMODYNE) 300 MG tablet Take 300 mg by mouth 2 (two) times daily.    . minoxidil (LONITEN) 2.5 MG tablet Take 2.5 mg by mouth 2 (two) times daily.    Marland Kitchen omeprazole (PRILOSEC) 40 MG capsule     . sevelamer (RENAGEL) 800 MG tablet Take 800-1,600 mg by mouth See admin instructions. Take 2 tablets by mouth with meals and 1 with snacks.    . sevelamer carbonate (RENVELA) 800 MG tablet Take by mouth.    . simvastatin (ZOCOR) 20 MG tablet Take 20 mg by mouth daily.    . traMADol (ULTRAM) 50 MG tablet Take by mouth.     No current facility-administered medications on file prior to visit.     There  are no Patient Instructions on file for this visit. No follow-ups on file.   Kris Hartmann, NP  This note was completed with Sales executive.  Any  errors are purely unintentional.

## 2018-11-08 DIAGNOSIS — D509 Iron deficiency anemia, unspecified: Secondary | ICD-10-CM | POA: Diagnosis not present

## 2018-11-08 DIAGNOSIS — Z992 Dependence on renal dialysis: Secondary | ICD-10-CM | POA: Diagnosis not present

## 2018-11-08 DIAGNOSIS — D631 Anemia in chronic kidney disease: Secondary | ICD-10-CM | POA: Diagnosis not present

## 2018-11-08 DIAGNOSIS — N186 End stage renal disease: Secondary | ICD-10-CM | POA: Diagnosis not present

## 2018-11-08 DIAGNOSIS — N2581 Secondary hyperparathyroidism of renal origin: Secondary | ICD-10-CM | POA: Diagnosis not present

## 2018-11-10 DIAGNOSIS — Z992 Dependence on renal dialysis: Secondary | ICD-10-CM | POA: Diagnosis not present

## 2018-11-10 DIAGNOSIS — D631 Anemia in chronic kidney disease: Secondary | ICD-10-CM | POA: Diagnosis not present

## 2018-11-10 DIAGNOSIS — N2581 Secondary hyperparathyroidism of renal origin: Secondary | ICD-10-CM | POA: Diagnosis not present

## 2018-11-10 DIAGNOSIS — D509 Iron deficiency anemia, unspecified: Secondary | ICD-10-CM | POA: Diagnosis not present

## 2018-11-10 DIAGNOSIS — N186 End stage renal disease: Secondary | ICD-10-CM | POA: Diagnosis not present

## 2018-11-11 DIAGNOSIS — E7800 Pure hypercholesterolemia, unspecified: Secondary | ICD-10-CM | POA: Diagnosis not present

## 2018-11-11 DIAGNOSIS — I10 Essential (primary) hypertension: Secondary | ICD-10-CM | POA: Diagnosis not present

## 2018-11-13 DIAGNOSIS — D509 Iron deficiency anemia, unspecified: Secondary | ICD-10-CM | POA: Diagnosis not present

## 2018-11-13 DIAGNOSIS — Z992 Dependence on renal dialysis: Secondary | ICD-10-CM | POA: Diagnosis not present

## 2018-11-13 DIAGNOSIS — N2581 Secondary hyperparathyroidism of renal origin: Secondary | ICD-10-CM | POA: Diagnosis not present

## 2018-11-13 DIAGNOSIS — N186 End stage renal disease: Secondary | ICD-10-CM | POA: Diagnosis not present

## 2018-11-13 DIAGNOSIS — D631 Anemia in chronic kidney disease: Secondary | ICD-10-CM | POA: Diagnosis not present

## 2018-11-15 DIAGNOSIS — N2581 Secondary hyperparathyroidism of renal origin: Secondary | ICD-10-CM | POA: Diagnosis not present

## 2018-11-15 DIAGNOSIS — D631 Anemia in chronic kidney disease: Secondary | ICD-10-CM | POA: Diagnosis not present

## 2018-11-15 DIAGNOSIS — D509 Iron deficiency anemia, unspecified: Secondary | ICD-10-CM | POA: Diagnosis not present

## 2018-11-15 DIAGNOSIS — N186 End stage renal disease: Secondary | ICD-10-CM | POA: Diagnosis not present

## 2018-11-15 DIAGNOSIS — Z992 Dependence on renal dialysis: Secondary | ICD-10-CM | POA: Diagnosis not present

## 2018-11-18 DIAGNOSIS — N2581 Secondary hyperparathyroidism of renal origin: Secondary | ICD-10-CM | POA: Diagnosis not present

## 2018-11-18 DIAGNOSIS — D631 Anemia in chronic kidney disease: Secondary | ICD-10-CM | POA: Diagnosis not present

## 2018-11-18 DIAGNOSIS — N186 End stage renal disease: Secondary | ICD-10-CM | POA: Diagnosis not present

## 2018-11-18 DIAGNOSIS — D509 Iron deficiency anemia, unspecified: Secondary | ICD-10-CM | POA: Diagnosis not present

## 2018-11-18 DIAGNOSIS — Z992 Dependence on renal dialysis: Secondary | ICD-10-CM | POA: Diagnosis not present

## 2018-11-20 DIAGNOSIS — N186 End stage renal disease: Secondary | ICD-10-CM | POA: Diagnosis not present

## 2018-11-20 DIAGNOSIS — D631 Anemia in chronic kidney disease: Secondary | ICD-10-CM | POA: Diagnosis not present

## 2018-11-20 DIAGNOSIS — N2581 Secondary hyperparathyroidism of renal origin: Secondary | ICD-10-CM | POA: Diagnosis not present

## 2018-11-20 DIAGNOSIS — D509 Iron deficiency anemia, unspecified: Secondary | ICD-10-CM | POA: Diagnosis not present

## 2018-11-20 DIAGNOSIS — Z992 Dependence on renal dialysis: Secondary | ICD-10-CM | POA: Diagnosis not present

## 2018-11-22 DIAGNOSIS — D509 Iron deficiency anemia, unspecified: Secondary | ICD-10-CM | POA: Diagnosis not present

## 2018-11-22 DIAGNOSIS — D631 Anemia in chronic kidney disease: Secondary | ICD-10-CM | POA: Diagnosis not present

## 2018-11-22 DIAGNOSIS — N2581 Secondary hyperparathyroidism of renal origin: Secondary | ICD-10-CM | POA: Diagnosis not present

## 2018-11-22 DIAGNOSIS — N186 End stage renal disease: Secondary | ICD-10-CM | POA: Diagnosis not present

## 2018-11-22 DIAGNOSIS — Z992 Dependence on renal dialysis: Secondary | ICD-10-CM | POA: Diagnosis not present

## 2018-11-25 DIAGNOSIS — N2581 Secondary hyperparathyroidism of renal origin: Secondary | ICD-10-CM | POA: Diagnosis not present

## 2018-11-25 DIAGNOSIS — N186 End stage renal disease: Secondary | ICD-10-CM | POA: Diagnosis not present

## 2018-11-25 DIAGNOSIS — D509 Iron deficiency anemia, unspecified: Secondary | ICD-10-CM | POA: Diagnosis not present

## 2018-11-25 DIAGNOSIS — D631 Anemia in chronic kidney disease: Secondary | ICD-10-CM | POA: Diagnosis not present

## 2018-11-25 DIAGNOSIS — Z992 Dependence on renal dialysis: Secondary | ICD-10-CM | POA: Diagnosis not present

## 2018-11-27 DIAGNOSIS — D631 Anemia in chronic kidney disease: Secondary | ICD-10-CM | POA: Diagnosis not present

## 2018-11-27 DIAGNOSIS — Z992 Dependence on renal dialysis: Secondary | ICD-10-CM | POA: Diagnosis not present

## 2018-11-27 DIAGNOSIS — N2581 Secondary hyperparathyroidism of renal origin: Secondary | ICD-10-CM | POA: Diagnosis not present

## 2018-11-27 DIAGNOSIS — D509 Iron deficiency anemia, unspecified: Secondary | ICD-10-CM | POA: Diagnosis not present

## 2018-11-27 DIAGNOSIS — N186 End stage renal disease: Secondary | ICD-10-CM | POA: Diagnosis not present

## 2018-11-29 DIAGNOSIS — N2581 Secondary hyperparathyroidism of renal origin: Secondary | ICD-10-CM | POA: Diagnosis not present

## 2018-11-29 DIAGNOSIS — D509 Iron deficiency anemia, unspecified: Secondary | ICD-10-CM | POA: Diagnosis not present

## 2018-11-29 DIAGNOSIS — D631 Anemia in chronic kidney disease: Secondary | ICD-10-CM | POA: Diagnosis not present

## 2018-11-29 DIAGNOSIS — Z992 Dependence on renal dialysis: Secondary | ICD-10-CM | POA: Diagnosis not present

## 2018-11-29 DIAGNOSIS — N186 End stage renal disease: Secondary | ICD-10-CM | POA: Diagnosis not present

## 2018-12-02 DIAGNOSIS — D509 Iron deficiency anemia, unspecified: Secondary | ICD-10-CM | POA: Diagnosis not present

## 2018-12-02 DIAGNOSIS — N2581 Secondary hyperparathyroidism of renal origin: Secondary | ICD-10-CM | POA: Diagnosis not present

## 2018-12-02 DIAGNOSIS — D631 Anemia in chronic kidney disease: Secondary | ICD-10-CM | POA: Diagnosis not present

## 2018-12-02 DIAGNOSIS — Z992 Dependence on renal dialysis: Secondary | ICD-10-CM | POA: Diagnosis not present

## 2018-12-02 DIAGNOSIS — N186 End stage renal disease: Secondary | ICD-10-CM | POA: Diagnosis not present

## 2018-12-04 DIAGNOSIS — N2581 Secondary hyperparathyroidism of renal origin: Secondary | ICD-10-CM | POA: Diagnosis not present

## 2018-12-04 DIAGNOSIS — N186 End stage renal disease: Secondary | ICD-10-CM | POA: Diagnosis not present

## 2018-12-04 DIAGNOSIS — D631 Anemia in chronic kidney disease: Secondary | ICD-10-CM | POA: Diagnosis not present

## 2018-12-04 DIAGNOSIS — Z992 Dependence on renal dialysis: Secondary | ICD-10-CM | POA: Diagnosis not present

## 2018-12-04 DIAGNOSIS — D509 Iron deficiency anemia, unspecified: Secondary | ICD-10-CM | POA: Diagnosis not present

## 2018-12-06 DIAGNOSIS — Z992 Dependence on renal dialysis: Secondary | ICD-10-CM | POA: Diagnosis not present

## 2018-12-06 DIAGNOSIS — D631 Anemia in chronic kidney disease: Secondary | ICD-10-CM | POA: Diagnosis not present

## 2018-12-06 DIAGNOSIS — N186 End stage renal disease: Secondary | ICD-10-CM | POA: Diagnosis not present

## 2018-12-06 DIAGNOSIS — N2581 Secondary hyperparathyroidism of renal origin: Secondary | ICD-10-CM | POA: Diagnosis not present

## 2018-12-06 DIAGNOSIS — D509 Iron deficiency anemia, unspecified: Secondary | ICD-10-CM | POA: Diagnosis not present

## 2018-12-09 DIAGNOSIS — D631 Anemia in chronic kidney disease: Secondary | ICD-10-CM | POA: Diagnosis not present

## 2018-12-09 DIAGNOSIS — D509 Iron deficiency anemia, unspecified: Secondary | ICD-10-CM | POA: Diagnosis not present

## 2018-12-09 DIAGNOSIS — Z992 Dependence on renal dialysis: Secondary | ICD-10-CM | POA: Diagnosis not present

## 2018-12-09 DIAGNOSIS — N2581 Secondary hyperparathyroidism of renal origin: Secondary | ICD-10-CM | POA: Diagnosis not present

## 2018-12-09 DIAGNOSIS — N186 End stage renal disease: Secondary | ICD-10-CM | POA: Diagnosis not present

## 2018-12-10 DIAGNOSIS — E7800 Pure hypercholesterolemia, unspecified: Secondary | ICD-10-CM | POA: Diagnosis not present

## 2018-12-10 DIAGNOSIS — I10 Essential (primary) hypertension: Secondary | ICD-10-CM | POA: Diagnosis not present

## 2018-12-11 DIAGNOSIS — N186 End stage renal disease: Secondary | ICD-10-CM | POA: Diagnosis not present

## 2018-12-11 DIAGNOSIS — Z992 Dependence on renal dialysis: Secondary | ICD-10-CM | POA: Diagnosis not present

## 2018-12-11 DIAGNOSIS — D509 Iron deficiency anemia, unspecified: Secondary | ICD-10-CM | POA: Diagnosis not present

## 2018-12-11 DIAGNOSIS — D631 Anemia in chronic kidney disease: Secondary | ICD-10-CM | POA: Diagnosis not present

## 2018-12-11 DIAGNOSIS — N2581 Secondary hyperparathyroidism of renal origin: Secondary | ICD-10-CM | POA: Diagnosis not present

## 2018-12-12 DIAGNOSIS — N186 End stage renal disease: Secondary | ICD-10-CM | POA: Diagnosis not present

## 2018-12-12 DIAGNOSIS — D509 Iron deficiency anemia, unspecified: Secondary | ICD-10-CM | POA: Diagnosis not present

## 2018-12-12 DIAGNOSIS — N2581 Secondary hyperparathyroidism of renal origin: Secondary | ICD-10-CM | POA: Diagnosis not present

## 2018-12-12 DIAGNOSIS — Z992 Dependence on renal dialysis: Secondary | ICD-10-CM | POA: Diagnosis not present

## 2018-12-12 DIAGNOSIS — D631 Anemia in chronic kidney disease: Secondary | ICD-10-CM | POA: Diagnosis not present

## 2018-12-15 DIAGNOSIS — D631 Anemia in chronic kidney disease: Secondary | ICD-10-CM | POA: Diagnosis not present

## 2018-12-15 DIAGNOSIS — D509 Iron deficiency anemia, unspecified: Secondary | ICD-10-CM | POA: Diagnosis not present

## 2018-12-15 DIAGNOSIS — N186 End stage renal disease: Secondary | ICD-10-CM | POA: Diagnosis not present

## 2018-12-15 DIAGNOSIS — Z992 Dependence on renal dialysis: Secondary | ICD-10-CM | POA: Diagnosis not present

## 2018-12-15 DIAGNOSIS — N2581 Secondary hyperparathyroidism of renal origin: Secondary | ICD-10-CM | POA: Diagnosis not present

## 2018-12-18 DIAGNOSIS — D631 Anemia in chronic kidney disease: Secondary | ICD-10-CM | POA: Diagnosis not present

## 2018-12-18 DIAGNOSIS — N2581 Secondary hyperparathyroidism of renal origin: Secondary | ICD-10-CM | POA: Diagnosis not present

## 2018-12-18 DIAGNOSIS — Z992 Dependence on renal dialysis: Secondary | ICD-10-CM | POA: Diagnosis not present

## 2018-12-18 DIAGNOSIS — N186 End stage renal disease: Secondary | ICD-10-CM | POA: Diagnosis not present

## 2018-12-18 DIAGNOSIS — D509 Iron deficiency anemia, unspecified: Secondary | ICD-10-CM | POA: Diagnosis not present

## 2018-12-20 DIAGNOSIS — D631 Anemia in chronic kidney disease: Secondary | ICD-10-CM | POA: Diagnosis not present

## 2018-12-20 DIAGNOSIS — D509 Iron deficiency anemia, unspecified: Secondary | ICD-10-CM | POA: Diagnosis not present

## 2018-12-20 DIAGNOSIS — N186 End stage renal disease: Secondary | ICD-10-CM | POA: Diagnosis not present

## 2018-12-20 DIAGNOSIS — Z23 Encounter for immunization: Secondary | ICD-10-CM | POA: Diagnosis not present

## 2018-12-20 DIAGNOSIS — N2581 Secondary hyperparathyroidism of renal origin: Secondary | ICD-10-CM | POA: Diagnosis not present

## 2018-12-20 DIAGNOSIS — Z992 Dependence on renal dialysis: Secondary | ICD-10-CM | POA: Diagnosis not present

## 2018-12-23 DIAGNOSIS — D631 Anemia in chronic kidney disease: Secondary | ICD-10-CM | POA: Diagnosis not present

## 2018-12-23 DIAGNOSIS — Z992 Dependence on renal dialysis: Secondary | ICD-10-CM | POA: Diagnosis not present

## 2018-12-23 DIAGNOSIS — N186 End stage renal disease: Secondary | ICD-10-CM | POA: Diagnosis not present

## 2018-12-23 DIAGNOSIS — Z23 Encounter for immunization: Secondary | ICD-10-CM | POA: Diagnosis not present

## 2018-12-23 DIAGNOSIS — D509 Iron deficiency anemia, unspecified: Secondary | ICD-10-CM | POA: Diagnosis not present

## 2018-12-23 DIAGNOSIS — N2581 Secondary hyperparathyroidism of renal origin: Secondary | ICD-10-CM | POA: Diagnosis not present

## 2018-12-25 DIAGNOSIS — N2581 Secondary hyperparathyroidism of renal origin: Secondary | ICD-10-CM | POA: Diagnosis not present

## 2018-12-25 DIAGNOSIS — Z23 Encounter for immunization: Secondary | ICD-10-CM | POA: Diagnosis not present

## 2018-12-25 DIAGNOSIS — Z992 Dependence on renal dialysis: Secondary | ICD-10-CM | POA: Diagnosis not present

## 2018-12-25 DIAGNOSIS — D631 Anemia in chronic kidney disease: Secondary | ICD-10-CM | POA: Diagnosis not present

## 2018-12-25 DIAGNOSIS — N186 End stage renal disease: Secondary | ICD-10-CM | POA: Diagnosis not present

## 2018-12-25 DIAGNOSIS — D509 Iron deficiency anemia, unspecified: Secondary | ICD-10-CM | POA: Diagnosis not present

## 2018-12-27 DIAGNOSIS — N186 End stage renal disease: Secondary | ICD-10-CM | POA: Diagnosis not present

## 2018-12-27 DIAGNOSIS — N2581 Secondary hyperparathyroidism of renal origin: Secondary | ICD-10-CM | POA: Diagnosis not present

## 2018-12-27 DIAGNOSIS — D631 Anemia in chronic kidney disease: Secondary | ICD-10-CM | POA: Diagnosis not present

## 2018-12-27 DIAGNOSIS — Z23 Encounter for immunization: Secondary | ICD-10-CM | POA: Diagnosis not present

## 2018-12-27 DIAGNOSIS — D509 Iron deficiency anemia, unspecified: Secondary | ICD-10-CM | POA: Diagnosis not present

## 2018-12-27 DIAGNOSIS — Z992 Dependence on renal dialysis: Secondary | ICD-10-CM | POA: Diagnosis not present

## 2018-12-30 DIAGNOSIS — Z23 Encounter for immunization: Secondary | ICD-10-CM | POA: Diagnosis not present

## 2018-12-30 DIAGNOSIS — Z992 Dependence on renal dialysis: Secondary | ICD-10-CM | POA: Diagnosis not present

## 2018-12-30 DIAGNOSIS — N2581 Secondary hyperparathyroidism of renal origin: Secondary | ICD-10-CM | POA: Diagnosis not present

## 2018-12-30 DIAGNOSIS — N186 End stage renal disease: Secondary | ICD-10-CM | POA: Diagnosis not present

## 2018-12-30 DIAGNOSIS — D509 Iron deficiency anemia, unspecified: Secondary | ICD-10-CM | POA: Diagnosis not present

## 2018-12-30 DIAGNOSIS — D631 Anemia in chronic kidney disease: Secondary | ICD-10-CM | POA: Diagnosis not present

## 2019-01-01 DIAGNOSIS — N186 End stage renal disease: Secondary | ICD-10-CM | POA: Diagnosis not present

## 2019-01-01 DIAGNOSIS — N2581 Secondary hyperparathyroidism of renal origin: Secondary | ICD-10-CM | POA: Diagnosis not present

## 2019-01-01 DIAGNOSIS — D631 Anemia in chronic kidney disease: Secondary | ICD-10-CM | POA: Diagnosis not present

## 2019-01-01 DIAGNOSIS — Z23 Encounter for immunization: Secondary | ICD-10-CM | POA: Diagnosis not present

## 2019-01-01 DIAGNOSIS — Z992 Dependence on renal dialysis: Secondary | ICD-10-CM | POA: Diagnosis not present

## 2019-01-01 DIAGNOSIS — D509 Iron deficiency anemia, unspecified: Secondary | ICD-10-CM | POA: Diagnosis not present

## 2019-01-03 DIAGNOSIS — Z23 Encounter for immunization: Secondary | ICD-10-CM | POA: Diagnosis not present

## 2019-01-03 DIAGNOSIS — N2581 Secondary hyperparathyroidism of renal origin: Secondary | ICD-10-CM | POA: Diagnosis not present

## 2019-01-03 DIAGNOSIS — N186 End stage renal disease: Secondary | ICD-10-CM | POA: Diagnosis not present

## 2019-01-03 DIAGNOSIS — Z992 Dependence on renal dialysis: Secondary | ICD-10-CM | POA: Diagnosis not present

## 2019-01-03 DIAGNOSIS — D631 Anemia in chronic kidney disease: Secondary | ICD-10-CM | POA: Diagnosis not present

## 2019-01-03 DIAGNOSIS — D509 Iron deficiency anemia, unspecified: Secondary | ICD-10-CM | POA: Diagnosis not present

## 2019-01-06 DIAGNOSIS — Z23 Encounter for immunization: Secondary | ICD-10-CM | POA: Diagnosis not present

## 2019-01-06 DIAGNOSIS — D509 Iron deficiency anemia, unspecified: Secondary | ICD-10-CM | POA: Diagnosis not present

## 2019-01-06 DIAGNOSIS — N2581 Secondary hyperparathyroidism of renal origin: Secondary | ICD-10-CM | POA: Diagnosis not present

## 2019-01-06 DIAGNOSIS — N186 End stage renal disease: Secondary | ICD-10-CM | POA: Diagnosis not present

## 2019-01-06 DIAGNOSIS — D631 Anemia in chronic kidney disease: Secondary | ICD-10-CM | POA: Diagnosis not present

## 2019-01-06 DIAGNOSIS — Z992 Dependence on renal dialysis: Secondary | ICD-10-CM | POA: Diagnosis not present

## 2019-01-08 DIAGNOSIS — D509 Iron deficiency anemia, unspecified: Secondary | ICD-10-CM | POA: Diagnosis not present

## 2019-01-08 DIAGNOSIS — Z23 Encounter for immunization: Secondary | ICD-10-CM | POA: Diagnosis not present

## 2019-01-08 DIAGNOSIS — D631 Anemia in chronic kidney disease: Secondary | ICD-10-CM | POA: Diagnosis not present

## 2019-01-08 DIAGNOSIS — N186 End stage renal disease: Secondary | ICD-10-CM | POA: Diagnosis not present

## 2019-01-08 DIAGNOSIS — N2581 Secondary hyperparathyroidism of renal origin: Secondary | ICD-10-CM | POA: Diagnosis not present

## 2019-01-08 DIAGNOSIS — Z992 Dependence on renal dialysis: Secondary | ICD-10-CM | POA: Diagnosis not present

## 2019-01-10 DIAGNOSIS — D509 Iron deficiency anemia, unspecified: Secondary | ICD-10-CM | POA: Diagnosis not present

## 2019-01-10 DIAGNOSIS — D631 Anemia in chronic kidney disease: Secondary | ICD-10-CM | POA: Diagnosis not present

## 2019-01-10 DIAGNOSIS — Z992 Dependence on renal dialysis: Secondary | ICD-10-CM | POA: Diagnosis not present

## 2019-01-10 DIAGNOSIS — N186 End stage renal disease: Secondary | ICD-10-CM | POA: Diagnosis not present

## 2019-01-10 DIAGNOSIS — Z23 Encounter for immunization: Secondary | ICD-10-CM | POA: Diagnosis not present

## 2019-01-10 DIAGNOSIS — N2581 Secondary hyperparathyroidism of renal origin: Secondary | ICD-10-CM | POA: Diagnosis not present

## 2019-01-13 DIAGNOSIS — N2581 Secondary hyperparathyroidism of renal origin: Secondary | ICD-10-CM | POA: Diagnosis not present

## 2019-01-13 DIAGNOSIS — D631 Anemia in chronic kidney disease: Secondary | ICD-10-CM | POA: Diagnosis not present

## 2019-01-13 DIAGNOSIS — N186 End stage renal disease: Secondary | ICD-10-CM | POA: Diagnosis not present

## 2019-01-13 DIAGNOSIS — Z23 Encounter for immunization: Secondary | ICD-10-CM | POA: Diagnosis not present

## 2019-01-13 DIAGNOSIS — Z992 Dependence on renal dialysis: Secondary | ICD-10-CM | POA: Diagnosis not present

## 2019-01-13 DIAGNOSIS — D509 Iron deficiency anemia, unspecified: Secondary | ICD-10-CM | POA: Diagnosis not present

## 2019-01-14 DIAGNOSIS — E7800 Pure hypercholesterolemia, unspecified: Secondary | ICD-10-CM | POA: Diagnosis not present

## 2019-01-14 DIAGNOSIS — I10 Essential (primary) hypertension: Secondary | ICD-10-CM | POA: Diagnosis not present

## 2019-01-15 DIAGNOSIS — D631 Anemia in chronic kidney disease: Secondary | ICD-10-CM | POA: Diagnosis not present

## 2019-01-15 DIAGNOSIS — Z992 Dependence on renal dialysis: Secondary | ICD-10-CM | POA: Diagnosis not present

## 2019-01-15 DIAGNOSIS — N186 End stage renal disease: Secondary | ICD-10-CM | POA: Diagnosis not present

## 2019-01-15 DIAGNOSIS — Z23 Encounter for immunization: Secondary | ICD-10-CM | POA: Diagnosis not present

## 2019-01-15 DIAGNOSIS — D509 Iron deficiency anemia, unspecified: Secondary | ICD-10-CM | POA: Diagnosis not present

## 2019-01-15 DIAGNOSIS — N2581 Secondary hyperparathyroidism of renal origin: Secondary | ICD-10-CM | POA: Diagnosis not present

## 2019-01-17 DIAGNOSIS — N2581 Secondary hyperparathyroidism of renal origin: Secondary | ICD-10-CM | POA: Diagnosis not present

## 2019-01-17 DIAGNOSIS — N186 End stage renal disease: Secondary | ICD-10-CM | POA: Diagnosis not present

## 2019-01-17 DIAGNOSIS — Z23 Encounter for immunization: Secondary | ICD-10-CM | POA: Diagnosis not present

## 2019-01-17 DIAGNOSIS — Z992 Dependence on renal dialysis: Secondary | ICD-10-CM | POA: Diagnosis not present

## 2019-01-17 DIAGNOSIS — D631 Anemia in chronic kidney disease: Secondary | ICD-10-CM | POA: Diagnosis not present

## 2019-01-17 DIAGNOSIS — D509 Iron deficiency anemia, unspecified: Secondary | ICD-10-CM | POA: Diagnosis not present

## 2019-01-20 DIAGNOSIS — N186 End stage renal disease: Secondary | ICD-10-CM | POA: Diagnosis not present

## 2019-01-20 DIAGNOSIS — N2581 Secondary hyperparathyroidism of renal origin: Secondary | ICD-10-CM | POA: Diagnosis not present

## 2019-01-20 DIAGNOSIS — Z992 Dependence on renal dialysis: Secondary | ICD-10-CM | POA: Diagnosis not present

## 2019-01-20 DIAGNOSIS — D631 Anemia in chronic kidney disease: Secondary | ICD-10-CM | POA: Diagnosis not present

## 2019-01-20 DIAGNOSIS — D509 Iron deficiency anemia, unspecified: Secondary | ICD-10-CM | POA: Diagnosis not present

## 2019-01-22 DIAGNOSIS — Z992 Dependence on renal dialysis: Secondary | ICD-10-CM | POA: Diagnosis not present

## 2019-01-22 DIAGNOSIS — D509 Iron deficiency anemia, unspecified: Secondary | ICD-10-CM | POA: Diagnosis not present

## 2019-01-22 DIAGNOSIS — N186 End stage renal disease: Secondary | ICD-10-CM | POA: Diagnosis not present

## 2019-01-22 DIAGNOSIS — N2581 Secondary hyperparathyroidism of renal origin: Secondary | ICD-10-CM | POA: Diagnosis not present

## 2019-01-22 DIAGNOSIS — D631 Anemia in chronic kidney disease: Secondary | ICD-10-CM | POA: Diagnosis not present

## 2019-01-24 DIAGNOSIS — Z992 Dependence on renal dialysis: Secondary | ICD-10-CM | POA: Diagnosis not present

## 2019-01-24 DIAGNOSIS — D509 Iron deficiency anemia, unspecified: Secondary | ICD-10-CM | POA: Diagnosis not present

## 2019-01-24 DIAGNOSIS — N186 End stage renal disease: Secondary | ICD-10-CM | POA: Diagnosis not present

## 2019-01-24 DIAGNOSIS — N2581 Secondary hyperparathyroidism of renal origin: Secondary | ICD-10-CM | POA: Diagnosis not present

## 2019-01-24 DIAGNOSIS — D631 Anemia in chronic kidney disease: Secondary | ICD-10-CM | POA: Diagnosis not present

## 2019-01-27 DIAGNOSIS — Z992 Dependence on renal dialysis: Secondary | ICD-10-CM | POA: Diagnosis not present

## 2019-01-27 DIAGNOSIS — N186 End stage renal disease: Secondary | ICD-10-CM | POA: Diagnosis not present

## 2019-01-27 DIAGNOSIS — N2581 Secondary hyperparathyroidism of renal origin: Secondary | ICD-10-CM | POA: Diagnosis not present

## 2019-01-27 DIAGNOSIS — D509 Iron deficiency anemia, unspecified: Secondary | ICD-10-CM | POA: Diagnosis not present

## 2019-01-27 DIAGNOSIS — D631 Anemia in chronic kidney disease: Secondary | ICD-10-CM | POA: Diagnosis not present

## 2019-01-29 DIAGNOSIS — Z992 Dependence on renal dialysis: Secondary | ICD-10-CM | POA: Diagnosis not present

## 2019-01-29 DIAGNOSIS — D509 Iron deficiency anemia, unspecified: Secondary | ICD-10-CM | POA: Diagnosis not present

## 2019-01-29 DIAGNOSIS — N2581 Secondary hyperparathyroidism of renal origin: Secondary | ICD-10-CM | POA: Diagnosis not present

## 2019-01-29 DIAGNOSIS — N186 End stage renal disease: Secondary | ICD-10-CM | POA: Diagnosis not present

## 2019-01-29 DIAGNOSIS — D631 Anemia in chronic kidney disease: Secondary | ICD-10-CM | POA: Diagnosis not present

## 2019-01-31 DIAGNOSIS — Z992 Dependence on renal dialysis: Secondary | ICD-10-CM | POA: Diagnosis not present

## 2019-01-31 DIAGNOSIS — D509 Iron deficiency anemia, unspecified: Secondary | ICD-10-CM | POA: Diagnosis not present

## 2019-01-31 DIAGNOSIS — D631 Anemia in chronic kidney disease: Secondary | ICD-10-CM | POA: Diagnosis not present

## 2019-01-31 DIAGNOSIS — N2581 Secondary hyperparathyroidism of renal origin: Secondary | ICD-10-CM | POA: Diagnosis not present

## 2019-01-31 DIAGNOSIS — N186 End stage renal disease: Secondary | ICD-10-CM | POA: Diagnosis not present

## 2019-02-03 DIAGNOSIS — D631 Anemia in chronic kidney disease: Secondary | ICD-10-CM | POA: Diagnosis not present

## 2019-02-03 DIAGNOSIS — N186 End stage renal disease: Secondary | ICD-10-CM | POA: Diagnosis not present

## 2019-02-03 DIAGNOSIS — N2581 Secondary hyperparathyroidism of renal origin: Secondary | ICD-10-CM | POA: Diagnosis not present

## 2019-02-03 DIAGNOSIS — D509 Iron deficiency anemia, unspecified: Secondary | ICD-10-CM | POA: Diagnosis not present

## 2019-02-03 DIAGNOSIS — Z992 Dependence on renal dialysis: Secondary | ICD-10-CM | POA: Diagnosis not present

## 2019-02-05 DIAGNOSIS — I429 Cardiomyopathy, unspecified: Secondary | ICD-10-CM | POA: Diagnosis not present

## 2019-02-05 DIAGNOSIS — Z992 Dependence on renal dialysis: Secondary | ICD-10-CM | POA: Diagnosis not present

## 2019-02-05 DIAGNOSIS — Z789 Other specified health status: Secondary | ICD-10-CM | POA: Diagnosis not present

## 2019-02-05 DIAGNOSIS — I270 Primary pulmonary hypertension: Secondary | ICD-10-CM | POA: Diagnosis not present

## 2019-02-05 DIAGNOSIS — Z6823 Body mass index (BMI) 23.0-23.9, adult: Secondary | ICD-10-CM | POA: Diagnosis not present

## 2019-02-05 DIAGNOSIS — N186 End stage renal disease: Secondary | ICD-10-CM | POA: Diagnosis not present

## 2019-02-05 DIAGNOSIS — D509 Iron deficiency anemia, unspecified: Secondary | ICD-10-CM | POA: Diagnosis not present

## 2019-02-05 DIAGNOSIS — N2581 Secondary hyperparathyroidism of renal origin: Secondary | ICD-10-CM | POA: Diagnosis not present

## 2019-02-05 DIAGNOSIS — Z299 Encounter for prophylactic measures, unspecified: Secondary | ICD-10-CM | POA: Diagnosis not present

## 2019-02-05 DIAGNOSIS — L0291 Cutaneous abscess, unspecified: Secondary | ICD-10-CM | POA: Diagnosis not present

## 2019-02-05 DIAGNOSIS — D631 Anemia in chronic kidney disease: Secondary | ICD-10-CM | POA: Diagnosis not present

## 2019-02-07 DIAGNOSIS — Z992 Dependence on renal dialysis: Secondary | ICD-10-CM | POA: Diagnosis not present

## 2019-02-07 DIAGNOSIS — D631 Anemia in chronic kidney disease: Secondary | ICD-10-CM | POA: Diagnosis not present

## 2019-02-07 DIAGNOSIS — D509 Iron deficiency anemia, unspecified: Secondary | ICD-10-CM | POA: Diagnosis not present

## 2019-02-07 DIAGNOSIS — N186 End stage renal disease: Secondary | ICD-10-CM | POA: Diagnosis not present

## 2019-02-07 DIAGNOSIS — N2581 Secondary hyperparathyroidism of renal origin: Secondary | ICD-10-CM | POA: Diagnosis not present

## 2019-02-10 DIAGNOSIS — D509 Iron deficiency anemia, unspecified: Secondary | ICD-10-CM | POA: Diagnosis not present

## 2019-02-10 DIAGNOSIS — D631 Anemia in chronic kidney disease: Secondary | ICD-10-CM | POA: Diagnosis not present

## 2019-02-10 DIAGNOSIS — E785 Hyperlipidemia, unspecified: Secondary | ICD-10-CM | POA: Diagnosis not present

## 2019-02-10 DIAGNOSIS — N186 End stage renal disease: Secondary | ICD-10-CM | POA: Diagnosis not present

## 2019-02-10 DIAGNOSIS — N2581 Secondary hyperparathyroidism of renal origin: Secondary | ICD-10-CM | POA: Diagnosis not present

## 2019-02-10 DIAGNOSIS — Z992 Dependence on renal dialysis: Secondary | ICD-10-CM | POA: Diagnosis not present

## 2019-02-12 DIAGNOSIS — D631 Anemia in chronic kidney disease: Secondary | ICD-10-CM | POA: Diagnosis not present

## 2019-02-12 DIAGNOSIS — N186 End stage renal disease: Secondary | ICD-10-CM | POA: Diagnosis not present

## 2019-02-12 DIAGNOSIS — Z992 Dependence on renal dialysis: Secondary | ICD-10-CM | POA: Diagnosis not present

## 2019-02-12 DIAGNOSIS — D509 Iron deficiency anemia, unspecified: Secondary | ICD-10-CM | POA: Diagnosis not present

## 2019-02-12 DIAGNOSIS — N2581 Secondary hyperparathyroidism of renal origin: Secondary | ICD-10-CM | POA: Diagnosis not present

## 2019-02-14 DIAGNOSIS — N186 End stage renal disease: Secondary | ICD-10-CM | POA: Diagnosis not present

## 2019-02-14 DIAGNOSIS — D631 Anemia in chronic kidney disease: Secondary | ICD-10-CM | POA: Diagnosis not present

## 2019-02-14 DIAGNOSIS — N2581 Secondary hyperparathyroidism of renal origin: Secondary | ICD-10-CM | POA: Diagnosis not present

## 2019-02-14 DIAGNOSIS — Z992 Dependence on renal dialysis: Secondary | ICD-10-CM | POA: Diagnosis not present

## 2019-02-14 DIAGNOSIS — D509 Iron deficiency anemia, unspecified: Secondary | ICD-10-CM | POA: Diagnosis not present

## 2019-02-17 DIAGNOSIS — Z992 Dependence on renal dialysis: Secondary | ICD-10-CM | POA: Diagnosis not present

## 2019-02-17 DIAGNOSIS — N2581 Secondary hyperparathyroidism of renal origin: Secondary | ICD-10-CM | POA: Diagnosis not present

## 2019-02-17 DIAGNOSIS — D509 Iron deficiency anemia, unspecified: Secondary | ICD-10-CM | POA: Diagnosis not present

## 2019-02-17 DIAGNOSIS — D631 Anemia in chronic kidney disease: Secondary | ICD-10-CM | POA: Diagnosis not present

## 2019-02-17 DIAGNOSIS — N186 End stage renal disease: Secondary | ICD-10-CM | POA: Diagnosis not present

## 2019-02-19 DIAGNOSIS — D509 Iron deficiency anemia, unspecified: Secondary | ICD-10-CM | POA: Diagnosis not present

## 2019-02-19 DIAGNOSIS — D631 Anemia in chronic kidney disease: Secondary | ICD-10-CM | POA: Diagnosis not present

## 2019-02-19 DIAGNOSIS — N186 End stage renal disease: Secondary | ICD-10-CM | POA: Diagnosis not present

## 2019-02-19 DIAGNOSIS — Z992 Dependence on renal dialysis: Secondary | ICD-10-CM | POA: Diagnosis not present

## 2019-02-19 DIAGNOSIS — N2581 Secondary hyperparathyroidism of renal origin: Secondary | ICD-10-CM | POA: Diagnosis not present

## 2019-02-21 DIAGNOSIS — N186 End stage renal disease: Secondary | ICD-10-CM | POA: Diagnosis not present

## 2019-02-21 DIAGNOSIS — D509 Iron deficiency anemia, unspecified: Secondary | ICD-10-CM | POA: Diagnosis not present

## 2019-02-21 DIAGNOSIS — N2581 Secondary hyperparathyroidism of renal origin: Secondary | ICD-10-CM | POA: Diagnosis not present

## 2019-02-21 DIAGNOSIS — D631 Anemia in chronic kidney disease: Secondary | ICD-10-CM | POA: Diagnosis not present

## 2019-02-21 DIAGNOSIS — Z992 Dependence on renal dialysis: Secondary | ICD-10-CM | POA: Diagnosis not present

## 2019-02-24 DIAGNOSIS — D509 Iron deficiency anemia, unspecified: Secondary | ICD-10-CM | POA: Diagnosis not present

## 2019-02-24 DIAGNOSIS — N186 End stage renal disease: Secondary | ICD-10-CM | POA: Diagnosis not present

## 2019-02-24 DIAGNOSIS — Z992 Dependence on renal dialysis: Secondary | ICD-10-CM | POA: Diagnosis not present

## 2019-02-24 DIAGNOSIS — N2581 Secondary hyperparathyroidism of renal origin: Secondary | ICD-10-CM | POA: Diagnosis not present

## 2019-02-24 DIAGNOSIS — D631 Anemia in chronic kidney disease: Secondary | ICD-10-CM | POA: Diagnosis not present

## 2019-02-26 DIAGNOSIS — Z992 Dependence on renal dialysis: Secondary | ICD-10-CM | POA: Diagnosis not present

## 2019-02-26 DIAGNOSIS — D509 Iron deficiency anemia, unspecified: Secondary | ICD-10-CM | POA: Diagnosis not present

## 2019-02-26 DIAGNOSIS — N186 End stage renal disease: Secondary | ICD-10-CM | POA: Diagnosis not present

## 2019-02-26 DIAGNOSIS — N2581 Secondary hyperparathyroidism of renal origin: Secondary | ICD-10-CM | POA: Diagnosis not present

## 2019-02-26 DIAGNOSIS — D631 Anemia in chronic kidney disease: Secondary | ICD-10-CM | POA: Diagnosis not present

## 2019-02-28 DIAGNOSIS — D509 Iron deficiency anemia, unspecified: Secondary | ICD-10-CM | POA: Diagnosis not present

## 2019-02-28 DIAGNOSIS — D631 Anemia in chronic kidney disease: Secondary | ICD-10-CM | POA: Diagnosis not present

## 2019-02-28 DIAGNOSIS — Z992 Dependence on renal dialysis: Secondary | ICD-10-CM | POA: Diagnosis not present

## 2019-02-28 DIAGNOSIS — N186 End stage renal disease: Secondary | ICD-10-CM | POA: Diagnosis not present

## 2019-02-28 DIAGNOSIS — N2581 Secondary hyperparathyroidism of renal origin: Secondary | ICD-10-CM | POA: Diagnosis not present

## 2019-03-03 DIAGNOSIS — N186 End stage renal disease: Secondary | ICD-10-CM | POA: Diagnosis not present

## 2019-03-03 DIAGNOSIS — N2581 Secondary hyperparathyroidism of renal origin: Secondary | ICD-10-CM | POA: Diagnosis not present

## 2019-03-03 DIAGNOSIS — Z992 Dependence on renal dialysis: Secondary | ICD-10-CM | POA: Diagnosis not present

## 2019-03-03 DIAGNOSIS — D631 Anemia in chronic kidney disease: Secondary | ICD-10-CM | POA: Diagnosis not present

## 2019-03-03 DIAGNOSIS — I10 Essential (primary) hypertension: Secondary | ICD-10-CM | POA: Diagnosis not present

## 2019-03-03 DIAGNOSIS — E7800 Pure hypercholesterolemia, unspecified: Secondary | ICD-10-CM | POA: Diagnosis not present

## 2019-03-03 DIAGNOSIS — D509 Iron deficiency anemia, unspecified: Secondary | ICD-10-CM | POA: Diagnosis not present

## 2019-03-05 DIAGNOSIS — N2581 Secondary hyperparathyroidism of renal origin: Secondary | ICD-10-CM | POA: Diagnosis not present

## 2019-03-05 DIAGNOSIS — D509 Iron deficiency anemia, unspecified: Secondary | ICD-10-CM | POA: Diagnosis not present

## 2019-03-05 DIAGNOSIS — D631 Anemia in chronic kidney disease: Secondary | ICD-10-CM | POA: Diagnosis not present

## 2019-03-05 DIAGNOSIS — Z992 Dependence on renal dialysis: Secondary | ICD-10-CM | POA: Diagnosis not present

## 2019-03-05 DIAGNOSIS — N186 End stage renal disease: Secondary | ICD-10-CM | POA: Diagnosis not present

## 2019-03-07 DIAGNOSIS — N186 End stage renal disease: Secondary | ICD-10-CM | POA: Diagnosis not present

## 2019-03-07 DIAGNOSIS — N2581 Secondary hyperparathyroidism of renal origin: Secondary | ICD-10-CM | POA: Diagnosis not present

## 2019-03-07 DIAGNOSIS — D509 Iron deficiency anemia, unspecified: Secondary | ICD-10-CM | POA: Diagnosis not present

## 2019-03-07 DIAGNOSIS — Z992 Dependence on renal dialysis: Secondary | ICD-10-CM | POA: Diagnosis not present

## 2019-03-07 DIAGNOSIS — D631 Anemia in chronic kidney disease: Secondary | ICD-10-CM | POA: Diagnosis not present

## 2019-03-10 DIAGNOSIS — D631 Anemia in chronic kidney disease: Secondary | ICD-10-CM | POA: Diagnosis not present

## 2019-03-10 DIAGNOSIS — Z992 Dependence on renal dialysis: Secondary | ICD-10-CM | POA: Diagnosis not present

## 2019-03-10 DIAGNOSIS — N2581 Secondary hyperparathyroidism of renal origin: Secondary | ICD-10-CM | POA: Diagnosis not present

## 2019-03-10 DIAGNOSIS — D509 Iron deficiency anemia, unspecified: Secondary | ICD-10-CM | POA: Diagnosis not present

## 2019-03-10 DIAGNOSIS — N186 End stage renal disease: Secondary | ICD-10-CM | POA: Diagnosis not present

## 2019-03-12 DIAGNOSIS — Z992 Dependence on renal dialysis: Secondary | ICD-10-CM | POA: Diagnosis not present

## 2019-03-12 DIAGNOSIS — N2581 Secondary hyperparathyroidism of renal origin: Secondary | ICD-10-CM | POA: Diagnosis not present

## 2019-03-12 DIAGNOSIS — D631 Anemia in chronic kidney disease: Secondary | ICD-10-CM | POA: Diagnosis not present

## 2019-03-12 DIAGNOSIS — N186 End stage renal disease: Secondary | ICD-10-CM | POA: Diagnosis not present

## 2019-03-12 DIAGNOSIS — D509 Iron deficiency anemia, unspecified: Secondary | ICD-10-CM | POA: Diagnosis not present

## 2019-03-14 DIAGNOSIS — N2581 Secondary hyperparathyroidism of renal origin: Secondary | ICD-10-CM | POA: Diagnosis not present

## 2019-03-14 DIAGNOSIS — D631 Anemia in chronic kidney disease: Secondary | ICD-10-CM | POA: Diagnosis not present

## 2019-03-14 DIAGNOSIS — Z992 Dependence on renal dialysis: Secondary | ICD-10-CM | POA: Diagnosis not present

## 2019-03-14 DIAGNOSIS — N186 End stage renal disease: Secondary | ICD-10-CM | POA: Diagnosis not present

## 2019-03-14 DIAGNOSIS — D509 Iron deficiency anemia, unspecified: Secondary | ICD-10-CM | POA: Diagnosis not present

## 2019-03-17 DIAGNOSIS — D631 Anemia in chronic kidney disease: Secondary | ICD-10-CM | POA: Diagnosis not present

## 2019-03-17 DIAGNOSIS — N186 End stage renal disease: Secondary | ICD-10-CM | POA: Diagnosis not present

## 2019-03-17 DIAGNOSIS — D509 Iron deficiency anemia, unspecified: Secondary | ICD-10-CM | POA: Diagnosis not present

## 2019-03-17 DIAGNOSIS — Z992 Dependence on renal dialysis: Secondary | ICD-10-CM | POA: Diagnosis not present

## 2019-03-17 DIAGNOSIS — N2581 Secondary hyperparathyroidism of renal origin: Secondary | ICD-10-CM | POA: Diagnosis not present

## 2019-03-19 DIAGNOSIS — D631 Anemia in chronic kidney disease: Secondary | ICD-10-CM | POA: Diagnosis not present

## 2019-03-19 DIAGNOSIS — D509 Iron deficiency anemia, unspecified: Secondary | ICD-10-CM | POA: Diagnosis not present

## 2019-03-19 DIAGNOSIS — N186 End stage renal disease: Secondary | ICD-10-CM | POA: Diagnosis not present

## 2019-03-19 DIAGNOSIS — N2581 Secondary hyperparathyroidism of renal origin: Secondary | ICD-10-CM | POA: Diagnosis not present

## 2019-03-19 DIAGNOSIS — Z992 Dependence on renal dialysis: Secondary | ICD-10-CM | POA: Diagnosis not present

## 2019-03-21 DIAGNOSIS — N2581 Secondary hyperparathyroidism of renal origin: Secondary | ICD-10-CM | POA: Diagnosis not present

## 2019-03-21 DIAGNOSIS — Z992 Dependence on renal dialysis: Secondary | ICD-10-CM | POA: Diagnosis not present

## 2019-03-21 DIAGNOSIS — N186 End stage renal disease: Secondary | ICD-10-CM | POA: Diagnosis not present

## 2019-03-21 DIAGNOSIS — D631 Anemia in chronic kidney disease: Secondary | ICD-10-CM | POA: Diagnosis not present

## 2019-03-21 DIAGNOSIS — D509 Iron deficiency anemia, unspecified: Secondary | ICD-10-CM | POA: Diagnosis not present

## 2019-03-24 DIAGNOSIS — D509 Iron deficiency anemia, unspecified: Secondary | ICD-10-CM | POA: Diagnosis not present

## 2019-03-24 DIAGNOSIS — N186 End stage renal disease: Secondary | ICD-10-CM | POA: Diagnosis not present

## 2019-03-24 DIAGNOSIS — D631 Anemia in chronic kidney disease: Secondary | ICD-10-CM | POA: Diagnosis not present

## 2019-03-24 DIAGNOSIS — Z992 Dependence on renal dialysis: Secondary | ICD-10-CM | POA: Diagnosis not present

## 2019-03-24 DIAGNOSIS — N2581 Secondary hyperparathyroidism of renal origin: Secondary | ICD-10-CM | POA: Diagnosis not present

## 2019-03-26 DIAGNOSIS — N2581 Secondary hyperparathyroidism of renal origin: Secondary | ICD-10-CM | POA: Diagnosis not present

## 2019-03-26 DIAGNOSIS — D509 Iron deficiency anemia, unspecified: Secondary | ICD-10-CM | POA: Diagnosis not present

## 2019-03-26 DIAGNOSIS — N186 End stage renal disease: Secondary | ICD-10-CM | POA: Diagnosis not present

## 2019-03-26 DIAGNOSIS — D631 Anemia in chronic kidney disease: Secondary | ICD-10-CM | POA: Diagnosis not present

## 2019-03-26 DIAGNOSIS — Z992 Dependence on renal dialysis: Secondary | ICD-10-CM | POA: Diagnosis not present

## 2019-03-28 DIAGNOSIS — Z992 Dependence on renal dialysis: Secondary | ICD-10-CM | POA: Diagnosis not present

## 2019-03-28 DIAGNOSIS — D509 Iron deficiency anemia, unspecified: Secondary | ICD-10-CM | POA: Diagnosis not present

## 2019-03-28 DIAGNOSIS — D631 Anemia in chronic kidney disease: Secondary | ICD-10-CM | POA: Diagnosis not present

## 2019-03-28 DIAGNOSIS — N2581 Secondary hyperparathyroidism of renal origin: Secondary | ICD-10-CM | POA: Diagnosis not present

## 2019-03-28 DIAGNOSIS — N186 End stage renal disease: Secondary | ICD-10-CM | POA: Diagnosis not present

## 2019-03-31 DIAGNOSIS — D509 Iron deficiency anemia, unspecified: Secondary | ICD-10-CM | POA: Diagnosis not present

## 2019-03-31 DIAGNOSIS — E7800 Pure hypercholesterolemia, unspecified: Secondary | ICD-10-CM | POA: Diagnosis not present

## 2019-03-31 DIAGNOSIS — N2581 Secondary hyperparathyroidism of renal origin: Secondary | ICD-10-CM | POA: Diagnosis not present

## 2019-03-31 DIAGNOSIS — Z992 Dependence on renal dialysis: Secondary | ICD-10-CM | POA: Diagnosis not present

## 2019-03-31 DIAGNOSIS — I10 Essential (primary) hypertension: Secondary | ICD-10-CM | POA: Diagnosis not present

## 2019-03-31 DIAGNOSIS — N186 End stage renal disease: Secondary | ICD-10-CM | POA: Diagnosis not present

## 2019-03-31 DIAGNOSIS — D631 Anemia in chronic kidney disease: Secondary | ICD-10-CM | POA: Diagnosis not present

## 2019-04-02 DIAGNOSIS — N2581 Secondary hyperparathyroidism of renal origin: Secondary | ICD-10-CM | POA: Diagnosis not present

## 2019-04-02 DIAGNOSIS — D509 Iron deficiency anemia, unspecified: Secondary | ICD-10-CM | POA: Diagnosis not present

## 2019-04-02 DIAGNOSIS — D631 Anemia in chronic kidney disease: Secondary | ICD-10-CM | POA: Diagnosis not present

## 2019-04-02 DIAGNOSIS — Z992 Dependence on renal dialysis: Secondary | ICD-10-CM | POA: Diagnosis not present

## 2019-04-02 DIAGNOSIS — N186 End stage renal disease: Secondary | ICD-10-CM | POA: Diagnosis not present

## 2019-04-04 DIAGNOSIS — Z992 Dependence on renal dialysis: Secondary | ICD-10-CM | POA: Diagnosis not present

## 2019-04-04 DIAGNOSIS — N186 End stage renal disease: Secondary | ICD-10-CM | POA: Diagnosis not present

## 2019-04-04 DIAGNOSIS — D631 Anemia in chronic kidney disease: Secondary | ICD-10-CM | POA: Diagnosis not present

## 2019-04-04 DIAGNOSIS — D509 Iron deficiency anemia, unspecified: Secondary | ICD-10-CM | POA: Diagnosis not present

## 2019-04-04 DIAGNOSIS — N2581 Secondary hyperparathyroidism of renal origin: Secondary | ICD-10-CM | POA: Diagnosis not present

## 2019-04-07 DIAGNOSIS — D509 Iron deficiency anemia, unspecified: Secondary | ICD-10-CM | POA: Diagnosis not present

## 2019-04-07 DIAGNOSIS — D631 Anemia in chronic kidney disease: Secondary | ICD-10-CM | POA: Diagnosis not present

## 2019-04-07 DIAGNOSIS — N2581 Secondary hyperparathyroidism of renal origin: Secondary | ICD-10-CM | POA: Diagnosis not present

## 2019-04-07 DIAGNOSIS — Z992 Dependence on renal dialysis: Secondary | ICD-10-CM | POA: Diagnosis not present

## 2019-04-07 DIAGNOSIS — N186 End stage renal disease: Secondary | ICD-10-CM | POA: Diagnosis not present

## 2019-04-09 DIAGNOSIS — D631 Anemia in chronic kidney disease: Secondary | ICD-10-CM | POA: Diagnosis not present

## 2019-04-09 DIAGNOSIS — Z992 Dependence on renal dialysis: Secondary | ICD-10-CM | POA: Diagnosis not present

## 2019-04-09 DIAGNOSIS — D509 Iron deficiency anemia, unspecified: Secondary | ICD-10-CM | POA: Diagnosis not present

## 2019-04-09 DIAGNOSIS — N2581 Secondary hyperparathyroidism of renal origin: Secondary | ICD-10-CM | POA: Diagnosis not present

## 2019-04-09 DIAGNOSIS — N186 End stage renal disease: Secondary | ICD-10-CM | POA: Diagnosis not present

## 2019-04-11 DIAGNOSIS — N2581 Secondary hyperparathyroidism of renal origin: Secondary | ICD-10-CM | POA: Diagnosis not present

## 2019-04-11 DIAGNOSIS — D631 Anemia in chronic kidney disease: Secondary | ICD-10-CM | POA: Diagnosis not present

## 2019-04-11 DIAGNOSIS — N186 End stage renal disease: Secondary | ICD-10-CM | POA: Diagnosis not present

## 2019-04-11 DIAGNOSIS — Z992 Dependence on renal dialysis: Secondary | ICD-10-CM | POA: Diagnosis not present

## 2019-04-11 DIAGNOSIS — D509 Iron deficiency anemia, unspecified: Secondary | ICD-10-CM | POA: Diagnosis not present

## 2019-04-14 DIAGNOSIS — N2581 Secondary hyperparathyroidism of renal origin: Secondary | ICD-10-CM | POA: Diagnosis not present

## 2019-04-14 DIAGNOSIS — N186 End stage renal disease: Secondary | ICD-10-CM | POA: Diagnosis not present

## 2019-04-14 DIAGNOSIS — D509 Iron deficiency anemia, unspecified: Secondary | ICD-10-CM | POA: Diagnosis not present

## 2019-04-14 DIAGNOSIS — D631 Anemia in chronic kidney disease: Secondary | ICD-10-CM | POA: Diagnosis not present

## 2019-04-14 DIAGNOSIS — Z992 Dependence on renal dialysis: Secondary | ICD-10-CM | POA: Diagnosis not present

## 2019-04-16 DIAGNOSIS — N2581 Secondary hyperparathyroidism of renal origin: Secondary | ICD-10-CM | POA: Diagnosis not present

## 2019-04-16 DIAGNOSIS — Z992 Dependence on renal dialysis: Secondary | ICD-10-CM | POA: Diagnosis not present

## 2019-04-16 DIAGNOSIS — N186 End stage renal disease: Secondary | ICD-10-CM | POA: Diagnosis not present

## 2019-04-16 DIAGNOSIS — D509 Iron deficiency anemia, unspecified: Secondary | ICD-10-CM | POA: Diagnosis not present

## 2019-04-16 DIAGNOSIS — D631 Anemia in chronic kidney disease: Secondary | ICD-10-CM | POA: Diagnosis not present

## 2019-04-18 DIAGNOSIS — D509 Iron deficiency anemia, unspecified: Secondary | ICD-10-CM | POA: Diagnosis not present

## 2019-04-18 DIAGNOSIS — N2581 Secondary hyperparathyroidism of renal origin: Secondary | ICD-10-CM | POA: Diagnosis not present

## 2019-04-18 DIAGNOSIS — D631 Anemia in chronic kidney disease: Secondary | ICD-10-CM | POA: Diagnosis not present

## 2019-04-18 DIAGNOSIS — Z992 Dependence on renal dialysis: Secondary | ICD-10-CM | POA: Diagnosis not present

## 2019-04-18 DIAGNOSIS — N186 End stage renal disease: Secondary | ICD-10-CM | POA: Diagnosis not present

## 2019-04-21 DIAGNOSIS — N186 End stage renal disease: Secondary | ICD-10-CM | POA: Diagnosis not present

## 2019-04-21 DIAGNOSIS — D509 Iron deficiency anemia, unspecified: Secondary | ICD-10-CM | POA: Diagnosis not present

## 2019-04-21 DIAGNOSIS — Z992 Dependence on renal dialysis: Secondary | ICD-10-CM | POA: Diagnosis not present

## 2019-04-21 DIAGNOSIS — N2581 Secondary hyperparathyroidism of renal origin: Secondary | ICD-10-CM | POA: Diagnosis not present

## 2019-04-21 DIAGNOSIS — D631 Anemia in chronic kidney disease: Secondary | ICD-10-CM | POA: Diagnosis not present

## 2019-04-23 DIAGNOSIS — Z992 Dependence on renal dialysis: Secondary | ICD-10-CM | POA: Diagnosis not present

## 2019-04-23 DIAGNOSIS — D631 Anemia in chronic kidney disease: Secondary | ICD-10-CM | POA: Diagnosis not present

## 2019-04-23 DIAGNOSIS — N186 End stage renal disease: Secondary | ICD-10-CM | POA: Diagnosis not present

## 2019-04-23 DIAGNOSIS — D509 Iron deficiency anemia, unspecified: Secondary | ICD-10-CM | POA: Diagnosis not present

## 2019-04-23 DIAGNOSIS — N2581 Secondary hyperparathyroidism of renal origin: Secondary | ICD-10-CM | POA: Diagnosis not present

## 2019-04-25 DIAGNOSIS — Z992 Dependence on renal dialysis: Secondary | ICD-10-CM | POA: Diagnosis not present

## 2019-04-25 DIAGNOSIS — D509 Iron deficiency anemia, unspecified: Secondary | ICD-10-CM | POA: Diagnosis not present

## 2019-04-25 DIAGNOSIS — N186 End stage renal disease: Secondary | ICD-10-CM | POA: Diagnosis not present

## 2019-04-25 DIAGNOSIS — D631 Anemia in chronic kidney disease: Secondary | ICD-10-CM | POA: Diagnosis not present

## 2019-04-25 DIAGNOSIS — N2581 Secondary hyperparathyroidism of renal origin: Secondary | ICD-10-CM | POA: Diagnosis not present

## 2019-04-28 DIAGNOSIS — Z992 Dependence on renal dialysis: Secondary | ICD-10-CM | POA: Diagnosis not present

## 2019-04-28 DIAGNOSIS — D509 Iron deficiency anemia, unspecified: Secondary | ICD-10-CM | POA: Diagnosis not present

## 2019-04-28 DIAGNOSIS — D631 Anemia in chronic kidney disease: Secondary | ICD-10-CM | POA: Diagnosis not present

## 2019-04-28 DIAGNOSIS — N186 End stage renal disease: Secondary | ICD-10-CM | POA: Diagnosis not present

## 2019-04-28 DIAGNOSIS — N2581 Secondary hyperparathyroidism of renal origin: Secondary | ICD-10-CM | POA: Diagnosis not present

## 2019-04-30 DIAGNOSIS — Z992 Dependence on renal dialysis: Secondary | ICD-10-CM | POA: Diagnosis not present

## 2019-04-30 DIAGNOSIS — D631 Anemia in chronic kidney disease: Secondary | ICD-10-CM | POA: Diagnosis not present

## 2019-04-30 DIAGNOSIS — N2581 Secondary hyperparathyroidism of renal origin: Secondary | ICD-10-CM | POA: Diagnosis not present

## 2019-04-30 DIAGNOSIS — D509 Iron deficiency anemia, unspecified: Secondary | ICD-10-CM | POA: Diagnosis not present

## 2019-04-30 DIAGNOSIS — N186 End stage renal disease: Secondary | ICD-10-CM | POA: Diagnosis not present

## 2019-05-02 DIAGNOSIS — N186 End stage renal disease: Secondary | ICD-10-CM | POA: Diagnosis not present

## 2019-05-02 DIAGNOSIS — D631 Anemia in chronic kidney disease: Secondary | ICD-10-CM | POA: Diagnosis not present

## 2019-05-02 DIAGNOSIS — N2581 Secondary hyperparathyroidism of renal origin: Secondary | ICD-10-CM | POA: Diagnosis not present

## 2019-05-02 DIAGNOSIS — D509 Iron deficiency anemia, unspecified: Secondary | ICD-10-CM | POA: Diagnosis not present

## 2019-05-02 DIAGNOSIS — Z992 Dependence on renal dialysis: Secondary | ICD-10-CM | POA: Diagnosis not present

## 2019-05-05 DIAGNOSIS — N186 End stage renal disease: Secondary | ICD-10-CM | POA: Diagnosis not present

## 2019-05-05 DIAGNOSIS — D509 Iron deficiency anemia, unspecified: Secondary | ICD-10-CM | POA: Diagnosis not present

## 2019-05-05 DIAGNOSIS — Z992 Dependence on renal dialysis: Secondary | ICD-10-CM | POA: Diagnosis not present

## 2019-05-05 DIAGNOSIS — D631 Anemia in chronic kidney disease: Secondary | ICD-10-CM | POA: Diagnosis not present

## 2019-05-05 DIAGNOSIS — N2581 Secondary hyperparathyroidism of renal origin: Secondary | ICD-10-CM | POA: Diagnosis not present

## 2019-05-06 DIAGNOSIS — I10 Essential (primary) hypertension: Secondary | ICD-10-CM | POA: Diagnosis not present

## 2019-05-06 DIAGNOSIS — E7800 Pure hypercholesterolemia, unspecified: Secondary | ICD-10-CM | POA: Diagnosis not present

## 2019-05-07 DIAGNOSIS — D631 Anemia in chronic kidney disease: Secondary | ICD-10-CM | POA: Diagnosis not present

## 2019-05-07 DIAGNOSIS — N2581 Secondary hyperparathyroidism of renal origin: Secondary | ICD-10-CM | POA: Diagnosis not present

## 2019-05-07 DIAGNOSIS — Z992 Dependence on renal dialysis: Secondary | ICD-10-CM | POA: Diagnosis not present

## 2019-05-07 DIAGNOSIS — N186 End stage renal disease: Secondary | ICD-10-CM | POA: Diagnosis not present

## 2019-05-07 DIAGNOSIS — D509 Iron deficiency anemia, unspecified: Secondary | ICD-10-CM | POA: Diagnosis not present

## 2019-05-09 DIAGNOSIS — D631 Anemia in chronic kidney disease: Secondary | ICD-10-CM | POA: Diagnosis not present

## 2019-05-09 DIAGNOSIS — Z992 Dependence on renal dialysis: Secondary | ICD-10-CM | POA: Diagnosis not present

## 2019-05-09 DIAGNOSIS — N2581 Secondary hyperparathyroidism of renal origin: Secondary | ICD-10-CM | POA: Diagnosis not present

## 2019-05-09 DIAGNOSIS — D509 Iron deficiency anemia, unspecified: Secondary | ICD-10-CM | POA: Diagnosis not present

## 2019-05-09 DIAGNOSIS — N186 End stage renal disease: Secondary | ICD-10-CM | POA: Diagnosis not present

## 2019-05-11 ENCOUNTER — Ambulatory Visit (INDEPENDENT_AMBULATORY_CARE_PROVIDER_SITE_OTHER): Payer: Medicare Other | Admitting: Nurse Practitioner

## 2019-05-11 ENCOUNTER — Encounter (INDEPENDENT_AMBULATORY_CARE_PROVIDER_SITE_OTHER): Payer: Medicare Other

## 2019-05-12 DIAGNOSIS — N186 End stage renal disease: Secondary | ICD-10-CM | POA: Diagnosis not present

## 2019-05-12 DIAGNOSIS — D509 Iron deficiency anemia, unspecified: Secondary | ICD-10-CM | POA: Diagnosis not present

## 2019-05-12 DIAGNOSIS — Z992 Dependence on renal dialysis: Secondary | ICD-10-CM | POA: Diagnosis not present

## 2019-05-12 DIAGNOSIS — D631 Anemia in chronic kidney disease: Secondary | ICD-10-CM | POA: Diagnosis not present

## 2019-05-12 DIAGNOSIS — N2581 Secondary hyperparathyroidism of renal origin: Secondary | ICD-10-CM | POA: Diagnosis not present

## 2019-05-14 DIAGNOSIS — N186 End stage renal disease: Secondary | ICD-10-CM | POA: Diagnosis not present

## 2019-05-14 DIAGNOSIS — Z992 Dependence on renal dialysis: Secondary | ICD-10-CM | POA: Diagnosis not present

## 2019-05-14 DIAGNOSIS — D509 Iron deficiency anemia, unspecified: Secondary | ICD-10-CM | POA: Diagnosis not present

## 2019-05-14 DIAGNOSIS — D631 Anemia in chronic kidney disease: Secondary | ICD-10-CM | POA: Diagnosis not present

## 2019-05-14 DIAGNOSIS — N2581 Secondary hyperparathyroidism of renal origin: Secondary | ICD-10-CM | POA: Diagnosis not present

## 2019-05-16 DIAGNOSIS — D509 Iron deficiency anemia, unspecified: Secondary | ICD-10-CM | POA: Diagnosis not present

## 2019-05-16 DIAGNOSIS — D631 Anemia in chronic kidney disease: Secondary | ICD-10-CM | POA: Diagnosis not present

## 2019-05-16 DIAGNOSIS — N186 End stage renal disease: Secondary | ICD-10-CM | POA: Diagnosis not present

## 2019-05-16 DIAGNOSIS — N2581 Secondary hyperparathyroidism of renal origin: Secondary | ICD-10-CM | POA: Diagnosis not present

## 2019-05-16 DIAGNOSIS — Z992 Dependence on renal dialysis: Secondary | ICD-10-CM | POA: Diagnosis not present

## 2019-05-19 DIAGNOSIS — N186 End stage renal disease: Secondary | ICD-10-CM | POA: Diagnosis not present

## 2019-05-19 DIAGNOSIS — D631 Anemia in chronic kidney disease: Secondary | ICD-10-CM | POA: Diagnosis not present

## 2019-05-19 DIAGNOSIS — Z992 Dependence on renal dialysis: Secondary | ICD-10-CM | POA: Diagnosis not present

## 2019-05-19 DIAGNOSIS — D509 Iron deficiency anemia, unspecified: Secondary | ICD-10-CM | POA: Diagnosis not present

## 2019-05-19 DIAGNOSIS — N2581 Secondary hyperparathyroidism of renal origin: Secondary | ICD-10-CM | POA: Diagnosis not present

## 2019-05-21 DIAGNOSIS — Z992 Dependence on renal dialysis: Secondary | ICD-10-CM | POA: Diagnosis not present

## 2019-05-21 DIAGNOSIS — D509 Iron deficiency anemia, unspecified: Secondary | ICD-10-CM | POA: Diagnosis not present

## 2019-05-21 DIAGNOSIS — N186 End stage renal disease: Secondary | ICD-10-CM | POA: Diagnosis not present

## 2019-05-21 DIAGNOSIS — N2581 Secondary hyperparathyroidism of renal origin: Secondary | ICD-10-CM | POA: Diagnosis not present

## 2019-05-21 DIAGNOSIS — D631 Anemia in chronic kidney disease: Secondary | ICD-10-CM | POA: Diagnosis not present

## 2019-05-23 DIAGNOSIS — Z992 Dependence on renal dialysis: Secondary | ICD-10-CM | POA: Diagnosis not present

## 2019-05-23 DIAGNOSIS — N186 End stage renal disease: Secondary | ICD-10-CM | POA: Diagnosis not present

## 2019-05-23 DIAGNOSIS — D509 Iron deficiency anemia, unspecified: Secondary | ICD-10-CM | POA: Diagnosis not present

## 2019-05-23 DIAGNOSIS — N2581 Secondary hyperparathyroidism of renal origin: Secondary | ICD-10-CM | POA: Diagnosis not present

## 2019-05-23 DIAGNOSIS — D631 Anemia in chronic kidney disease: Secondary | ICD-10-CM | POA: Diagnosis not present

## 2019-05-26 DIAGNOSIS — N2581 Secondary hyperparathyroidism of renal origin: Secondary | ICD-10-CM | POA: Diagnosis not present

## 2019-05-26 DIAGNOSIS — N186 End stage renal disease: Secondary | ICD-10-CM | POA: Diagnosis not present

## 2019-05-26 DIAGNOSIS — D509 Iron deficiency anemia, unspecified: Secondary | ICD-10-CM | POA: Diagnosis not present

## 2019-05-26 DIAGNOSIS — Z992 Dependence on renal dialysis: Secondary | ICD-10-CM | POA: Diagnosis not present

## 2019-05-26 DIAGNOSIS — D631 Anemia in chronic kidney disease: Secondary | ICD-10-CM | POA: Diagnosis not present

## 2019-05-28 DIAGNOSIS — D631 Anemia in chronic kidney disease: Secondary | ICD-10-CM | POA: Diagnosis not present

## 2019-05-28 DIAGNOSIS — N186 End stage renal disease: Secondary | ICD-10-CM | POA: Diagnosis not present

## 2019-05-28 DIAGNOSIS — D509 Iron deficiency anemia, unspecified: Secondary | ICD-10-CM | POA: Diagnosis not present

## 2019-05-28 DIAGNOSIS — Z992 Dependence on renal dialysis: Secondary | ICD-10-CM | POA: Diagnosis not present

## 2019-05-28 DIAGNOSIS — N2581 Secondary hyperparathyroidism of renal origin: Secondary | ICD-10-CM | POA: Diagnosis not present

## 2019-05-30 DIAGNOSIS — D509 Iron deficiency anemia, unspecified: Secondary | ICD-10-CM | POA: Diagnosis not present

## 2019-05-30 DIAGNOSIS — Z992 Dependence on renal dialysis: Secondary | ICD-10-CM | POA: Diagnosis not present

## 2019-05-30 DIAGNOSIS — N2581 Secondary hyperparathyroidism of renal origin: Secondary | ICD-10-CM | POA: Diagnosis not present

## 2019-05-30 DIAGNOSIS — D631 Anemia in chronic kidney disease: Secondary | ICD-10-CM | POA: Diagnosis not present

## 2019-05-30 DIAGNOSIS — N186 End stage renal disease: Secondary | ICD-10-CM | POA: Diagnosis not present

## 2019-06-01 DIAGNOSIS — M79641 Pain in right hand: Secondary | ICD-10-CM | POA: Diagnosis not present

## 2019-06-01 DIAGNOSIS — Z6823 Body mass index (BMI) 23.0-23.9, adult: Secondary | ICD-10-CM | POA: Diagnosis not present

## 2019-06-01 DIAGNOSIS — E7800 Pure hypercholesterolemia, unspecified: Secondary | ICD-10-CM | POA: Diagnosis not present

## 2019-06-01 DIAGNOSIS — I270 Primary pulmonary hypertension: Secondary | ICD-10-CM | POA: Diagnosis not present

## 2019-06-01 DIAGNOSIS — M1812 Unilateral primary osteoarthritis of first carpometacarpal joint, left hand: Secondary | ICD-10-CM | POA: Diagnosis not present

## 2019-06-01 DIAGNOSIS — M1811 Unilateral primary osteoarthritis of first carpometacarpal joint, right hand: Secondary | ICD-10-CM | POA: Diagnosis not present

## 2019-06-01 DIAGNOSIS — Z299 Encounter for prophylactic measures, unspecified: Secondary | ICD-10-CM | POA: Diagnosis not present

## 2019-06-01 DIAGNOSIS — M19042 Primary osteoarthritis, left hand: Secondary | ICD-10-CM | POA: Diagnosis not present

## 2019-06-01 DIAGNOSIS — M19041 Primary osteoarthritis, right hand: Secondary | ICD-10-CM | POA: Diagnosis not present

## 2019-06-01 DIAGNOSIS — M79642 Pain in left hand: Secondary | ICD-10-CM | POA: Diagnosis not present

## 2019-06-02 DIAGNOSIS — N186 End stage renal disease: Secondary | ICD-10-CM | POA: Diagnosis not present

## 2019-06-02 DIAGNOSIS — D631 Anemia in chronic kidney disease: Secondary | ICD-10-CM | POA: Diagnosis not present

## 2019-06-02 DIAGNOSIS — N2581 Secondary hyperparathyroidism of renal origin: Secondary | ICD-10-CM | POA: Diagnosis not present

## 2019-06-02 DIAGNOSIS — Z992 Dependence on renal dialysis: Secondary | ICD-10-CM | POA: Diagnosis not present

## 2019-06-02 DIAGNOSIS — D509 Iron deficiency anemia, unspecified: Secondary | ICD-10-CM | POA: Diagnosis not present

## 2019-06-03 DIAGNOSIS — I10 Essential (primary) hypertension: Secondary | ICD-10-CM | POA: Diagnosis not present

## 2019-06-03 DIAGNOSIS — E7800 Pure hypercholesterolemia, unspecified: Secondary | ICD-10-CM | POA: Diagnosis not present

## 2019-06-04 DIAGNOSIS — D631 Anemia in chronic kidney disease: Secondary | ICD-10-CM | POA: Diagnosis not present

## 2019-06-04 DIAGNOSIS — D509 Iron deficiency anemia, unspecified: Secondary | ICD-10-CM | POA: Diagnosis not present

## 2019-06-04 DIAGNOSIS — N2581 Secondary hyperparathyroidism of renal origin: Secondary | ICD-10-CM | POA: Diagnosis not present

## 2019-06-04 DIAGNOSIS — Z992 Dependence on renal dialysis: Secondary | ICD-10-CM | POA: Diagnosis not present

## 2019-06-04 DIAGNOSIS — N186 End stage renal disease: Secondary | ICD-10-CM | POA: Diagnosis not present

## 2019-06-06 DIAGNOSIS — D509 Iron deficiency anemia, unspecified: Secondary | ICD-10-CM | POA: Diagnosis not present

## 2019-06-06 DIAGNOSIS — N2581 Secondary hyperparathyroidism of renal origin: Secondary | ICD-10-CM | POA: Diagnosis not present

## 2019-06-06 DIAGNOSIS — N186 End stage renal disease: Secondary | ICD-10-CM | POA: Diagnosis not present

## 2019-06-06 DIAGNOSIS — Z992 Dependence on renal dialysis: Secondary | ICD-10-CM | POA: Diagnosis not present

## 2019-06-06 DIAGNOSIS — D631 Anemia in chronic kidney disease: Secondary | ICD-10-CM | POA: Diagnosis not present

## 2019-06-08 DIAGNOSIS — H25813 Combined forms of age-related cataract, bilateral: Secondary | ICD-10-CM | POA: Diagnosis not present

## 2019-06-09 DIAGNOSIS — Z992 Dependence on renal dialysis: Secondary | ICD-10-CM | POA: Diagnosis not present

## 2019-06-09 DIAGNOSIS — D509 Iron deficiency anemia, unspecified: Secondary | ICD-10-CM | POA: Diagnosis not present

## 2019-06-09 DIAGNOSIS — N186 End stage renal disease: Secondary | ICD-10-CM | POA: Diagnosis not present

## 2019-06-09 DIAGNOSIS — D631 Anemia in chronic kidney disease: Secondary | ICD-10-CM | POA: Diagnosis not present

## 2019-06-09 DIAGNOSIS — N2581 Secondary hyperparathyroidism of renal origin: Secondary | ICD-10-CM | POA: Diagnosis not present

## 2019-06-11 DIAGNOSIS — N186 End stage renal disease: Secondary | ICD-10-CM | POA: Diagnosis not present

## 2019-06-11 DIAGNOSIS — Z992 Dependence on renal dialysis: Secondary | ICD-10-CM | POA: Diagnosis not present

## 2019-06-11 DIAGNOSIS — D631 Anemia in chronic kidney disease: Secondary | ICD-10-CM | POA: Diagnosis not present

## 2019-06-11 DIAGNOSIS — N2581 Secondary hyperparathyroidism of renal origin: Secondary | ICD-10-CM | POA: Diagnosis not present

## 2019-06-11 DIAGNOSIS — D509 Iron deficiency anemia, unspecified: Secondary | ICD-10-CM | POA: Diagnosis not present

## 2019-06-13 DIAGNOSIS — Z992 Dependence on renal dialysis: Secondary | ICD-10-CM | POA: Diagnosis not present

## 2019-06-13 DIAGNOSIS — D509 Iron deficiency anemia, unspecified: Secondary | ICD-10-CM | POA: Diagnosis not present

## 2019-06-13 DIAGNOSIS — N2581 Secondary hyperparathyroidism of renal origin: Secondary | ICD-10-CM | POA: Diagnosis not present

## 2019-06-13 DIAGNOSIS — D631 Anemia in chronic kidney disease: Secondary | ICD-10-CM | POA: Diagnosis not present

## 2019-06-13 DIAGNOSIS — N186 End stage renal disease: Secondary | ICD-10-CM | POA: Diagnosis not present

## 2019-06-16 DIAGNOSIS — D509 Iron deficiency anemia, unspecified: Secondary | ICD-10-CM | POA: Diagnosis not present

## 2019-06-16 DIAGNOSIS — D631 Anemia in chronic kidney disease: Secondary | ICD-10-CM | POA: Diagnosis not present

## 2019-06-16 DIAGNOSIS — Z992 Dependence on renal dialysis: Secondary | ICD-10-CM | POA: Diagnosis not present

## 2019-06-16 DIAGNOSIS — N186 End stage renal disease: Secondary | ICD-10-CM | POA: Diagnosis not present

## 2019-06-16 DIAGNOSIS — N2581 Secondary hyperparathyroidism of renal origin: Secondary | ICD-10-CM | POA: Diagnosis not present

## 2019-06-18 DIAGNOSIS — N186 End stage renal disease: Secondary | ICD-10-CM | POA: Diagnosis not present

## 2019-06-18 DIAGNOSIS — D631 Anemia in chronic kidney disease: Secondary | ICD-10-CM | POA: Diagnosis not present

## 2019-06-18 DIAGNOSIS — D509 Iron deficiency anemia, unspecified: Secondary | ICD-10-CM | POA: Diagnosis not present

## 2019-06-18 DIAGNOSIS — N2581 Secondary hyperparathyroidism of renal origin: Secondary | ICD-10-CM | POA: Diagnosis not present

## 2019-06-18 DIAGNOSIS — Z992 Dependence on renal dialysis: Secondary | ICD-10-CM | POA: Diagnosis not present

## 2019-06-20 DIAGNOSIS — D631 Anemia in chronic kidney disease: Secondary | ICD-10-CM | POA: Diagnosis not present

## 2019-06-20 DIAGNOSIS — N186 End stage renal disease: Secondary | ICD-10-CM | POA: Diagnosis not present

## 2019-06-20 DIAGNOSIS — Z992 Dependence on renal dialysis: Secondary | ICD-10-CM | POA: Diagnosis not present

## 2019-06-20 DIAGNOSIS — D509 Iron deficiency anemia, unspecified: Secondary | ICD-10-CM | POA: Diagnosis not present

## 2019-06-20 DIAGNOSIS — N2581 Secondary hyperparathyroidism of renal origin: Secondary | ICD-10-CM | POA: Diagnosis not present

## 2019-06-23 DIAGNOSIS — N186 End stage renal disease: Secondary | ICD-10-CM | POA: Diagnosis not present

## 2019-06-23 DIAGNOSIS — D631 Anemia in chronic kidney disease: Secondary | ICD-10-CM | POA: Diagnosis not present

## 2019-06-23 DIAGNOSIS — Z992 Dependence on renal dialysis: Secondary | ICD-10-CM | POA: Diagnosis not present

## 2019-06-23 DIAGNOSIS — N2581 Secondary hyperparathyroidism of renal origin: Secondary | ICD-10-CM | POA: Diagnosis not present

## 2019-06-23 DIAGNOSIS — D509 Iron deficiency anemia, unspecified: Secondary | ICD-10-CM | POA: Diagnosis not present

## 2019-06-25 DIAGNOSIS — D509 Iron deficiency anemia, unspecified: Secondary | ICD-10-CM | POA: Diagnosis not present

## 2019-06-25 DIAGNOSIS — N186 End stage renal disease: Secondary | ICD-10-CM | POA: Diagnosis not present

## 2019-06-25 DIAGNOSIS — Z992 Dependence on renal dialysis: Secondary | ICD-10-CM | POA: Diagnosis not present

## 2019-06-25 DIAGNOSIS — N2581 Secondary hyperparathyroidism of renal origin: Secondary | ICD-10-CM | POA: Diagnosis not present

## 2019-06-25 DIAGNOSIS — D631 Anemia in chronic kidney disease: Secondary | ICD-10-CM | POA: Diagnosis not present

## 2019-06-27 DIAGNOSIS — Z992 Dependence on renal dialysis: Secondary | ICD-10-CM | POA: Diagnosis not present

## 2019-06-27 DIAGNOSIS — D509 Iron deficiency anemia, unspecified: Secondary | ICD-10-CM | POA: Diagnosis not present

## 2019-06-27 DIAGNOSIS — D631 Anemia in chronic kidney disease: Secondary | ICD-10-CM | POA: Diagnosis not present

## 2019-06-27 DIAGNOSIS — N186 End stage renal disease: Secondary | ICD-10-CM | POA: Diagnosis not present

## 2019-06-27 DIAGNOSIS — N2581 Secondary hyperparathyroidism of renal origin: Secondary | ICD-10-CM | POA: Diagnosis not present

## 2019-06-30 DIAGNOSIS — D631 Anemia in chronic kidney disease: Secondary | ICD-10-CM | POA: Diagnosis not present

## 2019-06-30 DIAGNOSIS — N2581 Secondary hyperparathyroidism of renal origin: Secondary | ICD-10-CM | POA: Diagnosis not present

## 2019-06-30 DIAGNOSIS — D509 Iron deficiency anemia, unspecified: Secondary | ICD-10-CM | POA: Diagnosis not present

## 2019-06-30 DIAGNOSIS — N186 End stage renal disease: Secondary | ICD-10-CM | POA: Diagnosis not present

## 2019-06-30 DIAGNOSIS — Z992 Dependence on renal dialysis: Secondary | ICD-10-CM | POA: Diagnosis not present

## 2019-07-02 DIAGNOSIS — N186 End stage renal disease: Secondary | ICD-10-CM | POA: Diagnosis not present

## 2019-07-02 DIAGNOSIS — I10 Essential (primary) hypertension: Secondary | ICD-10-CM | POA: Diagnosis not present

## 2019-07-02 DIAGNOSIS — Z992 Dependence on renal dialysis: Secondary | ICD-10-CM | POA: Diagnosis not present

## 2019-07-02 DIAGNOSIS — E7800 Pure hypercholesterolemia, unspecified: Secondary | ICD-10-CM | POA: Diagnosis not present

## 2019-07-02 DIAGNOSIS — D631 Anemia in chronic kidney disease: Secondary | ICD-10-CM | POA: Diagnosis not present

## 2019-07-02 DIAGNOSIS — D509 Iron deficiency anemia, unspecified: Secondary | ICD-10-CM | POA: Diagnosis not present

## 2019-07-02 DIAGNOSIS — N2581 Secondary hyperparathyroidism of renal origin: Secondary | ICD-10-CM | POA: Diagnosis not present

## 2019-07-04 DIAGNOSIS — Z992 Dependence on renal dialysis: Secondary | ICD-10-CM | POA: Diagnosis not present

## 2019-07-04 DIAGNOSIS — N186 End stage renal disease: Secondary | ICD-10-CM | POA: Diagnosis not present

## 2019-07-04 DIAGNOSIS — D509 Iron deficiency anemia, unspecified: Secondary | ICD-10-CM | POA: Diagnosis not present

## 2019-07-04 DIAGNOSIS — N2581 Secondary hyperparathyroidism of renal origin: Secondary | ICD-10-CM | POA: Diagnosis not present

## 2019-07-04 DIAGNOSIS — D631 Anemia in chronic kidney disease: Secondary | ICD-10-CM | POA: Diagnosis not present

## 2019-07-07 DIAGNOSIS — N186 End stage renal disease: Secondary | ICD-10-CM | POA: Diagnosis not present

## 2019-07-07 DIAGNOSIS — Z992 Dependence on renal dialysis: Secondary | ICD-10-CM | POA: Diagnosis not present

## 2019-07-07 DIAGNOSIS — D631 Anemia in chronic kidney disease: Secondary | ICD-10-CM | POA: Diagnosis not present

## 2019-07-07 DIAGNOSIS — N2581 Secondary hyperparathyroidism of renal origin: Secondary | ICD-10-CM | POA: Diagnosis not present

## 2019-07-07 DIAGNOSIS — D509 Iron deficiency anemia, unspecified: Secondary | ICD-10-CM | POA: Diagnosis not present

## 2019-07-09 DIAGNOSIS — N186 End stage renal disease: Secondary | ICD-10-CM | POA: Diagnosis not present

## 2019-07-09 DIAGNOSIS — D631 Anemia in chronic kidney disease: Secondary | ICD-10-CM | POA: Diagnosis not present

## 2019-07-09 DIAGNOSIS — D509 Iron deficiency anemia, unspecified: Secondary | ICD-10-CM | POA: Diagnosis not present

## 2019-07-09 DIAGNOSIS — Z992 Dependence on renal dialysis: Secondary | ICD-10-CM | POA: Diagnosis not present

## 2019-07-09 DIAGNOSIS — N2581 Secondary hyperparathyroidism of renal origin: Secondary | ICD-10-CM | POA: Diagnosis not present

## 2019-07-11 DIAGNOSIS — Z992 Dependence on renal dialysis: Secondary | ICD-10-CM | POA: Diagnosis not present

## 2019-07-11 DIAGNOSIS — D631 Anemia in chronic kidney disease: Secondary | ICD-10-CM | POA: Diagnosis not present

## 2019-07-11 DIAGNOSIS — D509 Iron deficiency anemia, unspecified: Secondary | ICD-10-CM | POA: Diagnosis not present

## 2019-07-11 DIAGNOSIS — N2581 Secondary hyperparathyroidism of renal origin: Secondary | ICD-10-CM | POA: Diagnosis not present

## 2019-07-11 DIAGNOSIS — N186 End stage renal disease: Secondary | ICD-10-CM | POA: Diagnosis not present

## 2019-07-14 DIAGNOSIS — Z992 Dependence on renal dialysis: Secondary | ICD-10-CM | POA: Diagnosis not present

## 2019-07-14 DIAGNOSIS — D509 Iron deficiency anemia, unspecified: Secondary | ICD-10-CM | POA: Diagnosis not present

## 2019-07-14 DIAGNOSIS — D631 Anemia in chronic kidney disease: Secondary | ICD-10-CM | POA: Diagnosis not present

## 2019-07-14 DIAGNOSIS — N186 End stage renal disease: Secondary | ICD-10-CM | POA: Diagnosis not present

## 2019-07-14 DIAGNOSIS — N2581 Secondary hyperparathyroidism of renal origin: Secondary | ICD-10-CM | POA: Diagnosis not present

## 2019-07-16 DIAGNOSIS — N186 End stage renal disease: Secondary | ICD-10-CM | POA: Diagnosis not present

## 2019-07-16 DIAGNOSIS — Z992 Dependence on renal dialysis: Secondary | ICD-10-CM | POA: Diagnosis not present

## 2019-07-16 DIAGNOSIS — N2581 Secondary hyperparathyroidism of renal origin: Secondary | ICD-10-CM | POA: Diagnosis not present

## 2019-07-16 DIAGNOSIS — D509 Iron deficiency anemia, unspecified: Secondary | ICD-10-CM | POA: Diagnosis not present

## 2019-07-16 DIAGNOSIS — D631 Anemia in chronic kidney disease: Secondary | ICD-10-CM | POA: Diagnosis not present

## 2019-07-18 DIAGNOSIS — Z992 Dependence on renal dialysis: Secondary | ICD-10-CM | POA: Diagnosis not present

## 2019-07-18 DIAGNOSIS — N2581 Secondary hyperparathyroidism of renal origin: Secondary | ICD-10-CM | POA: Diagnosis not present

## 2019-07-18 DIAGNOSIS — N186 End stage renal disease: Secondary | ICD-10-CM | POA: Diagnosis not present

## 2019-07-18 DIAGNOSIS — D631 Anemia in chronic kidney disease: Secondary | ICD-10-CM | POA: Diagnosis not present

## 2019-07-18 DIAGNOSIS — D509 Iron deficiency anemia, unspecified: Secondary | ICD-10-CM | POA: Diagnosis not present

## 2019-07-20 DIAGNOSIS — Z299 Encounter for prophylactic measures, unspecified: Secondary | ICD-10-CM | POA: Diagnosis not present

## 2019-07-20 DIAGNOSIS — Z0000 Encounter for general adult medical examination without abnormal findings: Secondary | ICD-10-CM | POA: Diagnosis not present

## 2019-07-20 DIAGNOSIS — Z1331 Encounter for screening for depression: Secondary | ICD-10-CM | POA: Diagnosis not present

## 2019-07-20 DIAGNOSIS — R5383 Other fatigue: Secondary | ICD-10-CM | POA: Diagnosis not present

## 2019-07-20 DIAGNOSIS — Z1211 Encounter for screening for malignant neoplasm of colon: Secondary | ICD-10-CM | POA: Diagnosis not present

## 2019-07-20 DIAGNOSIS — Z1339 Encounter for screening examination for other mental health and behavioral disorders: Secondary | ICD-10-CM | POA: Diagnosis not present

## 2019-07-20 DIAGNOSIS — I270 Primary pulmonary hypertension: Secondary | ICD-10-CM | POA: Diagnosis not present

## 2019-07-20 DIAGNOSIS — E7800 Pure hypercholesterolemia, unspecified: Secondary | ICD-10-CM | POA: Diagnosis not present

## 2019-07-20 DIAGNOSIS — D649 Anemia, unspecified: Secondary | ICD-10-CM | POA: Diagnosis not present

## 2019-07-20 DIAGNOSIS — Z7189 Other specified counseling: Secondary | ICD-10-CM | POA: Diagnosis not present

## 2019-07-21 DIAGNOSIS — D509 Iron deficiency anemia, unspecified: Secondary | ICD-10-CM | POA: Diagnosis not present

## 2019-07-21 DIAGNOSIS — N2581 Secondary hyperparathyroidism of renal origin: Secondary | ICD-10-CM | POA: Diagnosis not present

## 2019-07-21 DIAGNOSIS — Z23 Encounter for immunization: Secondary | ICD-10-CM | POA: Diagnosis not present

## 2019-07-21 DIAGNOSIS — D631 Anemia in chronic kidney disease: Secondary | ICD-10-CM | POA: Diagnosis not present

## 2019-07-21 DIAGNOSIS — N186 End stage renal disease: Secondary | ICD-10-CM | POA: Diagnosis not present

## 2019-07-21 DIAGNOSIS — Z992 Dependence on renal dialysis: Secondary | ICD-10-CM | POA: Diagnosis not present

## 2019-07-23 DIAGNOSIS — D631 Anemia in chronic kidney disease: Secondary | ICD-10-CM | POA: Diagnosis not present

## 2019-07-23 DIAGNOSIS — N2581 Secondary hyperparathyroidism of renal origin: Secondary | ICD-10-CM | POA: Diagnosis not present

## 2019-07-23 DIAGNOSIS — Z992 Dependence on renal dialysis: Secondary | ICD-10-CM | POA: Diagnosis not present

## 2019-07-23 DIAGNOSIS — D509 Iron deficiency anemia, unspecified: Secondary | ICD-10-CM | POA: Diagnosis not present

## 2019-07-23 DIAGNOSIS — Z23 Encounter for immunization: Secondary | ICD-10-CM | POA: Diagnosis not present

## 2019-07-23 DIAGNOSIS — N186 End stage renal disease: Secondary | ICD-10-CM | POA: Diagnosis not present

## 2019-07-23 NOTE — H&P (Signed)
Surgical History & Physical  Patient Name: [name] DOB: 11-28-1950  Surgery: Cataract extraction with intraocular lens implant phacoemulsification; Left Eye  Surgeon: [name] MD Surgery Date:  08/07/2019 Pre-Op Date:  07/23/2019  HPI: A 24 Yr. old female patient referred by Dr. Radford Pax for cataract eval 1. 1. The patient complains of difficulty when driving at night, which began 1 years ago. Patient has d/c driving at night due to poor VA with oncoming traffic. Both eyes are affected. The episode is gradual. The patient describes glare symptoms affecting their eyes/vision. Symptoms occur when the patient is driving. Patient feels overall VA blurred all the time. This is negatively affecting the patient's quality of life. OS worse c/w OD. No irritations or discomforts. HPI Completed by Dr. [name]  Medical History: Cataracts  High Blood Pressure Kidney failure due to HBP-genetic-dialysisT,TH,Sat... LDL Constitutional Fatigue  Review of Systems All recorded systems are negative except as noted above.  Social   Never smoked  Medication Aspirin, Isosorbide Dinitrate, Labetolol, Minoxidil, Omeprazole, Renvela, Rena vit, Simvastatin,   Sx/Procedures Kidney transplant left side,   Drug Allergies  Tape, Dilaudid,   History & Physical: Heent:  Cataract, Left eye NECK: supple without bruits LUNGS: lungs clear to auscultation CV: regular rate and rhythm Abdomen: soft and non-tender  Impression & Plan: Assessment: 1.  COMBINED FORMS AGE RELATED CATARACT; Both Eyes (H25.813) 2.  ASTIGMATISM, REGULAR; Both Eyes (H52.223)  Plan: 1.  Cataract accounts for the patient's decreased vision. This visual impairment is not correctable with a tolerable change in glasses or contact lenses. Cataract surgery with an implantation of a new lens should significantly improve the visual and functional status of the patient. Discussed all risks, benefits, alternatives, and potential  complications. Discussed the procedures and recovery. Patient desires to have surgery. A-scan ordered and performed today for intra-ocular lens calculations. The surgery will be performed in order to improve vision for driving, reading, and for eye examinations. Recommend phacoemulsification with intra-ocular lens. Left Eye worse - first. Dilates well - shugarcaine by protocol. Consider toric lens. 2.  Consider toric lens.

## 2019-07-25 DIAGNOSIS — Z23 Encounter for immunization: Secondary | ICD-10-CM | POA: Diagnosis not present

## 2019-07-25 DIAGNOSIS — D509 Iron deficiency anemia, unspecified: Secondary | ICD-10-CM | POA: Diagnosis not present

## 2019-07-25 DIAGNOSIS — D631 Anemia in chronic kidney disease: Secondary | ICD-10-CM | POA: Diagnosis not present

## 2019-07-25 DIAGNOSIS — N2581 Secondary hyperparathyroidism of renal origin: Secondary | ICD-10-CM | POA: Diagnosis not present

## 2019-07-25 DIAGNOSIS — Z992 Dependence on renal dialysis: Secondary | ICD-10-CM | POA: Diagnosis not present

## 2019-07-25 DIAGNOSIS — N186 End stage renal disease: Secondary | ICD-10-CM | POA: Diagnosis not present

## 2019-07-28 DIAGNOSIS — N2581 Secondary hyperparathyroidism of renal origin: Secondary | ICD-10-CM | POA: Diagnosis not present

## 2019-07-28 DIAGNOSIS — Z23 Encounter for immunization: Secondary | ICD-10-CM | POA: Diagnosis not present

## 2019-07-28 DIAGNOSIS — D509 Iron deficiency anemia, unspecified: Secondary | ICD-10-CM | POA: Diagnosis not present

## 2019-07-28 DIAGNOSIS — N186 End stage renal disease: Secondary | ICD-10-CM | POA: Diagnosis not present

## 2019-07-28 DIAGNOSIS — Z992 Dependence on renal dialysis: Secondary | ICD-10-CM | POA: Diagnosis not present

## 2019-07-28 DIAGNOSIS — D631 Anemia in chronic kidney disease: Secondary | ICD-10-CM | POA: Diagnosis not present

## 2019-07-30 DIAGNOSIS — D631 Anemia in chronic kidney disease: Secondary | ICD-10-CM | POA: Diagnosis not present

## 2019-07-30 DIAGNOSIS — Z23 Encounter for immunization: Secondary | ICD-10-CM | POA: Diagnosis not present

## 2019-07-30 DIAGNOSIS — N2581 Secondary hyperparathyroidism of renal origin: Secondary | ICD-10-CM | POA: Diagnosis not present

## 2019-07-30 DIAGNOSIS — N186 End stage renal disease: Secondary | ICD-10-CM | POA: Diagnosis not present

## 2019-07-30 DIAGNOSIS — D509 Iron deficiency anemia, unspecified: Secondary | ICD-10-CM | POA: Diagnosis not present

## 2019-07-30 DIAGNOSIS — Z992 Dependence on renal dialysis: Secondary | ICD-10-CM | POA: Diagnosis not present

## 2019-07-31 DIAGNOSIS — H25812 Combined forms of age-related cataract, left eye: Secondary | ICD-10-CM | POA: Diagnosis not present

## 2019-08-01 DIAGNOSIS — Z23 Encounter for immunization: Secondary | ICD-10-CM | POA: Diagnosis not present

## 2019-08-01 DIAGNOSIS — D631 Anemia in chronic kidney disease: Secondary | ICD-10-CM | POA: Diagnosis not present

## 2019-08-01 DIAGNOSIS — Z992 Dependence on renal dialysis: Secondary | ICD-10-CM | POA: Diagnosis not present

## 2019-08-01 DIAGNOSIS — N186 End stage renal disease: Secondary | ICD-10-CM | POA: Diagnosis not present

## 2019-08-01 DIAGNOSIS — D509 Iron deficiency anemia, unspecified: Secondary | ICD-10-CM | POA: Diagnosis not present

## 2019-08-01 DIAGNOSIS — N2581 Secondary hyperparathyroidism of renal origin: Secondary | ICD-10-CM | POA: Diagnosis not present

## 2019-08-03 ENCOUNTER — Ambulatory Visit (INDEPENDENT_AMBULATORY_CARE_PROVIDER_SITE_OTHER): Payer: Medicare Other

## 2019-08-03 ENCOUNTER — Encounter (INDEPENDENT_AMBULATORY_CARE_PROVIDER_SITE_OTHER): Payer: Self-pay | Admitting: Nurse Practitioner

## 2019-08-03 ENCOUNTER — Ambulatory Visit (INDEPENDENT_AMBULATORY_CARE_PROVIDER_SITE_OTHER): Payer: Medicare Other | Admitting: Nurse Practitioner

## 2019-08-03 ENCOUNTER — Encounter (HOSPITAL_COMMUNITY): Payer: Self-pay

## 2019-08-03 ENCOUNTER — Other Ambulatory Visit: Payer: Self-pay

## 2019-08-03 VITALS — BP 142/79 | HR 67 | Resp 10 | Ht 64.0 in | Wt 132.0 lb

## 2019-08-03 DIAGNOSIS — E782 Mixed hyperlipidemia: Secondary | ICD-10-CM | POA: Diagnosis not present

## 2019-08-03 DIAGNOSIS — N186 End stage renal disease: Secondary | ICD-10-CM

## 2019-08-03 DIAGNOSIS — T829XXS Unspecified complication of cardiac and vascular prosthetic device, implant and graft, sequela: Secondary | ICD-10-CM | POA: Diagnosis not present

## 2019-08-03 DIAGNOSIS — Z992 Dependence on renal dialysis: Secondary | ICD-10-CM | POA: Diagnosis not present

## 2019-08-03 DIAGNOSIS — I10 Essential (primary) hypertension: Secondary | ICD-10-CM | POA: Diagnosis not present

## 2019-08-03 NOTE — Progress Notes (Signed)
SUBJECTIVE:  Patient ID: [name], female    DOB: 07/29/1951, 68 y.o.   MRN: [id number] Chief Complaint  Patient presents with  . Follow-up    HPI  [name] is a 68 y.o. female   The patient returns to the office for follow up regarding problem with the dialysis access. Currently the patient is maintained via a right thigh graft The patient has had multiple failed upper extremity accesses.  The patient notes a significant increase in bleeding time after decannulation.  The patient has also been informed that there is increased recirculation.    The patient denies hand pain or other symptoms consistent with steal phenomena.  No significant arm swelling.  The patient denies redness or swelling at the access site. The patient denies fever or chills at home or while on dialysis.  The patient denies amaurosis fugax or recent TIA symptoms. There are no recent neurological changes noted. The patient denies claudication symptoms or rest pain symptoms. The patient denies history of DVT, PE or superficial thrombophlebitis. The patient denies recent episodes of angina or shortness of breath.   The patient's right thigh graft has a flow volume of 1206.  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  The proximal mid is 2.2 x 2.5 cm.  The mid distal is 2.5 x 2.25 cm.  There is moderately increased velocity at the venous anastomosis which appears to be due to a moderate change in diameter.  There is dilatation of the right common femoral vein with partially occlusive, weblike, echogenic thrombus roughly 2.5 cm superior to the venous anastomosis resulting in a moderate velocity increase to greater than 400 cm/s   Past Medical History:  Diagnosis Date  . Chronic systolic heart failure ([place])   . Coronary atherosclerosis of native coronary artery   . Diseases of tricuspid valve   . End stage renal disease ([place])    dialysis T,Th & Sat  . Mitral  valve insufficiency and aortic valve insufficiency   . Other and unspecified hyperlipidemia   . Secondary cardiomyopathy, unspecified   . Unspecified essential hypertension     Past Surgical History:  Procedure Laterality Date  . AV FISTULA PLACEMENT Right 2012  . AV FISTULA PLACEMENT Left 2004  . INSERTION OF DIALYSIS CATHETER N/A 01/08/2013   Procedure: INSERTION OF DIALYSIS CATHETER;  Surgeon: Angelia Mould, MD;  Location: [facility];  Service: Vascular;  Laterality: N/A;  . KIDNEY TRANSPLANT Left 2002   pt. states transplant lasted 2 yrs.  Marland Kitchen PERIPHERAL VASCULAR CATHETERIZATION Right 08/27/2016   Procedure: A/V Shuntogram/Fistulagram;  Surgeon: Algernon Huxley, MD;  Location: [facility];  Service: Cardiovascular;  Laterality: Right;    Social History   Socioeconomic History  . Marital status: Single    Spouse name: Not on file  . Number of children: Not on file  . Years of education: Not on file  . Highest education level: Not on file  Occupational History  . Not on file  Social Needs  . Financial resource strain: Not on file  . Food insecurity    Worry: Not on file    Inability: Not on file  . Transportation needs    Medical: Not on file    Non-medical: Not on file  Tobacco Use  . Smoking status: Never Smoker  . Smokeless tobacco: Never Used  Substance and Sexual Activity  . Alcohol use: No  . Drug use:  No  . Sexual activity: Not on file  Lifestyle  . Physical activity    Days per week: Not on file    Minutes per session: Not on file  . Stress: Not on file  Relationships  . Social Herbalist on phone: Not on file    Gets together: Not on file    Attends religious service: Not on file    Active member of club or organization: Not on file    Attends meetings of clubs or organizations: Not on file    Relationship status: Not on file  . Intimate partner violence    Fear of current or ex partner: Not on file    Emotionally abused: Not on file     Physically abused: Not on file    Forced sexual activity: Not on file  Other Topics Concern  . Not on file  Social History Narrative  . Not on file    History reviewed. No pertinent family history.  Allergies  Allergen Reactions  . Dilaudid [Hydromorphone] Nausea And Vomiting  . Dilaudid  [Hydromorphone Hcl]   . Tape Rash    Plastic tape     Review of Systems   Review of Systems: Negative Unless Checked Constitutional: [] Weight loss  [] Fever  [] Chills Cardiac: [] Chest pain   []  Atrial Fibrillation  [] Palpitations   [] Shortness of breath when laying flat   [] Shortness of breath with exertion. [] Shortness of breath at rest Vascular:  [] Pain in legs with walking   [] Pain in legs with standing [] Pain in legs when laying flat   [] Claudication    [] Pain in feet when laying flat    [] History of DVT   [] Phlebitis   [] Swelling in legs   [] Varicose veins   [] Non-healing ulcers Pulmonary:   [] Uses home oxygen   [] Productive cough   [] Hemoptysis   [] Wheeze  [] COPD   [] Asthma Neurologic:  [] Dizziness   [] Seizures  [] Blackouts [] History of stroke   [] History of TIA  [] Aphasia   [] Temporary Blindness   [] Weakness or numbness in arm   [] Weakness or numbness in leg Musculoskeletal:   [] Joint swelling   [] Joint pain   [] Low back pain  []  History of Knee Replacement [x] Arthritis [] back Surgeries  []  Spinal Stenosis    Hematologic:  [] Easy bruising  [] Easy bleeding   [] Hypercoagulable state   [x] Anemic Gastrointestinal:  [] Diarrhea   [] Vomiting  [] Gastroesophageal reflux/heartburn   [] Difficulty swallowing. [] Abdominal pain Genitourinary:  [x] Chronic kidney disease   [] Difficult urination  [] Anuric   [] Blood in urine [] Frequent urination  [] Burning with urination   [] Hematuria Skin:  [] Rashes   [] Ulcers [] Wounds Psychological:  [] History of anxiety   []  History of major depression  []  Memory Difficulties      OBJECTIVE:   Physical Exam  BP (!) 142/79 (BP Location: Left Arm, Patient Position:  Sitting, Cuff Size: Normal)   Pulse 67   Resp 10   Ht 5\' 4"  (1.626 m)   Wt 132 lb (59.9 kg)   BMI 22.66 kg/m   Gen: WD/WN, NAD Head: [place]/AT, No temporalis wasting.  Ear/Nose/Throat: Hearing grossly intact, nares w/o erythema or drainage Eyes: PER, EOMI, sclera nonicteric.  Neck: Supple, no masses.  No JVD.  Pulmonary:  Good air movement, no use of accessory muscles.  Cardiac: RRR Vascular: multiple aneurysmal spots, pulsatile thrill and good bruit Vessel Right Left  Radial Palpable Palpable   Gastrointestinal: soft, non-distended. No guarding/no peritoneal signs.  Musculoskeletal: M/S 5/5 throughout.  No deformity or atrophy.  Neurologic: Pain and light touch intact in extremities.  Symmetrical.  Speech is fluent. Motor exam as listed above. Psychiatric: Judgment intact, Mood & affect appropriate for pt's clinical situation. Dermatologic: No Venous rashes. No Ulcers Noted.  No changes consistent with cellulitis. Lymph : No Cervical lymphadenopathy, no lichenification or skin changes of chronic lymphedema.       ASSESSMENT AND PLAN:  1. Complication from renal dialysis device, sequela Recommend:  The patient is experiencing increasing problems with their dialysis access.  Patient should have a shuntogram with the intention for intervention.  The intention for intervention is to restore appropriate flow and prevent thrombosis and possible loss of the access.  As well as improve the quality of dialysis therapy.  The risks, benefits and alternative therapies were reviewed in detail with the patient.  All questions were answered.  The patient agrees to proceed with angio/intervention.      2. Mixed hyperlipidemia Continue statin as ordered and reviewed, no changes at this time   3. Essential hypertension Continue antihypertensive medications as already ordered, these medications have been reviewed and there are no changes at this time.    Current Outpatient Medications on  File Prior to Visit  Medication Sig Dispense Refill  . acetaminophen (TYLENOL) 500 MG tablet Take 1,000 mg by mouth every 6 (six) hours as needed for pain (headache).    Marland Kitchen aspirin 81 MG tablet Take 81 mg by mouth daily.    Marland Kitchen atorvastatin (LIPITOR) 20 MG tablet Take by mouth.    . B Complex-C-Folic Acid (RENAL) 1 MG CAPS Take by mouth.    . B Complex-C-Zn-Folic Acid (NEPHPLEX RX PO) Take 1 tablet by mouth daily.    . cinacalcet (SENSIPAR) 30 MG tablet Take 30 mg by mouth daily after supper.    . docusate sodium (COLACE) 100 MG capsule Take by mouth.    . gabapentin (NEURONTIN) 300 MG capsule     . HYDROcodone-acetaminophen (NORCO/VICODIN) 5-325 MG tablet     . isosorbide mononitrate (IMDUR) 30 MG 24 hr tablet Take 30 mg by mouth daily.    Marland Kitchen labetalol (NORMODYNE) 300 MG tablet Take 300 mg by mouth 2 (two) times daily.    . minoxidil (LONITEN) 2.5 MG tablet Take 2.5 mg by mouth 2 (two) times daily.    Marland Kitchen omeprazole (PRILOSEC) 40 MG capsule     . sevelamer (RENAGEL) 800 MG tablet Take 800-1,600 mg by mouth See admin instructions. Take 2 tablets by mouth with meals and 1 with snacks.    . sevelamer carbonate (RENVELA) 800 MG tablet Take by mouth.    . simvastatin (ZOCOR) 20 MG tablet Take 20 mg by mouth daily.    . traMADol (ULTRAM) 50 MG tablet Take by mouth.     No current facility-administered medications on file prior to visit.     There are no Patient Instructions on file for this visit. No follow-ups on file.   Kris Hartmann, NP  This note was completed with Sales executive.  Any errors are purely unintentional.

## 2019-08-04 ENCOUNTER — Encounter (HOSPITAL_COMMUNITY)
Admission: RE | Admit: 2019-08-04 | Discharge: 2019-08-04 | Disposition: A | Discharge status: Home / Self Care | Payer: Medicare Other | Source: Ambulatory Visit | Attending: Ophthalmology | Admitting: Ophthalmology

## 2019-08-04 ENCOUNTER — Other Ambulatory Visit (HOSPITAL_COMMUNITY)
Admission: RE | Admit: 2019-08-04 | Discharge: 2019-08-04 | Disposition: A | Discharge status: Home / Self Care | Payer: Medicare Other | Source: Ambulatory Visit | Attending: Ophthalmology | Admitting: Ophthalmology

## 2019-08-04 DIAGNOSIS — D631 Anemia in chronic kidney disease: Secondary | ICD-10-CM | POA: Diagnosis not present

## 2019-08-04 DIAGNOSIS — Z01812 Encounter for preprocedural laboratory examination: Secondary | ICD-10-CM | POA: Diagnosis not present

## 2019-08-04 DIAGNOSIS — Z992 Dependence on renal dialysis: Secondary | ICD-10-CM | POA: Diagnosis not present

## 2019-08-04 DIAGNOSIS — D509 Iron deficiency anemia, unspecified: Secondary | ICD-10-CM | POA: Diagnosis not present

## 2019-08-04 DIAGNOSIS — Z23 Encounter for immunization: Secondary | ICD-10-CM | POA: Diagnosis not present

## 2019-08-04 DIAGNOSIS — N186 End stage renal disease: Secondary | ICD-10-CM | POA: Diagnosis not present

## 2019-08-04 DIAGNOSIS — N2581 Secondary hyperparathyroidism of renal origin: Secondary | ICD-10-CM | POA: Diagnosis not present

## 2019-08-04 DIAGNOSIS — Z20828 Contact with and (suspected) exposure to other viral communicable diseases: Secondary | ICD-10-CM | POA: Diagnosis not present

## 2019-08-04 LAB — SARS CORONAVIRUS 2 (TAT 6-24 HRS): SARS Coronavirus 2: NEGATIVE

## 2019-08-05 ENCOUNTER — Telehealth (INDEPENDENT_AMBULATORY_CARE_PROVIDER_SITE_OTHER): Payer: Self-pay

## 2019-08-05 NOTE — Telephone Encounter (Signed)
I attempted to contact the patient to get her scheduled but a message was left for a return call. I did call her dialysis center in [place] and spoke with Joy. Per Caryl Asp I will send the appt for the patient to have her shuntogram with Dr. Lucky Cowboy on 08/10/2019 with a 7:45 am arrival time to the [facility]. Patient will do her Covid testing either the morning of 08/06/2019 before 11:00 am or afternoon between 12:30-2:30 pm at the [facility]. Pre-procedure instructions will be faxed to [facility] attention Joy.

## 2019-08-06 ENCOUNTER — Other Ambulatory Visit: Admission: RE | Admit: 2019-08-06 | Payer: PRIVATE HEALTH INSURANCE | Source: Ambulatory Visit

## 2019-08-06 DIAGNOSIS — N186 End stage renal disease: Secondary | ICD-10-CM | POA: Diagnosis not present

## 2019-08-06 DIAGNOSIS — D631 Anemia in chronic kidney disease: Secondary | ICD-10-CM | POA: Diagnosis not present

## 2019-08-06 DIAGNOSIS — N2581 Secondary hyperparathyroidism of renal origin: Secondary | ICD-10-CM | POA: Diagnosis not present

## 2019-08-06 DIAGNOSIS — Z992 Dependence on renal dialysis: Secondary | ICD-10-CM | POA: Diagnosis not present

## 2019-08-06 DIAGNOSIS — Z23 Encounter for immunization: Secondary | ICD-10-CM | POA: Diagnosis not present

## 2019-08-06 DIAGNOSIS — D509 Iron deficiency anemia, unspecified: Secondary | ICD-10-CM | POA: Diagnosis not present

## 2019-08-07 ENCOUNTER — Ambulatory Visit (HOSPITAL_COMMUNITY): Payer: Medicare Other | Admitting: Anesthesiology

## 2019-08-07 ENCOUNTER — Other Ambulatory Visit: Payer: Self-pay

## 2019-08-07 ENCOUNTER — Encounter (HOSPITAL_COMMUNITY)
Admission: RE | Disposition: A | Discharge status: Home / Self Care | Payer: Self-pay | Source: Home / Self Care | Attending: Ophthalmology

## 2019-08-07 ENCOUNTER — Ambulatory Visit (HOSPITAL_COMMUNITY)
Admission: RE | Admit: 2019-08-07 | Discharge: 2019-08-07 | Disposition: A | Discharge status: Home / Self Care | Payer: Medicare Other | Attending: Ophthalmology | Admitting: Ophthalmology

## 2019-08-07 ENCOUNTER — Encounter (HOSPITAL_COMMUNITY): Payer: Self-pay | Admitting: *Deleted

## 2019-08-07 DIAGNOSIS — H52203 Unspecified astigmatism, bilateral: Secondary | ICD-10-CM | POA: Insufficient documentation

## 2019-08-07 DIAGNOSIS — Z79899 Other long term (current) drug therapy: Secondary | ICD-10-CM | POA: Diagnosis not present

## 2019-08-07 DIAGNOSIS — Z7982 Long term (current) use of aspirin: Secondary | ICD-10-CM | POA: Insufficient documentation

## 2019-08-07 DIAGNOSIS — I120 Hypertensive chronic kidney disease with stage 5 chronic kidney disease or end stage renal disease: Secondary | ICD-10-CM | POA: Insufficient documentation

## 2019-08-07 DIAGNOSIS — H25812 Combined forms of age-related cataract, left eye: Secondary | ICD-10-CM | POA: Diagnosis not present

## 2019-08-07 DIAGNOSIS — Z885 Allergy status to narcotic agent status: Secondary | ICD-10-CM | POA: Diagnosis not present

## 2019-08-07 DIAGNOSIS — D631 Anemia in chronic kidney disease: Secondary | ICD-10-CM | POA: Diagnosis not present

## 2019-08-07 DIAGNOSIS — M1990 Unspecified osteoarthritis, unspecified site: Secondary | ICD-10-CM | POA: Insufficient documentation

## 2019-08-07 DIAGNOSIS — N186 End stage renal disease: Secondary | ICD-10-CM | POA: Insufficient documentation

## 2019-08-07 DIAGNOSIS — Z888 Allergy status to other drugs, medicaments and biological substances status: Secondary | ICD-10-CM | POA: Diagnosis not present

## 2019-08-07 DIAGNOSIS — I10 Essential (primary) hypertension: Secondary | ICD-10-CM | POA: Diagnosis not present

## 2019-08-07 DIAGNOSIS — Z992 Dependence on renal dialysis: Secondary | ICD-10-CM | POA: Diagnosis not present

## 2019-08-07 HISTORY — PX: CATARACT EXTRACTION W/PHACO: SHX586

## 2019-08-07 LAB — POCT I-STAT, CHEM 8
BUN: 27 mg/dL — ABNORMAL HIGH (ref 8–23)
Calcium, Ion: 0.56 mmol/L — CL (ref 1.15–1.40)
Chloride: 108 mmol/L (ref 98–111)
Creatinine, Ser: 5.2 mg/dL — ABNORMAL HIGH (ref 0.44–1.00)
Glucose, Bld: 75 mg/dL (ref 70–99)
HCT: 30 % — ABNORMAL LOW (ref 36.0–46.0)
Hemoglobin: 10.2 g/dL — ABNORMAL LOW (ref 12.0–15.0)
Potassium: 3.5 mmol/L (ref 3.5–5.1)
Sodium: 132 mmol/L — ABNORMAL LOW (ref 135–145)
TCO2: 21 mmol/L — ABNORMAL LOW (ref 22–32)

## 2019-08-07 SURGERY — PHACOEMULSIFICATION, CATARACT, WITH IOL INSERTION
Anesthesia: Monitor Anesthesia Care | Site: Eye | Laterality: Left

## 2019-08-07 MED ORDER — CYCLOPENTOLATE-PHENYLEPHRINE 0.2-1 % OP SOLN
1.0000 [drp] | OPHTHALMIC | Status: AC | PRN
  Administered 2019-08-07 (×3): 1 [drp] via OPHTHALMIC

## 2019-08-07 MED ORDER — SODIUM HYALURONATE 23 MG/ML IO SOLN
INTRAOCULAR | Status: DC | PRN
  Administered 2019-08-07: 0.6 mL via INTRAOCULAR

## 2019-08-07 MED ORDER — EPINEPHRINE PF 1 MG/ML IJ SOLN
INTRAOCULAR | Status: DC | PRN
  Administered 2019-08-07: 12:00:00 500 mL

## 2019-08-07 MED ORDER — TETRACAINE HCL 0.5 % OP SOLN
1.0000 [drp] | OPHTHALMIC | Status: AC | PRN
  Administered 2019-08-07 (×3): 1 [drp] via OPHTHALMIC

## 2019-08-07 MED ORDER — POVIDONE-IODINE 5 % OP SOLN
OPHTHALMIC | Status: DC | PRN
  Administered 2019-08-07: 1 via OPHTHALMIC

## 2019-08-07 MED ORDER — LIDOCAINE HCL 3.5 % OP GEL
1.0000 "application " | Freq: Once | OPHTHALMIC | Status: AC
  Administered 2019-08-07: 1 via OPHTHALMIC

## 2019-08-07 MED ORDER — PROVISC 10 MG/ML IO SOLN
INTRAOCULAR | Status: DC | PRN
  Administered 2019-08-07: 0.85 mL via INTRAOCULAR

## 2019-08-07 MED ORDER — BSS IO SOLN
INTRAOCULAR | Status: DC | PRN
  Administered 2019-08-07: 15 mL

## 2019-08-07 MED ORDER — LIDOCAINE HCL (PF) 1 % IJ SOLN
INTRAOCULAR | Status: DC | PRN
  Administered 2019-08-07: 12:00:00 1 mL via OPHTHALMIC

## 2019-08-07 MED ORDER — PHENYLEPHRINE HCL 2.5 % OP SOLN
1.0000 [drp] | OPHTHALMIC | Status: AC | PRN
  Administered 2019-08-07 (×3): 1 [drp] via OPHTHALMIC

## 2019-08-07 MED ORDER — NEOMYCIN-POLYMYXIN-DEXAMETH 3.5-10000-0.1 OP SUSP
OPHTHALMIC | Status: DC | PRN
  Administered 2019-08-07: 1 [drp] via OPHTHALMIC

## 2019-08-07 SURGICAL SUPPLY — 13 items
CLOTH BEACON ORANGE TIMEOUT ST (SAFETY) ×1 IMPLANT
EYE SHIELD UNIVERSAL CLEAR (GAUZE/BANDAGES/DRESSINGS) ×1 IMPLANT
GLOVE BIOGEL PI IND STRL 7.0 (GLOVE) IMPLANT
GLOVE BIOGEL PI INDICATOR 7.0 (GLOVE) ×2
LENS ALC ACRYL/TECN (Ophthalmic Related) ×1 IMPLANT
NDL HYPO 18GX1.5 BLUNT FILL (NEEDLE) IMPLANT
NEEDLE HYPO 18GX1.5 BLUNT FILL (NEEDLE) ×2 IMPLANT
PAD ARMBOARD 7.5X6 YLW CONV (MISCELLANEOUS) ×2 IMPLANT
SYR TB 1ML LL NO SAFETY (SYRINGE) ×1 IMPLANT
TAPE SURG TRANSPORE 1 IN (GAUZE/BANDAGES/DRESSINGS) IMPLANT
TAPE SURGICAL TRANSPORE 1 IN (GAUZE/BANDAGES/DRESSINGS) ×1
VISCOELASTIC ADDITIONAL (OPHTHALMIC RELATED) ×1 IMPLANT
WATER STERILE IRR 250ML POUR (IV SOLUTION) ×1 IMPLANT

## 2019-08-07 NOTE — Transfer of Care (Signed)
Immediate Anesthesia Transfer of Care Note  Patient: [name]  Procedure(s) Performed: CATARACT EXTRACTION PHACO AND INTRAOCULAR LENS PLACEMENT LEFT EYE (Left Eye)  Patient Location: Short Stay  Anesthesia Type:MAC  Level of Consciousness: awake and alert   Airway & Oxygen Therapy: Patient Spontanous Breathing  Post-op Assessment: Report given to RN  Post vital signs: Reviewed  Last Vitals:  Vitals Value Taken Time  BP    Temp    Pulse    Resp    SpO2      Last Pain:  Vitals:   08/07/19 1114  TempSrc: Oral  PainSc: 0-No pain      Patients Stated Pain Goal: 6 (82/51/89 8421)  Complications: No apparent anesthesia complications

## 2019-08-07 NOTE — Anesthesia Preprocedure Evaluation (Signed)
Anesthesia Evaluation  Patient identified by MRN, date of birth, ID band Patient awake    Reviewed: Allergy & Precautions, NPO status , Patient's Chart, lab work & pertinent test results, reviewed documented beta blocker date and time   Airway Mallampati: III  TM Distance: >3 FB Neck ROM: Full    Dental  (+) Missing   Pulmonary    Pulmonary exam normal breath sounds clear to auscultation       Cardiovascular hypertension (Labetalol), Pt. on medications and Pt. on home beta blockers Normal cardiovascular exam     Neuro/Psych    GI/Hepatic   Endo/Other    Renal/GU ESRF and DialysisRenal disease     Musculoskeletal  (+) Arthritis ,   Abdominal   Peds  Hematology  (+) anemia ,   Anesthesia Other Findings   Reproductive/Obstetrics                            Anesthesia Physical Anesthesia Plan  ASA: IV  Anesthesia Plan: MAC   Post-op Pain Management:    Induction:   PONV Risk Score and Plan:   Airway Management Planned: Natural Airway and Nasal Cannula  Additional Equipment:   Intra-op Plan:   Post-operative Plan:   Informed Consent: I have reviewed the patients History and Physical, chart, labs and discussed the procedure including the risks, benefits and alternatives for the proposed anesthesia with the patient or authorized representative who has indicated his/her understanding and acceptance.       Plan Discussed with: CRNA  Anesthesia Plan Comments:         Anesthesia Quick Evaluation

## 2019-08-07 NOTE — Discharge Instructions (Signed)
Please discharge patient when stable, will follow up today with Dr. Raffaella Edison at the [facility] office immediately following discharge.  Leave shield in place until visit.  All paperwork with discharge instructions will be given at the office. ° °

## 2019-08-07 NOTE — Anesthesia Postprocedure Evaluation (Signed)
Anesthesia Post Note  Patient: [name]  Procedure(s) Performed: CATARACT EXTRACTION PHACO AND INTRAOCULAR LENS PLACEMENT LEFT EYE (Left Eye)  Patient location during evaluation: Short Stay Anesthesia Type: MAC Level of consciousness: awake Pain management: pain level controlled Vital Signs Assessment: post-procedure vital signs reviewed and stable Respiratory status: spontaneous breathing Cardiovascular status: stable Postop Assessment: no apparent nausea or vomiting and able to ambulate Anesthetic complications: no     Last Vitals:  Vitals:   08/07/19 1114  BP: (!) 208/83  Pulse: 61  Resp: 14  Temp: 36.6 C  SpO2: 97%    Last Pain:  Vitals:   08/07/19 1114  TempSrc: Oral  PainSc: 0-No pain                 [name]

## 2019-08-07 NOTE — Op Note (Signed)
Date of procedure: 08/07/19  Pre-operative diagnosis: Visually significant age-related combined cataract, Left Eye (H25.812)  Post-operative diagnosis: Visually significant age-related combined cataract, Left Eye (H25.812)  Procedure: Removal of cataract via phacoemulsification and insertion of intra-ocular lens [employer] PCB00  +23.5D into the capsular bag of the Left Eye  Attending surgeon: Gerda Diss. Linville Decarolis, MD, MA  Anesthesia: MAC, Topical [place]  Complications: None  Estimated Blood Loss: <49m (minimal)  Specimens: None  Implants: As above  Indications:  Visually significant age-related cataract, Left Eye  Procedure:  The patient was seen and identified in the pre-operative area. The operative eye was identified and dilated.  The operative eye was marked.  Topical anesthesia was administered to the operative eye.     The patient was then to the operative suite and placed in the supine position.  A timeout was performed confirming the patient, procedure to be performed, and all other relevant information.   The patient's face was prepped and draped in the usual fashion for intra-ocular surgery.  A lid speculum was placed into the operative eye and the surgical microscope moved into place and focused.  An inferotemporal paracentesis was created using a 20 gauge paracentesis blade.  Shugarcaine was injected into the anterior chamber.  Viscoelastic was injected into the anterior chamber.  A temporal clear-corneal main wound incision was created using a 2.448mmicrokeratome.  A continuous curvilinear capsulorrhexis was initiated using an irrigating cystitome and completed using capsulorrhexis forceps.  Hydrodissection and hydrodeliniation were performed.  Viscoelastic was injected into the anterior chamber.  A phacoemulsification handpiece and a chopper as a second instrument were used to remove the nucleus and epinucleus. The irrigation/aspiration handpiece was used to remove  any remaining cortical material.   The capsular bag was reinflated with viscoelastic, checked, and found to be intact.  The intraocular lens was inserted into the capsular bag.  The irrigation/aspiration handpiece was used to remove any remaining viscoelastic.  The clear corneal wound and paracentesis wounds were then hydrated and checked with Weck-Cels to be watertight.  The lid-speculum and drape was removed, and the patient's face was cleaned with a wet and dry 4x4.  Maxitrol was instilled in the eye before a clear shield was taped over the eye. The patient was taken to the post-operative care unit in good condition, having tolerated the procedure well.  Post-Op Instructions: [facility] and will receive all other orders and instructions.

## 2019-08-07 NOTE — Interval H&P Note (Signed)
History and Physical Interval Note: The H and P was reviewed and updated. The patient was examined.  No changes were found after exam.  The surgical eye was marked.  08/07/2019 11:27 AM  Ann Held  has presented today for surgery, with the diagnosis of nuclear cataract left eye.  The various methods of treatment have been discussed with the patient and family. After consideration of risks, benefits and other options for treatment, the patient has consented to  Procedure(s) with comments: CATARACT EXTRACTION PHACO AND INTRAOCULAR LENS PLACEMENT LEFT EYE (Left) - left as a surgical intervention.  The patient's history has been reviewed, patient examined, no change in status, stable for surgery.  I have reviewed the patient's chart and labs.  Questions were answered to the patient's satisfaction.     [name]

## 2019-08-08 DIAGNOSIS — D509 Iron deficiency anemia, unspecified: Secondary | ICD-10-CM | POA: Diagnosis not present

## 2019-08-08 DIAGNOSIS — D631 Anemia in chronic kidney disease: Secondary | ICD-10-CM | POA: Diagnosis not present

## 2019-08-08 DIAGNOSIS — N186 End stage renal disease: Secondary | ICD-10-CM | POA: Diagnosis not present

## 2019-08-08 DIAGNOSIS — N2581 Secondary hyperparathyroidism of renal origin: Secondary | ICD-10-CM | POA: Diagnosis not present

## 2019-08-08 DIAGNOSIS — Z992 Dependence on renal dialysis: Secondary | ICD-10-CM | POA: Diagnosis not present

## 2019-08-08 DIAGNOSIS — Z23 Encounter for immunization: Secondary | ICD-10-CM | POA: Diagnosis not present

## 2019-08-09 ENCOUNTER — Other Ambulatory Visit (INDEPENDENT_AMBULATORY_CARE_PROVIDER_SITE_OTHER): Payer: Self-pay | Admitting: Nurse Practitioner

## 2019-08-10 ENCOUNTER — Ambulatory Visit
Admission: RE | Admit: 2019-08-10 | Discharge: 2019-08-10 | Disposition: A | Discharge status: Home / Self Care | Payer: Medicare Other | Attending: Vascular Surgery | Admitting: Vascular Surgery

## 2019-08-10 ENCOUNTER — Other Ambulatory Visit: Payer: Self-pay

## 2019-08-10 ENCOUNTER — Encounter
Admission: RE | Disposition: A | Discharge status: Home / Self Care | Payer: Self-pay | Source: Home / Self Care | Attending: Vascular Surgery

## 2019-08-10 DIAGNOSIS — I2510 Atherosclerotic heart disease of native coronary artery without angina pectoris: Secondary | ICD-10-CM | POA: Insufficient documentation

## 2019-08-10 DIAGNOSIS — I5022 Chronic systolic (congestive) heart failure: Secondary | ICD-10-CM | POA: Insufficient documentation

## 2019-08-10 DIAGNOSIS — N186 End stage renal disease: Secondary | ICD-10-CM | POA: Diagnosis not present

## 2019-08-10 DIAGNOSIS — I429 Cardiomyopathy, unspecified: Secondary | ICD-10-CM | POA: Insufficient documentation

## 2019-08-10 DIAGNOSIS — E785 Hyperlipidemia, unspecified: Secondary | ICD-10-CM | POA: Insufficient documentation

## 2019-08-10 DIAGNOSIS — Z79899 Other long term (current) drug therapy: Secondary | ICD-10-CM | POA: Insufficient documentation

## 2019-08-10 DIAGNOSIS — T82868A Thrombosis of vascular prosthetic devices, implants and grafts, initial encounter: Secondary | ICD-10-CM | POA: Diagnosis not present

## 2019-08-10 DIAGNOSIS — Y841 Kidney dialysis as the cause of abnormal reaction of the patient, or of later complication, without mention of misadventure at the time of the procedure: Secondary | ICD-10-CM | POA: Insufficient documentation

## 2019-08-10 DIAGNOSIS — Z992 Dependence on renal dialysis: Secondary | ICD-10-CM | POA: Insufficient documentation

## 2019-08-10 DIAGNOSIS — I132 Hypertensive heart and chronic kidney disease with heart failure and with stage 5 chronic kidney disease, or end stage renal disease: Secondary | ICD-10-CM | POA: Diagnosis not present

## 2019-08-10 DIAGNOSIS — T82898A Other specified complication of vascular prosthetic devices, implants and grafts, initial encounter: Secondary | ICD-10-CM | POA: Diagnosis not present

## 2019-08-10 DIAGNOSIS — Z7982 Long term (current) use of aspirin: Secondary | ICD-10-CM | POA: Diagnosis not present

## 2019-08-10 HISTORY — PX: A/V SHUNTOGRAM: CATH118297

## 2019-08-10 LAB — POTASSIUM (ARMC VASCULAR LAB ONLY): Potassium (ARMC vascular lab): 3.9 (ref 3.5–5.1)

## 2019-08-10 SURGERY — A/V SHUNTOGRAM
Anesthesia: Moderate Sedation

## 2019-08-10 MED ORDER — FENTANYL CITRATE (PF) 100 MCG/2ML IJ SOLN: 12.5000 ug | Freq: Once | INTRAMUSCULAR | Status: DC | PRN

## 2019-08-10 MED ORDER — MIDAZOLAM HCL 2 MG/ML PO SYRP: 8.0000 mg | ORAL_SOLUTION | Freq: Once | ORAL | Status: DC | PRN

## 2019-08-10 MED ORDER — MIDAZOLAM HCL 5 MG/5ML IJ SOLN
INTRAMUSCULAR | Status: AC
  Filled 2019-08-10: qty 5

## 2019-08-10 MED ORDER — ONDANSETRON HCL 4 MG/2ML IJ SOLN: 4.0000 mg | Freq: Four times a day (QID) | INTRAMUSCULAR | Status: DC | PRN

## 2019-08-10 MED ORDER — FENTANYL CITRATE (PF) 100 MCG/2ML IJ SOLN
INTRAMUSCULAR | Status: DC | PRN
  Administered 2019-08-10: 50 ug via INTRAVENOUS

## 2019-08-10 MED ORDER — HEPARIN SODIUM (PORCINE) 1000 UNIT/ML IJ SOLN
INTRAMUSCULAR | Status: AC
  Filled 2019-08-10: qty 1

## 2019-08-10 MED ORDER — SODIUM CHLORIDE 0.9 % IV SOLN: INTRAVENOUS | Status: DC

## 2019-08-10 MED ORDER — CEFAZOLIN SODIUM-DEXTROSE 1-4 GM/50ML-% IV SOLN: 1.0000 g | Freq: Once | INTRAVENOUS | Status: DC

## 2019-08-10 MED ORDER — FENTANYL CITRATE (PF) 100 MCG/2ML IJ SOLN
INTRAMUSCULAR | Status: AC
  Filled 2019-08-10: qty 2

## 2019-08-10 MED ORDER — DIPHENHYDRAMINE HCL 50 MG/ML IJ SOLN: 50.0000 mg | Freq: Once | INTRAMUSCULAR | Status: DC | PRN

## 2019-08-10 MED ORDER — IODIXANOL 320 MG/ML IV SOLN
INTRAVENOUS | Status: DC | PRN
  Administered 2019-08-10: 10:00:00 55 mL via INTRAVENOUS

## 2019-08-10 MED ORDER — HEPARIN SODIUM (PORCINE) 1000 UNIT/ML IJ SOLN
INTRAMUSCULAR | Status: DC | PRN
  Administered 2019-08-10: 3000 [IU] via INTRAVENOUS

## 2019-08-10 MED ORDER — METHYLPREDNISOLONE SODIUM SUCC 125 MG IJ SOLR: 125.0000 mg | Freq: Once | INTRAMUSCULAR | Status: DC | PRN

## 2019-08-10 MED ORDER — FAMOTIDINE 20 MG PO TABS: 40.0000 mg | ORAL_TABLET | Freq: Once | ORAL | Status: DC | PRN

## 2019-08-10 MED ORDER — MIDAZOLAM HCL 2 MG/2ML IJ SOLN
INTRAMUSCULAR | Status: DC | PRN
  Administered 2019-08-10: 2 mg via INTRAVENOUS

## 2019-08-10 SURGICAL SUPPLY — 15 items
BALLN ATG 14X6X80 (BALLOONS) ×2
BALLN ULTRVRSE 10X60X75 (BALLOONS) ×2
BALLOON ATG 14X6X80 (BALLOONS) IMPLANT
BALLOON ULTRVRSE 10X60X75 (BALLOONS) IMPLANT
CANNULA 5F STIFF (CANNULA) ×1 IMPLANT
CATH BEACON 5 .035 65 KMP TIP (CATHETERS) ×1 IMPLANT
COVER PROBE U/S 5X48 (MISCELLANEOUS) ×1 IMPLANT
DEVICE PRESTO INFLATION (MISCELLANEOUS) ×1 IMPLANT
DRAPE BRACHIAL (DRAPES) ×1 IMPLANT
PACK ANGIOGRAPHY (CUSTOM PROCEDURE TRAY) ×2 IMPLANT
SHEATH BRITE TIP 6FRX5.5 (SHEATH) ×1 IMPLANT
SHEATH BRITE TIP 8FRX11 (SHEATH) ×1 IMPLANT
STENT VENOVO 16X80X80 (Permanent Stent) ×1 IMPLANT
SUT MNCRL AB 4-0 PS2 18 (SUTURE) ×1 IMPLANT
WIRE MAGIC TOR.035 180C (WIRE) ×1 IMPLANT

## 2019-08-10 NOTE — Op Note (Signed)
[facility]    OPERATIVE NOTE   PROCEDURE: 1.  Right femoral artery to femoral vein arteriovenous graft cannulation under ultrasound guidance 2.  Right leg shuntogram 3.  Percutaneous transluminal angioplasty of the right iliac vein with 10 mm diameter by 6 cm length balloon 4.  Stent placement to the right iliac vein with 16 mm diameter by 8 cm length stent for greater than 50% residual stenosis after angioplasty  PRE-OPERATIVE DIAGNOSIS: 1. ESRD 2. Malfunctioning, aneurysmal right thigh arteriovenous graft  POST-OPERATIVE DIAGNOSIS: same as above   SURGEON: Brittany Pain, MD  ANESTHESIA: local with MCS  ESTIMATED BLOOD LOSS: 5 cc  FINDING(S): 1. Aneurysmal access sites of both the arterial and venous access locations but no stenosis within the graft itself.  About 6 to 8 cm more proximal than the venous anastomosis was a high-grade stenosis in the iliac vein in the 90% range.  There was significant venous collaterals secondary to the stenosis.  Beyond this, the vessel normalized at the iliac vein confluence and the IVC was patent and normal.  SPECIMEN(S):  None  CONTRAST: 55 cc  FLUORO TIME: 5.4 minutes  MODERATE CONSCIOUS SEDATION TIME:  Approximately 20 minutes using 2 mg of Versed and 50 mcg of Fentanyl  INDICATIONS: [name] is a 68 y.o. female who presents with malfunctioning right thigh arteriovenous graft.  The patient is scheduled for right leg shuntogram.  The patient is aware the risks include but are not limited to: bleeding, infection, thrombosis of the cannulated access, and possible anaphylactic reaction to the contrast.  The patient is aware of the risks of the procedure and elects to proceed forward.  DESCRIPTION: After full informed written consent was obtained, the patient was brought back to the angiography suite and placed supine upon the angiography table.  The patient was connected to monitoring equipment. Moderate conscious  sedation was administered during a face to face encounter throughout the procedure with my supervision of the RN administering medicines and monitoring the patient's vital signs, pulse oximetry, telemetry and mental status throughout from the start of the procedure until the patient was taken to the recovery room The right thigh was prepped and draped in the standard fashion for a percutaneous access intervention.  Under ultrasound guidance, the right femoral artery to femoral vein arteriovenous graft was cannulated with a micropuncture needle under direct ultrasound guidance were it was patent and a permanent image was performed.  The microwire was advanced into the graft and the needle was exchanged for the a microsheath.  I then upsized to a 6 Fr Sheath and imaging was performed.  Hand injections were completed to image the access including the central venous system. This demonstrated Aneurysmal access sites of both the arterial and venous access locations but no stenosis within the graft itself.  About 6 to 8 cm more proximal than the venous anastomosis was a high-grade stenosis in the iliac vein in the 90% range.  There was significant venous collaterals secondary to the stenosis.  Beyond this, the vessel normalized at the iliac vein confluence and the IVC was patent and normal.  Based on the images, this patient will need intervention to the central venous stenosis to salvage the graft. I then gave the patient 3000 units of intravenous heparin.  I then crossed the stenosis with a Magic Tourqe wire.  Based on the imaging, a 10 mm x 6 cm   angioplasty balloon was selected.  The balloon was centered around the  stenosis and inflated to 6 ATM for 1 minute(s).  On completion imaging, a 65-70% residual stenosis was present.   I then upsized to an 8 [place] sheath and selected a 16 mm diameter by 8 cm length Venovo stent.  This was deployed across the lesion taking care to stay a centimeter or 2 away from the iliac  confluence of the IVC and encompassed the entire lesion staying above the femoral head.  This was postdilated with a 14 mm balloon with excellent angiographic completion result and less than 10% residual stenosis.  Based on the completion imaging, no further intervention is necessary.  The wire and balloon were removed from the sheath.  A 4-0 Monocryl purse-string suture was sewn around the sheath.  The sheath was removed while tying down the suture.  A sterile bandage was applied to the puncture site.  COMPLICATIONS: None  CONDITION: Stable   [name]  08/10/2019 10:29 AM    This note was created with Dragon Medical transcription system. Any errors in dictation are purely unintentional.

## 2019-08-10 NOTE — H&P (Signed)
[facility] History & Physical Update  The patient was interviewed and re-examined.  The patient's previous History and Physical has been reviewed and is unchanged.  There is no change in the plan of care. We plan to proceed with the scheduled procedure.  Leotis Pain, MD  08/10/2019, 8:04 AM

## 2019-08-10 NOTE — Discharge Instructions (Signed)
Vascular Access for Hemodialysis        A vascular access is a connection to the blood inside your blood vessels that allows blood to be easily removed from your body and returned to your body during kidney dialysis (hemodialysis). Hemodialysis is a procedure in which a machine outside of the body filters the blood of a person whose kidneys are no longer working properly. There are three types of vascular accesses:  Arteriovenous fistula (AVF). This is a connection between an artery and a vein (usually in the arm) that is made by sewing them together. Blood in the artery flows directly into the vein, causing it to get larger over time. This makes it easier for the vein to be used for hemodialysis. An arteriovenous fistula takes 1-6 months to develop after surgery.  Arteriovenous graft (AVG). This is a connection between an artery and a vein in the arm that is made with a tube. An arteriovenous graft can be used within 2-3 weeks of surgery.  Venous catheter. This is a thin, flexible tube that is placed in a large vein (usually in the neck, chest, or groin). A venous catheter for hemodialysis contains two tubes that come out of the skin. A venous catheter can be used right away. It is usually used as a temporary access if you need hemodialysis before a fistula or graft has developed, or if kidney failure is sudden (acute) and likely to improve without the need for long-term dialysis. It may also be used as a permanent access if a fistula or graft cannot be created. Which type of access is best for me? The type of access that is best for you depends on the size and strength of your veins, your age, and any other health problems that you have, such as diabetes. An ultrasound test may be used to look at your veins to help make this decision. A fistula is usually the preferred type of access. It can last several years and is less likely than the other types of accesses to become infected or to cause a blood  clot within a blood vessel (thrombosis). However, a fistula is not an option for everyone. If your veins are not the right size or if the fistula does not develop properly, a graft may be used instead. Grafts require you to have strong veins. If your veins are not strong enough for a graft, a catheter may be used. Catheters are more likely than fistulas and grafts to become infected or to have a thrombosis. Sometimes, only one type of access is an option. Your health care provider will help you determine which type of access is best for you. How is a vascular access used? The way that the access is used depends on the type of access:  If the access is a fistula or graft, two needles are inserted through the skin into the access before each hemodialysis session. Blood leaves the body through one of the needles and travels through a tube to the hemodialysis machine (dialyzer). Then it flows through another tube and returns to the body through the second needle.  If the access is a catheter, one tube is connected directly to the tube that leads to the dialyzer, and the other tube is connected to a tube that leads away from the dialyzer. Blood leaves the body through one tube and returns to the body through the other. What problems can occur with vascular access?  A blood clot within a blood vessel (thrombosis).   Thrombosis can lead to a narrowing of a blood vessel (stenosis). If thrombosis occurs frequently, another access site may be created as a backup.  Infection.  Heart enlargement (cardiomegaly) and heart failure. Changes in blood flow may cause an increase in blood pressure or heart rate, making your heart work harder to pump blood. These problems are most likely to occur with a venous catheter and least likely to occur with an arteriovenous fistula. How do I care for my vascular access?  Wear a medical alert bracelet. In case of an emergency, this bracelet tells health care providers that you  are a dialysis patient and allows them to care for your veins appropriately. If you have a graft or fistula:  A "bruit" is a noise that is heard with a stethoscope, and a "thrill" is a vibration that is felt over the graft or fistula. The presence of the bruit and thrill indicates that the access is working. You will be taught to feel for the thrill each day. If this is not felt, the access may be clotted. Call your health care provider.  Keep your arm straight and raised (elevated) above your heart while the access site is healing.  You may freely use the arm where your vascular access is located after the site heals. Keep the following in mind: ? Avoid pressure on the arm. ? Avoid lifting heavy objects with the arm. ? Avoid sleeping on the arm. ? Avoid wearing tight-sleeved shirts or jewelry around the graft or fistula.  Do not allow blood pressure monitoring or needle punctures on the side where the graft or fistula is located.  With permission from your health care provider, you may do exercises to help with blood flow through a fistula. These exercises involve squeezing a rubber ball or other soft objects as instructed.  Wash your access site according to directions from your health care provider. If you have a venous catheter:  Keep the insertion site clean and dry at all times.  If you are told you can shower after the site heals, use a protective covering over the catheter to keep it dry.  Follow directions from your health care provider for bandage (dressing) changes.  Ask your health care provider what activities are safe for you. You may be restricted from lifting or making repetitive arm movements on the side with the catheter. Contact a health care provider if:  Swelling around the graft or fistula gets worse.  You develop new pain.  Your catheter gets damaged. Get help right away if:  You have pain, numbness, an unusual pale skin color, or blue fingers or sores at  the tips of your fingers in the hand on the side of your fistula.  You have chills.  You have a fever.  You have pus or other fluid (drainage) at the vascular access site.  You develop skin redness or red streaking on the skin around, above, or below the vascular access.  You have bleeding at the vascular access that cannot be easily controlled.  Your catheter gets pulled out of place.  You feel your heart racing or skipping beats.  You have chest pain. Summary  A vascular access is a connection to the blood inside your blood vessels that allows blood to be easily removed from your body and returned to your body during kidney dialysis (hemodialysis).  There are three types of vascular accesses.The type of access that is best for you depends on the size and strength of your   veins, your age, and any other health problems that you have, such as diabetes. °· A fistula is usually the preferred type of access, although it is not an option for everyone. It can last several years and is less likely than the other types of accesses to become infected or to cause a blood clot within a blood vessel (thrombosis). °· Wear a medical alert bracelet. In case of an emergency, this tells health care providers that you are a dialysis patient. °This information is not intended to replace advice given to you by your health care provider. Make sure you discuss any questions you have with your health care provider. °Document Released: 01/26/2003 Document Revised: 02/24/2019 Document Reviewed: 11/30/2016 °Elsevier Patient Education © 2020 [employer] ° °

## 2019-08-11 DIAGNOSIS — Z992 Dependence on renal dialysis: Secondary | ICD-10-CM | POA: Diagnosis not present

## 2019-08-11 DIAGNOSIS — N186 End stage renal disease: Secondary | ICD-10-CM | POA: Diagnosis not present

## 2019-08-11 DIAGNOSIS — N2581 Secondary hyperparathyroidism of renal origin: Secondary | ICD-10-CM | POA: Diagnosis not present

## 2019-08-11 DIAGNOSIS — Z23 Encounter for immunization: Secondary | ICD-10-CM | POA: Diagnosis not present

## 2019-08-11 DIAGNOSIS — D631 Anemia in chronic kidney disease: Secondary | ICD-10-CM | POA: Diagnosis not present

## 2019-08-11 DIAGNOSIS — D509 Iron deficiency anemia, unspecified: Secondary | ICD-10-CM | POA: Diagnosis not present

## 2019-08-13 DIAGNOSIS — D631 Anemia in chronic kidney disease: Secondary | ICD-10-CM | POA: Diagnosis not present

## 2019-08-13 DIAGNOSIS — H25811 Combined forms of age-related cataract, right eye: Secondary | ICD-10-CM | POA: Diagnosis not present

## 2019-08-13 DIAGNOSIS — D509 Iron deficiency anemia, unspecified: Secondary | ICD-10-CM | POA: Diagnosis not present

## 2019-08-13 DIAGNOSIS — Z992 Dependence on renal dialysis: Secondary | ICD-10-CM | POA: Diagnosis not present

## 2019-08-13 DIAGNOSIS — N186 End stage renal disease: Secondary | ICD-10-CM | POA: Diagnosis not present

## 2019-08-13 DIAGNOSIS — N2581 Secondary hyperparathyroidism of renal origin: Secondary | ICD-10-CM | POA: Diagnosis not present

## 2019-08-13 DIAGNOSIS — Z23 Encounter for immunization: Secondary | ICD-10-CM | POA: Diagnosis not present

## 2019-08-14 NOTE — H&P (Signed)
Surgical History & Physical  Patient Name: [name] DOB: 1951-01-08  Surgery: Cataract extraction with intraocular lens implant phacoemulsification; Right Eye  Surgeon: [name] MD Surgery Date:  08/21/2019 Pre-Op Date:  08/13/2019  HPI: A 29 Yr. old female patient 1. 1. The patient complains of difficulty when viewing TV, reading closed caption, news scrolls on TV, which began 3 months ago. The right eye is affected. The episode is gradual. The condition's severity increased since last visit. Symptoms occur when the patient is inside and outside. This is negatively affecting the patient's quality of life. 2. The patient is returning after cataract post-op. The left eye is affected. Status post cataract post-op, which began 1 week ago: Since the last visit, the affected area is doing well. The patient's vision is improved. Patient is following medication instructions. HPI Completed by Dr. [name]  Medical History: Cataracts High Blood Pressure Kidney failure due to HBP-genetic-dialysisT,TH,Sat... LDL  Review of Systems Constitutional Fatigue All recorded systems are negative except as noted above.  Social   Never smoked  Medication Prednisolon-gatiflox-bromfenac,  Aspirin, Isosorbide Dinitrate, Labetolol, Minoxidil, Omeprazole, Renvela, Rena vit, Simvastatin,   Sx/Procedures Phaco c IOL,  Kidney transplant left side,   Drug Allergies  Tape, Dilaudid,   History & Physical: Heent:  Cataract, Right eye NECK: supple without bruits LUNGS: lungs clear to auscultation CV: regular rate and rhythm Abdomen: soft and non-tender  Impression & Plan: Assessment: 1.  CATARACT EXTRACTION STATUS; Left Eye (Z98.42) 2.  COMBINED FORMS AGE RELATED CATARACT; Right Eye (H25.811) 3.  ASTIGMATISM, REGULAR; Both Eyes (H52.223)  Plan: 1.  1 week after cataract surgery. Doing well with improved vision and normal eye pressure. Call with any problems or concerns. Continue  Gati-Brom-Pred 2x/day for 3 more weeks. 2.  Cataract accounts for the patient's decreased vision. This visual impairment is not correctable with a tolerable change in glasses or contact lenses. Cataract surgery with an implantation of a new lens should significantly improve the visual and functional status of the patient. Discussed all risks, benefits, alternatives, and potential complications. Discussed the procedures and recovery. Patient desires to have surgery. A-scan ordered and performed today for intra-ocular lens calculations. The surgery will be performed in order to improve vision for driving, reading, and for eye examinations. Recommend phacoemulsification with intra-ocular lens. Right Eye. Surgery required to correct imbalance of vision. Dilates well - shugarcaine by protocol. 3.  toric lens discussed - patient defered.

## 2019-08-15 DIAGNOSIS — D631 Anemia in chronic kidney disease: Secondary | ICD-10-CM | POA: Diagnosis not present

## 2019-08-15 DIAGNOSIS — N186 End stage renal disease: Secondary | ICD-10-CM | POA: Diagnosis not present

## 2019-08-15 DIAGNOSIS — Z992 Dependence on renal dialysis: Secondary | ICD-10-CM | POA: Diagnosis not present

## 2019-08-15 DIAGNOSIS — Z23 Encounter for immunization: Secondary | ICD-10-CM | POA: Diagnosis not present

## 2019-08-15 DIAGNOSIS — D509 Iron deficiency anemia, unspecified: Secondary | ICD-10-CM | POA: Diagnosis not present

## 2019-08-15 DIAGNOSIS — N2581 Secondary hyperparathyroidism of renal origin: Secondary | ICD-10-CM | POA: Diagnosis not present

## 2019-08-18 DIAGNOSIS — Z23 Encounter for immunization: Secondary | ICD-10-CM | POA: Diagnosis not present

## 2019-08-18 DIAGNOSIS — D631 Anemia in chronic kidney disease: Secondary | ICD-10-CM | POA: Diagnosis not present

## 2019-08-18 DIAGNOSIS — E7800 Pure hypercholesterolemia, unspecified: Secondary | ICD-10-CM | POA: Diagnosis not present

## 2019-08-18 DIAGNOSIS — Z299 Encounter for prophylactic measures, unspecified: Secondary | ICD-10-CM | POA: Diagnosis not present

## 2019-08-18 DIAGNOSIS — D509 Iron deficiency anemia, unspecified: Secondary | ICD-10-CM | POA: Diagnosis not present

## 2019-08-18 DIAGNOSIS — Z992 Dependence on renal dialysis: Secondary | ICD-10-CM | POA: Diagnosis not present

## 2019-08-18 DIAGNOSIS — N2581 Secondary hyperparathyroidism of renal origin: Secondary | ICD-10-CM | POA: Diagnosis not present

## 2019-08-18 DIAGNOSIS — I10 Essential (primary) hypertension: Secondary | ICD-10-CM | POA: Diagnosis not present

## 2019-08-18 DIAGNOSIS — N186 End stage renal disease: Secondary | ICD-10-CM | POA: Diagnosis not present

## 2019-08-19 ENCOUNTER — Other Ambulatory Visit (HOSPITAL_COMMUNITY)
Admission: RE | Admit: 2019-08-19 | Discharge: 2019-08-19 | Disposition: A | Discharge status: Home / Self Care | Payer: Medicare Other | Source: Ambulatory Visit | Attending: Ophthalmology | Admitting: Ophthalmology

## 2019-08-19 ENCOUNTER — Other Ambulatory Visit (HOSPITAL_COMMUNITY): Payer: PRIVATE HEALTH INSURANCE

## 2019-08-19 ENCOUNTER — Other Ambulatory Visit: Payer: Self-pay

## 2019-08-19 ENCOUNTER — Encounter (HOSPITAL_COMMUNITY)
Admission: RE | Admit: 2019-08-19 | Discharge: 2019-08-19 | Disposition: A | Discharge status: Home / Self Care | Payer: Medicare Other | Source: Ambulatory Visit | Attending: Ophthalmology | Admitting: Ophthalmology

## 2019-08-20 ENCOUNTER — Encounter (HOSPITAL_COMMUNITY): Payer: Self-pay | Admitting: *Deleted

## 2019-08-20 ENCOUNTER — Other Ambulatory Visit (HOSPITAL_COMMUNITY): Payer: PRIVATE HEALTH INSURANCE

## 2019-08-20 ENCOUNTER — Other Ambulatory Visit: Payer: Self-pay

## 2019-08-20 ENCOUNTER — Other Ambulatory Visit (HOSPITAL_COMMUNITY)
Admission: RE | Admit: 2019-08-20 | Discharge: 2019-08-20 | Disposition: A | Discharge status: Home / Self Care | Payer: Medicare Other | Source: Ambulatory Visit | Attending: Ophthalmology | Admitting: Ophthalmology

## 2019-08-20 DIAGNOSIS — N2581 Secondary hyperparathyroidism of renal origin: Secondary | ICD-10-CM | POA: Diagnosis not present

## 2019-08-20 DIAGNOSIS — Z20828 Contact with and (suspected) exposure to other viral communicable diseases: Secondary | ICD-10-CM | POA: Diagnosis not present

## 2019-08-20 DIAGNOSIS — Z01812 Encounter for preprocedural laboratory examination: Secondary | ICD-10-CM | POA: Diagnosis not present

## 2019-08-20 DIAGNOSIS — N186 End stage renal disease: Secondary | ICD-10-CM | POA: Diagnosis not present

## 2019-08-20 DIAGNOSIS — D631 Anemia in chronic kidney disease: Secondary | ICD-10-CM | POA: Diagnosis not present

## 2019-08-20 DIAGNOSIS — D509 Iron deficiency anemia, unspecified: Secondary | ICD-10-CM | POA: Diagnosis not present

## 2019-08-20 DIAGNOSIS — Z992 Dependence on renal dialysis: Secondary | ICD-10-CM | POA: Diagnosis not present

## 2019-08-20 LAB — SARS CORONAVIRUS 2 (TAT 6-24 HRS): SARS Coronavirus 2: NEGATIVE

## 2019-08-21 ENCOUNTER — Ambulatory Visit (HOSPITAL_COMMUNITY): Payer: Medicare Other | Admitting: Anesthesiology

## 2019-08-21 ENCOUNTER — Ambulatory Visit (HOSPITAL_COMMUNITY)
Admission: RE | Admit: 2019-08-21 | Discharge: 2019-08-21 | Disposition: A | Discharge status: Home / Self Care | Payer: Medicare Other | Attending: Ophthalmology | Admitting: Ophthalmology

## 2019-08-21 ENCOUNTER — Encounter (HOSPITAL_COMMUNITY)
Admission: RE | Disposition: A | Discharge status: Home / Self Care | Payer: Self-pay | Source: Home / Self Care | Attending: Ophthalmology

## 2019-08-21 ENCOUNTER — Encounter (HOSPITAL_COMMUNITY): Payer: Self-pay | Admitting: *Deleted

## 2019-08-21 DIAGNOSIS — Z885 Allergy status to narcotic agent status: Secondary | ICD-10-CM | POA: Insufficient documentation

## 2019-08-21 DIAGNOSIS — I120 Hypertensive chronic kidney disease with stage 5 chronic kidney disease or end stage renal disease: Secondary | ICD-10-CM | POA: Insufficient documentation

## 2019-08-21 DIAGNOSIS — Z888 Allergy status to other drugs, medicaments and biological substances status: Secondary | ICD-10-CM | POA: Insufficient documentation

## 2019-08-21 DIAGNOSIS — Z79899 Other long term (current) drug therapy: Secondary | ICD-10-CM | POA: Diagnosis not present

## 2019-08-21 DIAGNOSIS — H52223 Regular astigmatism, bilateral: Secondary | ICD-10-CM | POA: Diagnosis not present

## 2019-08-21 DIAGNOSIS — N186 End stage renal disease: Secondary | ICD-10-CM | POA: Insufficient documentation

## 2019-08-21 DIAGNOSIS — E7800 Pure hypercholesterolemia, unspecified: Secondary | ICD-10-CM | POA: Insufficient documentation

## 2019-08-21 DIAGNOSIS — Z940 Kidney transplant status: Secondary | ICD-10-CM | POA: Diagnosis not present

## 2019-08-21 DIAGNOSIS — M1990 Unspecified osteoarthritis, unspecified site: Secondary | ICD-10-CM | POA: Diagnosis not present

## 2019-08-21 DIAGNOSIS — Z7982 Long term (current) use of aspirin: Secondary | ICD-10-CM | POA: Insufficient documentation

## 2019-08-21 DIAGNOSIS — Z992 Dependence on renal dialysis: Secondary | ICD-10-CM | POA: Insufficient documentation

## 2019-08-21 DIAGNOSIS — H2511 Age-related nuclear cataract, right eye: Secondary | ICD-10-CM | POA: Diagnosis not present

## 2019-08-21 DIAGNOSIS — H25811 Combined forms of age-related cataract, right eye: Secondary | ICD-10-CM | POA: Diagnosis not present

## 2019-08-21 DIAGNOSIS — I2510 Atherosclerotic heart disease of native coronary artery without angina pectoris: Secondary | ICD-10-CM | POA: Insufficient documentation

## 2019-08-21 HISTORY — PX: CATARACT EXTRACTION W/PHACO: SHX586

## 2019-08-21 LAB — POCT I-STAT, CHEM 8
BUN: 27 mg/dL — ABNORMAL HIGH (ref 8–23)
Calcium, Ion: 0.9 mmol/L — ABNORMAL LOW (ref 1.15–1.40)
Chloride: 102 mmol/L (ref 98–111)
Creatinine, Ser: 3.4 mg/dL — ABNORMAL HIGH (ref 0.44–1.00)
Glucose, Bld: 64 mg/dL — ABNORMAL LOW (ref 70–99)
HCT: 26 % — ABNORMAL LOW (ref 36.0–46.0)
Hemoglobin: 8.8 g/dL — ABNORMAL LOW (ref 12.0–15.0)
Potassium: 4.2 mmol/L (ref 3.5–5.1)
Sodium: 139 mmol/L (ref 135–145)
TCO2: 23 mmol/L (ref 22–32)

## 2019-08-21 SURGERY — PHACOEMULSIFICATION, CATARACT, WITH IOL INSERTION
Anesthesia: Monitor Anesthesia Care | Site: Eye | Laterality: Right

## 2019-08-21 MED ORDER — TETRACAINE HCL 0.5 % OP SOLN
1.0000 [drp] | OPHTHALMIC | Status: AC | PRN
  Administered 2019-08-21 (×3): 1 [drp] via OPHTHALMIC

## 2019-08-21 MED ORDER — EPINEPHRINE PF 1 MG/ML IJ SOLN
INTRAOCULAR | Status: DC | PRN
  Administered 2019-08-21: 09:00:00 500 mL

## 2019-08-21 MED ORDER — NEOMYCIN-POLYMYXIN-DEXAMETH 3.5-10000-0.1 OP SUSP
OPHTHALMIC | Status: AC
  Filled 2019-08-21: qty 5

## 2019-08-21 MED ORDER — NEOMYCIN-POLYMYXIN-DEXAMETH 3.5-10000-0.1 OP SUSP
OPHTHALMIC | Status: DC | PRN
  Administered 2019-08-21: 1 [drp] via OPHTHALMIC

## 2019-08-21 MED ORDER — SODIUM HYALURONATE 23 MG/ML IO SOLN
INTRAOCULAR | Status: DC | PRN
  Administered 2019-08-21: 0.6 mL via INTRAOCULAR

## 2019-08-21 MED ORDER — LIDOCAINE HCL 3.5 % OP GEL
OPHTHALMIC | Status: AC
  Filled 2019-08-21: qty 1

## 2019-08-21 MED ORDER — CYCLOPENTOLATE-PHENYLEPHRINE 0.2-1 % OP SOLN
1.0000 [drp] | OPHTHALMIC | Status: AC | PRN
  Administered 2019-08-21 (×3): 1 [drp] via OPHTHALMIC

## 2019-08-21 MED ORDER — PHENYLEPHRINE HCL 2.5 % OP SOLN
1.0000 [drp] | OPHTHALMIC | Status: AC | PRN
  Administered 2019-08-21 (×3): 1 [drp] via OPHTHALMIC

## 2019-08-21 MED ORDER — LIDOCAINE HCL (PF) 1 % IJ SOLN
INTRAOCULAR | Status: DC | PRN
  Administered 2019-08-21: 09:00:00 1 mL via OPHTHALMIC

## 2019-08-21 MED ORDER — MIDAZOLAM HCL 2 MG/2ML IJ SOLN: 0.5000 mg | Freq: Once | INTRAMUSCULAR | Status: DC | PRN

## 2019-08-21 MED ORDER — TETRACAINE HCL 0.5 % OP SOLN
OPHTHALMIC | Status: AC
  Filled 2019-08-21: qty 4

## 2019-08-21 MED ORDER — BSS IO SOLN
INTRAOCULAR | Status: DC | PRN
  Administered 2019-08-21: 15 mL via INTRAOCULAR

## 2019-08-21 MED ORDER — LIDOCAINE HCL 3.5 % OP GEL
1.0000 "application " | Freq: Once | OPHTHALMIC | Status: AC
  Administered 2019-08-21: 1 via OPHTHALMIC

## 2019-08-21 MED ORDER — CYCLOPENTOLATE-PHENYLEPHRINE 0.2-1 % OP SOLN
OPHTHALMIC | Status: AC
  Filled 2019-08-21: qty 2

## 2019-08-21 MED ORDER — PROVISC 10 MG/ML IO SOLN
INTRAOCULAR | Status: DC | PRN
  Administered 2019-08-21: 0.85 mL via INTRAOCULAR

## 2019-08-21 MED ORDER — LIDOCAINE HCL (PF) 1 % IJ SOLN
INTRAMUSCULAR | Status: AC
  Filled 2019-08-21: qty 2

## 2019-08-21 MED ORDER — PROMETHAZINE HCL 25 MG/ML IJ SOLN: 6.2500 mg | INTRAMUSCULAR | Status: DC | PRN

## 2019-08-21 MED ORDER — POVIDONE-IODINE 5 % OP SOLN
OPHTHALMIC | Status: DC | PRN
  Administered 2019-08-21: 1 via OPHTHALMIC

## 2019-08-21 SURGICAL SUPPLY — 12 items
CLOTH BEACON ORANGE TIMEOUT ST (SAFETY) ×1 IMPLANT
EYE SHIELD UNIVERSAL CLEAR (GAUZE/BANDAGES/DRESSINGS) ×1 IMPLANT
GLOVE BIOGEL PI IND STRL 7.0 (GLOVE) IMPLANT
GLOVE BIOGEL PI INDICATOR 7.0 (GLOVE) ×2
LENS ALC ACRYL/TECN (Ophthalmic Related) ×1 IMPLANT
NDL HYPO 18GX1.5 BLUNT FILL (NEEDLE) IMPLANT
NEEDLE HYPO 18GX1.5 BLUNT FILL (NEEDLE) ×2 IMPLANT
PAD ARMBOARD 7.5X6 YLW CONV (MISCELLANEOUS) ×1 IMPLANT
SYR TB 1ML LL NO SAFETY (SYRINGE) ×1 IMPLANT
TAPE PAPER 2X10 WHT MICROPORE (GAUZE/BANDAGES/DRESSINGS) ×1 IMPLANT
VISCOELASTIC ADDITIONAL (OPHTHALMIC RELATED) ×1 IMPLANT
WATER STERILE IRR 250ML POUR (IV SOLUTION) ×1 IMPLANT

## 2019-08-21 NOTE — Anesthesia Preprocedure Evaluation (Signed)
Anesthesia Evaluation  Patient identified by MRN, date of birth, ID band Patient awake    Reviewed: Allergy & Precautions, NPO status , Patient's Chart, lab work & pertinent test results, reviewed documented beta blocker date and time   Airway Mallampati: II  TM Distance: >3 FB Neck ROM: Full    Dental no notable dental hx. (+) Teeth Intact   Pulmonary neg pulmonary ROS,    Pulmonary exam normal breath sounds clear to auscultation       Cardiovascular Exercise Tolerance: Poor hypertension, Pt. on medications and Pt. on home beta blockers + CAD  Normal cardiovascular examII+ Valvular Problems/Murmurs MR  Rhythm:Regular Rate:Normal  On imdur -denies recent CP or MI Known MR/TR    Neuro/Psych negative neurological ROS  negative psych ROS   GI/Hepatic negative GI ROS, Neg liver ROS,   Endo/Other  negative endocrine ROS  Renal/GU ESRF and DialysisRenal disease  negative genitourinary   Musculoskeletal  (+) Arthritis , Osteoarthritis,    Abdominal   Peds negative pediatric ROS (+)  Hematology negative hematology ROS (+) anemia ,   Anesthesia Other Findings   Reproductive/Obstetrics negative OB ROS                             Anesthesia Physical Anesthesia Plan  ASA: IV  Anesthesia Plan: MAC   Post-op Pain Management:    Induction: Intravenous  PONV Risk Score and Plan: 2 and TIVA, Treatment may vary due to age or medical condition, Midazolam and Ondansetron  Airway Management Planned: Nasal Cannula and Simple Face Mask  Additional Equipment:   Intra-op Plan:   Post-operative Plan:   Informed Consent: I have reviewed the patients History and Physical, chart, labs and discussed the procedure including the risks, benefits and alternatives for the proposed anesthesia with the patient or authorized representative who has indicated his/her understanding and acceptance.     Dental  advisory given  Plan Discussed with: CRNA  Anesthesia Plan Comments: (Plan Full PPE use  Plan MAC as tolerated , with GETA as needed d/w pt -WTP with same after Q&A  Second eye -reports no problem with first)        Anesthesia Quick Evaluation

## 2019-08-21 NOTE — Anesthesia Postprocedure Evaluation (Signed)
Anesthesia Post Note  Patient: [name]  Procedure(s) Performed: CATARACT EXTRACTION PHACO AND INTRAOCULAR LENS PLACEMENT RIGHT EYE (CDE: 5.45) (Right Eye)  Patient location during evaluation: Short Stay Anesthesia Type: MAC Level of consciousness: awake and alert Pain management: pain level controlled Vital Signs Assessment: post-procedure vital signs reviewed and stable Respiratory status: spontaneous breathing Cardiovascular status: stable Postop Assessment: no apparent nausea or vomiting Anesthetic complications: no     Last Vitals:  Vitals:   08/21/19 0831  BP: (!) 165/62  Pulse: 66  Resp: 20  Temp: 36.6 C    Last Pain:  Vitals:   08/21/19 0831  TempSrc: Oral  PainSc: 0-No pain                 Everette Rank

## 2019-08-21 NOTE — Discharge Instructions (Signed)
Please discharge patient when stable, will follow up today with Dr. Phelan Schadt at the [facility] office immediately following discharge.  Leave shield in place until visit.  All paperwork with discharge instructions will be given at the office. ° °

## 2019-08-21 NOTE — Op Note (Signed)
Date of procedure: 08/21/19  Pre-operative diagnosis:  Visually significant combined form age-related cataract, Right Eye (H25.811)  Post-operative diagnosis:  Visually significant combined form age-related cataract, Right Eye (H25.811)  Procedure: Removal of cataract via phacoemulsification and insertion of intra-ocular lens Wynetta Emery and [employer] PCB00  +22.5D into the capsular bag of the Right Eye  Attending surgeon: Gerda Diss. Dorian Duval, MD, MA  Anesthesia: MAC, Topical Akten  Complications: None  Estimated Blood Loss: <53m (minimal)  Specimens: None  Implants: As above  Indications:  Visually significant age-related cataract, Right Eye  Procedure:  The patient was seen and identified in the pre-operative area. The operative eye was identified and dilated.  The operative eye was marked.  Topical anesthesia was administered to the operative eye.     The patient was then to the operative suite and placed in the supine position.  A timeout was performed confirming the patient, procedure to be performed, and all other relevant information.   The patient's face was prepped and draped in the usual fashion for intra-ocular surgery.  A lid speculum was placed into the operative eye and the surgical microscope moved into place and focused.  A superotemporal paracentesis was created using a 20 gauge paracentesis blade.  Shugarcaine was injected into the anterior chamber.  Viscoelastic was injected into the anterior chamber.  A temporal clear-corneal main wound incision was created using a 2.439mmicrokeratome.  A continuous curvilinear capsulorrhexis was initiated using an irrigating cystitome and completed using capsulorrhexis forceps.  Hydrodissection and hydrodeliniation were performed.  Viscoelastic was injected into the anterior chamber.  A phacoemulsification handpiece and a chopper as a second instrument were used to remove the nucleus and epinucleus. The irrigation/aspiration handpiece was  used to remove any remaining cortical material.   The capsular bag was reinflated with viscoelastic, checked, and found to be intact.  The intraocular lens was inserted into the capsular bag.  The irrigation/aspiration handpiece was used to remove any remaining viscoelastic.  The clear corneal wound and paracentesis wounds were then hydrated and checked with Weck-Cels to be watertight.  The lid-speculum and drape was removed, and the patient's face was cleaned with a wet and dry 4x4.  Maxitrol was instilled in the eye before a clear shield was taped over the eye. The patient was taken to the post-operative care unit in good condition, having tolerated the procedure well.  Post-Op Instructions: [facility] and will receive all other orders and instructions.

## 2019-08-21 NOTE — Transfer of Care (Signed)
Immediate Anesthesia Transfer of Care Note  Patient: [name]  Procedure(s) Performed: CATARACT EXTRACTION PHACO AND INTRAOCULAR LENS PLACEMENT RIGHT EYE (CDE: 5.45) (Right Eye)  Patient Location short stay  Anesthesia Type:MAC  Level of Consciousness: awake, alert , oriented and patient cooperative  Airway & Oxygen Therapy: Patient Spontanous Breathing  Post-op Assessment: Report given to RN and Post -op Vital signs reviewed and stable  Post vital signs: Reviewed and stable  Last Vitals:  Vitals Value Taken Time  BP    Temp    Pulse    Resp    SpO2      Last Pain:  Vitals:   08/21/19 0831  TempSrc: Oral  PainSc: 0-No pain      Patients Stated Pain Goal: 6 ([id number])  Complications: No apparent anesthesia complications

## 2019-08-21 NOTE — Interval H&P Note (Signed)
History and Physical Interval Note: The H and P was reviewed and updated. The patient was examined.  No changes were found after exam.  The surgical eye was marked.  08/21/2019 9:11 AM  [name]  has presented today for surgery, with the diagnosis of nuclear cataract right eye.  The various methods of treatment have been discussed with the patient and family. After consideration of risks, benefits and other options for treatment, the patient has consented to  Procedure(s): CATARACT EXTRACTION PHACO AND INTRAOCULAR LENS PLACEMENT RIGHT EYE (Right) as a surgical intervention.  The patient's history has been reviewed, patient examined, no change in status, stable for surgery.  I have reviewed the patient's chart and labs.  Questions were answered to the patient's satisfaction.     [name]

## 2019-08-22 DIAGNOSIS — D509 Iron deficiency anemia, unspecified: Secondary | ICD-10-CM | POA: Diagnosis not present

## 2019-08-22 DIAGNOSIS — D631 Anemia in chronic kidney disease: Secondary | ICD-10-CM | POA: Diagnosis not present

## 2019-08-22 DIAGNOSIS — N2581 Secondary hyperparathyroidism of renal origin: Secondary | ICD-10-CM | POA: Diagnosis not present

## 2019-08-22 DIAGNOSIS — Z992 Dependence on renal dialysis: Secondary | ICD-10-CM | POA: Diagnosis not present

## 2019-08-22 DIAGNOSIS — N186 End stage renal disease: Secondary | ICD-10-CM | POA: Diagnosis not present

## 2019-08-24 ENCOUNTER — Encounter (HOSPITAL_COMMUNITY): Payer: Self-pay | Admitting: Ophthalmology

## 2019-08-25 DIAGNOSIS — N186 End stage renal disease: Secondary | ICD-10-CM | POA: Diagnosis not present

## 2019-08-25 DIAGNOSIS — N2581 Secondary hyperparathyroidism of renal origin: Secondary | ICD-10-CM | POA: Diagnosis not present

## 2019-08-25 DIAGNOSIS — D509 Iron deficiency anemia, unspecified: Secondary | ICD-10-CM | POA: Diagnosis not present

## 2019-08-25 DIAGNOSIS — D631 Anemia in chronic kidney disease: Secondary | ICD-10-CM | POA: Diagnosis not present

## 2019-08-25 DIAGNOSIS — Z992 Dependence on renal dialysis: Secondary | ICD-10-CM | POA: Diagnosis not present

## 2019-08-27 DIAGNOSIS — N186 End stage renal disease: Secondary | ICD-10-CM | POA: Diagnosis not present

## 2019-08-27 DIAGNOSIS — D509 Iron deficiency anemia, unspecified: Secondary | ICD-10-CM | POA: Diagnosis not present

## 2019-08-27 DIAGNOSIS — N2581 Secondary hyperparathyroidism of renal origin: Secondary | ICD-10-CM | POA: Diagnosis not present

## 2019-08-27 DIAGNOSIS — Z992 Dependence on renal dialysis: Secondary | ICD-10-CM | POA: Diagnosis not present

## 2019-08-27 DIAGNOSIS — D631 Anemia in chronic kidney disease: Secondary | ICD-10-CM | POA: Diagnosis not present

## 2019-08-29 DIAGNOSIS — N2581 Secondary hyperparathyroidism of renal origin: Secondary | ICD-10-CM | POA: Diagnosis not present

## 2019-08-29 DIAGNOSIS — Z992 Dependence on renal dialysis: Secondary | ICD-10-CM | POA: Diagnosis not present

## 2019-08-29 DIAGNOSIS — D631 Anemia in chronic kidney disease: Secondary | ICD-10-CM | POA: Diagnosis not present

## 2019-08-29 DIAGNOSIS — N186 End stage renal disease: Secondary | ICD-10-CM | POA: Diagnosis not present

## 2019-08-29 DIAGNOSIS — D509 Iron deficiency anemia, unspecified: Secondary | ICD-10-CM | POA: Diagnosis not present

## 2019-09-01 DIAGNOSIS — D509 Iron deficiency anemia, unspecified: Secondary | ICD-10-CM | POA: Diagnosis not present

## 2019-09-01 DIAGNOSIS — N2581 Secondary hyperparathyroidism of renal origin: Secondary | ICD-10-CM | POA: Diagnosis not present

## 2019-09-01 DIAGNOSIS — D631 Anemia in chronic kidney disease: Secondary | ICD-10-CM | POA: Diagnosis not present

## 2019-09-01 DIAGNOSIS — Z992 Dependence on renal dialysis: Secondary | ICD-10-CM | POA: Diagnosis not present

## 2019-09-01 DIAGNOSIS — N186 End stage renal disease: Secondary | ICD-10-CM | POA: Diagnosis not present

## 2019-09-03 DIAGNOSIS — N2581 Secondary hyperparathyroidism of renal origin: Secondary | ICD-10-CM | POA: Diagnosis not present

## 2019-09-03 DIAGNOSIS — D509 Iron deficiency anemia, unspecified: Secondary | ICD-10-CM | POA: Diagnosis not present

## 2019-09-03 DIAGNOSIS — Z992 Dependence on renal dialysis: Secondary | ICD-10-CM | POA: Diagnosis not present

## 2019-09-03 DIAGNOSIS — D631 Anemia in chronic kidney disease: Secondary | ICD-10-CM | POA: Diagnosis not present

## 2019-09-03 DIAGNOSIS — N186 End stage renal disease: Secondary | ICD-10-CM | POA: Diagnosis not present

## 2019-09-04 DIAGNOSIS — Z1231 Encounter for screening mammogram for malignant neoplasm of breast: Secondary | ICD-10-CM | POA: Diagnosis not present

## 2019-09-05 DIAGNOSIS — N2581 Secondary hyperparathyroidism of renal origin: Secondary | ICD-10-CM | POA: Diagnosis not present

## 2019-09-05 DIAGNOSIS — D631 Anemia in chronic kidney disease: Secondary | ICD-10-CM | POA: Diagnosis not present

## 2019-09-05 DIAGNOSIS — Z992 Dependence on renal dialysis: Secondary | ICD-10-CM | POA: Diagnosis not present

## 2019-09-05 DIAGNOSIS — N186 End stage renal disease: Secondary | ICD-10-CM | POA: Diagnosis not present

## 2019-09-05 DIAGNOSIS — D509 Iron deficiency anemia, unspecified: Secondary | ICD-10-CM | POA: Diagnosis not present

## 2019-09-08 DIAGNOSIS — D631 Anemia in chronic kidney disease: Secondary | ICD-10-CM | POA: Diagnosis not present

## 2019-09-08 DIAGNOSIS — D509 Iron deficiency anemia, unspecified: Secondary | ICD-10-CM | POA: Diagnosis not present

## 2019-09-08 DIAGNOSIS — N186 End stage renal disease: Secondary | ICD-10-CM | POA: Diagnosis not present

## 2019-09-08 DIAGNOSIS — N2581 Secondary hyperparathyroidism of renal origin: Secondary | ICD-10-CM | POA: Diagnosis not present

## 2019-09-08 DIAGNOSIS — Z992 Dependence on renal dialysis: Secondary | ICD-10-CM | POA: Diagnosis not present

## 2019-09-10 DIAGNOSIS — N186 End stage renal disease: Secondary | ICD-10-CM | POA: Diagnosis not present

## 2019-09-10 DIAGNOSIS — Z992 Dependence on renal dialysis: Secondary | ICD-10-CM | POA: Diagnosis not present

## 2019-09-10 DIAGNOSIS — N2581 Secondary hyperparathyroidism of renal origin: Secondary | ICD-10-CM | POA: Diagnosis not present

## 2019-09-10 DIAGNOSIS — D509 Iron deficiency anemia, unspecified: Secondary | ICD-10-CM | POA: Diagnosis not present

## 2019-09-10 DIAGNOSIS — D631 Anemia in chronic kidney disease: Secondary | ICD-10-CM | POA: Diagnosis not present

## 2019-09-12 DIAGNOSIS — N186 End stage renal disease: Secondary | ICD-10-CM | POA: Diagnosis not present

## 2019-09-12 DIAGNOSIS — D509 Iron deficiency anemia, unspecified: Secondary | ICD-10-CM | POA: Diagnosis not present

## 2019-09-12 DIAGNOSIS — D631 Anemia in chronic kidney disease: Secondary | ICD-10-CM | POA: Diagnosis not present

## 2019-09-12 DIAGNOSIS — Z992 Dependence on renal dialysis: Secondary | ICD-10-CM | POA: Diagnosis not present

## 2019-09-12 DIAGNOSIS — N2581 Secondary hyperparathyroidism of renal origin: Secondary | ICD-10-CM | POA: Diagnosis not present

## 2019-09-15 DIAGNOSIS — N2581 Secondary hyperparathyroidism of renal origin: Secondary | ICD-10-CM | POA: Diagnosis not present

## 2019-09-15 DIAGNOSIS — Z992 Dependence on renal dialysis: Secondary | ICD-10-CM | POA: Diagnosis not present

## 2019-09-15 DIAGNOSIS — N186 End stage renal disease: Secondary | ICD-10-CM | POA: Diagnosis not present

## 2019-09-15 DIAGNOSIS — D631 Anemia in chronic kidney disease: Secondary | ICD-10-CM | POA: Diagnosis not present

## 2019-09-15 DIAGNOSIS — D509 Iron deficiency anemia, unspecified: Secondary | ICD-10-CM | POA: Diagnosis not present

## 2019-09-17 DIAGNOSIS — Z992 Dependence on renal dialysis: Secondary | ICD-10-CM | POA: Diagnosis not present

## 2019-09-17 DIAGNOSIS — D631 Anemia in chronic kidney disease: Secondary | ICD-10-CM | POA: Diagnosis not present

## 2019-09-17 DIAGNOSIS — N2581 Secondary hyperparathyroidism of renal origin: Secondary | ICD-10-CM | POA: Diagnosis not present

## 2019-09-17 DIAGNOSIS — N186 End stage renal disease: Secondary | ICD-10-CM | POA: Diagnosis not present

## 2019-09-17 DIAGNOSIS — D509 Iron deficiency anemia, unspecified: Secondary | ICD-10-CM | POA: Diagnosis not present

## 2019-09-19 DIAGNOSIS — N186 End stage renal disease: Secondary | ICD-10-CM | POA: Diagnosis not present

## 2019-09-19 DIAGNOSIS — D509 Iron deficiency anemia, unspecified: Secondary | ICD-10-CM | POA: Diagnosis not present

## 2019-09-19 DIAGNOSIS — Z992 Dependence on renal dialysis: Secondary | ICD-10-CM | POA: Diagnosis not present

## 2019-09-19 DIAGNOSIS — D631 Anemia in chronic kidney disease: Secondary | ICD-10-CM | POA: Diagnosis not present

## 2019-09-19 DIAGNOSIS — N2581 Secondary hyperparathyroidism of renal origin: Secondary | ICD-10-CM | POA: Diagnosis not present

## 2019-09-21 ENCOUNTER — Encounter (INDEPENDENT_AMBULATORY_CARE_PROVIDER_SITE_OTHER): Payer: Medicare Other

## 2019-09-21 ENCOUNTER — Ambulatory Visit (INDEPENDENT_AMBULATORY_CARE_PROVIDER_SITE_OTHER): Payer: Medicare Other | Admitting: Nurse Practitioner

## 2019-09-22 DIAGNOSIS — Z992 Dependence on renal dialysis: Secondary | ICD-10-CM | POA: Diagnosis not present

## 2019-09-22 DIAGNOSIS — D631 Anemia in chronic kidney disease: Secondary | ICD-10-CM | POA: Diagnosis not present

## 2019-09-22 DIAGNOSIS — D509 Iron deficiency anemia, unspecified: Secondary | ICD-10-CM | POA: Diagnosis not present

## 2019-09-22 DIAGNOSIS — N2581 Secondary hyperparathyroidism of renal origin: Secondary | ICD-10-CM | POA: Diagnosis not present

## 2019-09-22 DIAGNOSIS — N186 End stage renal disease: Secondary | ICD-10-CM | POA: Diagnosis not present

## 2019-09-23 ENCOUNTER — Other Ambulatory Visit (INDEPENDENT_AMBULATORY_CARE_PROVIDER_SITE_OTHER): Payer: Self-pay | Admitting: Vascular Surgery

## 2019-09-23 DIAGNOSIS — Z95820 Peripheral vascular angioplasty status with implants and grafts: Secondary | ICD-10-CM

## 2019-09-23 DIAGNOSIS — N186 End stage renal disease: Secondary | ICD-10-CM

## 2019-09-24 DIAGNOSIS — Z992 Dependence on renal dialysis: Secondary | ICD-10-CM | POA: Diagnosis not present

## 2019-09-24 DIAGNOSIS — D631 Anemia in chronic kidney disease: Secondary | ICD-10-CM | POA: Diagnosis not present

## 2019-09-24 DIAGNOSIS — N186 End stage renal disease: Secondary | ICD-10-CM | POA: Diagnosis not present

## 2019-09-24 DIAGNOSIS — N2581 Secondary hyperparathyroidism of renal origin: Secondary | ICD-10-CM | POA: Diagnosis not present

## 2019-09-24 DIAGNOSIS — D509 Iron deficiency anemia, unspecified: Secondary | ICD-10-CM | POA: Diagnosis not present

## 2019-09-26 DIAGNOSIS — N2581 Secondary hyperparathyroidism of renal origin: Secondary | ICD-10-CM | POA: Diagnosis not present

## 2019-09-26 DIAGNOSIS — Z992 Dependence on renal dialysis: Secondary | ICD-10-CM | POA: Diagnosis not present

## 2019-09-26 DIAGNOSIS — D509 Iron deficiency anemia, unspecified: Secondary | ICD-10-CM | POA: Diagnosis not present

## 2019-09-26 DIAGNOSIS — D631 Anemia in chronic kidney disease: Secondary | ICD-10-CM | POA: Diagnosis not present

## 2019-09-26 DIAGNOSIS — N186 End stage renal disease: Secondary | ICD-10-CM | POA: Diagnosis not present

## 2019-09-28 ENCOUNTER — Encounter (INDEPENDENT_AMBULATORY_CARE_PROVIDER_SITE_OTHER): Payer: Medicare Other

## 2019-09-28 ENCOUNTER — Ambulatory Visit (INDEPENDENT_AMBULATORY_CARE_PROVIDER_SITE_OTHER): Payer: Medicare Other | Admitting: Nurse Practitioner

## 2019-09-29 DIAGNOSIS — N2581 Secondary hyperparathyroidism of renal origin: Secondary | ICD-10-CM | POA: Diagnosis not present

## 2019-09-29 DIAGNOSIS — N186 End stage renal disease: Secondary | ICD-10-CM | POA: Diagnosis not present

## 2019-09-29 DIAGNOSIS — D509 Iron deficiency anemia, unspecified: Secondary | ICD-10-CM | POA: Diagnosis not present

## 2019-09-29 DIAGNOSIS — Z992 Dependence on renal dialysis: Secondary | ICD-10-CM | POA: Diagnosis not present

## 2019-09-29 DIAGNOSIS — D631 Anemia in chronic kidney disease: Secondary | ICD-10-CM | POA: Diagnosis not present

## 2019-10-01 DIAGNOSIS — N186 End stage renal disease: Secondary | ICD-10-CM | POA: Diagnosis not present

## 2019-10-01 DIAGNOSIS — N2581 Secondary hyperparathyroidism of renal origin: Secondary | ICD-10-CM | POA: Diagnosis not present

## 2019-10-01 DIAGNOSIS — Z992 Dependence on renal dialysis: Secondary | ICD-10-CM | POA: Diagnosis not present

## 2019-10-01 DIAGNOSIS — D509 Iron deficiency anemia, unspecified: Secondary | ICD-10-CM | POA: Diagnosis not present

## 2019-10-01 DIAGNOSIS — D631 Anemia in chronic kidney disease: Secondary | ICD-10-CM | POA: Diagnosis not present

## 2019-10-03 DIAGNOSIS — D509 Iron deficiency anemia, unspecified: Secondary | ICD-10-CM | POA: Diagnosis not present

## 2019-10-03 DIAGNOSIS — Z992 Dependence on renal dialysis: Secondary | ICD-10-CM | POA: Diagnosis not present

## 2019-10-03 DIAGNOSIS — D631 Anemia in chronic kidney disease: Secondary | ICD-10-CM | POA: Diagnosis not present

## 2019-10-03 DIAGNOSIS — N2581 Secondary hyperparathyroidism of renal origin: Secondary | ICD-10-CM | POA: Diagnosis not present

## 2019-10-03 DIAGNOSIS — N186 End stage renal disease: Secondary | ICD-10-CM | POA: Diagnosis not present

## 2019-10-06 DIAGNOSIS — Z992 Dependence on renal dialysis: Secondary | ICD-10-CM | POA: Diagnosis not present

## 2019-10-06 DIAGNOSIS — N186 End stage renal disease: Secondary | ICD-10-CM | POA: Diagnosis not present

## 2019-10-06 DIAGNOSIS — D631 Anemia in chronic kidney disease: Secondary | ICD-10-CM | POA: Diagnosis not present

## 2019-10-06 DIAGNOSIS — N2581 Secondary hyperparathyroidism of renal origin: Secondary | ICD-10-CM | POA: Diagnosis not present

## 2019-10-06 DIAGNOSIS — D509 Iron deficiency anemia, unspecified: Secondary | ICD-10-CM | POA: Diagnosis not present

## 2019-10-08 DIAGNOSIS — N186 End stage renal disease: Secondary | ICD-10-CM | POA: Diagnosis not present

## 2019-10-08 DIAGNOSIS — D509 Iron deficiency anemia, unspecified: Secondary | ICD-10-CM | POA: Diagnosis not present

## 2019-10-08 DIAGNOSIS — Z992 Dependence on renal dialysis: Secondary | ICD-10-CM | POA: Diagnosis not present

## 2019-10-08 DIAGNOSIS — D631 Anemia in chronic kidney disease: Secondary | ICD-10-CM | POA: Diagnosis not present

## 2019-10-08 DIAGNOSIS — N2581 Secondary hyperparathyroidism of renal origin: Secondary | ICD-10-CM | POA: Diagnosis not present

## 2019-10-10 DIAGNOSIS — D509 Iron deficiency anemia, unspecified: Secondary | ICD-10-CM | POA: Diagnosis not present

## 2019-10-10 DIAGNOSIS — D631 Anemia in chronic kidney disease: Secondary | ICD-10-CM | POA: Diagnosis not present

## 2019-10-10 DIAGNOSIS — N186 End stage renal disease: Secondary | ICD-10-CM | POA: Diagnosis not present

## 2019-10-10 DIAGNOSIS — Z992 Dependence on renal dialysis: Secondary | ICD-10-CM | POA: Diagnosis not present

## 2019-10-10 DIAGNOSIS — N2581 Secondary hyperparathyroidism of renal origin: Secondary | ICD-10-CM | POA: Diagnosis not present

## 2019-10-13 DIAGNOSIS — Z992 Dependence on renal dialysis: Secondary | ICD-10-CM | POA: Diagnosis not present

## 2019-10-13 DIAGNOSIS — N186 End stage renal disease: Secondary | ICD-10-CM | POA: Diagnosis not present

## 2019-10-13 DIAGNOSIS — N2581 Secondary hyperparathyroidism of renal origin: Secondary | ICD-10-CM | POA: Diagnosis not present

## 2019-10-13 DIAGNOSIS — D631 Anemia in chronic kidney disease: Secondary | ICD-10-CM | POA: Diagnosis not present

## 2019-10-13 DIAGNOSIS — D509 Iron deficiency anemia, unspecified: Secondary | ICD-10-CM | POA: Diagnosis not present

## 2019-10-15 DIAGNOSIS — N186 End stage renal disease: Secondary | ICD-10-CM | POA: Diagnosis not present

## 2019-10-15 DIAGNOSIS — Z992 Dependence on renal dialysis: Secondary | ICD-10-CM | POA: Diagnosis not present

## 2019-10-15 DIAGNOSIS — D509 Iron deficiency anemia, unspecified: Secondary | ICD-10-CM | POA: Diagnosis not present

## 2019-10-15 DIAGNOSIS — D631 Anemia in chronic kidney disease: Secondary | ICD-10-CM | POA: Diagnosis not present

## 2019-10-15 DIAGNOSIS — N2581 Secondary hyperparathyroidism of renal origin: Secondary | ICD-10-CM | POA: Diagnosis not present

## 2019-10-17 DIAGNOSIS — Z992 Dependence on renal dialysis: Secondary | ICD-10-CM | POA: Diagnosis not present

## 2019-10-17 DIAGNOSIS — D631 Anemia in chronic kidney disease: Secondary | ICD-10-CM | POA: Diagnosis not present

## 2019-10-17 DIAGNOSIS — N2581 Secondary hyperparathyroidism of renal origin: Secondary | ICD-10-CM | POA: Diagnosis not present

## 2019-10-17 DIAGNOSIS — D509 Iron deficiency anemia, unspecified: Secondary | ICD-10-CM | POA: Diagnosis not present

## 2019-10-17 DIAGNOSIS — N186 End stage renal disease: Secondary | ICD-10-CM | POA: Diagnosis not present

## 2019-10-19 DIAGNOSIS — Z299 Encounter for prophylactic measures, unspecified: Secondary | ICD-10-CM | POA: Diagnosis not present

## 2019-10-19 DIAGNOSIS — N186 End stage renal disease: Secondary | ICD-10-CM | POA: Diagnosis not present

## 2019-10-19 DIAGNOSIS — Z992 Dependence on renal dialysis: Secondary | ICD-10-CM | POA: Diagnosis not present

## 2019-10-19 DIAGNOSIS — I10 Essential (primary) hypertension: Secondary | ICD-10-CM | POA: Diagnosis not present

## 2019-10-19 DIAGNOSIS — I429 Cardiomyopathy, unspecified: Secondary | ICD-10-CM | POA: Diagnosis not present

## 2019-10-19 DIAGNOSIS — Z6823 Body mass index (BMI) 23.0-23.9, adult: Secondary | ICD-10-CM | POA: Diagnosis not present

## 2019-11-21 DIAGNOSIS — N186 End stage renal disease: Secondary | ICD-10-CM | POA: Diagnosis not present

## 2019-11-21 DIAGNOSIS — Z992 Dependence on renal dialysis: Secondary | ICD-10-CM | POA: Diagnosis not present

## 2019-11-24 DIAGNOSIS — I270 Primary pulmonary hypertension: Secondary | ICD-10-CM | POA: Diagnosis not present

## 2019-11-24 DIAGNOSIS — Z6823 Body mass index (BMI) 23.0-23.9, adult: Secondary | ICD-10-CM | POA: Diagnosis not present

## 2019-11-24 DIAGNOSIS — K529 Noninfective gastroenteritis and colitis, unspecified: Secondary | ICD-10-CM | POA: Diagnosis not present

## 2019-11-24 DIAGNOSIS — I429 Cardiomyopathy, unspecified: Secondary | ICD-10-CM | POA: Diagnosis not present

## 2019-11-24 DIAGNOSIS — Z992 Dependence on renal dialysis: Secondary | ICD-10-CM | POA: Diagnosis not present

## 2019-11-24 DIAGNOSIS — N186 End stage renal disease: Secondary | ICD-10-CM | POA: Diagnosis not present

## 2019-11-24 DIAGNOSIS — Z299 Encounter for prophylactic measures, unspecified: Secondary | ICD-10-CM | POA: Diagnosis not present

## 2019-11-24 DIAGNOSIS — I10 Essential (primary) hypertension: Secondary | ICD-10-CM | POA: Diagnosis not present

## 2019-11-26 DIAGNOSIS — Z992 Dependence on renal dialysis: Secondary | ICD-10-CM | POA: Diagnosis not present

## 2019-11-26 DIAGNOSIS — N186 End stage renal disease: Secondary | ICD-10-CM | POA: Diagnosis not present

## 2019-11-28 DIAGNOSIS — Z992 Dependence on renal dialysis: Secondary | ICD-10-CM | POA: Diagnosis not present

## 2019-11-28 DIAGNOSIS — N186 End stage renal disease: Secondary | ICD-10-CM | POA: Diagnosis not present

## 2019-12-01 DIAGNOSIS — Z4931 Encounter for adequacy testing for hemodialysis: Secondary | ICD-10-CM | POA: Diagnosis not present

## 2019-12-01 DIAGNOSIS — Z992 Dependence on renal dialysis: Secondary | ICD-10-CM | POA: Diagnosis not present

## 2019-12-01 DIAGNOSIS — N186 End stage renal disease: Secondary | ICD-10-CM | POA: Diagnosis not present

## 2019-12-01 DIAGNOSIS — N2581 Secondary hyperparathyroidism of renal origin: Secondary | ICD-10-CM | POA: Diagnosis not present

## 2019-12-03 DIAGNOSIS — N186 End stage renal disease: Secondary | ICD-10-CM | POA: Diagnosis not present

## 2019-12-03 DIAGNOSIS — Z992 Dependence on renal dialysis: Secondary | ICD-10-CM | POA: Diagnosis not present

## 2019-12-05 DIAGNOSIS — N186 End stage renal disease: Secondary | ICD-10-CM | POA: Diagnosis not present

## 2019-12-05 DIAGNOSIS — Z992 Dependence on renal dialysis: Secondary | ICD-10-CM | POA: Diagnosis not present

## 2019-12-07 DIAGNOSIS — Z7982 Long term (current) use of aspirin: Secondary | ICD-10-CM | POA: Diagnosis not present

## 2019-12-07 DIAGNOSIS — Z8249 Family history of ischemic heart disease and other diseases of the circulatory system: Secondary | ICD-10-CM | POA: Diagnosis not present

## 2019-12-07 DIAGNOSIS — I214 Non-ST elevation (NSTEMI) myocardial infarction: Secondary | ICD-10-CM | POA: Diagnosis not present

## 2019-12-07 DIAGNOSIS — N186 End stage renal disease: Secondary | ICD-10-CM | POA: Diagnosis not present

## 2019-12-07 DIAGNOSIS — R197 Diarrhea, unspecified: Secondary | ICD-10-CM | POA: Diagnosis not present

## 2019-12-07 DIAGNOSIS — Z79899 Other long term (current) drug therapy: Secondary | ICD-10-CM | POA: Diagnosis not present

## 2019-12-07 DIAGNOSIS — Z992 Dependence on renal dialysis: Secondary | ICD-10-CM | POA: Diagnosis not present

## 2019-12-07 DIAGNOSIS — I120 Hypertensive chronic kidney disease with stage 5 chronic kidney disease or end stage renal disease: Secondary | ICD-10-CM | POA: Diagnosis not present

## 2019-12-07 DIAGNOSIS — R079 Chest pain, unspecified: Secondary | ICD-10-CM | POA: Diagnosis not present

## 2019-12-07 DIAGNOSIS — Z20822 Contact with and (suspected) exposure to covid-19: Secondary | ICD-10-CM | POA: Diagnosis not present

## 2019-12-07 DIAGNOSIS — I872 Venous insufficiency (chronic) (peripheral): Secondary | ICD-10-CM | POA: Diagnosis not present

## 2019-12-08 DIAGNOSIS — M79603 Pain in arm, unspecified: Secondary | ICD-10-CM | POA: Diagnosis not present

## 2019-12-08 DIAGNOSIS — Z79899 Other long term (current) drug therapy: Secondary | ICD-10-CM | POA: Diagnosis not present

## 2019-12-08 DIAGNOSIS — N86 Erosion and ectropion of cervix uteri: Secondary | ICD-10-CM | POA: Diagnosis not present

## 2019-12-08 DIAGNOSIS — Z992 Dependence on renal dialysis: Secondary | ICD-10-CM | POA: Diagnosis not present

## 2019-12-08 DIAGNOSIS — I491 Atrial premature depolarization: Secondary | ICD-10-CM | POA: Diagnosis not present

## 2019-12-08 DIAGNOSIS — Z940 Kidney transplant status: Secondary | ICD-10-CM | POA: Diagnosis not present

## 2019-12-08 DIAGNOSIS — N186 End stage renal disease: Secondary | ICD-10-CM | POA: Diagnosis not present

## 2019-12-08 DIAGNOSIS — I509 Heart failure, unspecified: Secondary | ICD-10-CM | POA: Diagnosis not present

## 2019-12-08 DIAGNOSIS — R079 Chest pain, unspecified: Secondary | ICD-10-CM | POA: Diagnosis not present

## 2019-12-08 DIAGNOSIS — E785 Hyperlipidemia, unspecified: Secondary | ICD-10-CM | POA: Diagnosis not present

## 2019-12-08 DIAGNOSIS — D631 Anemia in chronic kidney disease: Secondary | ICD-10-CM | POA: Diagnosis not present

## 2019-12-08 DIAGNOSIS — K759 Inflammatory liver disease, unspecified: Secondary | ICD-10-CM | POA: Diagnosis not present

## 2019-12-08 DIAGNOSIS — I132 Hypertensive heart and chronic kidney disease with heart failure and with stage 5 chronic kidney disease, or end stage renal disease: Secondary | ICD-10-CM | POA: Diagnosis not present

## 2019-12-08 DIAGNOSIS — I120 Hypertensive chronic kidney disease with stage 5 chronic kidney disease or end stage renal disease: Secondary | ICD-10-CM | POA: Diagnosis not present

## 2019-12-09 DIAGNOSIS — E7800 Pure hypercholesterolemia, unspecified: Secondary | ICD-10-CM | POA: Diagnosis not present

## 2019-12-09 DIAGNOSIS — I10 Essential (primary) hypertension: Secondary | ICD-10-CM | POA: Diagnosis not present

## 2019-12-10 DIAGNOSIS — N186 End stage renal disease: Secondary | ICD-10-CM | POA: Diagnosis not present

## 2019-12-10 DIAGNOSIS — Z992 Dependence on renal dialysis: Secondary | ICD-10-CM | POA: Diagnosis not present

## 2019-12-12 DIAGNOSIS — Z992 Dependence on renal dialysis: Secondary | ICD-10-CM | POA: Diagnosis not present

## 2019-12-12 DIAGNOSIS — N186 End stage renal disease: Secondary | ICD-10-CM | POA: Diagnosis not present

## 2019-12-15 DIAGNOSIS — D509 Iron deficiency anemia, unspecified: Secondary | ICD-10-CM | POA: Diagnosis not present

## 2019-12-15 DIAGNOSIS — N186 End stage renal disease: Secondary | ICD-10-CM | POA: Diagnosis not present

## 2019-12-15 DIAGNOSIS — Z992 Dependence on renal dialysis: Secondary | ICD-10-CM | POA: Diagnosis not present

## 2019-12-15 DIAGNOSIS — N2581 Secondary hyperparathyroidism of renal origin: Secondary | ICD-10-CM | POA: Diagnosis not present

## 2019-12-16 DIAGNOSIS — Z6823 Body mass index (BMI) 23.0-23.9, adult: Secondary | ICD-10-CM | POA: Diagnosis not present

## 2019-12-16 DIAGNOSIS — I429 Cardiomyopathy, unspecified: Secondary | ICD-10-CM | POA: Diagnosis not present

## 2019-12-16 DIAGNOSIS — N186 End stage renal disease: Secondary | ICD-10-CM | POA: Diagnosis not present

## 2019-12-16 DIAGNOSIS — I10 Essential (primary) hypertension: Secondary | ICD-10-CM | POA: Diagnosis not present

## 2019-12-16 DIAGNOSIS — Z299 Encounter for prophylactic measures, unspecified: Secondary | ICD-10-CM | POA: Diagnosis not present

## 2019-12-16 DIAGNOSIS — M169 Osteoarthritis of hip, unspecified: Secondary | ICD-10-CM | POA: Diagnosis not present

## 2019-12-17 DIAGNOSIS — N186 End stage renal disease: Secondary | ICD-10-CM | POA: Diagnosis not present

## 2019-12-17 DIAGNOSIS — Z992 Dependence on renal dialysis: Secondary | ICD-10-CM | POA: Diagnosis not present

## 2019-12-19 DIAGNOSIS — Z992 Dependence on renal dialysis: Secondary | ICD-10-CM | POA: Diagnosis not present

## 2019-12-19 DIAGNOSIS — N186 End stage renal disease: Secondary | ICD-10-CM | POA: Diagnosis not present

## 2019-12-20 DIAGNOSIS — N186 End stage renal disease: Secondary | ICD-10-CM | POA: Diagnosis not present

## 2019-12-20 DIAGNOSIS — Z992 Dependence on renal dialysis: Secondary | ICD-10-CM | POA: Diagnosis not present

## 2019-12-22 DIAGNOSIS — N186 End stage renal disease: Secondary | ICD-10-CM | POA: Diagnosis not present

## 2019-12-22 DIAGNOSIS — Z992 Dependence on renal dialysis: Secondary | ICD-10-CM | POA: Diagnosis not present

## 2019-12-23 ENCOUNTER — Encounter (INDEPENDENT_AMBULATORY_CARE_PROVIDER_SITE_OTHER): Payer: Self-pay | Admitting: Nurse Practitioner

## 2019-12-23 ENCOUNTER — Ambulatory Visit (INDEPENDENT_AMBULATORY_CARE_PROVIDER_SITE_OTHER): Payer: Medicare HMO

## 2019-12-23 ENCOUNTER — Ambulatory Visit (INDEPENDENT_AMBULATORY_CARE_PROVIDER_SITE_OTHER): Payer: Medicare HMO | Admitting: Nurse Practitioner

## 2019-12-23 ENCOUNTER — Other Ambulatory Visit: Payer: Self-pay

## 2019-12-23 ENCOUNTER — Encounter (INDEPENDENT_AMBULATORY_CARE_PROVIDER_SITE_OTHER): Payer: Self-pay

## 2019-12-23 VITALS — BP 131/72 | HR 77 | Resp 18 | Ht 64.0 in | Wt 137.0 lb

## 2019-12-23 DIAGNOSIS — N186 End stage renal disease: Secondary | ICD-10-CM

## 2019-12-23 DIAGNOSIS — M1990 Unspecified osteoarthritis, unspecified site: Secondary | ICD-10-CM | POA: Insufficient documentation

## 2019-12-23 DIAGNOSIS — E782 Mixed hyperlipidemia: Secondary | ICD-10-CM | POA: Diagnosis not present

## 2019-12-23 DIAGNOSIS — Z992 Dependence on renal dialysis: Secondary | ICD-10-CM | POA: Insufficient documentation

## 2019-12-23 DIAGNOSIS — I10 Essential (primary) hypertension: Secondary | ICD-10-CM | POA: Diagnosis not present

## 2019-12-23 DIAGNOSIS — Z95820 Peripheral vascular angioplasty status with implants and grafts: Secondary | ICD-10-CM

## 2019-12-24 DIAGNOSIS — Z992 Dependence on renal dialysis: Secondary | ICD-10-CM | POA: Diagnosis not present

## 2019-12-24 DIAGNOSIS — N186 End stage renal disease: Secondary | ICD-10-CM | POA: Diagnosis not present

## 2019-12-26 DIAGNOSIS — N186 End stage renal disease: Secondary | ICD-10-CM | POA: Diagnosis not present

## 2019-12-26 DIAGNOSIS — Z992 Dependence on renal dialysis: Secondary | ICD-10-CM | POA: Diagnosis not present

## 2019-12-28 ENCOUNTER — Encounter (INDEPENDENT_AMBULATORY_CARE_PROVIDER_SITE_OTHER): Payer: Self-pay | Admitting: Nurse Practitioner

## 2019-12-28 NOTE — Progress Notes (Signed)
SUBJECTIVE:  Patient ID: [name], female    DOB: Feb 06, 1951, 69 y.o.   MRN: [id number] Chief Complaint  Patient presents with  . Follow-up    HPI  [name] is a 69 y.o. female The patient returns to the office for followup of their dialysis access.  The patient had a fistulogram on 08/10/2019 however the patient has not followed up in office after that procedure.  The patient states function of the access has been stable. The patient denies increased bleeding time or increased recirculation. Patient denies difficulty with cannulation. The patient denies hand pain or other symptoms consistent with steal phenomena.  No significant leg swelling  The patient denies redness or swelling at the access site. The patient denies fever or chills at home or while on dialysis.  The patient denies amaurosis fugax or recent TIA symptoms. There are no recent neurological changes noted. The patient denies claudication symptoms or rest pain symptoms. The patient denies history of DVT, PE or superficial thrombophlebitis. The patient denies recent episodes of angina or shortness of breath.   Today the patient's noninvasive studies show a flow volume of 904.  There is elevated velocities near the arterial anastomosis.  There is a pseudoaneurysmal area at the right upper thigh mid segment measuring 1.42 cm x 2.56 cm and the mid to distal segment measuring 1.95 x 2.51 the AV graft appears to be patent throughout.     Past Medical History:  Diagnosis Date  . Chronic systolic heart failure ([place])   . Coronary atherosclerosis of native coronary artery   . Diseases of tricuspid valve   . End stage renal disease ([place])    dialysis T,Th & Sat  . Mitral valve insufficiency and aortic valve insufficiency   . Other and unspecified hyperlipidemia   . Secondary cardiomyopathy, unspecified   . Unspecified essential hypertension     Past Surgical History:  Procedure Laterality Date  . A/V SHUNTOGRAM N/A  08/10/2019   Procedure: A/V SHUNTOGRAM;  Surgeon: Algernon Huxley, MD;  Location: [facility];  Service: Cardiovascular;  Laterality: N/A;  . AV FISTULA PLACEMENT Right 2012  . AV FISTULA PLACEMENT Left 2004  . CATARACT EXTRACTION W/PHACO Left 08/07/2019   Procedure: CATARACT EXTRACTION PHACO AND INTRAOCULAR LENS PLACEMENT LEFT EYE;  Surgeon: [name], MD;  Location: [facility];  Service: Ophthalmology;  Laterality: Left;  left  . CATARACT EXTRACTION W/PHACO Right 08/21/2019   Procedure: CATARACT EXTRACTION PHACO AND INTRAOCULAR LENS PLACEMENT RIGHT EYE (CDE: 5.45);  Surgeon: [name], MD;  Location: [facility];  Service: Ophthalmology;  Laterality: Right;  . INSERTION OF DIALYSIS CATHETER N/A 01/08/2013   Procedure: INSERTION OF DIALYSIS CATHETER;  Surgeon: Angelia Mould, MD;  Location: [facility];  Service: Vascular;  Laterality: N/A;  . KIDNEY TRANSPLANT Left 2002   pt. states transplant lasted 2 yrs.  Marland Kitchen PERIPHERAL VASCULAR CATHETERIZATION Right 08/27/2016   Procedure: A/V Shuntogram/Fistulagram;  Surgeon: Algernon Huxley, MD;  Location: [facility];  Service: Cardiovascular;  Laterality: Right;    Social History   Socioeconomic History  . Marital status: Single    Spouse name: Not on file  . Number of children: Not on file  . Years of education: Not on file  . Highest education level: Not on file  Occupational History  . Not on file  Tobacco Use  . Smoking status: Never Smoker  . Smokeless tobacco: Never Used  Substance and Sexual Activity  . Alcohol  use: No  . Drug use: No  . Sexual activity: Not on file  Other Topics Concern  . Not on file  Social History Narrative  . Not on file   Social Determinants of Health   Financial Resource Strain:   . Difficulty of Paying Living Expenses: Not on file  Food Insecurity:   . Worried About Charity fundraiser in the Last Year: Not on file  . Ran Out of Food in the Last Year: Not on file  Transportation Needs:   .  Lack of Transportation (Medical): Not on file  . Lack of Transportation (Non-Medical): Not on file  Physical Activity:   . Days of Exercise per Week: Not on file  . Minutes of Exercise per Session: Not on file  Stress:   . Feeling of Stress : Not on file  Social Connections:   . Frequency of Communication with Friends and Family: Not on file  . Frequency of Social Gatherings with Friends and Family: Not on file  . Attends Religious Services: Not on file  . Active Member of Clubs or Organizations: Not on file  . Attends Archivist Meetings: Not on file  . Marital Status: Not on file  Intimate Partner Violence:   . Fear of Current or Ex-Partner: Not on file  . Emotionally Abused: Not on file  . Physically Abused: Not on file  . Sexually Abused: Not on file    History reviewed. No pertinent family history.  Allergies  Allergen Reactions  . Dilaudid [Hydromorphone] Nausea And Vomiting  . Dilaudid  [Hydromorphone Hcl]   . Tape Rash    Plastic tape     Review of Systems   Review of Systems: Negative Unless Checked Constitutional: [] Weight loss  [] Fever  [] Chills Cardiac: [] Chest pain   []  Atrial Fibrillation  [] Palpitations   [] Shortness of breath when laying flat   [] Shortness of breath with exertion. [] Shortness of breath at rest Vascular:  [] Pain in legs with walking   [] Pain in legs with standing [] Pain in legs when laying flat   [] Claudication    [] Pain in feet when laying flat    [] History of DVT   [] Phlebitis   [] Swelling in legs   [] Varicose veins   [] Non-healing ulcers Pulmonary:   [] Uses home oxygen   [] Productive cough   [] Hemoptysis   [] Wheeze  [] COPD   [] Asthma Neurologic:  [] Dizziness   [] Seizures  [] Blackouts [] History of stroke   [] History of TIA  [] Aphasia   [] Temporary Blindness   [] Weakness or numbness in arm   [] Weakness or numbness in leg Musculoskeletal:   [] Joint swelling   [] Joint pain   [] Low back pain  []  History of Knee Replacement [x] Arthritis  [] back Surgeries  []  Spinal Stenosis    Hematologic:  [] Easy bruising  [] Easy bleeding   [] Hypercoagulable state   [x] Anemic Gastrointestinal:  [] Diarrhea   [] Vomiting  [] Gastroesophageal reflux/heartburn   [] Difficulty swallowing. [] Abdominal pain Genitourinary:  [x] Chronic kidney disease   [] Difficult urination  [] Anuric   [] Blood in urine [] Frequent urination  [] Burning with urination   [] Hematuria Skin:  [] Rashes   [] Ulcers [] Wounds Psychological:  [] History of anxiety   []  History of major depression  []  Memory Difficulties      OBJECTIVE:   Physical Exam  BP 131/72 (BP Location: Left Arm)   Pulse 77   Resp 18   Ht 5\' 4"  (1.626 m)   Wt 137 lb (62.1 kg)   BMI 23.52 kg/m  Gen: WD/WN, NAD Head: [place]/AT, No temporalis wasting.  Ear/Nose/Throat: Hearing grossly intact, nares w/o erythema or drainage Eyes: PER, EOMI, sclera nonicteric.  Neck: Supple, no masses.  No JVD.  Pulmonary:  Good air movement, no use of accessory muscles.  Cardiac: RRR Vascular: .  Thrill and bruit Vessel Right Left  Radial Palpable Palpable  Posterior Tibial Palpable Palpable   Gastrointestinal: soft, non-distended. No guarding/no peritoneal signs.  Musculoskeletal: M/S 5/5 throughout.  No deformity or atrophy.  Neurologic: Pain and light touch intact in extremities.  Symmetrical.  Speech is fluent. Motor exam as listed above. Psychiatric: Judgment intact, Mood & affect appropriate for pt's clinical situation.       ASSESSMENT AND PLAN:  1. End stage renal disease ([place]) Recommend:  The patient is doing well and currently has adequate dialysis access. The patient's dialysis center is not reporting any major access issues.  However, the flow rates in the patient's dialysis access are low, in the prethrombotic range, and the patient states that there have been no issues with running during dialysis..  This raises concerns that the access is at moderate but not high risk for a problem or thrombosis  and should be followed more closely  The patient will follow-up with me in the office in 3 months without an HDA   2. Essential hypertension Continue antihypertensive medications as already ordered, these medications have been reviewed and there are no changes at this time.   3. Mixed hyperlipidemia Continue statin as ordered and reviewed, no changes at this time    Current Outpatient Medications on File Prior to Visit  Medication Sig Dispense Refill  . acetaminophen (TYLENOL) 500 MG tablet Take 1,000 mg by mouth every 6 (six) hours as needed for pain (headache).    Marland Kitchen aspirin 81 MG tablet Take 81 mg by mouth daily.    Marland Kitchen atorvastatin (LIPITOR) 20 MG tablet Take by mouth.    . B Complex-C-Folic Acid (RENAL) 1 MG CAPS Take by mouth.    . B Complex-C-Zn-Folic Acid (NEPHPLEX RX PO) Take 1 tablet by mouth daily.    . cinacalcet (SENSIPAR) 30 MG tablet Take 30 mg by mouth daily after supper.    . docusate sodium (COLACE) 100 MG capsule Take by mouth.    . gabapentin (NEURONTIN) 300 MG capsule     . isosorbide mononitrate (IMDUR) 30 MG 24 hr tablet Take 30 mg by mouth daily.    Marland Kitchen labetalol (NORMODYNE) 300 MG tablet Take 300 mg by mouth 2 (two) times daily.    . minoxidil (LONITEN) 2.5 MG tablet Take 2.5 mg by mouth 2 (two) times daily.    Marland Kitchen omeprazole (PRILOSEC) 40 MG capsule     . sevelamer (RENAGEL) 800 MG tablet Take 800-1,600 mg by mouth See admin instructions. Take 2 tablets by mouth with meals and 1 with snacks.    . sevelamer carbonate (RENVELA) 800 MG tablet Take by mouth.    . simvastatin (ZOCOR) 20 MG tablet Take 20 mg by mouth daily.    . traMADol (ULTRAM) 50 MG tablet Take by mouth.    Marland Kitchen HYDROcodone-acetaminophen (NORCO/VICODIN) 5-325 MG tablet      No current facility-administered medications on file prior to visit.    There are no Patient Instructions on file for this visit. No follow-ups on file.   Kris Hartmann, NP  This note was completed with Sales executive.   Any errors are purely unintentional.

## 2019-12-29 DIAGNOSIS — Z992 Dependence on renal dialysis: Secondary | ICD-10-CM | POA: Diagnosis not present

## 2019-12-29 DIAGNOSIS — N186 End stage renal disease: Secondary | ICD-10-CM | POA: Diagnosis not present

## 2019-12-29 DIAGNOSIS — N2581 Secondary hyperparathyroidism of renal origin: Secondary | ICD-10-CM | POA: Diagnosis not present

## 2019-12-31 ENCOUNTER — Other Ambulatory Visit: Payer: Self-pay

## 2019-12-31 ENCOUNTER — Ambulatory Visit: Payer: PRIVATE HEALTH INSURANCE | Attending: Internal Medicine

## 2019-12-31 DIAGNOSIS — N186 End stage renal disease: Secondary | ICD-10-CM | POA: Diagnosis not present

## 2019-12-31 DIAGNOSIS — Z23 Encounter for immunization: Secondary | ICD-10-CM | POA: Insufficient documentation

## 2019-12-31 DIAGNOSIS — Z992 Dependence on renal dialysis: Secondary | ICD-10-CM | POA: Diagnosis not present

## 2019-12-31 NOTE — Progress Notes (Signed)
   Covid-19 Vaccination Clinic  Name:  [name]    MRN: [id number] DOB: 05/26/1951  12/31/2019  [name] was observed post Covid-19 immunization for 15 minutes without incidence. She was provided with Vaccine Information Sheet and instruction to access the V-Safe system.   [name] was instructed to call 911 with any severe reactions post vaccine: Marland Kitchen Difficulty breathing  . Swelling of your face and throat  . A fast heartbeat  . A bad rash all over your body  . Dizziness and weakness    Immunizations Administered    Name Date Dose VIS Date Route   Moderna COVID-19 Vaccine 12/31/2019  1:01 PM 0.5 mL 10/20/2019 Intramuscular   Manufacturer: Moderna   Lot: [id number]   [place]: [id number]

## 2020-01-01 DIAGNOSIS — Z299 Encounter for prophylactic measures, unspecified: Secondary | ICD-10-CM | POA: Diagnosis not present

## 2020-01-01 DIAGNOSIS — R197 Diarrhea, unspecified: Secondary | ICD-10-CM | POA: Diagnosis not present

## 2020-01-01 DIAGNOSIS — I270 Primary pulmonary hypertension: Secondary | ICD-10-CM | POA: Diagnosis not present

## 2020-01-01 DIAGNOSIS — Z789 Other specified health status: Secondary | ICD-10-CM | POA: Diagnosis not present

## 2020-01-02 DIAGNOSIS — Z992 Dependence on renal dialysis: Secondary | ICD-10-CM | POA: Diagnosis not present

## 2020-01-02 DIAGNOSIS — N186 End stage renal disease: Secondary | ICD-10-CM | POA: Diagnosis not present

## 2020-01-05 DIAGNOSIS — Z992 Dependence on renal dialysis: Secondary | ICD-10-CM | POA: Diagnosis not present

## 2020-01-05 DIAGNOSIS — N186 End stage renal disease: Secondary | ICD-10-CM | POA: Diagnosis not present

## 2020-01-07 DIAGNOSIS — Z992 Dependence on renal dialysis: Secondary | ICD-10-CM | POA: Diagnosis not present

## 2020-01-07 DIAGNOSIS — N186 End stage renal disease: Secondary | ICD-10-CM | POA: Diagnosis not present

## 2020-01-08 DIAGNOSIS — E7800 Pure hypercholesterolemia, unspecified: Secondary | ICD-10-CM | POA: Diagnosis not present

## 2020-01-08 DIAGNOSIS — I10 Essential (primary) hypertension: Secondary | ICD-10-CM | POA: Diagnosis not present

## 2020-01-09 DIAGNOSIS — Z992 Dependence on renal dialysis: Secondary | ICD-10-CM | POA: Diagnosis not present

## 2020-01-09 DIAGNOSIS — N186 End stage renal disease: Secondary | ICD-10-CM | POA: Diagnosis not present

## 2020-01-12 DIAGNOSIS — Z992 Dependence on renal dialysis: Secondary | ICD-10-CM | POA: Diagnosis not present

## 2020-01-12 DIAGNOSIS — D509 Iron deficiency anemia, unspecified: Secondary | ICD-10-CM | POA: Diagnosis not present

## 2020-01-12 DIAGNOSIS — N2581 Secondary hyperparathyroidism of renal origin: Secondary | ICD-10-CM | POA: Diagnosis not present

## 2020-01-12 DIAGNOSIS — N186 End stage renal disease: Secondary | ICD-10-CM | POA: Diagnosis not present

## 2020-01-14 DIAGNOSIS — N186 End stage renal disease: Secondary | ICD-10-CM | POA: Diagnosis not present

## 2020-01-14 DIAGNOSIS — Z992 Dependence on renal dialysis: Secondary | ICD-10-CM | POA: Diagnosis not present

## 2020-01-16 DIAGNOSIS — Z992 Dependence on renal dialysis: Secondary | ICD-10-CM | POA: Diagnosis not present

## 2020-01-16 DIAGNOSIS — N186 End stage renal disease: Secondary | ICD-10-CM | POA: Diagnosis not present

## 2020-01-17 DIAGNOSIS — N186 End stage renal disease: Secondary | ICD-10-CM | POA: Diagnosis not present

## 2020-01-17 DIAGNOSIS — Z992 Dependence on renal dialysis: Secondary | ICD-10-CM | POA: Diagnosis not present

## 2020-01-19 DIAGNOSIS — Z992 Dependence on renal dialysis: Secondary | ICD-10-CM | POA: Diagnosis not present

## 2020-01-19 DIAGNOSIS — N186 End stage renal disease: Secondary | ICD-10-CM | POA: Diagnosis not present

## 2020-01-21 DIAGNOSIS — Z992 Dependence on renal dialysis: Secondary | ICD-10-CM | POA: Diagnosis not present

## 2020-01-21 DIAGNOSIS — N186 End stage renal disease: Secondary | ICD-10-CM | POA: Diagnosis not present

## 2020-01-23 DIAGNOSIS — Z992 Dependence on renal dialysis: Secondary | ICD-10-CM | POA: Diagnosis not present

## 2020-01-23 DIAGNOSIS — N186 End stage renal disease: Secondary | ICD-10-CM | POA: Diagnosis not present

## 2020-01-26 DIAGNOSIS — N186 End stage renal disease: Secondary | ICD-10-CM | POA: Diagnosis not present

## 2020-01-26 DIAGNOSIS — Z992 Dependence on renal dialysis: Secondary | ICD-10-CM | POA: Diagnosis not present

## 2020-01-28 DIAGNOSIS — N186 End stage renal disease: Secondary | ICD-10-CM | POA: Diagnosis not present

## 2020-01-28 DIAGNOSIS — Z992 Dependence on renal dialysis: Secondary | ICD-10-CM | POA: Diagnosis not present

## 2020-01-30 DIAGNOSIS — Z992 Dependence on renal dialysis: Secondary | ICD-10-CM | POA: Diagnosis not present

## 2020-01-30 DIAGNOSIS — N186 End stage renal disease: Secondary | ICD-10-CM | POA: Diagnosis not present

## 2020-02-01 ENCOUNTER — Ambulatory Visit: Payer: PRIVATE HEALTH INSURANCE | Attending: Internal Medicine

## 2020-02-01 DIAGNOSIS — Z23 Encounter for immunization: Secondary | ICD-10-CM

## 2020-02-01 NOTE — Progress Notes (Signed)
   Covid-19 Vaccination Clinic  Name:  [name]    MRN: [id number] DOB: [birth year]  02/01/2020  [name] was observed post Covid-19 immunization for 15 minutes without incident. She was provided with Vaccine Information Sheet and instruction to access the V-Safe system.   [name] was instructed to call 911 with any severe reactions post vaccine: Marland Kitchen Difficulty breathing  . Swelling of face and throat  . A fast heartbeat  . A bad rash all over body  . Dizziness and weakness   Immunizations Administered    Name Date Dose VIS Date Route   Moderna COVID-19 Vaccine 02/01/2020 10:43 AM 0.5 mL 10/20/2019 Intramuscular   Manufacturer: Moderna   Lot: [id number]   [place]: [id number]

## 2020-02-02 DIAGNOSIS — N186 End stage renal disease: Secondary | ICD-10-CM | POA: Diagnosis not present

## 2020-02-02 DIAGNOSIS — Z992 Dependence on renal dialysis: Secondary | ICD-10-CM | POA: Diagnosis not present

## 2020-02-04 DIAGNOSIS — Z992 Dependence on renal dialysis: Secondary | ICD-10-CM | POA: Diagnosis not present

## 2020-02-04 DIAGNOSIS — N186 End stage renal disease: Secondary | ICD-10-CM | POA: Diagnosis not present

## 2020-02-06 DIAGNOSIS — N186 End stage renal disease: Secondary | ICD-10-CM | POA: Diagnosis not present

## 2020-02-06 DIAGNOSIS — Z992 Dependence on renal dialysis: Secondary | ICD-10-CM | POA: Diagnosis not present

## 2020-02-09 DIAGNOSIS — E785 Hyperlipidemia, unspecified: Secondary | ICD-10-CM | POA: Diagnosis not present

## 2020-02-09 DIAGNOSIS — N186 End stage renal disease: Secondary | ICD-10-CM | POA: Diagnosis not present

## 2020-02-09 DIAGNOSIS — Z992 Dependence on renal dialysis: Secondary | ICD-10-CM | POA: Diagnosis not present

## 2020-02-11 DIAGNOSIS — N186 End stage renal disease: Secondary | ICD-10-CM | POA: Diagnosis not present

## 2020-02-11 DIAGNOSIS — Z992 Dependence on renal dialysis: Secondary | ICD-10-CM | POA: Diagnosis not present

## 2020-02-13 DIAGNOSIS — N186 End stage renal disease: Secondary | ICD-10-CM | POA: Diagnosis not present

## 2020-02-13 DIAGNOSIS — Z992 Dependence on renal dialysis: Secondary | ICD-10-CM | POA: Diagnosis not present

## 2020-02-16 DIAGNOSIS — N186 End stage renal disease: Secondary | ICD-10-CM | POA: Diagnosis not present

## 2020-02-16 DIAGNOSIS — Z992 Dependence on renal dialysis: Secondary | ICD-10-CM | POA: Diagnosis not present

## 2020-02-17 DIAGNOSIS — E7800 Pure hypercholesterolemia, unspecified: Secondary | ICD-10-CM | POA: Diagnosis not present

## 2020-02-17 DIAGNOSIS — Z992 Dependence on renal dialysis: Secondary | ICD-10-CM | POA: Diagnosis not present

## 2020-02-17 DIAGNOSIS — I10 Essential (primary) hypertension: Secondary | ICD-10-CM | POA: Diagnosis not present

## 2020-02-17 DIAGNOSIS — N186 End stage renal disease: Secondary | ICD-10-CM | POA: Diagnosis not present

## 2020-02-18 DIAGNOSIS — N186 End stage renal disease: Secondary | ICD-10-CM | POA: Diagnosis not present

## 2020-02-18 DIAGNOSIS — Z992 Dependence on renal dialysis: Secondary | ICD-10-CM | POA: Diagnosis not present

## 2020-02-20 DIAGNOSIS — N186 End stage renal disease: Secondary | ICD-10-CM | POA: Diagnosis not present

## 2020-02-20 DIAGNOSIS — Z992 Dependence on renal dialysis: Secondary | ICD-10-CM | POA: Diagnosis not present

## 2020-02-23 DIAGNOSIS — Z992 Dependence on renal dialysis: Secondary | ICD-10-CM | POA: Diagnosis not present

## 2020-02-23 DIAGNOSIS — N186 End stage renal disease: Secondary | ICD-10-CM | POA: Diagnosis not present

## 2020-02-25 DIAGNOSIS — N186 End stage renal disease: Secondary | ICD-10-CM | POA: Diagnosis not present

## 2020-02-25 DIAGNOSIS — Z992 Dependence on renal dialysis: Secondary | ICD-10-CM | POA: Diagnosis not present

## 2020-02-27 DIAGNOSIS — N186 End stage renal disease: Secondary | ICD-10-CM | POA: Diagnosis not present

## 2020-02-27 DIAGNOSIS — Z992 Dependence on renal dialysis: Secondary | ICD-10-CM | POA: Diagnosis not present

## 2020-03-01 DIAGNOSIS — N186 End stage renal disease: Secondary | ICD-10-CM | POA: Diagnosis not present

## 2020-03-01 DIAGNOSIS — Z992 Dependence on renal dialysis: Secondary | ICD-10-CM | POA: Diagnosis not present

## 2020-03-03 DIAGNOSIS — Z992 Dependence on renal dialysis: Secondary | ICD-10-CM | POA: Diagnosis not present

## 2020-03-03 DIAGNOSIS — N186 End stage renal disease: Secondary | ICD-10-CM | POA: Diagnosis not present

## 2020-03-05 DIAGNOSIS — Z992 Dependence on renal dialysis: Secondary | ICD-10-CM | POA: Diagnosis not present

## 2020-03-05 DIAGNOSIS — N186 End stage renal disease: Secondary | ICD-10-CM | POA: Diagnosis not present

## 2020-03-08 DIAGNOSIS — Z992 Dependence on renal dialysis: Secondary | ICD-10-CM | POA: Diagnosis not present

## 2020-03-08 DIAGNOSIS — N186 End stage renal disease: Secondary | ICD-10-CM | POA: Diagnosis not present

## 2020-03-10 DIAGNOSIS — Z992 Dependence on renal dialysis: Secondary | ICD-10-CM | POA: Diagnosis not present

## 2020-03-10 DIAGNOSIS — N186 End stage renal disease: Secondary | ICD-10-CM | POA: Diagnosis not present

## 2020-03-12 DIAGNOSIS — Z992 Dependence on renal dialysis: Secondary | ICD-10-CM | POA: Diagnosis not present

## 2020-03-12 DIAGNOSIS — N186 End stage renal disease: Secondary | ICD-10-CM | POA: Diagnosis not present

## 2020-03-14 DIAGNOSIS — M25519 Pain in unspecified shoulder: Secondary | ICD-10-CM | POA: Diagnosis not present

## 2020-03-14 DIAGNOSIS — Z299 Encounter for prophylactic measures, unspecified: Secondary | ICD-10-CM | POA: Diagnosis not present

## 2020-03-14 DIAGNOSIS — R197 Diarrhea, unspecified: Secondary | ICD-10-CM | POA: Diagnosis not present

## 2020-03-14 DIAGNOSIS — I10 Essential (primary) hypertension: Secondary | ICD-10-CM | POA: Diagnosis not present

## 2020-03-14 DIAGNOSIS — I270 Primary pulmonary hypertension: Secondary | ICD-10-CM | POA: Diagnosis not present

## 2020-03-15 ENCOUNTER — Other Ambulatory Visit (INDEPENDENT_AMBULATORY_CARE_PROVIDER_SITE_OTHER): Payer: Self-pay | Admitting: Nurse Practitioner

## 2020-03-15 DIAGNOSIS — Z992 Dependence on renal dialysis: Secondary | ICD-10-CM | POA: Diagnosis not present

## 2020-03-15 DIAGNOSIS — N186 End stage renal disease: Secondary | ICD-10-CM | POA: Diagnosis not present

## 2020-03-17 DIAGNOSIS — N186 End stage renal disease: Secondary | ICD-10-CM | POA: Diagnosis not present

## 2020-03-17 DIAGNOSIS — Z992 Dependence on renal dialysis: Secondary | ICD-10-CM | POA: Diagnosis not present

## 2020-03-18 DIAGNOSIS — N186 End stage renal disease: Secondary | ICD-10-CM | POA: Diagnosis not present

## 2020-03-18 DIAGNOSIS — Z992 Dependence on renal dialysis: Secondary | ICD-10-CM | POA: Diagnosis not present

## 2020-03-19 DIAGNOSIS — Z992 Dependence on renal dialysis: Secondary | ICD-10-CM | POA: Diagnosis not present

## 2020-03-19 DIAGNOSIS — N186 End stage renal disease: Secondary | ICD-10-CM | POA: Diagnosis not present

## 2020-03-22 DIAGNOSIS — N186 End stage renal disease: Secondary | ICD-10-CM | POA: Diagnosis not present

## 2020-03-22 DIAGNOSIS — Z992 Dependence on renal dialysis: Secondary | ICD-10-CM | POA: Diagnosis not present

## 2020-03-23 ENCOUNTER — Encounter (INDEPENDENT_AMBULATORY_CARE_PROVIDER_SITE_OTHER): Payer: Self-pay | Admitting: Nurse Practitioner

## 2020-03-23 ENCOUNTER — Ambulatory Visit (INDEPENDENT_AMBULATORY_CARE_PROVIDER_SITE_OTHER): Payer: Medicare HMO | Admitting: Nurse Practitioner

## 2020-03-23 ENCOUNTER — Ambulatory Visit (INDEPENDENT_AMBULATORY_CARE_PROVIDER_SITE_OTHER): Payer: Medicare HMO

## 2020-03-23 ENCOUNTER — Other Ambulatory Visit: Payer: Self-pay

## 2020-03-23 VITALS — BP 159/74 | HR 76 | Ht 64.0 in | Wt 128.0 lb

## 2020-03-23 DIAGNOSIS — I10 Essential (primary) hypertension: Secondary | ICD-10-CM | POA: Diagnosis not present

## 2020-03-23 DIAGNOSIS — E782 Mixed hyperlipidemia: Secondary | ICD-10-CM | POA: Diagnosis not present

## 2020-03-23 DIAGNOSIS — N186 End stage renal disease: Secondary | ICD-10-CM

## 2020-03-23 NOTE — Progress Notes (Signed)
Subjective:    Patient ID: [name], female    DOB: 1950/12/08, 69 y.o.   MRN: [id number] Chief Complaint  Patient presents with  . Follow-up    U/S Follow up    The patient returns to the office for followup of their dialysis access. The function of the access has been stable. The patient denies increased bleeding time or increased recirculation. Patient denies difficulty with cannulation. The patient denies hand pain or other symptoms consistent with steal phenomena.  No significant arm swelling.  The patient denies redness or swelling at the access site. The patient denies fever or chills at home or while on dialysis.  The patient denies amaurosis fugax or recent TIA symptoms. There are no recent neurological changes noted. The patient denies claudication symptoms or rest pain symptoms. The patient denies history of DVT, PE or superficial thrombophlebitis. The patient denies recent episodes of angina or shortness of breath.    The patient's right femorofemoral AV graft has no evidence of significant stenosis.  The patient has a flow volume of 3279.  There are still the same dilated aneurysmal areas seen with maximum dimensions of 2.31 x 2.63 cm.      Review of Systems  Cardiovascular: Positive for leg swelling.  Neurological: Positive for weakness.  All other systems reviewed and are negative.      Objective:   Physical Exam Vitals reviewed.  Cardiovascular:     Rate and Rhythm: Normal rate and regular rhythm.     Pulses:          Dorsalis pedis pulses are 2+ on the right side.       Posterior tibial pulses are 2+ on the right side.     Arteriovenous access: right arteriovenous access is present.    Comments: Right femorofemoral AV graft, aneurysmal, good thrill and bruit Pulmonary:     Effort: Pulmonary effort is normal.     Breath sounds: Normal breath sounds.  Musculoskeletal:     Right lower leg: 2+ Edema present.     Left lower leg: 2+ Edema present.    Neurological:     Mental Status: She is alert and oriented to person, place, and time.  Psychiatric:        Mood and Affect: Mood normal.        Behavior: Behavior normal.        Thought Content: Thought content normal.        Judgment: Judgment normal.     BP (!) 159/74   Pulse 76   Ht 5\' 4"  (1.626 m)   Wt 128 lb (58.1 kg)   BMI 21.97 kg/m   Past Medical History:  Diagnosis Date  . Chronic systolic heart failure ([place])   . Coronary atherosclerosis of native coronary artery   . Diseases of tricuspid valve   . End stage renal disease ([place])    dialysis T,Th & Sat  . Mitral valve insufficiency and aortic valve insufficiency   . Other and unspecified hyperlipidemia   . Secondary cardiomyopathy, unspecified   . Unspecified essential hypertension     Social History   Socioeconomic History  . Marital status: Single    Spouse name: Not on file  . Number of children: Not on file  . Years of education: Not on file  . Highest education level: Not on file  Occupational History  . Not on file  Tobacco Use  . Smoking status: Never Smoker  . Smokeless tobacco: Never Used  Substance and Sexual Activity  . Alcohol use: No  . Drug use: No  . Sexual activity: Not on file  Other Topics Concern  . Not on file  Social History Narrative  . Not on file   Social Determinants of Health   Financial Resource Strain:   . Difficulty of Paying Living Expenses:   Food Insecurity:   . Worried About Charity fundraiser in the Last Year:   . Arboriculturist in the Last Year:   Transportation Needs:   . Film/video editor (Medical):   Marland Kitchen Lack of Transportation (Non-Medical):   Physical Activity:   . Days of Exercise per Week:   . Minutes of Exercise per Session:   Stress:   . Feeling of Stress :   Social Connections:   . Frequency of Communication with Friends and Family:   . Frequency of Social Gatherings with Friends and Family:   . Attends Religious Services:   . Active  Member of Clubs or Organizations:   . Attends Archivist Meetings:   Marland Kitchen Marital Status:   Intimate Partner Violence:   . Fear of Current or Ex-Partner:   . Emotionally Abused:   Marland Kitchen Physically Abused:   . Sexually Abused:     Past Surgical History:  Procedure Laterality Date  . A/V SHUNTOGRAM N/A 08/10/2019   Procedure: A/V SHUNTOGRAM;  Surgeon: Algernon Huxley, MD;  Location: [facility];  Service: Cardiovascular;  Laterality: N/A;  . AV FISTULA PLACEMENT Right 2012  . AV FISTULA PLACEMENT Left 2004  . CATARACT EXTRACTION W/PHACO Left 08/07/2019   Procedure: CATARACT EXTRACTION PHACO AND INTRAOCULAR LENS PLACEMENT LEFT EYE;  Surgeon: [name], MD;  Location: [facility];  Service: Ophthalmology;  Laterality: Left;  left  . CATARACT EXTRACTION W/PHACO Right 08/21/2019   Procedure: CATARACT EXTRACTION PHACO AND INTRAOCULAR LENS PLACEMENT RIGHT EYE (CDE: 5.45);  Surgeon: [name], MD;  Location: [facility];  Service: Ophthalmology;  Laterality: Right;  . INSERTION OF DIALYSIS CATHETER N/A 01/08/2013   Procedure: INSERTION OF DIALYSIS CATHETER;  Surgeon: Angelia Mould, MD;  Location: [facility];  Service: Vascular;  Laterality: N/A;  . KIDNEY TRANSPLANT Left 2002   pt. states transplant lasted 2 yrs.  Marland Kitchen PERIPHERAL VASCULAR CATHETERIZATION Right 08/27/2016   Procedure: A/V Shuntogram/Fistulagram;  Surgeon: Algernon Huxley, MD;  Location: [facility];  Service: Cardiovascular;  Laterality: Right;    No family history on file.  Allergies  Allergen Reactions  . Dilaudid [Hydromorphone] Nausea And Vomiting  . Dilaudid  [Hydromorphone Hcl]   . Tape Rash    Plastic tape       Assessment & Plan:   1. End stage renal disease ([place]) Recommend:  The patient is doing well and currently has adequate dialysis access. The patient's dialysis center is not reporting any access issues. Flow pattern is stable when compared to the prior ultrasound.  The patient should have  a duplex ultrasound of the dialysis access in 6 months. The patient will follow-up with me in the office after each ultrasound     2. Essential hypertension Continue antihypertensive medications as already ordered, these medications have been reviewed and there are no changes at this time.   3. Mixed hyperlipidemia Continue statin as ordered and reviewed, no changes at this time    Current Outpatient Medications on File Prior to Visit  Medication Sig Dispense Refill  . acetaminophen (TYLENOL) 500 MG tablet Take 1,000 mg by  mouth every 6 (six) hours as needed for pain (headache).    Marland Kitchen aspirin 81 MG tablet Take 81 mg by mouth daily.    Marland Kitchen atorvastatin (LIPITOR) 20 MG tablet Take by mouth.    . B Complex-C-Folic Acid (RENAL) 1 MG CAPS Take by mouth.    . B Complex-C-Zn-Folic Acid (NEPHPLEX RX PO) Take 1 tablet by mouth daily.    . cinacalcet (SENSIPAR) 30 MG tablet Take 30 mg by mouth daily after supper.    . docusate sodium (COLACE) 100 MG capsule Take by mouth.    . gabapentin (NEURONTIN) 300 MG capsule     . HYDROcodone-acetaminophen (NORCO/VICODIN) 5-325 MG tablet     . isosorbide mononitrate (IMDUR) 30 MG 24 hr tablet Take 30 mg by mouth daily.    Marland Kitchen labetalol (NORMODYNE) 300 MG tablet Take 300 mg by mouth 2 (two) times daily.    . minoxidil (LONITEN) 2.5 MG tablet Take 2.5 mg by mouth 2 (two) times daily.    Marland Kitchen omeprazole (PRILOSEC) 40 MG capsule     . sevelamer (RENAGEL) 800 MG tablet Take 800-1,600 mg by mouth See admin instructions. Take 2 tablets by mouth with meals and 1 with snacks.    . sevelamer carbonate (RENVELA) 800 MG tablet Take by mouth.    . simvastatin (ZOCOR) 20 MG tablet Take 20 mg by mouth daily.    . traMADol (ULTRAM) 50 MG tablet Take by mouth.     No current facility-administered medications on file prior to visit.    There are no Patient Instructions on file for this visit. No follow-ups on file.   Kris Hartmann, NP

## 2020-03-24 DIAGNOSIS — N186 End stage renal disease: Secondary | ICD-10-CM | POA: Diagnosis not present

## 2020-03-24 DIAGNOSIS — Z992 Dependence on renal dialysis: Secondary | ICD-10-CM | POA: Diagnosis not present

## 2020-03-26 DIAGNOSIS — N186 End stage renal disease: Secondary | ICD-10-CM | POA: Diagnosis not present

## 2020-03-26 DIAGNOSIS — Z992 Dependence on renal dialysis: Secondary | ICD-10-CM | POA: Diagnosis not present

## 2020-03-29 DIAGNOSIS — Z992 Dependence on renal dialysis: Secondary | ICD-10-CM | POA: Diagnosis not present

## 2020-03-29 DIAGNOSIS — N186 End stage renal disease: Secondary | ICD-10-CM | POA: Diagnosis not present

## 2020-03-30 DIAGNOSIS — M25512 Pain in left shoulder: Secondary | ICD-10-CM | POA: Diagnosis not present

## 2020-03-30 DIAGNOSIS — N186 End stage renal disease: Secondary | ICD-10-CM | POA: Diagnosis not present

## 2020-03-30 DIAGNOSIS — Z299 Encounter for prophylactic measures, unspecified: Secondary | ICD-10-CM | POA: Diagnosis not present

## 2020-03-30 DIAGNOSIS — I270 Primary pulmonary hypertension: Secondary | ICD-10-CM | POA: Diagnosis not present

## 2020-03-30 DIAGNOSIS — I10 Essential (primary) hypertension: Secondary | ICD-10-CM | POA: Diagnosis not present

## 2020-03-30 DIAGNOSIS — I429 Cardiomyopathy, unspecified: Secondary | ICD-10-CM | POA: Diagnosis not present

## 2020-03-31 DIAGNOSIS — N186 End stage renal disease: Secondary | ICD-10-CM | POA: Diagnosis not present

## 2020-03-31 DIAGNOSIS — Z992 Dependence on renal dialysis: Secondary | ICD-10-CM | POA: Diagnosis not present

## 2020-04-02 DIAGNOSIS — N186 End stage renal disease: Secondary | ICD-10-CM | POA: Diagnosis not present

## 2020-04-02 DIAGNOSIS — Z992 Dependence on renal dialysis: Secondary | ICD-10-CM | POA: Diagnosis not present

## 2020-04-05 DIAGNOSIS — Z992 Dependence on renal dialysis: Secondary | ICD-10-CM | POA: Diagnosis not present

## 2020-04-05 DIAGNOSIS — N186 End stage renal disease: Secondary | ICD-10-CM | POA: Diagnosis not present

## 2020-04-07 DIAGNOSIS — Z992 Dependence on renal dialysis: Secondary | ICD-10-CM | POA: Diagnosis not present

## 2020-04-07 DIAGNOSIS — N186 End stage renal disease: Secondary | ICD-10-CM | POA: Diagnosis not present

## 2020-04-09 DIAGNOSIS — Z992 Dependence on renal dialysis: Secondary | ICD-10-CM | POA: Diagnosis not present

## 2020-04-09 DIAGNOSIS — N186 End stage renal disease: Secondary | ICD-10-CM | POA: Diagnosis not present

## 2020-04-12 DIAGNOSIS — N186 End stage renal disease: Secondary | ICD-10-CM | POA: Diagnosis not present

## 2020-04-12 DIAGNOSIS — Z992 Dependence on renal dialysis: Secondary | ICD-10-CM | POA: Diagnosis not present

## 2020-04-14 DIAGNOSIS — Z992 Dependence on renal dialysis: Secondary | ICD-10-CM | POA: Diagnosis not present

## 2020-04-14 DIAGNOSIS — N186 End stage renal disease: Secondary | ICD-10-CM | POA: Diagnosis not present

## 2020-04-16 DIAGNOSIS — Z992 Dependence on renal dialysis: Secondary | ICD-10-CM | POA: Diagnosis not present

## 2020-04-16 DIAGNOSIS — N186 End stage renal disease: Secondary | ICD-10-CM | POA: Diagnosis not present

## 2020-04-17 DIAGNOSIS — E7800 Pure hypercholesterolemia, unspecified: Secondary | ICD-10-CM | POA: Diagnosis not present

## 2020-04-17 DIAGNOSIS — I10 Essential (primary) hypertension: Secondary | ICD-10-CM | POA: Diagnosis not present

## 2020-04-18 DIAGNOSIS — N186 End stage renal disease: Secondary | ICD-10-CM | POA: Diagnosis not present

## 2020-04-18 DIAGNOSIS — Z992 Dependence on renal dialysis: Secondary | ICD-10-CM | POA: Diagnosis not present

## 2020-04-19 DIAGNOSIS — Z992 Dependence on renal dialysis: Secondary | ICD-10-CM | POA: Diagnosis not present

## 2020-04-19 DIAGNOSIS — R5383 Other fatigue: Secondary | ICD-10-CM | POA: Diagnosis not present

## 2020-04-19 DIAGNOSIS — Z299 Encounter for prophylactic measures, unspecified: Secondary | ICD-10-CM | POA: Diagnosis not present

## 2020-04-19 DIAGNOSIS — N186 End stage renal disease: Secondary | ICD-10-CM | POA: Diagnosis not present

## 2020-04-19 DIAGNOSIS — R109 Unspecified abdominal pain: Secondary | ICD-10-CM | POA: Diagnosis not present

## 2020-04-21 DIAGNOSIS — N186 End stage renal disease: Secondary | ICD-10-CM | POA: Diagnosis not present

## 2020-04-21 DIAGNOSIS — Z992 Dependence on renal dialysis: Secondary | ICD-10-CM | POA: Diagnosis not present

## 2020-04-23 DIAGNOSIS — Z992 Dependence on renal dialysis: Secondary | ICD-10-CM | POA: Diagnosis not present

## 2020-04-23 DIAGNOSIS — N186 End stage renal disease: Secondary | ICD-10-CM | POA: Diagnosis not present

## 2020-04-26 DIAGNOSIS — N186 End stage renal disease: Secondary | ICD-10-CM | POA: Diagnosis not present

## 2020-04-26 DIAGNOSIS — Z992 Dependence on renal dialysis: Secondary | ICD-10-CM | POA: Diagnosis not present

## 2020-04-28 DIAGNOSIS — N186 End stage renal disease: Secondary | ICD-10-CM | POA: Diagnosis not present

## 2020-04-28 DIAGNOSIS — Z992 Dependence on renal dialysis: Secondary | ICD-10-CM | POA: Diagnosis not present

## 2020-04-30 DIAGNOSIS — Z992 Dependence on renal dialysis: Secondary | ICD-10-CM | POA: Diagnosis not present

## 2020-04-30 DIAGNOSIS — N186 End stage renal disease: Secondary | ICD-10-CM | POA: Diagnosis not present

## 2020-05-03 DIAGNOSIS — N186 End stage renal disease: Secondary | ICD-10-CM | POA: Diagnosis not present

## 2020-05-03 DIAGNOSIS — Z992 Dependence on renal dialysis: Secondary | ICD-10-CM | POA: Diagnosis not present

## 2020-05-05 DIAGNOSIS — N186 End stage renal disease: Secondary | ICD-10-CM | POA: Diagnosis not present

## 2020-05-05 DIAGNOSIS — Z992 Dependence on renal dialysis: Secondary | ICD-10-CM | POA: Diagnosis not present

## 2020-05-07 DIAGNOSIS — Z992 Dependence on renal dialysis: Secondary | ICD-10-CM | POA: Diagnosis not present

## 2020-05-07 DIAGNOSIS — N186 End stage renal disease: Secondary | ICD-10-CM | POA: Diagnosis not present

## 2020-05-09 DIAGNOSIS — K449 Diaphragmatic hernia without obstruction or gangrene: Secondary | ICD-10-CM | POA: Diagnosis not present

## 2020-05-09 DIAGNOSIS — N186 End stage renal disease: Secondary | ICD-10-CM | POA: Diagnosis not present

## 2020-05-09 DIAGNOSIS — Z299 Encounter for prophylactic measures, unspecified: Secondary | ICD-10-CM | POA: Diagnosis not present

## 2020-05-09 DIAGNOSIS — N189 Chronic kidney disease, unspecified: Secondary | ICD-10-CM | POA: Diagnosis not present

## 2020-05-09 DIAGNOSIS — I132 Hypertensive heart and chronic kidney disease with heart failure and with stage 5 chronic kidney disease, or end stage renal disease: Secondary | ICD-10-CM | POA: Diagnosis not present

## 2020-05-09 DIAGNOSIS — Z992 Dependence on renal dialysis: Secondary | ICD-10-CM | POA: Diagnosis not present

## 2020-05-09 DIAGNOSIS — I10 Essential (primary) hypertension: Secondary | ICD-10-CM | POA: Diagnosis not present

## 2020-05-09 DIAGNOSIS — R109 Unspecified abdominal pain: Secondary | ICD-10-CM | POA: Diagnosis not present

## 2020-05-09 DIAGNOSIS — R197 Diarrhea, unspecified: Secondary | ICD-10-CM | POA: Diagnosis not present

## 2020-05-09 DIAGNOSIS — A0472 Enterocolitis due to Clostridium difficile, not specified as recurrent: Secondary | ICD-10-CM | POA: Diagnosis not present

## 2020-05-09 DIAGNOSIS — I509 Heart failure, unspecified: Secondary | ICD-10-CM | POA: Diagnosis not present

## 2020-05-09 DIAGNOSIS — K5790 Diverticulosis of intestine, part unspecified, without perforation or abscess without bleeding: Secondary | ICD-10-CM | POA: Diagnosis not present

## 2020-05-09 DIAGNOSIS — R1032 Left lower quadrant pain: Secondary | ICD-10-CM | POA: Diagnosis not present

## 2020-05-09 DIAGNOSIS — N250 Renal osteodystrophy: Secondary | ICD-10-CM | POA: Diagnosis not present

## 2020-05-10 DIAGNOSIS — Z992 Dependence on renal dialysis: Secondary | ICD-10-CM | POA: Diagnosis not present

## 2020-05-10 DIAGNOSIS — N186 End stage renal disease: Secondary | ICD-10-CM | POA: Diagnosis not present

## 2020-05-12 DIAGNOSIS — Z992 Dependence on renal dialysis: Secondary | ICD-10-CM | POA: Diagnosis not present

## 2020-05-12 DIAGNOSIS — N186 End stage renal disease: Secondary | ICD-10-CM | POA: Diagnosis not present

## 2020-05-14 DIAGNOSIS — Z992 Dependence on renal dialysis: Secondary | ICD-10-CM | POA: Diagnosis not present

## 2020-05-14 DIAGNOSIS — N186 End stage renal disease: Secondary | ICD-10-CM | POA: Diagnosis not present

## 2020-05-17 DIAGNOSIS — N186 End stage renal disease: Secondary | ICD-10-CM | POA: Diagnosis not present

## 2020-05-17 DIAGNOSIS — Z992 Dependence on renal dialysis: Secondary | ICD-10-CM | POA: Diagnosis not present

## 2020-05-17 DIAGNOSIS — E785 Hyperlipidemia, unspecified: Secondary | ICD-10-CM | POA: Diagnosis not present

## 2020-05-18 DIAGNOSIS — N186 End stage renal disease: Secondary | ICD-10-CM | POA: Diagnosis not present

## 2020-05-18 DIAGNOSIS — Z992 Dependence on renal dialysis: Secondary | ICD-10-CM | POA: Diagnosis not present

## 2020-05-18 DIAGNOSIS — E7800 Pure hypercholesterolemia, unspecified: Secondary | ICD-10-CM | POA: Diagnosis not present

## 2020-05-18 DIAGNOSIS — I10 Essential (primary) hypertension: Secondary | ICD-10-CM | POA: Diagnosis not present

## 2020-05-19 DIAGNOSIS — Z992 Dependence on renal dialysis: Secondary | ICD-10-CM | POA: Diagnosis not present

## 2020-05-19 DIAGNOSIS — A0472 Enterocolitis due to Clostridium difficile, not specified as recurrent: Secondary | ICD-10-CM | POA: Diagnosis not present

## 2020-05-19 DIAGNOSIS — N186 End stage renal disease: Secondary | ICD-10-CM | POA: Diagnosis not present

## 2020-05-19 DIAGNOSIS — Z299 Encounter for prophylactic measures, unspecified: Secondary | ICD-10-CM | POA: Diagnosis not present

## 2020-05-19 DIAGNOSIS — I10 Essential (primary) hypertension: Secondary | ICD-10-CM | POA: Diagnosis not present

## 2020-05-19 DIAGNOSIS — I429 Cardiomyopathy, unspecified: Secondary | ICD-10-CM | POA: Diagnosis not present

## 2020-05-21 DIAGNOSIS — N186 End stage renal disease: Secondary | ICD-10-CM | POA: Diagnosis not present

## 2020-05-21 DIAGNOSIS — Z992 Dependence on renal dialysis: Secondary | ICD-10-CM | POA: Diagnosis not present

## 2020-05-24 DIAGNOSIS — Z992 Dependence on renal dialysis: Secondary | ICD-10-CM | POA: Diagnosis not present

## 2020-05-24 DIAGNOSIS — N186 End stage renal disease: Secondary | ICD-10-CM | POA: Diagnosis not present

## 2020-05-26 DIAGNOSIS — N186 End stage renal disease: Secondary | ICD-10-CM | POA: Diagnosis not present

## 2020-05-26 DIAGNOSIS — Z992 Dependence on renal dialysis: Secondary | ICD-10-CM | POA: Diagnosis not present

## 2020-05-28 DIAGNOSIS — Z992 Dependence on renal dialysis: Secondary | ICD-10-CM | POA: Diagnosis not present

## 2020-05-28 DIAGNOSIS — N186 End stage renal disease: Secondary | ICD-10-CM | POA: Diagnosis not present

## 2020-05-31 DIAGNOSIS — N186 End stage renal disease: Secondary | ICD-10-CM | POA: Diagnosis not present

## 2020-05-31 DIAGNOSIS — Z992 Dependence on renal dialysis: Secondary | ICD-10-CM | POA: Diagnosis not present

## 2020-06-02 DIAGNOSIS — N186 End stage renal disease: Secondary | ICD-10-CM | POA: Diagnosis not present

## 2020-06-02 DIAGNOSIS — Z992 Dependence on renal dialysis: Secondary | ICD-10-CM | POA: Diagnosis not present

## 2020-06-04 DIAGNOSIS — Z992 Dependence on renal dialysis: Secondary | ICD-10-CM | POA: Diagnosis not present

## 2020-06-04 DIAGNOSIS — N186 End stage renal disease: Secondary | ICD-10-CM | POA: Diagnosis not present

## 2020-06-06 DIAGNOSIS — H26493 Other secondary cataract, bilateral: Secondary | ICD-10-CM | POA: Diagnosis not present

## 2020-06-06 DIAGNOSIS — Z961 Presence of intraocular lens: Secondary | ICD-10-CM | POA: Diagnosis not present

## 2020-06-06 DIAGNOSIS — H26492 Other secondary cataract, left eye: Secondary | ICD-10-CM | POA: Diagnosis not present

## 2020-06-06 DIAGNOSIS — H26491 Other secondary cataract, right eye: Secondary | ICD-10-CM | POA: Diagnosis not present

## 2020-06-07 DIAGNOSIS — Z992 Dependence on renal dialysis: Secondary | ICD-10-CM | POA: Diagnosis not present

## 2020-06-07 DIAGNOSIS — N186 End stage renal disease: Secondary | ICD-10-CM | POA: Diagnosis not present

## 2020-06-09 DIAGNOSIS — Z992 Dependence on renal dialysis: Secondary | ICD-10-CM | POA: Diagnosis not present

## 2020-06-09 DIAGNOSIS — N186 End stage renal disease: Secondary | ICD-10-CM | POA: Diagnosis not present

## 2020-06-11 DIAGNOSIS — N186 End stage renal disease: Secondary | ICD-10-CM | POA: Diagnosis not present

## 2020-06-11 DIAGNOSIS — Z992 Dependence on renal dialysis: Secondary | ICD-10-CM | POA: Diagnosis not present

## 2020-06-14 DIAGNOSIS — N186 End stage renal disease: Secondary | ICD-10-CM | POA: Diagnosis not present

## 2020-06-14 DIAGNOSIS — Z992 Dependence on renal dialysis: Secondary | ICD-10-CM | POA: Diagnosis not present

## 2020-06-16 DIAGNOSIS — N186 End stage renal disease: Secondary | ICD-10-CM | POA: Diagnosis not present

## 2020-06-16 DIAGNOSIS — Z992 Dependence on renal dialysis: Secondary | ICD-10-CM | POA: Diagnosis not present

## 2020-06-17 DIAGNOSIS — E7800 Pure hypercholesterolemia, unspecified: Secondary | ICD-10-CM | POA: Diagnosis not present

## 2020-06-17 DIAGNOSIS — I10 Essential (primary) hypertension: Secondary | ICD-10-CM | POA: Diagnosis not present

## 2020-06-18 DIAGNOSIS — N186 End stage renal disease: Secondary | ICD-10-CM | POA: Diagnosis not present

## 2020-06-18 DIAGNOSIS — Z992 Dependence on renal dialysis: Secondary | ICD-10-CM | POA: Diagnosis not present

## 2020-06-23 DIAGNOSIS — N186 End stage renal disease: Secondary | ICD-10-CM | POA: Diagnosis not present

## 2020-06-23 DIAGNOSIS — Z992 Dependence on renal dialysis: Secondary | ICD-10-CM | POA: Diagnosis not present

## 2020-06-25 DIAGNOSIS — Z992 Dependence on renal dialysis: Secondary | ICD-10-CM | POA: Diagnosis not present

## 2020-06-25 DIAGNOSIS — N186 End stage renal disease: Secondary | ICD-10-CM | POA: Diagnosis not present

## 2020-06-28 DIAGNOSIS — N186 End stage renal disease: Secondary | ICD-10-CM | POA: Diagnosis not present

## 2020-06-28 DIAGNOSIS — Z992 Dependence on renal dialysis: Secondary | ICD-10-CM | POA: Diagnosis not present

## 2020-06-30 DIAGNOSIS — Z992 Dependence on renal dialysis: Secondary | ICD-10-CM | POA: Diagnosis not present

## 2020-06-30 DIAGNOSIS — N186 End stage renal disease: Secondary | ICD-10-CM | POA: Diagnosis not present

## 2020-07-01 DIAGNOSIS — I10 Essential (primary) hypertension: Secondary | ICD-10-CM | POA: Diagnosis not present

## 2020-07-01 DIAGNOSIS — E7800 Pure hypercholesterolemia, unspecified: Secondary | ICD-10-CM | POA: Diagnosis not present

## 2020-07-02 DIAGNOSIS — Z992 Dependence on renal dialysis: Secondary | ICD-10-CM | POA: Diagnosis not present

## 2020-07-02 DIAGNOSIS — N186 End stage renal disease: Secondary | ICD-10-CM | POA: Diagnosis not present

## 2020-07-05 DIAGNOSIS — N186 End stage renal disease: Secondary | ICD-10-CM | POA: Diagnosis not present

## 2020-07-05 DIAGNOSIS — Z992 Dependence on renal dialysis: Secondary | ICD-10-CM | POA: Diagnosis not present

## 2020-07-07 DIAGNOSIS — N186 End stage renal disease: Secondary | ICD-10-CM | POA: Diagnosis not present

## 2020-07-07 DIAGNOSIS — Z992 Dependence on renal dialysis: Secondary | ICD-10-CM | POA: Diagnosis not present

## 2020-07-09 DIAGNOSIS — Z992 Dependence on renal dialysis: Secondary | ICD-10-CM | POA: Diagnosis not present

## 2020-07-09 DIAGNOSIS — N186 End stage renal disease: Secondary | ICD-10-CM | POA: Diagnosis not present

## 2020-07-12 DIAGNOSIS — Z992 Dependence on renal dialysis: Secondary | ICD-10-CM | POA: Diagnosis not present

## 2020-07-12 DIAGNOSIS — N186 End stage renal disease: Secondary | ICD-10-CM | POA: Diagnosis not present

## 2020-07-14 DIAGNOSIS — Z992 Dependence on renal dialysis: Secondary | ICD-10-CM | POA: Diagnosis not present

## 2020-07-14 DIAGNOSIS — N186 End stage renal disease: Secondary | ICD-10-CM | POA: Diagnosis not present

## 2020-07-16 DIAGNOSIS — N186 End stage renal disease: Secondary | ICD-10-CM | POA: Diagnosis not present

## 2020-07-16 DIAGNOSIS — Z992 Dependence on renal dialysis: Secondary | ICD-10-CM | POA: Diagnosis not present

## 2020-07-19 DIAGNOSIS — Z992 Dependence on renal dialysis: Secondary | ICD-10-CM | POA: Diagnosis not present

## 2020-07-19 DIAGNOSIS — N186 End stage renal disease: Secondary | ICD-10-CM | POA: Diagnosis not present

## 2020-09-19 ENCOUNTER — Other Ambulatory Visit (INDEPENDENT_AMBULATORY_CARE_PROVIDER_SITE_OTHER): Payer: Self-pay | Admitting: Nurse Practitioner

## 2020-09-19 DIAGNOSIS — Z992 Dependence on renal dialysis: Secondary | ICD-10-CM

## 2020-09-21 ENCOUNTER — Encounter (INDEPENDENT_AMBULATORY_CARE_PROVIDER_SITE_OTHER): Payer: Self-pay | Admitting: Nurse Practitioner

## 2020-09-21 ENCOUNTER — Ambulatory Visit (INDEPENDENT_AMBULATORY_CARE_PROVIDER_SITE_OTHER): Payer: Medicare Other | Admitting: Nurse Practitioner

## 2020-09-21 ENCOUNTER — Other Ambulatory Visit: Payer: Self-pay

## 2020-09-21 ENCOUNTER — Ambulatory Visit (INDEPENDENT_AMBULATORY_CARE_PROVIDER_SITE_OTHER): Payer: Medicare Other

## 2020-09-21 VITALS — BP 188/66 | HR 67 | Ht 64.0 in | Wt 123.0 lb

## 2020-09-21 DIAGNOSIS — N186 End stage renal disease: Secondary | ICD-10-CM | POA: Diagnosis not present

## 2020-09-21 DIAGNOSIS — E782 Mixed hyperlipidemia: Secondary | ICD-10-CM | POA: Diagnosis not present

## 2020-09-21 DIAGNOSIS — Z992 Dependence on renal dialysis: Secondary | ICD-10-CM

## 2020-09-21 DIAGNOSIS — I10 Essential (primary) hypertension: Secondary | ICD-10-CM | POA: Diagnosis not present

## 2020-09-21 NOTE — Progress Notes (Signed)
Subjective:    Patient ID: [name], female    DOB: 03/13/1951, 69 y.o.   MRN: [id number] Chief Complaint  Patient presents with  . Follow-up    6 mo HDA    The patient returns to the office for followup of their dialysis access. The function of the access has been stable. The patient denies increased bleeding time or increased recirculation. Patient denies difficulty with cannulation. The patient denies hand pain or other symptoms consistent with steal phenomena.  No significant arm swelling.  The patient denies redness or swelling at the access site. The patient denies fever or chills at home or while on dialysis.  The patient denies amaurosis fugax or recent TIA symptoms. There are no recent neurological changes noted. The patient denies claudication symptoms or rest pain symptoms. The patient denies history of DVT, PE or superficial thrombophlebitis. The patient denies recent episodes of angina or shortness of breath.     The patient has a flow volume of 3368.  The right thigh AV graft has a mid graft dilatation seen at 2.48 cm x 2.75 cm and this is mildly larger than her previous exam on 03/23/2020.   Review of Systems  Neurological: Positive for weakness.  All other systems reviewed and are negative.      Objective:   Physical Exam Vitals reviewed.  HENT:     Head: Normocephalic.  Cardiovascular:     Rate and Rhythm: Normal rate.     Pulses: Normal pulses.     Arteriovenous access: right arteriovenous access is present.    Comments: Good thrill and bruit of right thigh AV graft Pulmonary:     Effort: Pulmonary effort is normal.  Musculoskeletal:        General: Normal range of motion.  Skin:    General: Skin is warm and dry.  Neurological:     Mental Status: She is alert and oriented to person, place, and time.     Motor: Weakness present.  Psychiatric:        Mood and Affect: Mood normal.        Behavior: Behavior normal.        Thought Content: Thought  content normal.        Judgment: Judgment normal.     BP (!) 188/66   Pulse 67   Ht 5\' 4"  (1.626 m)   Wt 123 lb (55.8 kg)   BMI 21.11 kg/m   Past Medical History:  Diagnosis Date  . Chronic systolic heart failure ([place])   . Coronary atherosclerosis of native coronary artery   . Diseases of tricuspid valve   . End stage renal disease ([place])    dialysis T,Th & Sat  . Mitral valve insufficiency and aortic valve insufficiency   . Other and unspecified hyperlipidemia   . Secondary cardiomyopathy, unspecified   . Unspecified essential hypertension     Social History   Socioeconomic History  . Marital status: Single    Spouse name: Not on file  . Number of children: Not on file  . Years of education: Not on file  . Highest education level: Not on file  Occupational History  . Not on file  Tobacco Use  . Smoking status: Never Smoker  . Smokeless tobacco: Never Used  Vaping Use  . Vaping Use: Never used  Substance and Sexual Activity  . Alcohol use: No  . Drug use: No  . Sexual activity: Not on file  Other Topics Concern  .  Not on file  Social History Narrative  . Not on file   Social Determinants of Health   Financial Resource Strain:   . Difficulty of Paying Living Expenses: Not on file  Food Insecurity:   . Worried About Charity fundraiser in the Last Year: Not on file  . Ran Out of Food in the Last Year: Not on file  Transportation Needs:   . Lack of Transportation (Medical): Not on file  . Lack of Transportation (Non-Medical): Not on file  Physical Activity:   . Days of Exercise per Week: Not on file  . Minutes of Exercise per Session: Not on file  Stress:   . Feeling of Stress : Not on file  Social Connections:   . Frequency of Communication with Friends and Family: Not on file  . Frequency of Social Gatherings with Friends and Family: Not on file  . Attends Religious Services: Not on file  . Active Member of Clubs or Organizations: Not on file  .  Attends Archivist Meetings: Not on file  . Marital Status: Not on file  Intimate Partner Violence:   . Fear of Current or Ex-Partner: Not on file  . Emotionally Abused: Not on file  . Physically Abused: Not on file  . Sexually Abused: Not on file    Past Surgical History:  Procedure Laterality Date  . A/V SHUNTOGRAM N/A 08/10/2019   Procedure: A/V SHUNTOGRAM;  Surgeon: Algernon Huxley, MD;  Location: [place] [facility];  Service: Cardiovascular;  Laterality: N/A;  . AV FISTULA PLACEMENT Right 2012  . AV FISTULA PLACEMENT Left 2004  . CATARACT EXTRACTION W/PHACO Left 08/07/2019   Procedure: CATARACT EXTRACTION PHACO AND INTRAOCULAR LENS PLACEMENT LEFT EYE;  Surgeon: [name], MD;  Location: [facility];  Service: Ophthalmology;  Laterality: Left;  left  . CATARACT EXTRACTION W/PHACO Right 08/21/2019   Procedure: CATARACT EXTRACTION PHACO AND INTRAOCULAR LENS PLACEMENT RIGHT EYE (CDE: 5.45);  Surgeon: [name], MD;  Location: [facility];  Service: Ophthalmology;  Laterality: Right;  . INSERTION OF DIALYSIS CATHETER N/A 01/08/2013   Procedure: INSERTION OF DIALYSIS CATHETER;  Surgeon: Angelia Mould, MD;  Location: [place];  Service: Vascular;  Laterality: N/A;  . KIDNEY TRANSPLANT Left 2002   pt. states transplant lasted 2 yrs.  Marland Kitchen PERIPHERAL VASCULAR CATHETERIZATION Right 08/27/2016   Procedure: A/V Shuntogram/Fistulagram;  Surgeon: Algernon Huxley, MD;  Location: [place] [facility];  Service: Cardiovascular;  Laterality: Right;    History reviewed. No pertinent family history.  Allergies  Allergen Reactions  . Dilaudid [Hydromorphone] Nausea And Vomiting  . Dilaudid  [Hydromorphone Hcl]   . Tape Rash    Plastic tape    CBC Latest Ref Rng & Units 08/21/2019 08/07/2019 10/15/2013  WBC 3.6 - 11.0 x10 3/mm 3 - - 14.8(H)  Hemoglobin 12.0 - 15.0 g/dL 8.8(L) 10.2(L) 9.1(L)  Hematocrit 36 - 46 % 26.0(L) 30.0(L) 28.6(L)  Platelets 150 - 440 x10 3/mm 3 - - 340      CMP      Component Value Date/Time   NA 139 08/21/2019 0839   NA 128 (L) 10/12/2013 2231   K 4.2 08/21/2019 0839   K 5.1 10/12/2013 2231   CL 102 08/21/2019 0839   CL 90 (L) 10/12/2013 2231   CO2 28 10/12/2013 2231   GLUCOSE 64 (L) 08/21/2019 0839   GLUCOSE 436 (H) 10/12/2013 2231   BUN 27 (H) 08/21/2019 0839   BUN 78 (H) 10/12/2013  2231   CREATININE 3.40 (H) 08/21/2019 0839   CREATININE 5.60 (H) 10/12/2013 2231   CALCIUM 9.7 10/12/2013 2231   GFRNONAA 7 (L) 10/12/2013 2231   GFRAA 9 (L) 10/12/2013 2231         Assessment & Plan:   1. ESRD on dialysis [facility]) Recommend:  The patient is doing well and currently has adequate dialysis access. The patient's dialysis center is not reporting any access issues. Flow pattern is stable when compared to the prior ultrasound.  The patient should have a duplex ultrasound of the dialysis access in 6 months. The patient will follow-up with me in the office after each ultrasound     2. Essential hypertension Continue antihypertensive medications as already ordered, these medications have been reviewed and there are no changes at this time.   3. Mixed hyperlipidemia Continue statin as ordered and reviewed, no changes at this time    Current Outpatient Medications on File Prior to Visit  Medication Sig Dispense Refill  . acetaminophen (TYLENOL) 500 MG tablet Take 1,000 mg by mouth every 6 (six) hours as needed for pain (headache).    Marland Kitchen aspirin 81 MG tablet Take 81 mg by mouth daily.    Marland Kitchen atorvastatin (LIPITOR) 20 MG tablet Take by mouth.    . B Complex-C-Folic Acid (RENAL) 1 MG CAPS Take by mouth.    . B Complex-C-Zn-Folic Acid (NEPHPLEX RX PO) Take 1 tablet by mouth daily.    . cinacalcet (SENSIPAR) 30 MG tablet Take 30 mg by mouth daily after supper.    . docusate sodium (COLACE) 100 MG capsule Take by mouth.    . gabapentin (NEURONTIN) 300 MG capsule     . HYDROcodone-acetaminophen (NORCO/VICODIN) 5-325 MG tablet     . isosorbide  mononitrate (IMDUR) 30 MG 24 hr tablet Take 30 mg by mouth daily.    Marland Kitchen labetalol (NORMODYNE) 200 MG tablet     . labetalol (NORMODYNE) 300 MG tablet Take 300 mg by mouth 2 (two) times daily.    . minoxidil (LONITEN) 2.5 MG tablet Take 2.5 mg by mouth 2 (two) times daily.    Marland Kitchen omeprazole (PRILOSEC) 40 MG capsule     . sevelamer (RENAGEL) 800 MG tablet Take 800-1,600 mg by mouth See admin instructions. Take 2 tablets by mouth with meals and 1 with snacks.    . sevelamer carbonate (RENVELA) 800 MG tablet Take by mouth.    . simvastatin (ZOCOR) 20 MG tablet Take 20 mg by mouth daily.    . traMADol (ULTRAM) 50 MG tablet Take by mouth.     No current facility-administered medications on file prior to visit.    There are no Patient Instructions on file for this visit. No follow-ups on file.   Kris Hartmann, NP

## 2021-02-20 ENCOUNTER — Encounter (HOSPITAL_COMMUNITY): Payer: Self-pay

## 2021-02-20 ENCOUNTER — Other Ambulatory Visit: Payer: Self-pay

## 2021-02-20 ENCOUNTER — Emergency Department (HOSPITAL_COMMUNITY)
Admission: EM | Admit: 2021-02-20 | Discharge: 2021-02-20 | Disposition: A | Discharge status: Home / Self Care | Payer: Medicare Other | Attending: Emergency Medicine | Admitting: Emergency Medicine

## 2021-02-20 ENCOUNTER — Emergency Department (HOSPITAL_COMMUNITY): Payer: Medicare Other

## 2021-02-20 DIAGNOSIS — I132 Hypertensive heart and chronic kidney disease with heart failure and with stage 5 chronic kidney disease, or end stage renal disease: Secondary | ICD-10-CM | POA: Diagnosis not present

## 2021-02-20 DIAGNOSIS — I2510 Atherosclerotic heart disease of native coronary artery without angina pectoris: Secondary | ICD-10-CM | POA: Diagnosis not present

## 2021-02-20 DIAGNOSIS — Z79899 Other long term (current) drug therapy: Secondary | ICD-10-CM | POA: Diagnosis not present

## 2021-02-20 DIAGNOSIS — R110 Nausea: Secondary | ICD-10-CM | POA: Diagnosis not present

## 2021-02-20 DIAGNOSIS — R109 Unspecified abdominal pain: Secondary | ICD-10-CM | POA: Diagnosis not present

## 2021-02-20 DIAGNOSIS — R1030 Lower abdominal pain, unspecified: Secondary | ICD-10-CM

## 2021-02-20 DIAGNOSIS — N186 End stage renal disease: Secondary | ICD-10-CM | POA: Diagnosis not present

## 2021-02-20 DIAGNOSIS — I5021 Acute systolic (congestive) heart failure: Secondary | ICD-10-CM | POA: Diagnosis not present

## 2021-02-20 DIAGNOSIS — Z992 Dependence on renal dialysis: Secondary | ICD-10-CM | POA: Insufficient documentation

## 2021-02-20 DIAGNOSIS — K5900 Constipation, unspecified: Secondary | ICD-10-CM | POA: Diagnosis not present

## 2021-02-20 DIAGNOSIS — Z7982 Long term (current) use of aspirin: Secondary | ICD-10-CM | POA: Insufficient documentation

## 2021-02-20 LAB — COMPREHENSIVE METABOLIC PANEL
ALT: 14 U/L (ref 0–44)
AST: 19 U/L (ref 15–41)
Albumin: 2.8 g/dL — ABNORMAL LOW (ref 3.5–5.0)
Alkaline Phosphatase: 89 U/L (ref 38–126)
Anion gap: 12 (ref 5–15)
BUN: 38 mg/dL — ABNORMAL HIGH (ref 8–23)
CO2: 25 mmol/L (ref 22–32)
Calcium: 8.6 mg/dL — ABNORMAL LOW (ref 8.9–10.3)
Chloride: 98 mmol/L (ref 98–111)
Creatinine, Ser: 4.89 mg/dL — ABNORMAL HIGH (ref 0.44–1.00)
GFR, Estimated: 9 mL/min — ABNORMAL LOW (ref >60)
Glucose, Bld: 88 mg/dL (ref 70–99)
Potassium: 4 mmol/L (ref 3.5–5.1)
Sodium: 135 mmol/L (ref 135–145)
Total Bilirubin: 0.6 mg/dL (ref 0.3–1.2)
Total Protein: 6.2 g/dL — ABNORMAL LOW (ref 6.5–8.1)

## 2021-02-20 LAB — CBC WITH DIFFERENTIAL/PLATELET
Abs Immature Granulocytes: 0.03 10*3/uL (ref 0.00–0.07)
Basophils Absolute: 0.1 10*3/uL (ref 0.0–0.1)
Basophils Relative: 1 %
Eosinophils Absolute: 0.4 10*3/uL (ref 0.0–0.5)
Eosinophils Relative: 4 %
HCT: 33.6 % — ABNORMAL LOW (ref 36.0–46.0)
Hemoglobin: 10.7 g/dL — ABNORMAL LOW (ref 12.0–15.0)
Immature Granulocytes: 0 %
Lymphocytes Relative: 18 %
Lymphs Abs: 1.5 10*3/uL (ref 0.7–4.0)
MCH: 31.8 pg (ref 26.0–34.0)
MCHC: 31.8 g/dL (ref 30.0–36.0)
MCV: 100 fL (ref 80.0–100.0)
Monocytes Absolute: 0.8 10*3/uL (ref 0.1–1.0)
Monocytes Relative: 9 %
Neutro Abs: 5.9 10*3/uL (ref 1.7–7.7)
Neutrophils Relative %: 68 %
Platelets: 173 10*3/uL (ref 150–400)
RBC: 3.36 MIL/uL — ABNORMAL LOW (ref 3.87–5.11)
RDW: 13.6 % (ref 11.5–15.5)
WBC: 8.6 10*3/uL (ref 4.0–10.5)
nRBC: 0 % (ref 0.0–0.2)

## 2021-02-20 LAB — LACTIC ACID, PLASMA
Lactic Acid, Venous: 1.5 mmol/L (ref 0.5–1.9)
Lactic Acid, Venous: 1.1 mmol/L (ref 0.5–1.9)

## 2021-02-20 LAB — TROPONIN I (HIGH SENSITIVITY)
Troponin I (High Sensitivity): 24 ng/L — ABNORMAL HIGH (ref <18)
Troponin I (High Sensitivity): 21 ng/L — ABNORMAL HIGH (ref <18)

## 2021-02-20 LAB — LIPASE, BLOOD: Lipase: 28 U/L (ref 11–51)

## 2021-02-20 MED ORDER — ONDANSETRON HCL 4 MG/2ML IJ SOLN
4.0000 mg | Freq: Once | INTRAMUSCULAR | Status: AC
  Administered 2021-02-20: 4 mg via INTRAVENOUS
  Filled 2021-02-20: qty 2

## 2021-02-20 MED ORDER — FENTANYL CITRATE (PF) 100 MCG/2ML IJ SOLN
50.0000 ug | Freq: Once | INTRAMUSCULAR | Status: AC
  Administered 2021-02-20: 50 ug via INTRAVENOUS
  Filled 2021-02-20: qty 2

## 2021-02-20 MED ORDER — OXYCODONE-ACETAMINOPHEN 5-325 MG PO TABS: 1.0000 | ORAL_TABLET | Freq: Four times a day (QID) | ORAL | 0 refills | Status: DC | PRN

## 2021-02-20 NOTE — ED Provider Notes (Signed)
Patient with normal white count, normal lactic acid stable troponin.  Symptoms improving.  CT abdomen without unremarkable.  Except for uterine fibroid polycystic kidney disease diverticulosis.  Patient is stable for discharge and will follow up with her family doctor and also is referred to GI   Milton Ferguson, MD 02/20/21 1250

## 2021-02-20 NOTE — ED Notes (Signed)
Unable to draw labs from saline lock.

## 2021-02-20 NOTE — ED Provider Notes (Signed)
[facility] EMERGENCY DEPARTMENT Provider Note   CSN: [id number] Arrival date & time: 02/20/21  9323     History Chief Complaint  Patient presents with  . Constipation  . Nausea  . Abdominal Pain    [name] is a 70 y.o. female.  Patient with history of ESRD on dialysis Tuesday, Thursday, Saturday, chronic systolic heart failure, CAD, hypertension presenting with abdominal pain and nausea since last night.  She describes nausea with diffuse abdominal pain across her entire abdomen.  It is constant with pain 10 out of 10.  No vomiting.  She told the nurse that she has a urge to defecate but did not tell this to me.  Last bowel movement was yesterday.  Denies history of constipation.  Denies black or bloody stools.  No chest pain or shortness of breath.  She is due for dialysis tomorrow and has not missed any sessions. Only abdominal surgery is a history of a failed kidney transplant  The history is provided by the patient and the EMS personnel.  Constipation Associated symptoms: abdominal pain and nausea   Associated symptoms: no dysuria, no fever and no vomiting   Abdominal Pain Associated symptoms: constipation and nausea   Associated symptoms: no dysuria, no fever, no hematuria, no shortness of breath and no vomiting        Past Medical History:  Diagnosis Date  . Chronic systolic heart failure ([place])   . Coronary atherosclerosis of native coronary artery   . Diseases of tricuspid valve   . End stage renal disease ([place])    dialysis T,Th & Sat  . Mitral valve insufficiency and aortic valve insufficiency   . Other and unspecified hyperlipidemia   . Secondary cardiomyopathy, unspecified   . Unspecified essential hypertension     Patient Active Problem List   Diagnosis Date Noted  . Hemodialysis patient ([place]) 12/23/2019  . Osteoarthritis 12/23/2019  . Dilated gallbladder 07/09/2018  . Renal dialysis device, implant, or graft complication 55/73/2202  . Septic  arthritis of shoulder, left ([place]) 11/20/2013  . Loss of weight 08/04/2013  . Red blood cell antibody positive 08/04/2013  . Chest pain 07/27/2013  . Beta-2-microglobulin amyloidosis ([place]) 07/27/2013  . Kidney transplant failure 07/27/2013  . Anemia 10/17/2011  . Polycystic kidney 10/17/2011  . End stage renal disease ([place]) 07/26/2010  . Hyperlipidemia 08/18/2009  . Mitral valve regurgitation 08/18/2009  . TRICUSPID REGURGITATION, SEVERE 08/18/2009  . Essential hypertension 08/18/2009  . Coronary artery disease 08/18/2009  . CARDIOMYOPATHY, SECONDARY 08/18/2009  . SYSTOLIC HEART FAILURE, CHRONIC 08/18/2009    Past Surgical History:  Procedure Laterality Date  . A/V SHUNTOGRAM N/A 08/10/2019   Procedure: A/V SHUNTOGRAM;  Surgeon: Algernon Huxley, MD;  Location: [place] [facility];  Service: Cardiovascular;  Laterality: N/A;  . AV FISTULA PLACEMENT Right 2012  . AV FISTULA PLACEMENT Left 2004  . CATARACT EXTRACTION W/PHACO Left 08/07/2019   Procedure: CATARACT EXTRACTION PHACO AND INTRAOCULAR LENS PLACEMENT LEFT EYE;  Surgeon: [name], MD;  Location: [facility];  Service: Ophthalmology;  Laterality: Left;  left  . CATARACT EXTRACTION W/PHACO Right 08/21/2019   Procedure: CATARACT EXTRACTION PHACO AND INTRAOCULAR LENS PLACEMENT RIGHT EYE (CDE: 5.45);  Surgeon: [name], MD;  Location: [facility];  Service: Ophthalmology;  Laterality: Right;  . INSERTION OF DIALYSIS CATHETER N/A 01/08/2013   Procedure: INSERTION OF DIALYSIS CATHETER;  Surgeon: Angelia Mould, MD;  Location: [place];  Service: Vascular;  Laterality: N/A;  . KIDNEY TRANSPLANT Left  2002   pt. states transplant lasted 2 yrs.  Marland Kitchen PERIPHERAL VASCULAR CATHETERIZATION Right 08/27/2016   Procedure: A/V Shuntogram/Fistulagram;  Surgeon: Algernon Huxley, MD;  Location: [place] [facility];  Service: Cardiovascular;  Laterality: Right;     OB History   No obstetric history on file.     History reviewed. No pertinent family  history.  Social History   Tobacco Use  . Smoking status: Never Smoker  . Smokeless tobacco: Never Used  Vaping Use  . Vaping Use: Never used  Substance Use Topics  . Alcohol use: No  . Drug use: No    Home Medications Prior to Admission medications   Medication Sig Start Date End Date Taking? Authorizing Provider  acetaminophen (TYLENOL) 500 MG tablet Take 1,000 mg by mouth every 6 (six) hours as needed for pain (headache).    [provider]  aspirin 81 MG tablet Take 81 mg by mouth daily.    [provider]  atorvastatin (LIPITOR) 20 MG tablet Take by mouth.    [provider]  B Complex-C-Folic Acid (RENAL) 1 MG CAPS Take by mouth.    [provider]  B Complex-C-Zn-Folic Acid (NEPHPLEX RX PO) Take 1 tablet by mouth daily.    [provider]  cinacalcet (SENSIPAR) 30 MG tablet Take 30 mg by mouth daily after supper.    [provider]  docusate sodium (COLACE) 100 MG capsule Take by mouth. 10/28/13   [provider]  gabapentin (NEURONTIN) 300 MG capsule  10/02/17   [provider]  HYDROcodone-acetaminophen (NORCO/VICODIN) 5-325 MG tablet  10/02/17   [provider]  isosorbide mononitrate (IMDUR) 30 MG 24 hr tablet Take 30 mg by mouth daily.    [provider]  labetalol (NORMODYNE) 200 MG tablet  06/22/20   [provider]  labetalol (NORMODYNE) 300 MG tablet Take 300 mg by mouth 2 (two) times daily.    [provider]  minoxidil (LONITEN) 2.5 MG tablet Take 2.5 mg by mouth 2 (two) times daily.    [provider]  omeprazole (PRILOSEC) 40 MG capsule  10/23/17   [provider]  sevelamer (RENAGEL) 800 MG tablet Take 800-1,600 mg by mouth See admin instructions. Take 2 tablets by mouth with meals and 1 with snacks.    [provider]  sevelamer carbonate (RENVELA) 800 MG tablet Take by mouth.    [provider]  simvastatin (ZOCOR) 20 MG  tablet Take 20 mg by mouth daily.    [provider]  traMADol (ULTRAM) 50 MG tablet Take by mouth.    [provider]    Allergies    Dilaudid [hydromorphone], Dilaudid  [hydromorphone hcl], and Tape  Review of Systems   Review of Systems  Constitutional: Positive for activity change and appetite change. Negative for fever.  HENT: Negative for congestion and rhinorrhea.   Respiratory: Negative for chest tightness and shortness of breath.   Gastrointestinal: Positive for abdominal pain, constipation and nausea. Negative for vomiting.  Genitourinary: Negative for dysuria and hematuria.  Musculoskeletal: Negative for arthralgias and myalgias.  Neurological: Negative for dizziness, weakness and headaches.   all other systems are negative except as noted in the HPI and PMH.    Physical Exam Updated Vital Signs BP (!) 196/73 (BP Location: Right Arm)   Pulse 62   Temp 97.6 F (36.4 C) (Oral)   Resp (!) 22   SpO2 100%   Physical Exam Constitutional:  General: She is in acute distress.     Comments: Uncomfortable  HENT:     Head: Normocephalic and atraumatic.     Nose: Nose normal. No rhinorrhea.     Mouth/Throat:     Mouth: Mucous membranes are moist.  Eyes:     Extraocular Movements: Extraocular movements intact.     Pupils: Pupils are equal, round, and reactive to light.  Cardiovascular:     Rate and Rhythm: Normal rate and regular rhythm.  Pulmonary:     Effort: Pulmonary effort is normal. No respiratory distress.     Breath sounds: No wheezing.  Abdominal:     Tenderness: There is abdominal tenderness. There is guarding.     Comments: Diffuse tenderness with voluntary guarding throughout  Fistula right upper thigh with thrill  Genitourinary:    Comments: Chaperone present, no fecal impaction, no gross blood Musculoskeletal:        General: No swelling or tenderness. Normal range of motion.     Cervical back: Normal range of motion and neck  supple.  Skin:    General: Skin is warm.     Capillary Refill: Capillary refill takes less than 2 seconds.  Neurological:     General: No focal deficit present.     Mental Status: She is alert and oriented to person, place, and time. Mental status is at baseline.     ED Results / Procedures / Treatments   Labs (all labs ordered are listed, but only abnormal results are displayed) Labs Reviewed  CBC WITH DIFFERENTIAL/PLATELET  COMPREHENSIVE METABOLIC PANEL  LIPASE, BLOOD  LACTIC ACID, PLASMA  LACTIC ACID, PLASMA  POC OCCULT BLOOD, ED  TROPONIN I (HIGH SENSITIVITY)    EKG EKG Interpretation  Date/Time:  Monday February 20 2021 06:08:04 EDT Ventricular Rate:  62 PR Interval:  173 QRS Duration: 114 QT Interval:  422 QTC Calculation: 429 R Axis:   42 Text Interpretation: Sinus rhythm Borderline intraventricular conduction delay Borderline T wave abnormalities Minimal ST elevation, anterior leads No significant change was found Confirmed by Ezequiel Essex [phone]) on 02/20/2021 6:23:26 AM   Radiology No results found.  Procedures Procedures   Medications Ordered in ED Medications  fentaNYL (SUBLIMAZE) injection 50 mcg (has no administration in time range)  ondansetron (ZOFRAN) injection 4 mg (has no administration in time range)    ED Course  I have reviewed the triage vital signs and the nursing notes.  Pertinent labs & imaging results that were available during my care of the patient were reviewed by me and considered in my medical decision making (see chart for details).    MDM Rules/Calculators/A&P                         Dialysis patient with diffuse abdominal pain and nausea.  Significant abdominal pain to palpation with nausea.  No fecal impaction.  Denies chest pain.  EKG borderline ST elevation in V2 and V3 with Q waves.  This has progressed since 2016.  Patient denies any chest pain. EKG reviewed with Dr. Audie Box of cardiology who favors LVH and agrees not  STEMI.  He recommends trending troponins.  Patient given symptom control and labs will be obtained.  Including lactate to assess for possibility of ischemic colitis given her severe pain.  Labs pending at shift change.  CT angiogram of abdomen and pelvis will be obtained to evaluate for mesenteric colitis or other acute abdominal pathology. Dr. Roderic Palau to assume care at  shift change. Final Clinical Impression(s) / ED Diagnoses Final diagnoses:  None    Rx / DC Orders ED Discharge Orders    None       Ugochukwu Chichester, Annie Main, MD 02/20/21 [phone]

## 2021-02-20 NOTE — ED Triage Notes (Signed)
Pt reports urge to have bm but is unable, co mid-abdominal pain, constant, 10/10. Pt co of nausea since 0100, denies vomiting. Denies hx of similar episodes.

## 2021-02-20 NOTE — ED Notes (Signed)
Per EMS pt has dialysis tues, thurs, sat.

## 2021-02-20 NOTE — Discharge Instructions (Addendum)
Follow-up with your family doctor later this week for recheck.  You can also follow-up with the stomach specialist Dr. Abbey Chatters if necessary

## 2021-03-17 ENCOUNTER — Other Ambulatory Visit (INDEPENDENT_AMBULATORY_CARE_PROVIDER_SITE_OTHER): Payer: Self-pay | Admitting: Nurse Practitioner

## 2021-03-17 ENCOUNTER — Inpatient Hospital Stay (HOSPITAL_COMMUNITY)
Admission: EM | Admit: 2021-03-17 | Discharge: 2021-03-30 | DRG: 377 | Disposition: A | Discharge status: Skilled Nursing Facility | Payer: Medicare Other | Attending: Internal Medicine | Admitting: Internal Medicine

## 2021-03-17 DIAGNOSIS — Z888 Allergy status to other drugs, medicaments and biological substances status: Secondary | ICD-10-CM

## 2021-03-17 DIAGNOSIS — Z6820 Body mass index (BMI) 20.0-20.9, adult: Secondary | ICD-10-CM

## 2021-03-17 DIAGNOSIS — E785 Hyperlipidemia, unspecified: Secondary | ICD-10-CM | POA: Diagnosis present

## 2021-03-17 DIAGNOSIS — Z20822 Contact with and (suspected) exposure to covid-19: Secondary | ICD-10-CM | POA: Diagnosis present

## 2021-03-17 DIAGNOSIS — I428 Other cardiomyopathies: Secondary | ICD-10-CM | POA: Diagnosis present

## 2021-03-17 DIAGNOSIS — I132 Hypertensive heart and chronic kidney disease with heart failure and with stage 5 chronic kidney disease, or end stage renal disease: Secondary | ICD-10-CM | POA: Diagnosis present

## 2021-03-17 DIAGNOSIS — I340 Nonrheumatic mitral (valve) insufficiency: Secondary | ICD-10-CM | POA: Diagnosis present

## 2021-03-17 DIAGNOSIS — R630 Anorexia: Secondary | ICD-10-CM | POA: Diagnosis present

## 2021-03-17 DIAGNOSIS — N186 End stage renal disease: Secondary | ICD-10-CM

## 2021-03-17 DIAGNOSIS — I10 Essential (primary) hypertension: Secondary | ICD-10-CM | POA: Diagnosis present

## 2021-03-17 DIAGNOSIS — Z9841 Cataract extraction status, right eye: Secondary | ICD-10-CM

## 2021-03-17 DIAGNOSIS — Z7982 Long term (current) use of aspirin: Secondary | ICD-10-CM

## 2021-03-17 DIAGNOSIS — D631 Anemia in chronic kidney disease: Secondary | ICD-10-CM | POA: Diagnosis present

## 2021-03-17 DIAGNOSIS — R5381 Other malaise: Secondary | ICD-10-CM | POA: Diagnosis present

## 2021-03-17 DIAGNOSIS — D62 Acute posthemorrhagic anemia: Secondary | ICD-10-CM

## 2021-03-17 DIAGNOSIS — K922 Gastrointestinal hemorrhage, unspecified: Secondary | ICD-10-CM

## 2021-03-17 DIAGNOSIS — I5022 Chronic systolic (congestive) heart failure: Secondary | ICD-10-CM | POA: Diagnosis present

## 2021-03-17 DIAGNOSIS — K651 Peritoneal abscess: Secondary | ICD-10-CM | POA: Diagnosis present

## 2021-03-17 DIAGNOSIS — I2510 Atherosclerotic heart disease of native coronary artery without angina pectoris: Secondary | ICD-10-CM | POA: Diagnosis present

## 2021-03-17 DIAGNOSIS — K449 Diaphragmatic hernia without obstruction or gangrene: Secondary | ICD-10-CM

## 2021-03-17 DIAGNOSIS — Z992 Dependence on renal dialysis: Secondary | ICD-10-CM

## 2021-03-17 DIAGNOSIS — K640 First degree hemorrhoids: Secondary | ICD-10-CM | POA: Diagnosis present

## 2021-03-17 DIAGNOSIS — K921 Melena: Principal | ICD-10-CM

## 2021-03-17 DIAGNOSIS — R531 Weakness: Secondary | ICD-10-CM | POA: Diagnosis present

## 2021-03-17 DIAGNOSIS — K219 Gastro-esophageal reflux disease without esophagitis: Secondary | ICD-10-CM | POA: Diagnosis present

## 2021-03-17 DIAGNOSIS — Z79899 Other long term (current) drug therapy: Secondary | ICD-10-CM

## 2021-03-17 DIAGNOSIS — D72829 Elevated white blood cell count, unspecified: Secondary | ICD-10-CM

## 2021-03-17 DIAGNOSIS — R933 Abnormal findings on diagnostic imaging of other parts of digestive tract: Secondary | ICD-10-CM

## 2021-03-17 DIAGNOSIS — K631 Perforation of intestine (nontraumatic): Secondary | ICD-10-CM | POA: Diagnosis present

## 2021-03-17 DIAGNOSIS — D124 Benign neoplasm of descending colon: Secondary | ICD-10-CM

## 2021-03-17 DIAGNOSIS — Z961 Presence of intraocular lens: Secondary | ICD-10-CM | POA: Diagnosis present

## 2021-03-17 DIAGNOSIS — Z9842 Cataract extraction status, left eye: Secondary | ICD-10-CM

## 2021-03-17 DIAGNOSIS — R1084 Generalized abdominal pain: Secondary | ICD-10-CM

## 2021-03-17 NOTE — ED Provider Notes (Signed)
[facility] Provider Note   CSN: [id number] Arrival date & time: 03/17/21  2329     History No chief complaint on file.   [name] is a 70 y.o. female.  Patient presents to the emergency department with complaints of abdominal distention, diffuse abdominal pain and black, tarry stools.  Patient reports that symptoms have been ongoing for 2 weeks.  She has not had any fever.  Denies nausea and vomiting.        Past Medical History:  Diagnosis Date  . Chronic systolic heart failure ([place])   . Coronary atherosclerosis of native coronary artery   . Diseases of tricuspid valve   . End stage renal disease ([place])    dialysis T,Th & Sat  . Mitral valve insufficiency and aortic valve insufficiency   . Other and unspecified hyperlipidemia   . Secondary cardiomyopathy, unspecified   . Unspecified essential hypertension     Patient Active Problem List   Diagnosis Date Noted  . Hemodialysis patient ([place]) 12/23/2019  . Osteoarthritis 12/23/2019  . Dilated gallbladder 07/09/2018  . Renal dialysis device, implant, or graft complication 26/83/4196  . Septic arthritis of shoulder, left ([place]) 11/20/2013  . Loss of weight 08/04/2013  . Red blood cell antibody positive 08/04/2013  . Chest pain 07/27/2013  . Beta-2-microglobulin amyloidosis ([place]) 07/27/2013  . Kidney transplant failure 07/27/2013  . Anemia 10/17/2011  . Polycystic kidney 10/17/2011  . End stage renal disease ([place]) 07/26/2010  . Hyperlipidemia 08/18/2009  . Mitral valve regurgitation 08/18/2009  . TRICUSPID REGURGITATION, SEVERE 08/18/2009  . Essential hypertension 08/18/2009  . Coronary artery disease 08/18/2009  . CARDIOMYOPATHY, SECONDARY 08/18/2009  . SYSTOLIC HEART FAILURE, CHRONIC 08/18/2009    Past Surgical History:  Procedure Laterality Date  . A/V SHUNTOGRAM N/A 08/10/2019   Procedure: A/V SHUNTOGRAM;  Surgeon: Algernon Huxley, MD;  Location: [facility];  Service:  Cardiovascular;  Laterality: N/A;  . AV FISTULA PLACEMENT Right 2012  . AV FISTULA PLACEMENT Left 2004  . CATARACT EXTRACTION W/PHACO Left 08/07/2019   Procedure: CATARACT EXTRACTION PHACO AND INTRAOCULAR LENS PLACEMENT LEFT EYE;  Surgeon: [name], MD;  Location: [facility];  Service: Ophthalmology;  Laterality: Left;  left  . CATARACT EXTRACTION W/PHACO Right 08/21/2019   Procedure: CATARACT EXTRACTION PHACO AND INTRAOCULAR LENS PLACEMENT RIGHT EYE (CDE: 5.45);  Surgeon: [name], MD;  Location: [facility];  Service: Ophthalmology;  Laterality: Right;  . INSERTION OF DIALYSIS CATHETER N/A 01/08/2013   Procedure: INSERTION OF DIALYSIS CATHETER;  Surgeon: Angelia Mould, MD;  Location: [facility];  Service: Vascular;  Laterality: N/A;  . KIDNEY TRANSPLANT Left 2002   pt. states transplant lasted 2 yrs.  Marland Kitchen PERIPHERAL VASCULAR CATHETERIZATION Right 08/27/2016   Procedure: A/V Shuntogram/Fistulagram;  Surgeon: Algernon Huxley, MD;  Location: [facility] [facility];  Service: Cardiovascular;  Laterality: Right;     OB History   No obstetric history on file.     No family history on file.  Social History   Tobacco Use  . Smoking status: Never Smoker  . Smokeless tobacco: Never Used  Vaping Use  . Vaping Use: Never used  Substance Use Topics  . Alcohol use: No  . Drug use: No    Home Medications Prior to Admission medications   Medication Sig Start Date End Date Taking? Authorizing Provider  acetaminophen (TYLENOL) 500 MG tablet Take 1,000 mg by mouth every 6 (six) hours as needed for pain (headache).  [provider]  aspirin 81 MG tablet Take 81 mg by mouth daily.    [provider]  atorvastatin (LIPITOR) 20 MG tablet Take by mouth.    [provider]  B Complex-C-Folic Acid (RENAL) 1 MG CAPS Take by mouth.    [provider]  B Complex-C-Zn-Folic Acid (NEPHPLEX RX PO) Take 1 tablet by mouth daily.    [provider]  cinacalcet  (SENSIPAR) 30 MG tablet Take 30 mg by mouth daily after supper.    [provider]  docusate sodium (COLACE) 100 MG capsule Take by mouth. 10/28/13   [provider]  gabapentin (NEURONTIN) 300 MG capsule  10/02/17   [provider]  HYDROcodone-acetaminophen (NORCO/VICODIN) 5-325 MG tablet  10/02/17   [provider]  isosorbide mononitrate (IMDUR) 30 MG 24 hr tablet Take 30 mg by mouth daily.    [provider]  labetalol (NORMODYNE) 200 MG tablet  06/22/20   [provider]  labetalol (NORMODYNE) 300 MG tablet Take 300 mg by mouth 2 (two) times daily.    [provider]  minoxidil (LONITEN) 2.5 MG tablet Take 2.5 mg by mouth 2 (two) times daily.    [provider]  omeprazole (PRILOSEC) 40 MG capsule  10/23/17   [provider]  oxyCODONE-acetaminophen (PERCOCET) 5-325 MG tablet Take 1 tablet by mouth every 6 (six) hours as needed. 02/20/21   Milton Ferguson, MD  sevelamer (RENAGEL) 800 MG tablet Take 800-1,600 mg by mouth See admin instructions. Take 2 tablets by mouth with meals and 1 with snacks.    [provider]  sevelamer carbonate (RENVELA) 800 MG tablet Take by mouth.    [provider]  simvastatin (ZOCOR) 20 MG tablet Take 20 mg by mouth daily.    [provider]  traMADol (ULTRAM) 50 MG tablet Take by mouth.    [provider]    Allergies    Dilaudid [hydromorphone], Dilaudid  [hydromorphone hcl], and Tape  Review of Systems   Review of Systems  Gastrointestinal: Positive for abdominal distention and abdominal pain.  All other systems reviewed and are negative.   Physical Exam Updated Vital Signs There were no vitals taken for this visit.  Physical Exam Vitals and nursing note reviewed.  Constitutional:      General: She is not in acute distress.    Appearance: Normal appearance. She is well-developed.  HENT:     Head: Normocephalic and atraumatic.      Right Ear: Hearing normal.     Left Ear: Hearing normal.     Nose: Nose normal.  Eyes:     Conjunctiva/sclera: Conjunctivae normal.     Pupils: Pupils are equal, round, and reactive to light.  Cardiovascular:     Rate and Rhythm: Regular rhythm.     Heart sounds: S1 normal and S2 normal. No murmur heard. No friction rub. No gallop.   Pulmonary:     Effort: Pulmonary effort is normal. No respiratory distress.     Breath sounds: Normal breath sounds.  Chest:     Chest wall: No tenderness.  Abdominal:     General: Bowel sounds are increased. There is distension.     Palpations: Abdomen is soft.     Tenderness: There is generalized abdominal tenderness. There is no guarding or rebound. Negative signs include Murphy's sign and McBurney's sign.     Hernia: No hernia is present.  Musculoskeletal:        General: Normal range  of motion.     Cervical back: Normal range of motion and neck supple.  Skin:    General: Skin is warm and dry.     Findings: No rash.  Neurological:     Mental Status: She is alert and oriented to person, place, and time.     GCS: GCS eye subscore is 4. GCS verbal subscore is 5. GCS motor subscore is 6.     Cranial Nerves: No cranial nerve deficit.     Sensory: No sensory deficit.     Coordination: Coordination normal.  Psychiatric:        Speech: Speech normal.        Behavior: Behavior normal.        Thought Content: Thought content normal.     ED Results / Procedures / Treatments   Labs (all labs ordered are listed, but only abnormal results are displayed) Labs Reviewed  CBC WITH DIFFERENTIAL/PLATELET  COMPREHENSIVE METABOLIC PANEL  PROTIME-INR  TYPE AND SCREEN    EKG EKG Interpretation  Date/Time:  Saturday March 18 2021 00:09:28 EDT Ventricular Rate:  66 PR Interval:  170 QRS Duration: 100 QT Interval:  447 QTC Calculation: 469 R Axis:   42 Text Interpretation: Sinus rhythm Low voltage, precordial leads Nonspecific T abnormalities,  inferior leads Baseline wander in lead(s) V5 Confirmed by Orpah Greek [id number]) on 03/18/2021 1:28:51 AM   Radiology No results found.  Procedures Procedures   Medications Ordered in ED Medications - No data to display  ED Course  I have reviewed the triage vital signs and the nursing notes.  Pertinent labs & imaging results that were available during my care of the patient were reviewed by me and considered in my medical decision making (see chart for details).    MDM Rules/Calculators/A&P                          Patient presented to the emergency department for evaluation of abdominal pain and rectal bleeding.  She reports that this has been ongoing for more than a week.  She says that she has black tarry stools upon arrival but when I performed a rectal she had gross blood that was maroon in color.  Patient with stable vital signs but decreasing hemoglobin.  CT scan performed because of the bleeding and pain in the setting of a new leukocytosis.  CT scan pending at time of shift change.  Signed out to oncoming ER physician to follow-up.  Final Clinical Impression(s) / ED Diagnoses Final diagnoses:  Mesenteric abscess ([place])  Melena  ESRD on dialysis ([place])  Leukocytosis, unspecified type  Gastrointestinal hemorrhage, unspecified gastrointestinal hemorrhage type    Rx / DC Orders ED Discharge Orders    None       Orpah Greek, MD 03/19/21 [phone]

## 2021-03-17 NOTE — ED Triage Notes (Signed)
Pt BIB [facility] c/o bloody stools x 2 weeks, says today they are black. Also c/o chest tightness, non radiating, denies SOB. Dialysis on T/TH/SAT, last went Thursday.

## 2021-03-18 ENCOUNTER — Emergency Department (HOSPITAL_COMMUNITY): Payer: Medicare Other

## 2021-03-18 ENCOUNTER — Inpatient Hospital Stay (HOSPITAL_COMMUNITY): Payer: Medicare Other

## 2021-03-18 DIAGNOSIS — Z6820 Body mass index (BMI) 20.0-20.9, adult: Secondary | ICD-10-CM | POA: Diagnosis not present

## 2021-03-18 DIAGNOSIS — K219 Gastro-esophageal reflux disease without esophagitis: Secondary | ICD-10-CM | POA: Diagnosis present

## 2021-03-18 DIAGNOSIS — K640 First degree hemorrhoids: Secondary | ICD-10-CM | POA: Diagnosis present

## 2021-03-18 DIAGNOSIS — K921 Melena: Secondary | ICD-10-CM | POA: Diagnosis present

## 2021-03-18 DIAGNOSIS — K651 Peritoneal abscess: Secondary | ICD-10-CM | POA: Diagnosis not present

## 2021-03-18 DIAGNOSIS — I428 Other cardiomyopathies: Secondary | ICD-10-CM | POA: Diagnosis present

## 2021-03-18 DIAGNOSIS — D124 Benign neoplasm of descending colon: Secondary | ICD-10-CM | POA: Diagnosis not present

## 2021-03-18 DIAGNOSIS — D72829 Elevated white blood cell count, unspecified: Secondary | ICD-10-CM | POA: Diagnosis not present

## 2021-03-18 DIAGNOSIS — I5022 Chronic systolic (congestive) heart failure: Secondary | ICD-10-CM | POA: Diagnosis present

## 2021-03-18 DIAGNOSIS — I2510 Atherosclerotic heart disease of native coronary artery without angina pectoris: Secondary | ICD-10-CM | POA: Diagnosis present

## 2021-03-18 DIAGNOSIS — R5381 Other malaise: Secondary | ICD-10-CM | POA: Diagnosis present

## 2021-03-18 DIAGNOSIS — I132 Hypertensive heart and chronic kidney disease with heart failure and with stage 5 chronic kidney disease, or end stage renal disease: Secondary | ICD-10-CM | POA: Diagnosis present

## 2021-03-18 DIAGNOSIS — R531 Weakness: Secondary | ICD-10-CM | POA: Diagnosis present

## 2021-03-18 DIAGNOSIS — Z888 Allergy status to other drugs, medicaments and biological substances status: Secondary | ICD-10-CM | POA: Diagnosis not present

## 2021-03-18 DIAGNOSIS — K631 Perforation of intestine (nontraumatic): Secondary | ICD-10-CM

## 2021-03-18 DIAGNOSIS — N186 End stage renal disease: Secondary | ICD-10-CM | POA: Diagnosis present

## 2021-03-18 DIAGNOSIS — Z20822 Contact with and (suspected) exposure to covid-19: Secondary | ICD-10-CM | POA: Diagnosis present

## 2021-03-18 DIAGNOSIS — K449 Diaphragmatic hernia without obstruction or gangrene: Secondary | ICD-10-CM | POA: Diagnosis present

## 2021-03-18 DIAGNOSIS — I10 Essential (primary) hypertension: Secondary | ICD-10-CM | POA: Diagnosis not present

## 2021-03-18 DIAGNOSIS — Z7982 Long term (current) use of aspirin: Secondary | ICD-10-CM | POA: Diagnosis not present

## 2021-03-18 DIAGNOSIS — Z9842 Cataract extraction status, left eye: Secondary | ICD-10-CM | POA: Diagnosis not present

## 2021-03-18 DIAGNOSIS — R933 Abnormal findings on diagnostic imaging of other parts of digestive tract: Secondary | ICD-10-CM | POA: Diagnosis not present

## 2021-03-18 DIAGNOSIS — R1084 Generalized abdominal pain: Secondary | ICD-10-CM | POA: Diagnosis not present

## 2021-03-18 DIAGNOSIS — Z961 Presence of intraocular lens: Secondary | ICD-10-CM | POA: Diagnosis present

## 2021-03-18 DIAGNOSIS — Z992 Dependence on renal dialysis: Secondary | ICD-10-CM | POA: Diagnosis not present

## 2021-03-18 DIAGNOSIS — D631 Anemia in chronic kidney disease: Secondary | ICD-10-CM | POA: Diagnosis present

## 2021-03-18 DIAGNOSIS — E785 Hyperlipidemia, unspecified: Secondary | ICD-10-CM | POA: Diagnosis present

## 2021-03-18 DIAGNOSIS — Z9841 Cataract extraction status, right eye: Secondary | ICD-10-CM | POA: Diagnosis not present

## 2021-03-18 DIAGNOSIS — Z79899 Other long term (current) drug therapy: Secondary | ICD-10-CM | POA: Diagnosis not present

## 2021-03-18 DIAGNOSIS — D62 Acute posthemorrhagic anemia: Secondary | ICD-10-CM | POA: Diagnosis present

## 2021-03-18 DIAGNOSIS — I340 Nonrheumatic mitral (valve) insufficiency: Secondary | ICD-10-CM | POA: Diagnosis not present

## 2021-03-18 DIAGNOSIS — R630 Anorexia: Secondary | ICD-10-CM | POA: Diagnosis present

## 2021-03-18 LAB — COMPREHENSIVE METABOLIC PANEL
ALT: 13 U/L (ref 0–44)
AST: 18 U/L (ref 15–41)
Albumin: 2.5 g/dL — ABNORMAL LOW (ref 3.5–5.0)
Alkaline Phosphatase: 73 U/L (ref 38–126)
Anion gap: 11 (ref 5–15)
BUN: 33 mg/dL — ABNORMAL HIGH (ref 8–23)
CO2: 27 mmol/L (ref 22–32)
Calcium: 9.3 mg/dL (ref 8.9–10.3)
Chloride: 96 mmol/L — ABNORMAL LOW (ref 98–111)
Creatinine, Ser: 4.05 mg/dL — ABNORMAL HIGH (ref 0.44–1.00)
GFR, Estimated: 11 mL/min — ABNORMAL LOW (ref >60)
Glucose, Bld: 118 mg/dL — ABNORMAL HIGH (ref 70–99)
Potassium: 3.5 mmol/L (ref 3.5–5.1)
Sodium: 134 mmol/L — ABNORMAL LOW (ref 135–145)
Total Bilirubin: 0.6 mg/dL (ref 0.3–1.2)
Total Protein: 6 g/dL — ABNORMAL LOW (ref 6.5–8.1)

## 2021-03-18 LAB — CBC WITH DIFFERENTIAL/PLATELET
Abs Immature Granulocytes: 0.14 10*3/uL — ABNORMAL HIGH (ref 0.00–0.07)
Basophils Absolute: 0.1 10*3/uL (ref 0.0–0.1)
Basophils Relative: 0 %
Eosinophils Absolute: 0 10*3/uL (ref 0.0–0.5)
Eosinophils Relative: 0 %
HCT: 30.2 % — ABNORMAL LOW (ref 36.0–46.0)
Hemoglobin: 9.5 g/dL — ABNORMAL LOW (ref 12.0–15.0)
Immature Granulocytes: 1 %
Lymphocytes Relative: 5 %
Lymphs Abs: 1.1 10*3/uL (ref 0.7–4.0)
MCH: 31.9 pg (ref 26.0–34.0)
MCHC: 31.5 g/dL (ref 30.0–36.0)
MCV: 101.3 fL — ABNORMAL HIGH (ref 80.0–100.0)
Monocytes Absolute: 0.8 10*3/uL (ref 0.1–1.0)
Monocytes Relative: 4 %
Neutro Abs: 18.2 10*3/uL — ABNORMAL HIGH (ref 1.7–7.7)
Neutrophils Relative %: 90 %
Platelets: 218 10*3/uL (ref 150–400)
RBC: 2.98 MIL/uL — ABNORMAL LOW (ref 3.87–5.11)
RDW: 13.2 % (ref 11.5–15.5)
WBC: 20.3 10*3/uL — ABNORMAL HIGH (ref 4.0–10.5)
nRBC: 0 % (ref 0.0–0.2)

## 2021-03-18 LAB — LACTIC ACID, PLASMA: Lactic Acid, Venous: 2 mmol/L (ref 0.5–1.9)

## 2021-03-18 LAB — RESP PANEL BY RT-PCR (FLU A&B, COVID) ARPGX2
Influenza A by PCR: NEGATIVE
Influenza B by PCR: NEGATIVE
SARS Coronavirus 2 by RT PCR: NEGATIVE

## 2021-03-18 LAB — PREPARE RBC (CROSSMATCH)

## 2021-03-18 LAB — HEPATITIS B SURFACE ANTIGEN: Hepatitis B Surface Ag: NONREACTIVE

## 2021-03-18 LAB — PROTIME-INR
INR: 1.2 (ref 0.8–1.2)
Prothrombin Time: 15.4 seconds — ABNORMAL HIGH (ref 11.4–15.2)

## 2021-03-18 LAB — ABO/RH: ABO/RH(D): O POS

## 2021-03-18 LAB — POC OCCULT BLOOD, ED: Fecal Occult Bld: POSITIVE — AB

## 2021-03-18 MED ORDER — PIPERACILLIN-TAZOBACTAM 3.375 G IVPB
3.3750 g | Freq: Two times a day (BID) | INTRAVENOUS | Status: DC
  Administered 2021-03-18 – 2021-03-19 (×3): 3.375 g via INTRAVENOUS
  Filled 2021-03-18 (×3): qty 50

## 2021-03-18 MED ORDER — VANCOMYCIN HCL IN DEXTROSE 1-5 GM/200ML-% IV SOLN
INTRAVENOUS | Status: AC
  Filled 2021-03-18: qty 200

## 2021-03-18 MED ORDER — ONDANSETRON HCL 4 MG PO TABS: 4.0000 mg | ORAL_TABLET | Freq: Four times a day (QID) | ORAL | Status: DC | PRN

## 2021-03-18 MED ORDER — SODIUM CHLORIDE 0.9% FLUSH
3.0000 mL | INTRAVENOUS | Status: DC | PRN
  Administered 2021-03-26: 3 mL via INTRAVENOUS

## 2021-03-18 MED ORDER — SODIUM CHLORIDE 0.9 % IV SOLN: 250.0000 mL | INTRAVENOUS | Status: DC | PRN

## 2021-03-18 MED ORDER — IOHEXOL 300 MG/ML  SOLN
100.0000 mL | Freq: Once | INTRAMUSCULAR | Status: AC | PRN
  Administered 2021-03-18: 100 mL via INTRAVENOUS

## 2021-03-18 MED ORDER — CHLORHEXIDINE GLUCONATE CLOTH 2 % EX PADS
6.0000 | MEDICATED_PAD | Freq: Every day | CUTANEOUS | Status: DC
  Administered 2021-03-19 – 2021-03-30 (×10): 6 via TOPICAL

## 2021-03-18 MED ORDER — VANCOMYCIN HCL IN DEXTROSE 500-5 MG/100ML-% IV SOLN
500.0000 mg | INTRAVENOUS | Status: DC
  Filled 2021-03-18: qty 100

## 2021-03-18 MED ORDER — SODIUM CHLORIDE 0.9 % IV SOLN: 10.0000 mL/h | Freq: Once | INTRAVENOUS | Status: DC

## 2021-03-18 MED ORDER — ACETAMINOPHEN 650 MG RE SUPP: 650.0000 mg | Freq: Four times a day (QID) | RECTAL | Status: DC | PRN

## 2021-03-18 MED ORDER — ONDANSETRON HCL 4 MG/2ML IJ SOLN: 4.0000 mg | Freq: Four times a day (QID) | INTRAMUSCULAR | Status: DC | PRN

## 2021-03-18 MED ORDER — LABETALOL HCL 5 MG/ML IV SOLN: 10.0000 mg | INTRAVENOUS | Status: DC | PRN

## 2021-03-18 MED ORDER — SODIUM CHLORIDE 0.9% FLUSH
3.0000 mL | Freq: Two times a day (BID) | INTRAVENOUS | Status: DC
  Administered 2021-03-18 – 2021-03-30 (×19): 3 mL via INTRAVENOUS

## 2021-03-18 MED ORDER — HYDROMORPHONE HCL 1 MG/ML IJ SOLN: 0.5000 mg | INTRAMUSCULAR | Status: DC | PRN

## 2021-03-18 MED ORDER — VANCOMYCIN HCL IN DEXTROSE 1-5 GM/200ML-% IV SOLN
1000.0000 mg | Freq: Once | INTRAVENOUS | Status: AC
  Administered 2021-03-18: 1000 mg via INTRAVENOUS
  Filled 2021-03-18: qty 200

## 2021-03-18 MED ORDER — FENTANYL CITRATE (PF) 100 MCG/2ML IJ SOLN
40.0000 ug | Freq: Once | INTRAMUSCULAR | Status: AC
  Administered 2021-03-18: 40 ug via INTRAVENOUS
  Filled 2021-03-18: qty 2

## 2021-03-18 MED ORDER — ACETAMINOPHEN 325 MG PO TABS
650.0000 mg | ORAL_TABLET | Freq: Four times a day (QID) | ORAL | Status: DC | PRN
  Administered 2021-03-20 – 2021-03-21 (×2): 650 mg via ORAL
  Filled 2021-03-18 (×2): qty 2

## 2021-03-18 NOTE — Progress Notes (Signed)
Report given to [facility], RN on 6E.

## 2021-03-18 NOTE — H&P (Signed)
History and Physical    [name]:[id number] DOB: 1951/11/18 DOA: 03/17/2021  PCP: Glenda Chroman, MD   Patient coming from: Home  Chief Complaint: Abdominal pain/distention  HPI: [name] is a 70 y.o. female with medical history significant for ESRD on HD TTS, hypertension, dyslipidemia, chronic anemia, and GERD who presented to the ED with complaints of worsening abdominal distention as well as diffuse abdominal pain associated with black and tarry stools.  Apparently symptoms have been ongoing for approximately 2 weeks.  No particular aggravating or alleviating factors noted.  She denies any fevers or chills, or nausea or vomiting.  She has had poor appetite and denies any bowel movements in the last 1 week.   ED Course: Vital signs stable and patient is afebrile.  She has no tenderness marked leukocytosis of 20,300, hemoglobin 9.5, and creatinine 4.0, consistent with her end-stage renal disease.  Lactic acid level is 2.0.  She has been started empirically on vancomycin and Zosyn.  EDP working on IV access as this appears to be quite poor.  Stool occult noted to be positive.  CT of the abdomen pelvis with contrast demonstrates a large collection of fluid with gas and debris consistent with a mesenteric abscess likely related to a small bowel perforation.  Review of Systems: Reviewed as noted above, otherwise negative.  Past Medical History:  Diagnosis Date  . Chronic systolic heart failure ([place])   . Coronary atherosclerosis of native coronary artery   . Diseases of tricuspid valve   . End stage renal disease ([place])    dialysis T,Th & Sat  . Mitral valve insufficiency and aortic valve insufficiency   . Other and unspecified hyperlipidemia   . Secondary cardiomyopathy, unspecified   . Unspecified essential hypertension     Past Surgical History:  Procedure Laterality Date  . A/V SHUNTOGRAM N/A 08/10/2019   Procedure: A/V SHUNTOGRAM;  Surgeon: Algernon Huxley, MD;  Location: [place] [facility];  Service: Cardiovascular;  Laterality: N/A;  . AV FISTULA PLACEMENT Right 2012  . AV FISTULA PLACEMENT Left 2004  . CATARACT EXTRACTION W/PHACO Left 08/07/2019   Procedure: CATARACT EXTRACTION PHACO AND INTRAOCULAR LENS PLACEMENT LEFT EYE;  Surgeon: [name], MD;  [facility];  Service: Ophthalmology;  Laterality: Left;  left  . CATARACT EXTRACTION W/PHACO Right 08/21/2019   Procedure: CATARACT EXTRACTION PHACO AND INTRAOCULAR LENS PLACEMENT RIGHT EYE (CDE: 5.45);  Surgeon: [name], MD;  Location: [facility];  Service: Ophthalmology;  Laterality: Right;  . INSERTION OF DIALYSIS CATHETER N/A 01/08/2013   Procedure: INSERTION OF DIALYSIS CATHETER;  Surgeon: Angelia Mould, MD;  Location: [facility];  Service: Vascular;  Laterality: N/A;  . KIDNEY TRANSPLANT Left 2002   pt. states transplant lasted 2 yrs.  Marland Kitchen PERIPHERAL VASCULAR CATHETERIZATION Right 08/27/2016   Procedure: A/V Shuntogram/Fistulagram;  Surgeon: Algernon Huxley, MD;  Location: [facility];  Service: Cardiovascular;  Laterality: Right;     reports that she has never smoked. She has never used smokeless tobacco. She reports that she does not drink alcohol and does not use drugs.  Allergies  Allergen Reactions  . Dilaudid [Hydromorphone] Nausea And Vomiting  . Dilaudid  [Hydromorphone Hcl]   . Tape Rash    Plastic tape    No family history on file.  Prior to Admission medications   Medication Sig Start Date End Date Taking? Authorizing Provider  acetaminophen (TYLENOL) 500 MG tablet Take 1,000 mg by mouth every 6 (  six) hours as needed for pain (headache).    [provider]  aspirin 81 MG tablet Take 81 mg by mouth daily.    [provider]  atorvastatin (LIPITOR) 20 MG tablet Take by mouth.    [provider]  B Complex-C-Folic Acid (RENAL) 1 MG CAPS Take by mouth.    [provider]  B Complex-C-Zn-Folic Acid (NEPHPLEX RX PO) Take 1 tablet by mouth  daily.    [provider]  cinacalcet (SENSIPAR) 30 MG tablet Take 30 mg by mouth daily after supper.    [provider]  docusate sodium (COLACE) 100 MG capsule Take by mouth. 10/28/13   [provider]  gabapentin (NEURONTIN) 300 MG capsule  10/02/17   [provider]  HYDROcodone-acetaminophen (NORCO/VICODIN) 5-325 MG tablet  10/02/17   [provider]  isosorbide mononitrate (IMDUR) 30 MG 24 hr tablet Take 30 mg by mouth daily.    [provider]  labetalol (NORMODYNE) 200 MG tablet  06/22/20   [provider]  labetalol (NORMODYNE) 300 MG tablet Take 300 mg by mouth 2 (two) times daily.    [provider]  minoxidil (LONITEN) 2.5 MG tablet Take 2.5 mg by mouth 2 (two) times daily.    [provider]  omeprazole (PRILOSEC) 40 MG capsule  10/23/17   [provider]  oxyCODONE-acetaminophen (PERCOCET) 5-325 MG tablet Take 1 tablet by mouth every 6 (six) hours as needed. 02/20/21   Milton Ferguson, MD  sevelamer (RENAGEL) 800 MG tablet Take 800-1,600 mg by mouth See admin instructions. Take 2 tablets by mouth with meals and 1 with snacks.    [provider]  sevelamer carbonate (RENVELA) 800 MG tablet Take by mouth.    [provider]  simvastatin (ZOCOR) 20 MG tablet Take 20 mg by mouth daily.    [provider]  traMADol (ULTRAM) 50 MG tablet Take by mouth.    [provider]    Physical Exam: Vitals:   03/18/21 0400 03/18/21 0840 03/18/21 1000 03/18/21 1052  BP: (!) 156/54 (!) 133/50 (!) 145/60 117/67  Pulse: 74 69  70  Resp: 17 15 14 15   Temp:      TempSrc:      SpO2: 94% 98%  97%  Weight:      Height:        Constitutional: NAD, calm, comfortable Vitals:   03/18/21 0400 03/18/21 0840 03/18/21 1000 03/18/21 1052  BP: (!) 156/54 (!) 133/50 (!) 145/60 117/67  Pulse: 74 69  70  Resp: 17 15 14 15   Temp:      TempSrc:      SpO2: 94% 98%  97%  Weight:       Height:       Eyes: lids and conjunctivae normal Neck: normal, supple Respiratory: clear to auscultation bilaterally. Normal respiratory effort. No accessory muscle use.  Cardiovascular: Regular rate and rhythm, no murmurs. Abdomen: no tenderness, minimal distention. Bowel sounds positive.  Musculoskeletal:  No edema. Skin: no rashes, lesions, ulcers.  Psychiatric: Flat affect  Labs on Admission: I have personally reviewed following labs and imaging studies  CBC: Recent Labs  Lab 03/18/21 0245  WBC 20.3*  NEUTROABS 18.2*  HGB 9.5*  HCT 30.2*  MCV 101.3*  PLT 921   Basic Metabolic Panel: Recent Labs  Lab 03/18/21 0245  NA 134*  K 3.5  CL 96*  CO2 27  GLUCOSE 118*  BUN 33*  CREATININE 4.05*  CALCIUM 9.3  GFR: Estimated Creatinine Clearance: 11 mL/min (A) (by C-G formula based on SCr of 4.05 mg/dL (H)). Liver Function Tests: Recent Labs  Lab 03/18/21 0245  AST 18  ALT 13  ALKPHOS 73  BILITOT 0.6  PROT 6.0*  ALBUMIN 2.5*   No results for input(s): LIPASE, AMYLASE in the last 168 hours. No results for input(s): AMMONIA in the last 168 hours. Coagulation Profile: Recent Labs  Lab 03/18/21 0245  INR 1.2   Cardiac Enzymes: No results for input(s): CKTOTAL, CKMB, CKMBINDEX, TROPONINI in the last 168 hours. BNP (last 3 results) No results for input(s): PROBNP in the last 8760 hours. HbA1C: No results for input(s): HGBA1C in the last 72 hours. CBG: No results for input(s): GLUCAP in the last 168 hours. Lipid Profile: No results for input(s): CHOL, HDL, LDLCALC, TRIG, CHOLHDL, LDLDIRECT in the last 72 hours. Thyroid Function Tests: No results for input(s): TSH, T4TOTAL, FREET4, T3FREE, THYROIDAB in the last 72 hours. Anemia Panel: No results for input(s): VITAMINB12, FOLATE, FERRITIN, TIBC, IRON, RETICCTPCT in the last 72 hours. Urine analysis: No results found for: COLORURINE, APPEARANCEUR, LABSPEC, [place], GLUCOSEU, HGBUR, BILIRUBINUR, KETONESUR,  PROTEINUR, UROBILINOGEN, NITRITE, LEUKOCYTESUR  Radiological Exams on Admission: CT ABDOMEN PELVIS W CONTRAST  Result Date: 03/18/2021 CLINICAL DATA:  Bloody stools with abdominal pain. EXAM: CT ABDOMEN AND PELVIS WITH CONTRAST TECHNIQUE: Multidetector CT imaging of the abdomen and pelvis was performed using the standard protocol following bolus administration of intravenous contrast. CONTRAST:  190mL OMNIPAQUE IOHEXOL 300 MG/ML  SOLN COMPARISON:  02/20/2021. FINDINGS: Lower chest: Heart is enlarged. Dependent atelectasis noted in the lung bases. Hepatobiliary: Tiny hypodensities in the right liver are stable since prior and also comparing back to 05/09/2020 suggesting benign etiology. Mild intra and extrahepatic biliary duct dilatation again noted with some associated periportal edema today. Common bile duct measures 12 mm diameter, increased from 8 mm on the 05/09/2020 exam. Gallbladder is markedly distended. Pancreas: Mild prominence of the ventral pancreatic duct. Dorsal duct is not well seen in the head of the pancreas but appears nondilated in the body and tail. No discrete pancreatic mass lesion evident. Spleen: No splenomegaly. No focal mass lesion. Adrenals/Urinary Tract: Marked bilateral polycystic kidney disease noted with numerous calcifications/stones, similar to prior. As noted previously, there is fairly marked left-sided hydroureteronephrosis with collecting system and proximal ureter filled with high attenuation material. Left ureter remains dilated down to the level of the iliac crests to a point immediately adjacent to a surgical clip. Bladder is decompressed. Stomach/Bowel: Stomach is decompressed. No small bowel dilatation. Assessment of bowel limited by lack of mesenteric fat. A 5.0 x 4.3 x 3.1 cm collection of gas and debris is identified in the anterior central mesentery of the pelvis (see axial image 60/series 2 and sagittal image 72 of series 6). This shows an irregular rim of  peripheral enhancement different than enhancement seen in bowel wall. Imaging features are very suspicious for mesenteric abscess, also visible on coronal image 47 where there may be a second smaller adjacent component visible on 46/5. This finding is not immediately adjacent to the left colon and etiology is uncertain although small bowel perforation would be a consideration. Several other small circular areas of gas and debris are seen in the lower small bowel mesentery, indeterminate. Vascular/Lymphatic: There is abdominal aortic atherosclerosis without aneurysm. Right external iliac vein stent device noted. Pseudoaneurysm noted in the arterial limb laterally with a second relatively large pseudoaneurysm near the venous and of the graft more medially. There  is no gastrohepatic or hepatoduodenal ligament lymphadenopathy. No retroperitoneal or mesenteric lymphadenopathy. No pelvic sidewall lymphadenopathy. Reproductive: Calcified fibroids are noted in the uterus There is no adnexal mass. Other: Small to moderate volume free fluid noted with diffuse body wall edema. Musculoskeletal: No worrisome lytic or sclerotic osseous abnormality. Advanced degenerative changes noted at L4-5 level. IMPRESSION: 1. 5.0 x 4.3 x 3.1 cm collection of gas and debris in the anterior central mesentery of the pelvis with an irregular rim of peripheral enhancement different than enhancement seen in bowel wall elsewhere. Although assessment is hindered by lack of mesenteric fat and bowel opacification, imaging features are very suspicious for mesenteric abscess. This finding is not immediately adjacent to the left colon and etiology is uncertain although small bowel perforation would be a consideration. 2. Several other small circular areas of gas and debris are seen in the lower small bowel mesentery, indeterminate. 3. Marked bilateral polycystic kidney disease with numerous calcifications/stones, similar to prior. There is fairly marked  left hydroureteronephrosis with high attenuation material in the collecting system and proximal ureter, similar to prior. This may represent blood, infectious debris, or soft tissue/neoplasm. Ureteral obstruction is in the mid ureter at or immediately adjacent to a surgical clip. 4. Right groin dialysis graft with pseudoaneurysm formation. 5. Small to moderate volume free fluid with diffuse body wall edema. 6. Mild intra and extrahepatic biliary duct dilatation with some associated periportal edema. Common bile duct measures 12 mm diameter, increased from 8 mm on the 05/09/2020 exam. Correlation with liver function test may prove helpful. Gallbladder is markedly distended. 7. Aortic Atherosclerosis (ICD10-I70.0). Electronically Signed   By: Misty Stanley M.D.   On: 03/18/2021 10:42    EKG: Independently reviewed. SR 66bpm.  Assessment/Plan Active Problems:   Perforation bowel (HCC)    Mesenteric abscess with possible associated bowel perforation -Appreciate general surgery evaluation -We will need transfer as this is a complicated case and will require IR for drainage as well -Continue on vancomycin and Zosyn as well. -N.p.o. -Avoid IVF given ESRD -Pain management with Dilaudid as needed, continue home pain medications  ESRD on HD TTS -Patient will require nephrology consultation for HD on arrival to [facility]  Chronic anemia -Likely related to CKD -Continue to monitor CBC given active blood loss -Currently near baseline of 9-10  Hypertension -Hold home blood pressure medications given strict n.p.o. status -Labetalol as needed for severe elevations  Dyslipidemia -Plan to hold statin for now   DVT prophylaxis: SCDs Code Status: Full Family Communication: Discussed with son on phone 4/30 Disposition Plan:Transfer to [facility] due to need for IR/GS/Nephrology Consults called:General Surgery, Nephrology Admission status: Inpatient, Tele  Kayde Warehime D Oluwaferanmi Wain DO [facility]  If 7PM-7AM,  please contact night-coverage [email or web address]  03/18/2021, 11:31 AM

## 2021-03-18 NOTE — Progress Notes (Signed)
Pharmacy Antibiotic Note  [name] is a 70 y.o. female admitted on 03/17/2021 with sepsis & abscess.  Pharmacy has been consulted for zosyn and vancomycin dosing.  Plan: Vancomycin 1gm x 1, followed by vancomycin 500mg  qTTHS dialysis  zosyn 3.375gm iv q12h  Height: 5\' 4"  (162.6 cm) Weight: 56.7 kg (124 lb 14.4 oz) IBW/kg (Calculated) : 54.7  Temp (24hrs), Avg:97.6 F (36.4 C), Min:97.6 F (36.4 C), Max:97.6 F (36.4 C)  Recent Labs  Lab 03/18/21 0245 03/18/21 0457  WBC 20.3*  --   CREATININE 4.05*  --   LATICACIDVEN  --  2.0*    Estimated Creatinine Clearance: 11 mL/min (A) (by C-G formula based on SCr of 4.05 mg/dL (H)).    Allergies  Allergen Reactions  . Dilaudid [Hydromorphone] Nausea And Vomiting  . Dilaudid  [Hydromorphone Hcl]   . Tape Rash    Plastic tape    Antimicrobials this admission: 4/30 vancomycin >>  4/30 zosyn >>   Microbiology results: 4/30 BCx: sent 4/30 MRSA PCR: negative  Thank you for allowing pharmacy to be a part of this patient's care.  Donna Christen Jarita Raval 03/18/2021 12:29 PM

## 2021-03-18 NOTE — Consult Note (Signed)
[facility] Consult  Reason for Consult: ? Mesenteric abscess, ? Small bowel perforation  Referring Physician:  Dr. Manuella Ghazi   Chief Complaint    Melena      HPI: [name] is a 70 y.o. female with ESRD on dialysis via a RLE graft, reported CHF and valvular disease, anemia who has had 2+ weeks of lower abdominal pain and distention and black tarry stools. She has a colonoscopy documented in 2012 with pan diverticulitis and hemorrhoids. She had a visit to the ED 4/4 and CT demonstrated no signs of infection or diverticulitis. She returns with continued pain and the tarry stools.   CT today demonstrated a mesenteric abscess that is in the anterior lower abdomen in the mesentery and not directly adjacent to any colon or small bowel and the site of possible perforation is undetermined. Her gallbladder was also very distended with a dilated CBD but no signs of stone. I asked the ED to admit her to hospitalist with plans IR and further work up and [place] to evaluate her RUQ and gallbladder.  She denies any RUQ pain.  She denies any diverticulitis or eating a lot of bony fish etc.   No recent cardiology or ECHO in our system. I found documentation of a ECHO in 2009 with EF 20% and nonischemic cardiomyopathy.    Past Medical History:  Diagnosis Date  . Chronic systolic heart failure ([place])   . Coronary atherosclerosis of native coronary artery   . Diseases of tricuspid valve   . End stage renal disease ([place])    dialysis T,Th & Sat  . Mitral valve insufficiency and aortic valve insufficiency   . Other and unspecified hyperlipidemia   . Secondary cardiomyopathy, unspecified   . Unspecified essential hypertension     Past Surgical History:  Procedure Laterality Date  . A/V SHUNTOGRAM N/A 08/10/2019   Procedure: A/V SHUNTOGRAM;  Surgeon: Algernon Huxley, MD;  Location: [place] [facility];  Service: Cardiovascular;  Laterality: N/A;  . AV FISTULA PLACEMENT Right 2012  . AV FISTULA  PLACEMENT Left 2004  . CATARACT EXTRACTION W/PHACO Left 08/07/2019   Procedure: CATARACT EXTRACTION PHACO AND INTRAOCULAR LENS PLACEMENT LEFT EYE;  Surgeon: [name], MD;  Location: [facility];  Service: Ophthalmology;  Laterality: Left;  left  . CATARACT EXTRACTION W/PHACO Right 08/21/2019   Procedure: CATARACT EXTRACTION PHACO AND INTRAOCULAR LENS PLACEMENT RIGHT EYE (CDE: 5.45);  Surgeon: [name], MD;  Location: [facility];  Service: Ophthalmology;  Laterality: Right;  . INSERTION OF DIALYSIS CATHETER N/A 01/08/2013   Procedure: INSERTION OF DIALYSIS CATHETER;  Surgeon: Angelia Mould, MD;  Location: [place];  Service: Vascular;  Laterality: N/A;  . KIDNEY TRANSPLANT Left 2002   pt. states transplant lasted 2 yrs.  Marland Kitchen PERIPHERAL VASCULAR CATHETERIZATION Right 08/27/2016   Procedure: A/V Shuntogram/Fistulagram;  Surgeon: Algernon Huxley, MD;  Location: [place] [facility];  Service: Cardiovascular;  Laterality: Right;    No family history on file.  Social History   Tobacco Use  . Smoking status: Never Smoker  . Smokeless tobacco: Never Used  Vaping Use  . Vaping Use: Never used  Substance Use Topics  . Alcohol use: No  . Drug use: No    Medications:  I have reviewed the patient's current medications. Prior to Admission: (Not in a hospital admission)  Scheduled: . sodium chloride flush  3 mL Intravenous Q12H  . [START ON 03/21/2021] vancomycin  500 mg Intravenous Q T,Th,Sa-HD  Continuous: . sodium chloride    . sodium chloride    . piperacillin-tazobactam (ZOSYN)  IV    . vancomycin     HYQ:MVHQIO chloride, acetaminophen **OR** acetaminophen, HYDROmorphone (DILAUDID) injection, labetalol, ondansetron **OR** ondansetron (ZOFRAN) IV, sodium chloride flush  Allergies  Allergen Reactions  . Dilaudid [Hydromorphone] Nausea And Vomiting  . Dilaudid  [Hydromorphone Hcl]   . Tape Rash    Plastic tape     ROS:  A comprehensive review of systems was negative except for:  Gastrointestinal: positive for abdominal pain and black tarry stools  Blood pressure 117/67, pulse 70, temperature 97.6 F (36.4 C), temperature source Oral, resp. rate 15, height 5\' 4"  (1.626 m), weight 56.7 kg, SpO2 97 %. Physical Exam Vitals reviewed.  Constitutional:      Appearance: Normal appearance.  HENT:     Head: Normocephalic.     Nose: Nose normal.  Eyes:     Extraocular Movements: Extraocular movements intact.  Cardiovascular:     Rate and Rhythm: Normal rate.  Pulmonary:     Effort: Pulmonary effort is normal.  Abdominal:     General: There is no distension.     Palpations: Abdomen is soft.     Tenderness: There is abdominal tenderness in the suprapubic area and left lower quadrant.     Hernia: A hernia is present. Hernia is present in the umbilical area.     Comments: No rebound or guarding, tender to deep palpation   Musculoskeletal:        General: No swelling.     Comments: RLE AVG in place  Skin:    General: Skin is warm.  Neurological:     General: No focal deficit present.     Mental Status: She is alert.  Psychiatric:        Mood and Affect: Mood normal.        Behavior: Behavior normal.     Results: Results for orders placed or performed during the hospital encounter of 03/17/21 (from the past 48 hour(s))  CBC with Differential/Platelet     Status: Abnormal   Collection Time: 03/18/21  2:45 AM  Result Value Ref Range   WBC 20.3 (H) 4.0 - 10.5 K/uL   RBC 2.98 (L) 3.87 - 5.11 MIL/uL   Hemoglobin 9.5 (L) 12.0 - 15.0 g/dL   HCT 30.2 (L) 36.0 - 46.0 %   MCV 101.3 (H) 80.0 - 100.0 fL   MCH 31.9 26.0 - 34.0 pg   MCHC 31.5 30.0 - 36.0 g/dL   RDW 13.2 11.5 - 15.5 %   Platelets 218 150 - 400 K/uL   nRBC 0.0 0.0 - 0.2 %   Neutrophils Relative % 90 %   Neutro Abs 18.2 (H) 1.7 - 7.7 K/uL   Lymphocytes Relative 5 %   Lymphs Abs 1.1 0.7 - 4.0 K/uL   Monocytes Relative 4 %   Monocytes Absolute 0.8 0.1 - 1.0 K/uL   Eosinophils Relative 0 %    Eosinophils Absolute 0.0 0.0 - 0.5 K/uL   Basophils Relative 0 %   Basophils Absolute 0.1 0.0 - 0.1 K/uL   Immature Granulocytes 1 %   Abs Immature Granulocytes 0.14 (H) 0.00 - 0.07 K/uL    Comment: Performed at [facility], [address], [place], [place] [address]  Comprehensive metabolic panel     Status: Abnormal   Collection Time: 03/18/21  2:45 AM  Result Value Ref Range   Sodium 134 (L) 135 - 145 mmol/L  Potassium 3.5 3.5 - 5.1 mmol/L   Chloride 96 (L) 98 - 111 mmol/L   CO2 27 22 - 32 mmol/L   Glucose, Bld 118 (H) 70 - 99 mg/dL    Comment: Glucose reference range applies only to samples taken after fasting for at least 8 hours.   BUN 33 (H) 8 - 23 mg/dL   Creatinine, Ser 4.05 (H) 0.44 - 1.00 mg/dL   Calcium 9.3 8.9 - 10.3 mg/dL   Total Protein 6.0 (L) 6.5 - 8.1 g/dL   Albumin 2.5 (L) 3.5 - 5.0 g/dL   AST 18 15 - 41 U/L   ALT 13 0 - 44 U/L   Alkaline Phosphatase 73 38 - 126 U/L   Total Bilirubin 0.6 0.3 - 1.2 mg/dL   GFR, Estimated 11 (L) >60 mL/min    Comment: (NOTE) Calculated using the CKD-EPI Creatinine Equation (2021)    Anion gap 11 5 - 15    Comment: Performed at [facility], [address], [place], [place] [address]  Protime-INR     Status: Abnormal   Collection Time: 03/18/21  2:45 AM  Result Value Ref Range   Prothrombin Time 15.4 (H) 11.4 - 15.2 seconds   INR 1.2 0.8 - 1.2    Comment: (NOTE) INR goal varies based on device and disease states. Performed at [facility], [address], [place], [place] [address]   Prepare RBC (crossmatch)     Status: None   Collection Time: 03/18/21  2:45 AM  Result Value Ref Range   Order Confirmation      ORDER PROCESSED BY BLOOD BANK Performed at [facility], [address] [place], [place] [address]   POC occult blood, ED     Status: Abnormal   Collection Time: 03/18/21  4:11 AM  Result Value Ref Range   Fecal Occult Bld POSITIVE (A) NEGATIVE  ABO/Rh     Status: None   Collection Time: 03/18/21  4:52 AM   Result Value Ref Range   ABO/RH(D)      O POS Performed at [facility], [address] [place], [place] [address]   Lactic acid, plasma     Status: Abnormal   Collection Time: 03/18/21  4:57 AM  Result Value Ref Range   Lactic Acid, Venous 2.0 (HH) 0.5 - 1.9 mmol/L    Comment: CRITICAL RESULT CALLED TO, READ BACK BY AND VERIFIED WITH: SAPPELT J. @ 0601 ON [id number] BY HENDERSON L Performed at [facility], [address] [place], [place] [address]   Type and screen     Status: None (Preliminary result)   Collection Time: 03/18/21  5:49 AM  Result Value Ref Range   ABO/RH(D) O POS    Antibody Screen POS    Sample Expiration 03/21/2021,2359    Antibody Identification ANTI K    PT AG Type NEGATIVE FOR KELL ANTIGEN NEGATIVE FOR E ANTIGEN    Unit Number [id number]    Blood Component Type RBC LR PHER1    Unit division 00    Status of Unit IN-TRANSIT    Donor AG Type NEGATIVE FOR KELL ANTIGEN NEGATIVE FOR E ANTIGEN    Transfusion Status OK TO TRANSFUSE    Crossmatch Result COMPATIBLE    Unit Number [id number]    Blood Component Type RED CELLS,LR    Unit division 00    Status of Unit IN-TRANSIT    Donor AG Type NEGATIVE FOR KELL ANTIGEN NEGATIVE FOR E ANTIGEN    Transfusion Status OK  TO TRANSFUSE    Crossmatch Result COMPATIBLE   Resp Panel by RT-PCR (Flu A&B, Covid) Nasopharyngeal Swab     Status: None   Collection Time: 03/18/21  8:15 AM   Specimen: Nasopharyngeal Swab; Nasopharyngeal(NP) swabs in vial transport medium  Result Value Ref Range   SARS Coronavirus 2 by RT PCR NEGATIVE NEGATIVE    Comment: (NOTE) SARS-CoV-2 target nucleic acids are NOT DETECTED.  The SARS-CoV-2 RNA is generally detectable in upper respiratory specimens during the acute phase of infection. The lowest concentration of SARS-CoV-2 viral copies this assay can detect is 138 copies/mL. A negative result does not preclude SARS-Cov-2 infection and should not be used as the sole basis for treatment  or other patient management decisions. A negative result may occur with  improper specimen collection/handling, submission of specimen other than nasopharyngeal swab, presence of viral mutation(s) within the areas targeted by this assay, and inadequate number of viral copies(<138 copies/mL). A negative result must be combined with clinical observations, patient history, and epidemiological information. The expected result is Negative.  Fact Sheet for Patients:  EntrepreneurPulse.com.au  Fact Sheet for Healthcare Providers:  IncredibleEmployment.be  This test is no t yet approved or cleared by the [place] FDA and  has been authorized for detection and/or diagnosis of SARS-CoV-2 by FDA under an Emergency Use Authorization (EUA). This EUA will remain  in effect (meaning this test can be used) for the duration of the COVID-19 declaration under Section 564(b)(1) of the Act, 21 U.S.C.section 360bbb-3(b)(1), unless the authorization is terminated  or revoked sooner.       Influenza A by PCR NEGATIVE NEGATIVE   Influenza B by PCR NEGATIVE NEGATIVE    Comment: (NOTE) The Xpert Xpress SARS-CoV-2/FLU/RSV plus assay is intended as an aid in the diagnosis of influenza from Nasopharyngeal swab specimens and should not be used as a sole basis for treatment. Nasal washings and aspirates are unacceptable for Xpert Xpress SARS-CoV-2/FLU/RSV testing.  Fact Sheet for Patients: EntrepreneurPulse.com.au  Fact Sheet for Healthcare Providers: IncredibleEmployment.be  This test is not yet approved or cleared by the [place] FDA and has been authorized for detection and/or diagnosis of SARS-CoV-2 by FDA under an Emergency Use Authorization (EUA). This EUA will remain in effect (meaning this test can be used) for the duration of the COVID-19 declaration under Section 564(b)(1) of the Act, 21 U.S.C. section  360bbb-3(b)(1), unless the authorization is terminated or revoked.  Performed at [facility], [address], [place], [place] [address]    Personally reviewed- abscess cavity with gas and fluid in the lower central abdomen with anterior approach to the abdominal wall, no obvious thickened or inflamed bowel, Very distended gallbladder but without obvious fluid or thickening, CBD enlarged  CT ABDOMEN PELVIS W CONTRAST  Result Date: 03/18/2021 CLINICAL DATA:  Bloody stools with abdominal pain. EXAM: CT ABDOMEN AND PELVIS WITH CONTRAST TECHNIQUE: Multidetector CT imaging of the abdomen and pelvis was performed using the standard protocol following bolus administration of intravenous contrast. CONTRAST:  111mL OMNIPAQUE IOHEXOL 300 MG/ML  SOLN COMPARISON:  02/20/2021. FINDINGS: Lower chest: Heart is enlarged. Dependent atelectasis noted in the lung bases. Hepatobiliary: Tiny hypodensities in the right liver are stable since prior and also comparing back to 05/09/2020 suggesting benign etiology. Mild intra and extrahepatic biliary duct dilatation again noted with some associated periportal edema today. Common bile duct measures 12 mm diameter, increased from 8 mm on the 05/09/2020 exam. Gallbladder is markedly distended. Pancreas: Mild prominence of the ventral  pancreatic duct. Dorsal duct is not well seen in the head of the pancreas but appears nondilated in the body and tail. No discrete pancreatic mass lesion evident. Spleen: No splenomegaly. No focal mass lesion. Adrenals/Urinary Tract: Marked bilateral polycystic kidney disease noted with numerous calcifications/stones, similar to prior. As noted previously, there is fairly marked left-sided hydroureteronephrosis with collecting system and proximal ureter filled with high attenuation material. Left ureter remains dilated down to the level of the iliac crests to a point immediately adjacent to a surgical clip. Bladder is decompressed. Stomach/Bowel: Stomach  is decompressed. No small bowel dilatation. Assessment of bowel limited by lack of mesenteric fat. A 5.0 x 4.3 x 3.1 cm collection of gas and debris is identified in the anterior central mesentery of the pelvis (see axial image 60/series 2 and sagittal image 72 of series 6). This shows an irregular rim of peripheral enhancement different than enhancement seen in bowel wall. Imaging features are very suspicious for mesenteric abscess, also visible on coronal image 47 where there may be a second smaller adjacent component visible on 46/5. This finding is not immediately adjacent to the left colon and etiology is uncertain although small bowel perforation would be a consideration. Several other small circular areas of gas and debris are seen in the lower small bowel mesentery, indeterminate. Vascular/Lymphatic: There is abdominal aortic atherosclerosis without aneurysm. Right external iliac vein stent device noted. Pseudoaneurysm noted in the arterial limb laterally with a second relatively large pseudoaneurysm near the venous and of the graft more medially. There is no gastrohepatic or hepatoduodenal ligament lymphadenopathy. No retroperitoneal or mesenteric lymphadenopathy. No pelvic sidewall lymphadenopathy. Reproductive: Calcified fibroids are noted in the uterus There is no adnexal mass. Other: Small to moderate volume free fluid noted with diffuse body wall edema. Musculoskeletal: No worrisome lytic or sclerotic osseous abnormality. Advanced degenerative changes noted at L4-5 level. IMPRESSION: 1. 5.0 x 4.3 x 3.1 cm collection of gas and debris in the anterior central mesentery of the pelvis with an irregular rim of peripheral enhancement different than enhancement seen in bowel wall elsewhere. Although assessment is hindered by lack of mesenteric fat and bowel opacification, imaging features are very suspicious for mesenteric abscess. This finding is not immediately adjacent to the left colon and etiology is  uncertain although small bowel perforation would be a consideration. 2. Several other small circular areas of gas and debris are seen in the lower small bowel mesentery, indeterminate. 3. Marked bilateral polycystic kidney disease with numerous calcifications/stones, similar to prior. There is fairly marked left hydroureteronephrosis with high attenuation material in the collecting system and proximal ureter, similar to prior. This may represent blood, infectious debris, or soft tissue/neoplasm. Ureteral obstruction is in the mid ureter at or immediately adjacent to a surgical clip. 4. Right groin dialysis graft with pseudoaneurysm formation. 5. Small to moderate volume free fluid with diffuse body wall edema. 6. Mild intra and extrahepatic biliary duct dilatation with some associated periportal edema. Common bile duct measures 12 mm diameter, increased from 8 mm on the 05/09/2020 exam. Correlation with liver function test may prove helpful. Gallbladder is markedly distended. 7. Aortic Atherosclerosis (ICD10-I70.0). Electronically Signed   By: Misty Stanley M.D.   On: 03/18/2021 10:42     Assessment & Plan:  [name] is a 70 y.o. female with a mesenteric abscess of unknown etiology. She will likely need IR given leukocytosis of 20. She has multiple systemic issues including ESRD, CHF per report but no ECHO in our  system but documentation of ECHO in 2009 with 20% EF and nonischemic cardiomyopathy.  I am unsure where this perforation has come from and will need additional work up. Last colonoscopy in 2012 with pan diverticulitis by Dr. Laural Golden.  May need CT with po contrast versus CT Enterography to better delineate any source, would discuss further with radiology. Stable and locally tender, no urgent surgical intervention indicated. Gallbladder distention and [place] being done to document.  No obvious gallstone ileus or stone that would have caused a perforation on prior CT or this CT.   IV antibiotics NPO  until determine source/ etiology of perforation Repeat imaging, CT with po contrast versus enterography, would discuss further with radiology  IR for drainage of abscess ECHO / cardiology given the reported history   Updated Dr. Donne Hazel who is aware of the patient and will see as needed.  Discussed with Dr. Manuella Ghazi.   All questions were answered to the satisfaction of the patient.   Virl Cagey 03/18/2021, 11:19 AM

## 2021-03-18 NOTE — ED Notes (Signed)
Rectal exam chaperoned with EDP, hemoccult positive +. Pt c/o pain, EDP aware. Will attempt larger gauge PIV for radiology, orders will be placed for pain medicine by EDP.

## 2021-03-18 NOTE — ED Provider Notes (Signed)
Patient CARE signed out to follow-up CT scan results and admit.  Briefly patient presents for recurrent visit for abdominal pain and melena.  On exam patient has mid lower abdominal discomfort no guarding, general weakness on exam, no gross active bleeding.  Difficult IV, ultrasound-guided by myself.  Patient's had increased white blood cell count and decreased hemoglobin since last visit.  CT scan results pending.  Ultrasound ED Peripheral IV (Provider)  Date/Time: 03/18/2021 8:23 AM Performed by: Elnora Morrison, MD Authorized by: Elnora Morrison, MD   Procedure details:    Indications: multiple failed IV attempts     Skin Prep: chlorhexidine gluconate     Location:  Left AC   Angiocath:  20 G   Bedside Ultrasound Guided: Yes     Images: archived     Patient tolerated procedure without complications: Yes     Dressing applied: Yes    .Critical Care Performed by: Elnora Morrison, MD Authorized by: Elnora Morrison, MD   Critical care provider statement:    Critical care time (minutes):  40   Critical care start time:  03/18/2021 10:00 AM   Critical care end time:  03/18/2021 10:40 AM   Critical care time was exclusive of:  Separately billable procedures and treating other patients and teaching time   Critical care was time spent personally by me on the following activities:  Discussions with consultants, evaluation of patient's response to treatment, examination of patient, ordering and performing treatments and interventions, ordering and review of laboratory studies, ordering and review of radiographic studies, pulse oximetry, re-evaluation of patient's condition and review of old charts Comments:     Abdominal abscess and possible perforations   CT scan results reviewed concerning for 5 cm abscess in the mesentery region, possible perforation.  Pain meds were given.  Surgery paged and IV antibiotics obtained.  IV infiltrated, plan for repeat IV placement. Discussed admission/ transfer  with hospitalist.      Elnora Morrison, MD 03/18/21 1128

## 2021-03-18 NOTE — Progress Notes (Signed)
Patient ID: [name], female   DOB: [birth year], 70 y.o.   MRN: [id number] Agree with Dr Constance Haw plan, we will follow with you and await possible drainage

## 2021-03-18 NOTE — ED Notes (Signed)
Pt in CT. Unable to medicate at this time for pain.

## 2021-03-18 NOTE — Progress Notes (Signed)
[name] is a 70 y.o. female patient admitted. Awake, alert - oriented  X 4 - no acute distress noted.  VSS - Blood pressure (!) 160/49, pulse 69, temperature 98.3 F (36.8 C), temperature source Oral, resp. rate 17, height 5\' 4"  (1.626 m), weight 54.7 kg, SpO2 100 %.    IV team to place IV access. Denies pain at this time  Orientation to room, and floor completed.  Admission INP armband ID verified with patient/family, and in place.   SR up x 2, fall assessment complete, with patient and family able to verbalize understanding of risk associated with falls, and verbalized understanding to call nsg before up out of bed.  Call light within reach, patient able to voice, and demonstrate understanding. No evidence of skin break down noted on exam.  Admission nurse notified of admission.     Will cont to eval and treat per MD orders.  Hezzie Bump, RN 03/18/2021 9:24 PM

## 2021-03-18 NOTE — ED Notes (Signed)
Date and time results received: 03/18/21 0600   Test: LACTIC Critical Value: 2.0  Name of Provider Notified: Betsey Holiday, MD

## 2021-03-18 NOTE — ED Notes (Signed)
Stephanie from lab called to update that patient has antibodies in blood, EDP made aware, no orders to transfuse at this time.

## 2021-03-18 NOTE — ED Notes (Signed)
Pt very difficult to obtain peripheral IV access. Ultrasound guided IV placed earlier and infiltrated. [facility] willing to transport patient without IV access. MD made aware.

## 2021-03-18 NOTE — Consult Note (Signed)
[facility] Nephrology Consult Note: Reason for Consult: To manage dialysis and dialysis related needs Referring Physician: Dr Manuella Ghazi, Pratik  HPI:  [name] is an 70 y.o. female with history of hypertension, HLD, anemia, acid reflux, ESRD on HD for last 23 years, TTS schedule at [facility], has right thigh graft transferred from [facility] for mesenteric abscess and possible bowel perforation, seen as a consultation for the management of ESRD. Patient presented to ER with a complaint of worsening abdominal pain, distention associated with GI bleed.  The symptoms was ongoing for about 2 weeks.  No fever, chills, nausea or vomiting.  She was found to have leukocytosis with WBC of 20 K, hemoglobin 9.5.  In the ER she received vancomycin and IV Zosyn.  CT scan of abdomen pelvis demonstrated large collection of fluid consistent with mesenteric abscess and possible a small bowel perforation.  General surgery was consulted.  Patient was transferred here for possible IR drainage and surgery consult.  She is currently NPO. Patient gets dialysis TTS schedule, the last dialysis appointment on Thursday for 3.5 hours.  Patient does not remember details however reports that she does not get heparin. She is currently lying on bed comfortable.  She denies shortness of breath, chest pain, nausea, vomiting, headache or dizziness.  Abdomen pain is due to the pain medication.  The labs showed potassium of 3.5, BUN 33, lactic acid 2.  The son is at bedside.   Past Medical History:  Diagnosis Date  . Chronic systolic heart failure ([place])   . Coronary atherosclerosis of native coronary artery   . Diseases of tricuspid valve   . End stage renal disease ([place])    dialysis T,Th & Sat  . Mitral valve insufficiency and aortic valve insufficiency   . Other and unspecified hyperlipidemia   . Secondary cardiomyopathy, unspecified   . Unspecified essential hypertension     Past Surgical History:   Procedure Laterality Date  . A/V SHUNTOGRAM N/A 08/10/2019   Procedure: A/V SHUNTOGRAM;  Surgeon: Algernon Huxley, MD;  Location: [place] [facility];  Service: Cardiovascular;  Laterality: N/A;  . AV FISTULA PLACEMENT Right 2012  . AV FISTULA PLACEMENT Left 2004  . CATARACT EXTRACTION W/PHACO Left 08/07/2019   Procedure: CATARACT EXTRACTION PHACO AND INTRAOCULAR LENS PLACEMENT LEFT EYE;  Surgeon: [name], MD;  Location: [facility];  Service: Ophthalmology;  Laterality: Left;  left  . CATARACT EXTRACTION W/PHACO Right 08/21/2019   Procedure: CATARACT EXTRACTION PHACO AND INTRAOCULAR LENS PLACEMENT RIGHT EYE (CDE: 5.45);  Surgeon: [name], MD;  Location: [facility];  Service: Ophthalmology;  Laterality: Right;  . INSERTION OF DIALYSIS CATHETER N/A 01/08/2013   Procedure: INSERTION OF DIALYSIS CATHETER;  Surgeon: Angelia Mould, MD;  Location: [place];  Service: Vascular;  Laterality: N/A;  . KIDNEY TRANSPLANT Left 2002   pt. states transplant lasted 2 yrs.  Marland Kitchen PERIPHERAL VASCULAR CATHETERIZATION Right 08/27/2016   Procedure: A/V Shuntogram/Fistulagram;  Surgeon: Algernon Huxley, MD;  Location: [place] [facility];  Service: Cardiovascular;  Laterality: Right;    No family history on file.  Social History:  reports that she has never smoked. She has never used smokeless tobacco. She reports that she does not drink alcohol and does not use drugs.  Allergies:  Allergies  Allergen Reactions  . Dilaudid [Hydromorphone] Nausea And Vomiting  . Dilaudid  [Hydromorphone Hcl]   . Tape Rash    Plastic tape    Medications: I have  reviewed the patient's current medications.   Results for orders placed or performed during the hospital encounter of 03/17/21 (from the past 48 hour(s))  CBC with Differential/Platelet     Status: Abnormal   Collection Time: 03/18/21  2:45 AM  Result Value Ref Range   WBC 20.3 (H) 4.0 - 10.5 K/uL   RBC 2.98 (L) 3.87 - 5.11 MIL/uL   Hemoglobin 9.5 (L) 12.0 - 15.0  g/dL   HCT 30.2 (L) 36.0 - 46.0 %   MCV 101.3 (H) 80.0 - 100.0 fL   MCH 31.9 26.0 - 34.0 pg   MCHC 31.5 30.0 - 36.0 g/dL   RDW 13.2 11.5 - 15.5 %   Platelets 218 150 - 400 K/uL   nRBC 0.0 0.0 - 0.2 %   Neutrophils Relative % 90 %   Neutro Abs 18.2 (H) 1.7 - 7.7 K/uL   Lymphocytes Relative 5 %   Lymphs Abs 1.1 0.7 - 4.0 K/uL   Monocytes Relative 4 %   Monocytes Absolute 0.8 0.1 - 1.0 K/uL   Eosinophils Relative 0 %   Eosinophils Absolute 0.0 0.0 - 0.5 K/uL   Basophils Relative 0 %   Basophils Absolute 0.1 0.0 - 0.1 K/uL   Immature Granulocytes 1 %   Abs Immature Granulocytes 0.14 (H) 0.00 - 0.07 K/uL    Comment: Performed at [facility], [address] [place], [place] [address]  Comprehensive metabolic panel     Status: Abnormal   Collection Time: 03/18/21  2:45 AM  Result Value Ref Range   Sodium 134 (L) 135 - 145 mmol/L   Potassium 3.5 3.5 - 5.1 mmol/L   Chloride 96 (L) 98 - 111 mmol/L   CO2 27 22 - 32 mmol/L   Glucose, Bld 118 (H) 70 - 99 mg/dL    Comment: Glucose reference range applies only to samples taken after fasting for at least 8 hours.   BUN 33 (H) 8 - 23 mg/dL   Creatinine, Ser 4.05 (H) 0.44 - 1.00 mg/dL   Calcium 9.3 8.9 - 10.3 mg/dL   Total Protein 6.0 (L) 6.5 - 8.1 g/dL   Albumin 2.5 (L) 3.5 - 5.0 g/dL   AST 18 15 - 41 U/L   ALT 13 0 - 44 U/L   Alkaline Phosphatase 73 38 - 126 U/L   Total Bilirubin 0.6 0.3 - 1.2 mg/dL   GFR, Estimated 11 (L) >60 mL/min    Comment: (NOTE) Calculated using the CKD-EPI Creatinine Equation (2021)    Anion gap 11 5 - 15    Comment: Performed at [facility], [address], [place], [place] [address]  Protime-INR     Status: Abnormal   Collection Time: 03/18/21  2:45 AM  Result Value Ref Range   Prothrombin Time 15.4 (H) 11.4 - 15.2 seconds   INR 1.2 0.8 - 1.2    Comment: (NOTE) INR goal varies based on device and disease states. Performed at [facility], [address] [place], [place] [address]   Prepare RBC  (crossmatch)     Status: None   Collection Time: 03/18/21  2:45 AM  Result Value Ref Range   Order Confirmation      ORDER PROCESSED BY BLOOD BANK Performed at [facility], [address], [place], [place] [address]   POC occult blood, ED     Status: Abnormal   Collection Time: 03/18/21  4:11 AM  Result Value Ref Range   Fecal Occult Bld POSITIVE (A) NEGATIVE  ABO/Rh  Status: None   Collection Time: 03/18/21  4:52 AM  Result Value Ref Range   ABO/RH(D)      O POS Performed at [facility], [address], [place], [place] [address]   Lactic acid, plasma     Status: Abnormal   Collection Time: 03/18/21  4:57 AM  Result Value Ref Range   Lactic Acid, Venous 2.0 (HH) 0.5 - 1.9 mmol/L    Comment: CRITICAL RESULT CALLED TO, READ BACK BY AND VERIFIED WITH: SAPPELT J. @ 0601 ON [id number] BY HENDERSON L Performed at [facility], [address], [place], [place] [address]   Type and screen     Status: None (Preliminary result)   Collection Time: 03/18/21  5:49 AM  Result Value Ref Range   ABO/RH(D) O POS    Antibody Screen POS    Sample Expiration 03/21/2021,2359    Antibody Identification ANTI K    PT AG Type NEGATIVE FOR KELL ANTIGEN NEGATIVE FOR E ANTIGEN    Unit Number [id number]    Blood Component Type RBC LR PHER1    Unit division 00    Status of Unit ALLOCATED    Donor AG Type NEGATIVE FOR KELL ANTIGEN NEGATIVE FOR E ANTIGEN    Transfusion Status OK TO TRANSFUSE    Crossmatch Result COMPATIBLE    Unit Number      [id number] Performed at [facility], [place] [address] [place], [place] [address]    Blood Component Type      RED CELLS,LR Performed at [place] [address] [place], [place] [address]    Unit division      00 Performed at [facility], [place] [address] [place], [place] [address]    Status of Unit      ALLOCATED Performed at [facility], [address] [place], [place] [address]    Donor AG Type NEGATIVE FOR KELL ANTIGEN  NEGATIVE FOR E ANTIGEN    Transfusion Status OK TO TRANSFUSE    Crossmatch Result COMPATIBLE   Resp Panel by RT-PCR (Flu A&B, Covid) Nasopharyngeal Swab     Status: None   Collection Time: 03/18/21  8:15 AM   Specimen: Nasopharyngeal Swab; Nasopharyngeal(NP) swabs in vial transport medium  Result Value Ref Range   SARS Coronavirus 2 by RT PCR NEGATIVE NEGATIVE    Comment: (NOTE) SARS-CoV-2 target nucleic acids are NOT DETECTED.  The SARS-CoV-2 RNA is generally detectable in upper respiratory specimens during the acute phase of infection. The lowest concentration of SARS-CoV-2 viral copies this assay can detect is 138 copies/mL. A negative result does not preclude SARS-Cov-2 infection and should not be used as the sole basis for treatment or other patient management decisions. A negative result may occur with  improper specimen collection/handling, submission of specimen other than nasopharyngeal swab, presence of viral mutation(s) within the areas targeted by this assay, and inadequate number of viral copies(<138 copies/mL). A negative result must be combined with clinical observations, patient history, and epidemiological information. The expected result is Negative.  Fact Sheet for Patients:  EntrepreneurPulse.com.au  Fact Sheet for Healthcare Providers:  IncredibleEmployment.be  This test is no t yet approved or cleared by the [place] FDA and  has been authorized for detection and/or diagnosis of SARS-CoV-2 by FDA under an Emergency Use Authorization (EUA). This EUA will remain  in effect (meaning this test can be used) for the duration of the COVID-19 declaration under Section 564(b)(1) of the Act, 21 U.S.C.section  360bbb-3(b)(1), unless the authorization is terminated  or revoked sooner.       Influenza A by PCR NEGATIVE NEGATIVE   Influenza B by PCR NEGATIVE NEGATIVE    Comment: (NOTE) The Xpert Xpress SARS-CoV-2/FLU/RSV plus  assay is intended as an aid in the diagnosis of influenza from Nasopharyngeal swab specimens and should not be used as a sole basis for treatment. Nasal washings and aspirates are unacceptable for Xpert Xpress SARS-CoV-2/FLU/RSV testing.  Fact Sheet for Patients: EntrepreneurPulse.com.au  Fact Sheet for Healthcare Providers: IncredibleEmployment.be  This test is not yet approved or cleared by the [place] FDA and has been authorized for detection and/or diagnosis of SARS-CoV-2 by FDA under an Emergency Use Authorization (EUA). This EUA will remain in effect (meaning this test can be used) for the duration of the COVID-19 declaration under Section 564(b)(1) of the Act, 21 U.S.C. section 360bbb-3(b)(1), unless the authorization is terminated or revoked.  Performed at [facility], [address], [place], [place] [address]     CT ABDOMEN PELVIS W CONTRAST  Result Date: 03/18/2021 CLINICAL DATA:  Bloody stools with abdominal pain. EXAM: CT ABDOMEN AND PELVIS WITH CONTRAST TECHNIQUE: Multidetector CT imaging of the abdomen and pelvis was performed using the standard protocol following bolus administration of intravenous contrast. CONTRAST:  183mL OMNIPAQUE IOHEXOL 300 MG/ML  SOLN COMPARISON:  02/20/2021. FINDINGS: Lower chest: Heart is enlarged. Dependent atelectasis noted in the lung bases. Hepatobiliary: Tiny hypodensities in the right liver are stable since prior and also comparing back to 05/09/2020 suggesting benign etiology. Mild intra and extrahepatic biliary duct dilatation again noted with some associated periportal edema today. Common bile duct measures 12 mm diameter, increased from 8 mm on the 05/09/2020 exam. Gallbladder is markedly distended. Pancreas: Mild prominence of the ventral pancreatic duct. Dorsal duct is not well seen in the head of the pancreas but appears nondilated in the body and tail. No discrete pancreatic mass lesion evident.  Spleen: No splenomegaly. No focal mass lesion. Adrenals/Urinary Tract: Marked bilateral polycystic kidney disease noted with numerous calcifications/stones, similar to prior. As noted previously, there is fairly marked left-sided hydroureteronephrosis with collecting system and proximal ureter filled with high attenuation material. Left ureter remains dilated down to the level of the iliac crests to a point immediately adjacent to a surgical clip. Bladder is decompressed. Stomach/Bowel: Stomach is decompressed. No small bowel dilatation. Assessment of bowel limited by lack of mesenteric fat. A 5.0 x 4.3 x 3.1 cm collection of gas and debris is identified in the anterior central mesentery of the pelvis (see axial image 60/series 2 and sagittal image 72 of series 6). This shows an irregular rim of peripheral enhancement different than enhancement seen in bowel wall. Imaging features are very suspicious for mesenteric abscess, also visible on coronal image 47 where there may be a second smaller adjacent component visible on 46/5. This finding is not immediately adjacent to the left colon and etiology is uncertain although small bowel perforation would be a consideration. Several other small circular areas of gas and debris are seen in the lower small bowel mesentery, indeterminate. Vascular/Lymphatic: There is abdominal aortic atherosclerosis without aneurysm. Right external iliac vein stent device noted. Pseudoaneurysm noted in the arterial limb laterally with a second relatively large pseudoaneurysm near the venous and of the graft more medially. There is no gastrohepatic or hepatoduodenal ligament lymphadenopathy. No retroperitoneal or mesenteric lymphadenopathy. No pelvic sidewall lymphadenopathy. Reproductive: Calcified fibroids are noted in the uterus There is no adnexal mass. Other: Small  to moderate volume free fluid noted with diffuse body wall edema. Musculoskeletal: No worrisome lytic or sclerotic osseous  abnormality. Advanced degenerative changes noted at L4-5 level. IMPRESSION: 1. 5.0 x 4.3 x 3.1 cm collection of gas and debris in the anterior central mesentery of the pelvis with an irregular rim of peripheral enhancement different than enhancement seen in bowel wall elsewhere. Although assessment is hindered by lack of mesenteric fat and bowel opacification, imaging features are very suspicious for mesenteric abscess. This finding is not immediately adjacent to the left colon and etiology is uncertain although small bowel perforation would be a consideration. 2. Several other small circular areas of gas and debris are seen in the lower small bowel mesentery, indeterminate. 3. Marked bilateral polycystic kidney disease with numerous calcifications/stones, similar to prior. There is fairly marked left hydroureteronephrosis with high attenuation material in the collecting system and proximal ureter, similar to prior. This may represent blood, infectious debris, or soft tissue/neoplasm. Ureteral obstruction is in the mid ureter at or immediately adjacent to a surgical clip. 4. Right groin dialysis graft with pseudoaneurysm formation. 5. Small to moderate volume free fluid with diffuse body wall edema. 6. Mild intra and extrahepatic biliary duct dilatation with some associated periportal edema. Common bile duct measures 12 mm diameter, increased from 8 mm on the 05/09/2020 exam. Correlation with liver function test may prove helpful. Gallbladder is markedly distended. 7. Aortic Atherosclerosis (ICD10-I70.0). Electronically Signed   By: Misty Stanley M.D.   On: 03/18/2021 10:42   US Abdomen Limited  Result Date: 03/18/2021 CLINICAL DATA:  Periumbilical pain for 6 weeks EXAM: ULTRASOUND ABDOMEN LIMITED RIGHT UPPER QUADRANT COMPARISON:  CT abdomen 05/09/2020, 02/20/2021, 03/19/2019 FINDINGS: Gallbladder: Distended gallbladder. No cholelithiasis. Tumefactive sludge within the gallbladder. In negative sonographic Murphy  sign. Mild gallbladder wall thickening measuring up to 4 mm. Common bile duct: Diameter: 12 mm.  No choledocholithiasis. Liver: No focal lesion identified. Increased hepatic parenchymal echogenicity. Portal vein is patent on color Doppler imaging with normal direction of blood flow towards the liver. Other: Polycystic right kidney. Small right pleural effusion. Small volume ascites. IMPRESSION: 1. Hydropic gallbladder with tumefactive sludge. No definite sonographic findings to suggest acute cholecystitis. 2. Dilated common bile duct of uncertain etiology. Electronically Signed   By: Kathreen Devoid   On: 03/18/2021 12:50    ROS: As per H&P.  Rest of the systems reviewed and negative. Blood pressure (!) 148/48, pulse 63, temperature 98.5 F (36.9 C), temperature source Oral, resp. rate 16, height 5\' 4"  (1.626 m), weight 56.7 kg, SpO2 99 %. Gen: NAD, comfortable Respiratory: Clear bilateral, no wheezing or crackle Cardiovascular: Regular rate rhythm S1-S2 normal, no rubs GI: Abdomen soft,  nondistended Extremities, no cyanosis or clubbing, no edema Skin: No rash or ulcer Neurology: Alert, awake, following commands, oriented Dialysis Access: Right thigh AV graft has small area of aneurysmal dilatation.  Good thrill and bruit present.  Assessment/Plan:  #Mesenteric abscess with possible bowel perforation: Transferred from [facility] for this finding, already seen by general surgeon.  She is currently n.p.o. and on broad-spectrum antibiotics.  Further plan per surgery team.  # ESRD TTS at [facility]: Plan for dialysis today without ultrafiltration.  Volume status and electrolytes acceptable.  Use right thigh graft for the access.  No heparin.  Need outpatient treatment record.  # Hypertension: Blood pressure and volume status acceptable.  Monitor BP.  # Anemia of ESRD: Hemoglobin below goal with leukocytosis and possible sepsis.  Monitor hemoglobin.  #  Metabolic Bone Disease: Check  phosphorus level.  Monitor calcium.  Resume home medication when able to take orally.  Discussed with the primary and the surgery team via secure messaging system.  Metha Kolasa Tanna Furry 03/18/2021, 2:55 PM

## 2021-03-18 NOTE — ED Notes (Signed)
MD made aware pt requesting something for pain

## 2021-03-18 NOTE — Progress Notes (Signed)
[place] Surgical Abscess   Dr. Manuella Ghazi asked me to talk to family. Spoke with Lexine Baton family friend who was with the son in patients room.  Updated on the mesenteric abscess, plan for Iv antibiotics and IR drainage of the abscess, likely repeat CT with po versus enterography to further assess why she had this abscess/ perforation, versus possible endoscopy, discussed that surgery at [facility] has been called and will help with care .  Curlene Labrum, MD

## 2021-03-18 NOTE — ED Notes (Signed)
Spoke with Colletta Maryland from lab, pt will have to have labs sent to [facility] r/t antibodies, to have units ready here we need orders for crossmatch and blood held for 2 units, EDP made aware. After multiple attempts including with ultrasound, unable to establish 20 gauge or better IV for radiology, EDP made aware. Extra pink top and lavender provided per lab request, lactic acid obtained per verbal order of Dr. Betsey Holiday.

## 2021-03-18 NOTE — Progress Notes (Signed)
   03/18/21 1945  Vitals  Temp 98.5 F (36.9 C)  Temp Source Oral  BP (!) 169/52  MAP (mmHg) 83  BP Location Left Arm  BP Method Automatic  Patient Position (if appropriate) Lying  ECG Heart Rate 71  Resp 18  Oxygen Therapy  SpO2 97 %  O2 Device Room Air  Pain Assessment  Pain Scale 0-10  Pain Score 0  Dialysis Weight  Weight 54.7 kg  Type of Weight Post-Dialysis  Post-Hemodialysis Assessment  Rinseback Volume (mL) 250 mL  KECN 249 V  Dialyzer Clearance Lightly streaked  Duration of HD Treatment -hour(s) 3.5 hour(s)  Hemodialysis Intake (mL) 700 mL  UF Total -Machine (mL) 700 mL  Net UF (mL) 0 mL  Tolerated HD Treatment Yes  Post-Hemodialysis Comments tx complete-pt stable  AVG/AVF Arterial Site Held (minutes) 15 minutes  AVG/AVF Venous Site Held (minutes) 15 minutes  Fistula / Graft Right Thigh Arteriovenous vein graft  No placement date or time found.   Placed prior to admission: Yes  Orientation: Right  Access Location: Thigh  Access Type: Arteriovenous vein graft  Site Condition No complications  Fistula / Graft Assessment Present;Thrill;Bruit  Status Deaccessed;Flushed  Drainage Description None

## 2021-03-19 ENCOUNTER — Inpatient Hospital Stay (HOSPITAL_COMMUNITY): Payer: Medicare Other

## 2021-03-19 DIAGNOSIS — K651 Peritoneal abscess: Secondary | ICD-10-CM

## 2021-03-19 DIAGNOSIS — D631 Anemia in chronic kidney disease: Secondary | ICD-10-CM

## 2021-03-19 DIAGNOSIS — I5022 Chronic systolic (congestive) heart failure: Secondary | ICD-10-CM | POA: Diagnosis not present

## 2021-03-19 DIAGNOSIS — N189 Chronic kidney disease, unspecified: Secondary | ICD-10-CM

## 2021-03-19 DIAGNOSIS — N186 End stage renal disease: Secondary | ICD-10-CM | POA: Diagnosis not present

## 2021-03-19 DIAGNOSIS — I10 Essential (primary) hypertension: Secondary | ICD-10-CM

## 2021-03-19 LAB — CBC
HCT: 21.9 % — ABNORMAL LOW (ref 36.0–46.0)
Hemoglobin: 7 g/dL — ABNORMAL LOW (ref 12.0–15.0)
MCH: 32.3 pg (ref 26.0–34.0)
MCHC: 32 g/dL (ref 30.0–36.0)
MCV: 100.9 fL — ABNORMAL HIGH (ref 80.0–100.0)
Platelets: 167 10*3/uL (ref 150–400)
RBC: 2.17 MIL/uL — ABNORMAL LOW (ref 3.87–5.11)
RDW: 13.3 % (ref 11.5–15.5)
WBC: 15.7 10*3/uL — ABNORMAL HIGH (ref 4.0–10.5)
nRBC: 0 % (ref 0.0–0.2)

## 2021-03-19 LAB — COMPREHENSIVE METABOLIC PANEL
ALT: 11 U/L (ref 0–44)
AST: 14 U/L — ABNORMAL LOW (ref 15–41)
Albumin: 2 g/dL — ABNORMAL LOW (ref 3.5–5.0)
Alkaline Phosphatase: 67 U/L (ref 38–126)
Anion gap: 9 (ref 5–15)
BUN: 17 mg/dL (ref 8–23)
CO2: 27 mmol/L (ref 22–32)
Calcium: 8.5 mg/dL — ABNORMAL LOW (ref 8.9–10.3)
Chloride: 99 mmol/L (ref 98–111)
Creatinine, Ser: 2.48 mg/dL — ABNORMAL HIGH (ref 0.44–1.00)
GFR, Estimated: 20 mL/min — ABNORMAL LOW (ref >60)
Glucose, Bld: 76 mg/dL (ref 70–99)
Potassium: 3.6 mmol/L (ref 3.5–5.1)
Sodium: 135 mmol/L (ref 135–145)
Total Bilirubin: 0.7 mg/dL (ref 0.3–1.2)
Total Protein: 5.3 g/dL — ABNORMAL LOW (ref 6.5–8.1)

## 2021-03-19 LAB — TYPE AND SCREEN
ABO/RH(D): O POS
Antibody Screen: POSITIVE
Donor AG Type: NEGATIVE
Donor AG Type: NEGATIVE
PT AG Type: NEGATIVE
Unit division: 0
Unit division: 0

## 2021-03-19 LAB — BPAM RBC
Blood Product Expiration Date: 202205072359
Blood Product Expiration Date: 202205222359
Unit Type and Rh: 5100
Unit Type and Rh: 5100

## 2021-03-19 LAB — MAGNESIUM: Magnesium: 2 mg/dL (ref 1.7–2.4)

## 2021-03-19 LAB — HEMOGLOBIN AND HEMATOCRIT, BLOOD
HCT: 23.9 % — ABNORMAL LOW (ref 36.0–46.0)
HCT: 25.4 % — ABNORMAL LOW (ref 36.0–46.0)
HCT: 25.3 % — ABNORMAL LOW (ref 36.0–46.0)
Hemoglobin: 7.6 g/dL — ABNORMAL LOW (ref 12.0–15.0)
Hemoglobin: 8.2 g/dL — ABNORMAL LOW (ref 12.0–15.0)
Hemoglobin: 8 g/dL — ABNORMAL LOW (ref 12.0–15.0)

## 2021-03-19 LAB — LACTIC ACID, PLASMA: Lactic Acid, Venous: 1.2 mmol/L (ref 0.5–1.9)

## 2021-03-19 MED ORDER — LATANOPROST 0.005 % OP SOLN
1.0000 [drp] | Freq: Every day | OPHTHALMIC | Status: DC
  Administered 2021-03-19 – 2021-03-29 (×10): 1 [drp] via OPHTHALMIC
  Filled 2021-03-19 (×2): qty 2.5

## 2021-03-19 MED ORDER — IOHEXOL 350 MG/ML SOLN
75.0000 mL | Freq: Once | INTRAVENOUS | Status: AC | PRN
  Administered 2021-03-19: 75 mL via INTRAVENOUS

## 2021-03-19 MED ORDER — HYDRALAZINE HCL 20 MG/ML IJ SOLN
10.0000 mg | Freq: Four times a day (QID) | INTRAMUSCULAR | Status: DC
  Administered 2021-03-19 – 2021-03-22 (×9): 10 mg via INTRAVENOUS
  Filled 2021-03-19 (×10): qty 1

## 2021-03-19 MED ORDER — IOHEXOL 9 MG/ML PO SOLN
ORAL | Status: AC
  Administered 2021-03-19: 500 mL
  Filled 2021-03-19: qty 1000

## 2021-03-19 MED ORDER — CYCLOSPORINE 0.05 % OP EMUL
1.0000 [drp] | Freq: Two times a day (BID) | OPHTHALMIC | Status: DC
  Administered 2021-03-19 – 2021-03-30 (×20): 1 [drp] via OPHTHALMIC
  Filled 2021-03-19 (×26): qty 1

## 2021-03-19 NOTE — Progress Notes (Signed)
TRH night shift.  The nursing staff reported that the patient's hemoglobin has decreased from 9.5 to 7.0 g/dL.Her vital signs have been stable, except for her systolic BP which has been recently elevated in the 150s and 160s through the night shift.  We will repeat H&H 0700 and 1300 later today.  Type and screen was obtained yesterday morning, but it was at [facility].  I will also order a type and screen at 0700 for [facility] blood bank.  Tennis Must, MD.

## 2021-03-19 NOTE — Progress Notes (Signed)
[name] NEPHROLOGY PROGRESS NOTE  Assessment/ Plan: Pt is a 70 y.o. yo female  with history of hypertension, HLD, anemia, acid reflux, ESRD on HD for last 23 years, TTS schedule at [facility], has right thigh AVG, transferred from [facility] for mesenteric abscess and possible bowel perforation, seen as a consultation for the management of ESRD.  #Mesenteric abscess with possible bowel perforation:  Seen by general surgery, plan for IR drain noted. On IV vancomycin and Zosyn. Further plan per surgery team.  # ESRD TTS at [facility]: Status post regular dialysis yesterday, tolerated well.  Volume status and electrolytes acceptable.  She has right thigh graft for the access.  No heparin.  Need outpatient treatment record.  Next HD on 5/3.  # Hypertension: Blood pressure and volume status acceptable.  Monitor BP.  Pain management.  # Anemia of ESRD: Drop in hemoglobin noted without any obvious sign of bleeding.  Check iron studies.  Transfuse blood as needed.  May need ESA, try to obtain outpatient record.  # Metabolic Bone Disease: Check phosphorus level.  Monitor calcium.  Resume home medication when able to take orally.  Subjective: Seen and examined at bedside.  Still NPO.  Patient reports persistent abdominal discomfort.  No nausea, vomiting, chest pain or shortness of breath.  Had dialysis yesterday. Objective Vital signs in last 24 hours: Vitals:   03/18/21 1940 03/18/21 1945 03/18/21 2049 03/19/21 0319  BP: (!) 159/70 (!) 169/52 (!) 160/49 (!) 154/51  Pulse:   69 60  Resp:  18 17 16   Temp:  98.5 F (36.9 C) 98.3 F (36.8 C) 97.8 F (36.6 C)  TempSrc:  Oral Oral Oral  SpO2:  97% 100% 100%  Weight:  54.7 kg    Height:       Weight change: -0.454 kg  Intake/Output Summary (Last 24 hours) at 03/19/2021 1127 Last data filed at 03/19/2021 0600 Gross per 24 hour  Intake 50 ml  Output 0 ml  Net 50 ml       Labs: Basic Metabolic Panel: Recent  Labs  Lab 03/18/21 0245 03/19/21 0102  NA 134* 135  K 3.5 3.6  CL 96* 99  CO2 27 27  GLUCOSE 118* 76  BUN 33* 17  CREATININE 4.05* 2.48*  CALCIUM 9.3 8.5*   Liver Function Tests: Recent Labs  Lab 03/18/21 0245 03/19/21 0102  AST 18 14*  ALT 13 11  ALKPHOS 73 67  BILITOT 0.6 0.7  PROT 6.0* 5.3*  ALBUMIN 2.5* 2.0*   No results for input(s): LIPASE, AMYLASE in the last 168 hours. No results for input(s): AMMONIA in the last 168 hours. CBC: Recent Labs  Lab 03/18/21 0245 03/19/21 0102 03/19/21 0635  WBC 20.3* 15.7*  --   NEUTROABS 18.2*  --   --   HGB 9.5* 7.0* 7.6*  HCT 30.2* 21.9* 23.9*  MCV 101.3* 100.9*  --   PLT 218 167  --    Cardiac Enzymes: No results for input(s): CKTOTAL, CKMB, CKMBINDEX, TROPONINI in the last 168 hours. CBG: No results for input(s): GLUCAP in the last 168 hours.  Iron Studies: No results for input(s): IRON, TIBC, TRANSFERRIN, FERRITIN in the last 72 hours. Studies/Results: CT ABDOMEN PELVIS W CONTRAST  Result Date: 03/18/2021 CLINICAL DATA:  Bloody stools with abdominal pain. EXAM: CT ABDOMEN AND PELVIS WITH CONTRAST TECHNIQUE: Multidetector CT imaging of the abdomen and pelvis was performed using the standard protocol following bolus administration of intravenous contrast. CONTRAST:  125mL OMNIPAQUE IOHEXOL 300 MG/ML  SOLN COMPARISON:  02/20/2021. FINDINGS: Lower chest: Heart is enlarged. Dependent atelectasis noted in the lung bases. Hepatobiliary: Tiny hypodensities in the right liver are stable since prior and also comparing back to 05/09/2020 suggesting benign etiology. Mild intra and extrahepatic biliary duct dilatation again noted with some associated periportal edema today. Common bile duct measures 12 mm diameter, increased from 8 mm on the 05/09/2020 exam. Gallbladder is markedly distended. Pancreas: Mild prominence of the ventral pancreatic duct. Dorsal duct is not well seen in the head of the pancreas but appears nondilated in the  body and tail. No discrete pancreatic mass lesion evident. Spleen: No splenomegaly. No focal mass lesion. Adrenals/Urinary Tract: Marked bilateral polycystic kidney disease noted with numerous calcifications/stones, similar to prior. As noted previously, there is fairly marked left-sided hydroureteronephrosis with collecting system and proximal ureter filled with high attenuation material. Left ureter remains dilated down to the level of the iliac crests to a point immediately adjacent to a surgical clip. Bladder is decompressed. Stomach/Bowel: Stomach is decompressed. No small bowel dilatation. Assessment of bowel limited by lack of mesenteric fat. A 5.0 x 4.3 x 3.1 cm collection of gas and debris is identified in the anterior central mesentery of the pelvis (see axial image 60/series 2 and sagittal image 72 of series 6). This shows an irregular rim of peripheral enhancement different than enhancement seen in bowel wall. Imaging features are very suspicious for mesenteric abscess, also visible on coronal image 47 where there may be a second smaller adjacent component visible on 46/5. This finding is not immediately adjacent to the left colon and etiology is uncertain although small bowel perforation would be a consideration. Several other small circular areas of gas and debris are seen in the lower small bowel mesentery, indeterminate. Vascular/Lymphatic: There is abdominal aortic atherosclerosis without aneurysm. Right external iliac vein stent device noted. Pseudoaneurysm noted in the arterial limb laterally with a second relatively large pseudoaneurysm near the venous and of the graft more medially. There is no gastrohepatic or hepatoduodenal ligament lymphadenopathy. No retroperitoneal or mesenteric lymphadenopathy. No pelvic sidewall lymphadenopathy. Reproductive: Calcified fibroids are noted in the uterus There is no adnexal mass. Other: Small to moderate volume free fluid noted with diffuse body wall edema.  Musculoskeletal: No worrisome lytic or sclerotic osseous abnormality. Advanced degenerative changes noted at L4-5 level. IMPRESSION: 1. 5.0 x 4.3 x 3.1 cm collection of gas and debris in the anterior central mesentery of the pelvis with an irregular rim of peripheral enhancement different than enhancement seen in bowel wall elsewhere. Although assessment is hindered by lack of mesenteric fat and bowel opacification, imaging features are very suspicious for mesenteric abscess. This finding is not immediately adjacent to the left colon and etiology is uncertain although small bowel perforation would be a consideration. 2. Several other small circular areas of gas and debris are seen in the lower small bowel mesentery, indeterminate. 3. Marked bilateral polycystic kidney disease with numerous calcifications/stones, similar to prior. There is fairly marked left hydroureteronephrosis with high attenuation material in the collecting system and proximal ureter, similar to prior. This may represent blood, infectious debris, or soft tissue/neoplasm. Ureteral obstruction is in the mid ureter at or immediately adjacent to a surgical clip. 4. Right groin dialysis graft with pseudoaneurysm formation. 5. Small to moderate volume free fluid with diffuse body wall edema. 6. Mild intra and extrahepatic biliary duct dilatation with some associated periportal edema. Common bile duct measures 12 mm diameter, increased from  8 mm on the 05/09/2020 exam. Correlation with liver function test may prove helpful. Gallbladder is markedly distended. 7. Aortic Atherosclerosis (ICD10-I70.0). Electronically Signed   By: Misty Stanley M.D.   On: 03/18/2021 10:42   US Abdomen Limited  Result Date: 03/18/2021 CLINICAL DATA:  Periumbilical pain for 6 weeks EXAM: ULTRASOUND ABDOMEN LIMITED RIGHT UPPER QUADRANT COMPARISON:  CT abdomen 05/09/2020, 02/20/2021, 03/19/2019 FINDINGS: Gallbladder: Distended gallbladder. No cholelithiasis. Tumefactive  sludge within the gallbladder. In negative sonographic Murphy sign. Mild gallbladder wall thickening measuring up to 4 mm. Common bile duct: Diameter: 12 mm.  No choledocholithiasis. Liver: No focal lesion identified. Increased hepatic parenchymal echogenicity. Portal vein is patent on color Doppler imaging with normal direction of blood flow towards the liver. Other: Polycystic right kidney. Small right pleural effusion. Small volume ascites. IMPRESSION: 1. Hydropic gallbladder with tumefactive sludge. No definite sonographic findings to suggest acute cholecystitis. 2. Dilated common bile duct of uncertain etiology. Electronically Signed   By: Kathreen Devoid   On: 03/18/2021 12:50    Medications: Infusions: . sodium chloride    . sodium chloride    . piperacillin-tazobactam (ZOSYN)  IV 3.375 g (03/19/21 1114)    Scheduled Medications: . Chlorhexidine Gluconate Cloth  6 each Topical Q0600  . sodium chloride flush  3 mL Intravenous Q12H  . [START ON 03/21/2021] vancomycin  500 mg Intravenous Q T,Th,Sa-HD    have reviewed scheduled and prn medications.  Physical Exam: General:NAD, comfortable Heart:RRR, s1s2 nl Lungs:clear b/l, no crackle Abdomen:soft,  non-distended Extremities:No edema Dialysis Access: Right thigh AV graft has aneurysmal dilatation with good thrill and bruit.  [name] [name] [name] 03/19/2021,11:27 AM  LOS: 1 day

## 2021-03-19 NOTE — Progress Notes (Signed)
Patient ID: [name], female   DOB: 09/14/1951, 70 y.o.   MRN: [id number]   WBC improved IR drain hopefully today Will follow-up tomorrow to see if there are any indications for surgery.  May start clear liquids after the drain is placed.  [name]. Georgette Dover, MD, [facility]  General/ Trauma Surgery   03/19/2021 9:00 AM

## 2021-03-19 NOTE — Progress Notes (Signed)
PROGRESS NOTE    [name]   AJO:[id number]  DOB: 24-Oct-1951  PCP: Glenda Chroman, MD    DOA: 03/17/2021 LOS: 1   Brief Narrative   [name] is a 70 y.o. female with medical history significant for ESRD on HD TTS, hypertension, dyslipidemia, chronic anemia, and GERD who presented to the ED at [facility] with complaints of worsening abdominal distention as well as diffuse abdominal pain associated with black and tarry stools.  Apparently symptoms have been ongoing for approximately 2 weeks, and poor appetite, no BM x about one week.   Evaluation in the ED: afebrile, stable vitals.  WBC 20.3k, Hbg 9.5, Cr 4 (ESRD), Lactic acid 2.0.  Given empiric Vanc/Zosyn.  Stool occult positive.  CT abdomen/pelvis with contrast showed a large collection of fluid with gas and debris consistent with a mesenteric abscess likely related to a small bowel perforation.   Assessment & Plan   Principal Problem:   Mesenteric abscess ([place]) Active Problems:   Perforation bowel (HCC)   Hyperlipidemia   Mitral valve regurgitation   Essential hypertension   Coronary artery disease   SYSTOLIC HEART FAILURE, CHRONIC   End stage renal disease (HCC)   Mesenteric abscess with possible associated bowel perforation --General surgery following --Repeating CT abd/pelvis with contrast today per GS --IR consulted for drain placement --Repeat lactic acid pending --Monitor abdominal exam --NPO for now, Clears after drain placed --Continue empiric antibiotics --Pain control PRN --Avoid IVF's given ESRD  Acute blood loss anemia superimposed on Anemia of chronic renal disease - acute blood loss due to above.   Hbg trend: 9.5 (down from 10.6 three wks prior) >> 7.0 >>7.6 --Monitor Hbg and transfuse if Hbg<7.0  --Pt agreeable to pRBC transfusion if needed --Type and screen'd  ESRD on HD TTS - Nephology following for dialysis.  Hypertension - Bps uncontrolled with meds held for NPO.   Start scheduled IV  hydralazine with hold parameters. Continue PRN Labetalol for now if still uncontrolled.  Dyslipidemia - Hold statin for now  CAD - stable, no chest pain.  Aspirin, statin, Imdur held  Patient BMI: Body mass index is 20.7 kg/m.   DVT prophylaxis: SCDs Start: 03/18/21 1207   Diet:  Diet Orders (From admission, onward)    Start     Ordered   03/18/21 1208  Diet NPO time specified  Diet effective now        03/18/21 1209            Code Status: Full Code    Subjective 03/19/21    Pt reports passing blood with BM this AM.  Having some central abdominal pain.  Currently drinking oral contrast for CT.  Denies any other acute complaints.    Disposition Plan & Communication   Status is: Inpatient  Inpatient status appropriate due to ongoing diagnostic evaluation and severity of illness.  Dispo: The patient is from: home              Anticipated d/c is to: home              Patient currently not medically stable for d/c.   Difficult to place patient No   Consults, Procedures, Significant Events   Consultants:   General surgery  IR  Procedures:   Pending - IR CT-guided drain placement  Antimicrobials:  Anti-infectives (From admission, onward)   Start     Dose/Rate Route Frequency Ordered Stop   03/21/21 1200  vancomycin (VANCOCIN) IVPB 500  mg/100 ml premix        500 mg 100 mL/hr over 60 Minutes Intravenous Every T-Th-Sa (Hemodialysis) 03/18/21 1229     03/18/21 1814  vancomycin (VANCOCIN) 1-5 GM/200ML-% IVPB       Note to Pharmacy: [name]   : cabinet override      03/18/21 1814 03/18/21 1819   03/18/21 1115  vancomycin (VANCOCIN) IVPB 1000 mg/200 mL premix        1,000 mg 200 mL/hr over 60 Minutes Intravenous  Once 03/18/21 1112 03/18/21 1919   03/18/21 1115  piperacillin-tazobactam (ZOSYN) IVPB 3.375 g        3.375 g 12.5 mL/hr over 240 Minutes Intravenous Every 12 hours 03/18/21 1112          Micro    Objective   Vitals:   03/18/21 1945  03/18/21 2049 03/19/21 0319 03/19/21 1323  BP: (!) 169/52 (!) 160/49 (!) 154/51 (!) 188/67  Pulse:  69 60 73  Resp: 18 17 16 20   Temp: 98.5 F (36.9 C) 98.3 F (36.8 C) 97.8 F (36.6 C) 98.1 F (36.7 C)  TempSrc: Oral Oral Oral   SpO2: 97% 100% 100% 100%  Weight: 54.7 kg     Height:        Intake/Output Summary (Last 24 hours) at 03/19/2021 1336 Last data filed at 03/19/2021 1200 Gross per 24 hour  Intake 1000 ml  Output 0 ml  Net 1000 ml   Filed Weights   03/17/21 2348 03/18/21 1600 03/18/21 1945  Weight: 56.7 kg 56.2 kg 54.7 kg    Physical Exam:  General exam: awake, alert, no acute distress HEENT: moist mucus membranes, hearing grossly normal  Respiratory system: CTAB, no wheezes, rales or rhonchi, normal respiratory effort. Cardiovascular system: normal S1/S2, RRR, no pedal edema.   Gastrointestinal system: soft, mildly tender on palpation without guarding or rebound tenderness Central nervous system: alert & Ox3, no gross focal neurologic deficits, normal speech Psychiatry: normal mood, congruent affect, judgement and insight appear normal  Labs   Data Reviewed: I have personally reviewed following labs and imaging studies  CBC: Recent Labs  Lab 03/18/21 0245 03/19/21 0102 03/19/21 0635  WBC 20.3* 15.7*  --   NEUTROABS 18.2*  --   --   HGB 9.5* 7.0* 7.6*  HCT 30.2* 21.9* 23.9*  MCV 101.3* 100.9*  --   PLT 218 167  --    Basic Metabolic Panel: Recent Labs  Lab 03/18/21 0245 03/19/21 0102  NA 134* 135  K 3.5 3.6  CL 96* 99  CO2 27 27  GLUCOSE 118* 76  BUN 33* 17  CREATININE 4.05* 2.48*  CALCIUM 9.3 8.5*  MG  --  2.0   GFR: Estimated Creatinine Clearance: 18 mL/min (A) (by C-G formula based on SCr of 2.48 mg/dL (H)). Liver Function Tests: Recent Labs  Lab 03/18/21 0245 03/19/21 0102  AST 18 14*  ALT 13 11  ALKPHOS 73 67  BILITOT 0.6 0.7  PROT 6.0* 5.3*  ALBUMIN 2.5* 2.0*   No results for input(s): LIPASE, AMYLASE in the last 168  hours. No results for input(s): AMMONIA in the last 168 hours. Coagulation Profile: Recent Labs  Lab 03/18/21 0245  INR 1.2   Cardiac Enzymes: No results for input(s): CKTOTAL, CKMB, CKMBINDEX, TROPONINI in the last 168 hours. BNP (last 3 results) No results for input(s): PROBNP in the last 8760 hours. HbA1C: No results for input(s): HGBA1C in the last 72 hours. CBG: No results for input(s):  GLUCAP in the last 168 hours. Lipid Profile: No results for input(s): CHOL, HDL, LDLCALC, TRIG, CHOLHDL, LDLDIRECT in the last 72 hours. Thyroid Function Tests: No results for input(s): TSH, T4TOTAL, FREET4, T3FREE, THYROIDAB in the last 72 hours. Anemia Panel: No results for input(s): VITAMINB12, FOLATE, FERRITIN, TIBC, IRON, RETICCTPCT in the last 72 hours. Sepsis Labs: Recent Labs  Lab 03/18/21 0457  LATICACIDVEN 2.0*    Recent Results (from the past 240 hour(s))  Resp Panel by RT-PCR (Flu A&B, Covid) Nasopharyngeal Swab     Status: None   Collection Time: 03/18/21  8:15 AM   Specimen: Nasopharyngeal Swab; Nasopharyngeal(NP) swabs in vial transport medium  Result Value Ref Range Status   SARS Coronavirus 2 by RT PCR NEGATIVE NEGATIVE Final    Comment: (NOTE) SARS-CoV-2 target nucleic acids are NOT DETECTED.  The SARS-CoV-2 RNA is generally detectable in upper respiratory specimens during the acute phase of infection. The lowest concentration of SARS-CoV-2 viral copies this assay can detect is 138 copies/mL. A negative result does not preclude SARS-Cov-2 infection and should not be used as the sole basis for treatment or other patient management decisions. A negative result may occur with  improper specimen collection/handling, submission of specimen other than nasopharyngeal swab, presence of viral mutation(s) within the areas targeted by this assay, and inadequate number of viral copies(<138 copies/mL). A negative result must be combined with clinical observations, patient  history, and epidemiological information. The expected result is Negative.  Fact Sheet for Patients:  EntrepreneurPulse.com.au  Fact Sheet for Healthcare Providers:  IncredibleEmployment.be  This test is no t yet approved or cleared by the [place] FDA and  has been authorized for detection and/or diagnosis of SARS-CoV-2 by FDA under an Emergency Use Authorization (EUA). This EUA will remain  in effect (meaning this test can be used) for the duration of the COVID-19 declaration under Section 564(b)(1) of the Act, 21 U.S.C.section 360bbb-3(b)(1), unless the authorization is terminated  or revoked sooner.       Influenza A by PCR NEGATIVE NEGATIVE Final   Influenza B by PCR NEGATIVE NEGATIVE Final    Comment: (NOTE) The Xpert Xpress SARS-CoV-2/FLU/RSV plus assay is intended as an aid in the diagnosis of influenza from Nasopharyngeal swab specimens and should not be used as a sole basis for treatment. Nasal washings and aspirates are unacceptable for Xpert Xpress SARS-CoV-2/FLU/RSV testing.  Fact Sheet for Patients: EntrepreneurPulse.com.au  Fact Sheet for Healthcare Providers: IncredibleEmployment.be  This test is not yet approved or cleared by the [place] FDA and has been authorized for detection and/or diagnosis of SARS-CoV-2 by FDA under an Emergency Use Authorization (EUA). This EUA will remain in effect (meaning this test can be used) for the duration of the COVID-19 declaration under Section 564(b)(1) of the Act, 21 U.S.C. section 360bbb-3(b)(1), unless the authorization is terminated or revoked.  Performed at [facility], [address], [place], [place] [address]       Imaging Studies   CT ABDOMEN PELVIS W CONTRAST  Result Date: 03/18/2021 CLINICAL DATA:  Bloody stools with abdominal pain. EXAM: CT ABDOMEN AND PELVIS WITH CONTRAST TECHNIQUE: Multidetector CT imaging of the abdomen  and pelvis was performed using the standard protocol following bolus administration of intravenous contrast. CONTRAST:  176mL OMNIPAQUE IOHEXOL 300 MG/ML  SOLN COMPARISON:  02/20/2021. FINDINGS: Lower chest: Heart is enlarged. Dependent atelectasis noted in the lung bases. Hepatobiliary: Tiny hypodensities in the right liver are stable since prior and also comparing back to 05/09/2020 suggesting  benign etiology. Mild intra and extrahepatic biliary duct dilatation again noted with some associated periportal edema today. Common bile duct measures 12 mm diameter, increased from 8 mm on the 05/09/2020 exam. Gallbladder is markedly distended. Pancreas: Mild prominence of the ventral pancreatic duct. Dorsal duct is not well seen in the head of the pancreas but appears nondilated in the body and tail. No discrete pancreatic mass lesion evident. Spleen: No splenomegaly. No focal mass lesion. Adrenals/Urinary Tract: Marked bilateral polycystic kidney disease noted with numerous calcifications/stones, similar to prior. As noted previously, there is fairly marked left-sided hydroureteronephrosis with collecting system and proximal ureter filled with high attenuation material. Left ureter remains dilated down to the level of the iliac crests to a point immediately adjacent to a surgical clip. Bladder is decompressed. Stomach/Bowel: Stomach is decompressed. No small bowel dilatation. Assessment of bowel limited by lack of mesenteric fat. A 5.0 x 4.3 x 3.1 cm collection of gas and debris is identified in the anterior central mesentery of the pelvis (see axial image 60/series 2 and sagittal image 72 of series 6). This shows an irregular rim of peripheral enhancement different than enhancement seen in bowel wall. Imaging features are very suspicious for mesenteric abscess, also visible on coronal image 47 where there may be a second smaller adjacent component visible on 46/5. This finding is not immediately adjacent to the left  colon and etiology is uncertain although small bowel perforation would be a consideration. Several other small circular areas of gas and debris are seen in the lower small bowel mesentery, indeterminate. Vascular/Lymphatic: There is abdominal aortic atherosclerosis without aneurysm. Right external iliac vein stent device noted. Pseudoaneurysm noted in the arterial limb laterally with a second relatively large pseudoaneurysm near the venous and of the graft more medially. There is no gastrohepatic or hepatoduodenal ligament lymphadenopathy. No retroperitoneal or mesenteric lymphadenopathy. No pelvic sidewall lymphadenopathy. Reproductive: Calcified fibroids are noted in the uterus There is no adnexal mass. Other: Small to moderate volume free fluid noted with diffuse body wall edema. Musculoskeletal: No worrisome lytic or sclerotic osseous abnormality. Advanced degenerative changes noted at L4-5 level. IMPRESSION: 1. 5.0 x 4.3 x 3.1 cm collection of gas and debris in the anterior central mesentery of the pelvis with an irregular rim of peripheral enhancement different than enhancement seen in bowel wall elsewhere. Although assessment is hindered by lack of mesenteric fat and bowel opacification, imaging features are very suspicious for mesenteric abscess. This finding is not immediately adjacent to the left colon and etiology is uncertain although small bowel perforation would be a consideration. 2. Several other small circular areas of gas and debris are seen in the lower small bowel mesentery, indeterminate. 3. Marked bilateral polycystic kidney disease with numerous calcifications/stones, similar to prior. There is fairly marked left hydroureteronephrosis with high attenuation material in the collecting system and proximal ureter, similar to prior. This may represent blood, infectious debris, or soft tissue/neoplasm. Ureteral obstruction is in the mid ureter at or immediately adjacent to a surgical clip. 4. Right  groin dialysis graft with pseudoaneurysm formation. 5. Small to moderate volume free fluid with diffuse body wall edema. 6. Mild intra and extrahepatic biliary duct dilatation with some associated periportal edema. Common bile duct measures 12 mm diameter, increased from 8 mm on the 05/09/2020 exam. Correlation with liver function test may prove helpful. Gallbladder is markedly distended. 7. Aortic Atherosclerosis (ICD10-I70.0). Electronically Signed   By: Misty Stanley M.D.   On: 03/18/2021 10:42   US Abdomen Limited  Result Date: 03/18/2021 CLINICAL DATA:  Periumbilical pain for 6 weeks EXAM: ULTRASOUND ABDOMEN LIMITED RIGHT UPPER QUADRANT COMPARISON:  CT abdomen 05/09/2020, 02/20/2021, 03/19/2019 FINDINGS: Gallbladder: Distended gallbladder. No cholelithiasis. Tumefactive sludge within the gallbladder. In negative sonographic Murphy sign. Mild gallbladder wall thickening measuring up to 4 mm. Common bile duct: Diameter: 12 mm.  No choledocholithiasis. Liver: No focal lesion identified. Increased hepatic parenchymal echogenicity. Portal vein is patent on color Doppler imaging with normal direction of blood flow towards the liver. Other: Polycystic right kidney. Small right pleural effusion. Small volume ascites. IMPRESSION: 1. Hydropic gallbladder with tumefactive sludge. No definite sonographic findings to suggest acute cholecystitis. 2. Dilated common bile duct of uncertain etiology. Electronically Signed   By: Kathreen Devoid   On: 03/18/2021 12:50     Medications   Scheduled Meds: . Chlorhexidine Gluconate Cloth  6 each Topical Q0600  . sodium chloride flush  3 mL Intravenous Q12H  . [START ON 03/21/2021] vancomycin  500 mg Intravenous Q T,Th,Sa-HD   Continuous Infusions: . sodium chloride    . sodium chloride    . piperacillin-tazobactam (ZOSYN)  IV 3.375 g (03/19/21 1114)       LOS: 1 day    Time spent: 30 minutes with >50% spent at bedside and in coordination of care.    Ezekiel Slocumb, DO [facility]  03/19/2021, 1:36 PM      If 7PM-7AM, please contact night-coverage. How to contact the [facility] Attending or Consulting provider [place] or covering provider during after hours [place], for this patient?    1. Check the care team in [facility] and look for a) attending/consulting [facility] provider listed and b) the [facility] team listed 2. Log into [email or web address] and use [place]'s universal password to access. If you do not have the password, please contact the hospital operator. 3. Locate the [facility] provider you are looking for under [facility] and page to a number that you can be directly reached. 4. If you still have difficulty reaching the provider, please page the [facility] (Director on Call) for the Hospitalists listed on amion for assistance.

## 2021-03-19 NOTE — Progress Notes (Signed)
Pt noted hemoglobin level drop from 9.5 to 7. Notified on call Dr Olevia Bowens via text page with new orders received.

## 2021-03-20 DIAGNOSIS — N186 End stage renal disease: Secondary | ICD-10-CM | POA: Diagnosis not present

## 2021-03-20 DIAGNOSIS — I10 Essential (primary) hypertension: Secondary | ICD-10-CM | POA: Diagnosis not present

## 2021-03-20 DIAGNOSIS — K651 Peritoneal abscess: Secondary | ICD-10-CM | POA: Diagnosis not present

## 2021-03-20 DIAGNOSIS — E782 Mixed hyperlipidemia: Secondary | ICD-10-CM

## 2021-03-20 DIAGNOSIS — I5022 Chronic systolic (congestive) heart failure: Secondary | ICD-10-CM | POA: Diagnosis not present

## 2021-03-20 LAB — GLUCOSE, CAPILLARY
Glucose-Capillary: 136 mg/dL — ABNORMAL HIGH (ref 70–99)
Glucose-Capillary: 44 mg/dL — CL (ref 70–99)
Glucose-Capillary: 55 mg/dL — ABNORMAL LOW (ref 70–99)
Glucose-Capillary: 155 mg/dL — ABNORMAL HIGH (ref 70–99)

## 2021-03-20 LAB — CBC
HCT: 25.7 % — ABNORMAL LOW (ref 36.0–46.0)
Hemoglobin: 8.1 g/dL — ABNORMAL LOW (ref 12.0–15.0)
MCH: 31.4 pg (ref 26.0–34.0)
MCHC: 31.5 g/dL (ref 30.0–36.0)
MCV: 99.6 fL (ref 80.0–100.0)
Platelets: 212 10*3/uL (ref 150–400)
RBC: 2.58 MIL/uL — ABNORMAL LOW (ref 3.87–5.11)
RDW: 13.2 % (ref 11.5–15.5)
WBC: 19.2 10*3/uL — ABNORMAL HIGH (ref 4.0–10.5)
nRBC: 0 % (ref 0.0–0.2)

## 2021-03-20 LAB — COMPREHENSIVE METABOLIC PANEL
ALT: 11 U/L (ref 0–44)
AST: 14 U/L — ABNORMAL LOW (ref 15–41)
Albumin: 2.2 g/dL — ABNORMAL LOW (ref 3.5–5.0)
Alkaline Phosphatase: 73 U/L (ref 38–126)
Anion gap: 12 (ref 5–15)
BUN: 28 mg/dL — ABNORMAL HIGH (ref 8–23)
CO2: 24 mmol/L (ref 22–32)
Calcium: 8.8 mg/dL — ABNORMAL LOW (ref 8.9–10.3)
Chloride: 97 mmol/L — ABNORMAL LOW (ref 98–111)
Creatinine, Ser: 3.65 mg/dL — ABNORMAL HIGH (ref 0.44–1.00)
GFR, Estimated: 13 mL/min — ABNORMAL LOW (ref >60)
Glucose, Bld: 43 mg/dL — CL (ref 70–99)
Potassium: 3.8 mmol/L (ref 3.5–5.1)
Sodium: 133 mmol/L — ABNORMAL LOW (ref 135–145)
Total Bilirubin: 1.3 mg/dL — ABNORMAL HIGH (ref 0.3–1.2)
Total Protein: 6.1 g/dL — ABNORMAL LOW (ref 6.5–8.1)

## 2021-03-20 LAB — HEPATITIS B CORE ANTIBODY, TOTAL: Hep B Core Total Ab: NONREACTIVE

## 2021-03-20 LAB — HEPATITIS B SURFACE ANTIBODY, QUANTITATIVE: Hep B S AB Quant (Post): 18.9 m[IU]/mL (ref >9.9)

## 2021-03-20 MED ORDER — PANTOPRAZOLE SODIUM 40 MG IV SOLR
40.0000 mg | Freq: Two times a day (BID) | INTRAVENOUS | Status: DC
  Administered 2021-03-20 – 2021-03-22 (×4): 40 mg via INTRAVENOUS
  Filled 2021-03-20 (×5): qty 40

## 2021-03-20 MED ORDER — DEXTROSE 50 % IV SOLN: 25.0000 g | INTRAVENOUS | Status: AC

## 2021-03-20 MED ORDER — DEXTROSE 50 % IV SOLN
INTRAVENOUS | Status: AC
  Filled 2021-03-20: qty 50

## 2021-03-20 MED ORDER — DEXTROSE 50 % IV SOLN
1.0000 | Freq: Once | INTRAVENOUS | Status: AC
  Administered 2021-03-20: 50 mL via INTRAVENOUS

## 2021-03-20 MED ORDER — SODIUM CHLORIDE 0.9 % IV SOLN
2.2500 g | Freq: Three times a day (TID) | INTRAVENOUS | Status: DC
  Administered 2021-03-20 – 2021-03-22 (×7): 2.25 g via INTRAVENOUS
  Filled 2021-03-20 (×2): qty 10
  Filled 2021-03-20 (×2): qty 0.08
  Filled 2021-03-20: qty 10
  Filled 2021-03-20: qty 0.08
  Filled 2021-03-20: qty 10
  Filled 2021-03-20 (×2): qty 0.08
  Filled 2021-03-20 (×2): qty 10
  Filled 2021-03-20: qty 0.08

## 2021-03-20 MED ORDER — DEXTROSE 50 % IV SOLN
12.5000 g | INTRAVENOUS | Status: AC
  Administered 2021-03-20: 12.5 g via INTRAVENOUS
  Filled 2021-03-20: qty 50

## 2021-03-20 NOTE — Progress Notes (Signed)
[facility] NEPHROLOGY PROGRESS NOTE  Assessment/ Plan: 62F  with history of hypertension, HLD, anemia, acid reflux, ESRD on HD for last 23 years, TTS schedule at [facility], has right thigh AVG, transferred from [facility] for mesenteric abscess and possible bowel perforation, seen as a consultation for the management of ESRD.  #Mesenteric abscess with possible bowel perforation:  Seen by general surgery, rpt imaging improved, no current plan for IR drain. On IV vancomycin and Zosyn. Further plan per surgery team.  # ESRD TTS at [facility]: On schedule. Volume status and electrolytes acceptable.  She has right thigh graft for the access.  No heparin.  Need outpatient treatment record.  Next HD on 5/3 3K, 3.5h, 1-2L UF, AVG, no heparin.  # Hypertension: Blood pressure and volume status acceptable.  Monitor BP.  Pain management.  # Anemia of ESRD: Hb stable 8.1.  No Fe while concern for infection. Transfuse blood as needed.  May need ESA, trying to obtain outpatient record.  # Metabolic Bone Disease: Check phosphorus level.  Monitor calcium.  Resume home medication when able to take orally.  Subjective: Seen and examined at bedside.  Still NPO.  Patient reports persistent abdominal discomfort.  No nausea, vomiting, chest pain or shortness of breath.  Had dialysis yesterday.  Objective Vital signs in last 24 hours: Vitals:   03/19/21 1323 03/19/21 2057 03/20/21 0322 03/20/21 0531  BP: (!) 188/67 (!) 160/52 (!) 149/53 (!) 157/57  Pulse: 73 66  63  Resp: 20 18  16   Temp: 98.1 F (36.7 C) 98 F (36.7 C)  97.8 F (36.6 C)  TempSrc:      SpO2: 100% 99%  98%  Weight:      Height:       Weight change:   Intake/Output Summary (Last 24 hours) at 03/20/2021 1012 Last data filed at 03/20/2021 7425 Gross per 24 hour  Intake 950 ml  Output --  Net 950 ml       Labs: Basic Metabolic Panel: Recent Labs  Lab 03/18/21 0245 03/19/21 0102 03/20/21 0059  NA  134* 135 133*  K 3.5 3.6 3.8  CL 96* 99 97*  CO2 27 27 24   GLUCOSE 118* 76 43*  BUN 33* 17 28*  CREATININE 4.05* 2.48* 3.65*  CALCIUM 9.3 8.5* 8.8*   Liver Function Tests: Recent Labs  Lab 03/18/21 0245 03/19/21 0102 03/20/21 0059  AST 18 14* 14*  ALT 13 11 11   ALKPHOS 73 67 73  BILITOT 0.6 0.7 1.3*  PROT 6.0* 5.3* 6.1*  ALBUMIN 2.5* 2.0* 2.2*   No results for input(s): LIPASE, AMYLASE in the last 168 hours. No results for input(s): AMMONIA in the last 168 hours. CBC: Recent Labs  Lab 03/18/21 0245 03/19/21 0102 03/19/21 0635 03/19/21 1518 03/19/21 1846 03/20/21 0059  WBC 20.3* 15.7*  --   --   --  19.2*  NEUTROABS 18.2*  --   --   --   --   --   HGB 9.5* 7.0*   < > 8.2* 8.0* 8.1*  HCT 30.2* 21.9*   < > 25.4* 25.3* 25.7*  MCV 101.3* 100.9*  --   --   --  99.6  PLT 218 167  --   --   --  212   < > = values in this interval not displayed.   Cardiac Enzymes: No results for input(s): CKTOTAL, CKMB, CKMBINDEX, TROPONINI in the last 168 hours. CBG: Recent Labs  Lab 03/20/21  0241 03/20/21 0317  GLUCAP 44* 136*    Iron Studies: No results for input(s): IRON, TIBC, TRANSFERRIN, FERRITIN in the last 72 hours. Studies/Results: CT ABDOMEN PELVIS W CONTRAST  Result Date: 03/19/2021 CLINICAL DATA:  Suspected abdominal abscess. EXAM: CT ABDOMEN AND PELVIS WITH CONTRAST TECHNIQUE: Multidetector CT imaging of the abdomen and pelvis was performed using the standard protocol following bolus administration of intravenous contrast. CONTRAST:  40mL OMNIPAQUE IOHEXOL 350 MG/ML SOLN COMPARISON:  None. FINDINGS: Lower chest: Basilar atelectasis bilaterally with small bilateral pleural effusions. Hepatobiliary: Small hepatic cysts with periportal edema and intrahepatic biliary duct dilatation, similar to prior. Gallbladder is massively distended as before. Common bile duct measures up to 12 mm diameter, stable. Pancreas: No focal mass lesion. No dilatation of the main duct. No  intraparenchymal cyst. No peripancreatic edema. Spleen: Tiny hypodensity in the medial spleen is similar to prior. Adrenals/Urinary Tract: Stable appearance of the multicystic kidneys bilaterally with dilated left intrarenal collecting system and proximal left ureter down to the level of the previously described retroperitoneal clip. Bladder is nondistended. Transplant kidney noted left pelvis. Stomach/Bowel: Stomach is decompressed. Large duodenal diverticulum noted small bowel is clustered in the anterior abdomen due to the renal enlargement and lack of intra-abdominal fat. The 5.0 x 4.3 cm rim enhancing collection of gas and debris seen previously is not evident on the current study although there is a small 2 x 1.2 cm fluid density structure at this location on image 55/3. Multiple additional round collections of gas and debris are again noted in the mesentery, clearly seen to represent small bowel diverticuli on today's study, 1 of which has become opacified (axial 57/3 and coronal 30/6). Colon is nondistended with features suggesting diffuse diverticular disease. Vascular/Lymphatic: There is abdominal aortic atherosclerosis without aneurysm. Right external iliac vein stent again noted. There is no gastrohepatic or hepatoduodenal ligament lymphadenopathy. No retroperitoneal or mesenteric lymphadenopathy. No pelvic sidewall lymphadenopathy. Right groin dialysis fistula has been incompletely visualized. Reproductive: Calcified fibroids.  There is no adnexal mass. Other: Diffuse body wall and mesenteric edema. Musculoskeletal: No worrisome lytic or sclerotic osseous abnormality. IMPRESSION: 1. The 5.0 x 4.3 cm rim enhancing collection of gas and debris seen previously is not evident on the current study although there is a small 2 x 1.2 cm fluid density structure at this location. Presumably this was a large irregular patulous diverticulum that has decompressed in the interval or dilated irregular small bowel  loop. 2. Multiple additional round collections of gas and debris are again noted in the mesentery, clearly seen to represent small bowel diverticuli on today's study. 3. Massive distention of the gallbladder with periportal edema and biliary duct dilatation, similar to prior. 4. Stable appearance of the multicystic kidneys bilaterally with dilated left intrarenal collecting system and proximal left ureter down to the level of the previously described retroperitoneal clip. 5. Diffuse body wall and mesenteric edema with small bilateral pleural effusions. 6. Aortic Atherosclerosis (ICD10-I70.0). Electronically Signed   By: Misty Stanley M.D.   On: 03/19/2021 14:59   US Abdomen Limited  Result Date: 03/18/2021 CLINICAL DATA:  Periumbilical pain for 6 weeks EXAM: ULTRASOUND ABDOMEN LIMITED RIGHT UPPER QUADRANT COMPARISON:  CT abdomen 05/09/2020, 02/20/2021, 03/19/2019 FINDINGS: Gallbladder: Distended gallbladder. No cholelithiasis. Tumefactive sludge within the gallbladder. In negative sonographic Murphy sign. Mild gallbladder wall thickening measuring up to 4 mm. Common bile duct: Diameter: 12 mm.  No choledocholithiasis. Liver: No focal lesion identified. Increased hepatic parenchymal echogenicity. Portal vein is patent on color Doppler  imaging with normal direction of blood flow towards the liver. Other: Polycystic right kidney. Small right pleural effusion. Small volume ascites. IMPRESSION: 1. Hydropic gallbladder with tumefactive sludge. No definite sonographic findings to suggest acute cholecystitis. 2. Dilated common bile duct of uncertain etiology. Electronically Signed   By: Kathreen Devoid   On: 03/18/2021 12:50    Medications: Infusions: . sodium chloride    . sodium chloride    . piperacillin-tazobactam (ZOSYN)  IV      Scheduled Medications: . Chlorhexidine Gluconate Cloth  6 each Topical Q0600  . cycloSPORINE  1 drop Both Eyes BID  . dextrose      . hydrALAZINE  10 mg Intravenous Q6H  .  latanoprost  1 drop Both Eyes QHS  . sodium chloride flush  3 mL Intravenous Q12H  . [START ON 03/21/2021] vancomycin  500 mg Intravenous Q T,Th,Sa-HD    have reviewed scheduled and prn medications.  Physical Exam: General:NAD, comfortable Heart:RRR, s1s2 nl Lungs:clear b/l, no crackle Abdomen:soft,  non-distended Extremities:No edema Dialysis Access: Right thigh AV graft has aneurysmal dilatation with good thrill and bruit.  Dezaree Tracey B Aleynah Rocchio 03/20/2021,10:12 AM  LOS: 2 days

## 2021-03-20 NOTE — Consult Note (Signed)
Consult Note  [name] 08/19/1951  [id number].    Requesting MD: Dr. Manuella Ghazi Chief Complaint/Reason for Consult: possible mesenteric abscess  HPI:  [name] a 70 y.o.femalewith medical history significant forESRD on HD TTS, hypertension, dyslipidemia, chronic anemia, and GERD who presented to the ED at [facility] with complaints of worsening abdominal distention as well as diffuse abdominal pain associated with black and tarry stools. Apparently symptoms have been ongoing for approximately 2 weeks, and poor appetite, no BM x about one week. She states she has seen blood in and on her stools.   Work up in the ED showed: afebrile, stable vitals.  WBC 20.3k, Hbg 9.5, Cr 4 (ESRD), Lactic acid 2.0.  Given empiric Vanc/Zosyn.  Stool occult positive.  CT abdomen/pelvis with contrast showed a large collection of fluid with gas and debris consistent with a mesenteric abscess likely related to a small bowel perforation.   Repeat CT scan here showing: The 5.0 x 4.3 cm rim enhancing collection of gas and debris seen previously is not evident on the current study although there is a small 2 x 1.2 cm fluid density structure at this location. Presumably this was a large irregular patulous diverticulum that has decompressed in the interval or dilated irregular small bowel loop. 2. Multiple additional round collections of gas and debris are again noted in the mesentery, clearly seen to represent small bowel diverticuli on today's study.  We were asked to see in regard to possible mesenteric abscess.  Today patients states she has diffuse abdominal pain but worst severity in suprapubic region. She had nausea prior to admission but none now while being NPO. She states subjective fever pre admission but none here. She does not void. She again admits to having bloody and black tarry stools. She is passing flatus and liquid BM today  Blood thinners: none Past Surgeries: kidney transplant  Per  chart review last colonoscopy 04/2011 by Dr. Laural Golden with findings of pancolonic diverticulosis, small external hemorrhoids. Repeat was recommended for 10 years  ROS: Review of Systems  Constitutional: Positive for fever (subjective). Negative for chills.  Respiratory: Negative for cough and shortness of breath.   Cardiovascular: Negative for palpitations and leg swelling.  Gastrointestinal: Positive for abdominal pain, blood in stool and diarrhea. Negative for nausea and vomiting.    No family history on file.  Past Medical History:  Diagnosis Date  . Chronic systolic heart failure ([place])   . Coronary atherosclerosis of native coronary artery   . Diseases of tricuspid valve   . End stage renal disease ([place])    dialysis T,Th & Sat  . Mitral valve insufficiency and aortic valve insufficiency   . Other and unspecified hyperlipidemia   . Secondary cardiomyopathy, unspecified   . Unspecified essential hypertension     Past Surgical History:  Procedure Laterality Date  . A/V SHUNTOGRAM N/A 08/10/2019   Procedure: A/V SHUNTOGRAM;  Surgeon: Algernon Huxley, MD;  Location: [facility];  Service: Cardiovascular;  Laterality: N/A;  . AV FISTULA PLACEMENT Right 2012  . AV FISTULA PLACEMENT Left 2004  . CATARACT EXTRACTION W/PHACO Left 08/07/2019   Procedure: CATARACT EXTRACTION PHACO AND INTRAOCULAR LENS PLACEMENT LEFT EYE;  Surgeon: [name], MD;  Location: [facility];  Service: Ophthalmology;  Laterality: Left;  left  . CATARACT EXTRACTION W/PHACO Right 08/21/2019   Procedure: CATARACT EXTRACTION PHACO AND INTRAOCULAR LENS PLACEMENT RIGHT EYE (CDE: 5.45);  Surgeon: [name], MD;  Location: [facility];  Service: Ophthalmology;  Laterality: Right;  . INSERTION OF DIALYSIS CATHETER N/A 01/08/2013   Procedure: INSERTION OF DIALYSIS CATHETER;  Surgeon: Angelia Mould, MD;  Location: [facility];  Service: Vascular;  Laterality: N/A;  . KIDNEY TRANSPLANT Left 2002   pt. states transplant  lasted 2 yrs.  Marland Kitchen PERIPHERAL VASCULAR CATHETERIZATION Right 08/27/2016   Procedure: A/V Shuntogram/Fistulagram;  Surgeon: Algernon Huxley, MD;  Location: [place];  Service: Cardiovascular;  Laterality: Right;    Social History:  reports that she has never smoked. She has never used smokeless tobacco. She reports that she does not drink alcohol and does not use drugs.  Allergies:  Allergies  Allergen Reactions  . Dilaudid [Hydromorphone] Nausea And Vomiting  . Dilaudid  [Hydromorphone Hcl]   . Tape Rash    Plastic tape    Medications Prior to Admission  Medication Sig Dispense Refill  . acetaminophen (TYLENOL) 500 MG tablet Take 1,000 mg by mouth every 6 (six) hours as needed for pain (headache).    Marland Kitchen aspirin 81 MG tablet Take 81 mg by mouth daily.    . B Complex-C-Folic Acid (RENAL) 1 MG CAPS Take 1 mg by mouth daily.    . cycloSPORINE (RESTASIS) 0.05 % ophthalmic emulsion Place 1 drop into both eyes 2 (two) times daily.    Marland Kitchen latanoprost (XALATAN) 0.005 % ophthalmic solution Place 1 drop into both eyes at bedtime.    . B Complex-C-Zn-Folic Acid (NEPHPLEX RX PO) Take 1 tablet by mouth daily.    . cinacalcet (SENSIPAR) 30 MG tablet Take 30 mg by mouth daily after supper.    . docusate sodium (COLACE) 100 MG capsule Take 100 mg by mouth daily.    . isosorbide mononitrate (IMDUR) 30 MG 24 hr tablet Take 30 mg by mouth daily.    Marland Kitchen labetalol (NORMODYNE) 200 MG tablet Take 100 mg by mouth 2 (two) times daily.    . minoxidil (LONITEN) 2.5 MG tablet Take 2.5 mg by mouth 2 (two) times daily.    Marland Kitchen omeprazole (PRILOSEC) 40 MG capsule Take 40 mg by mouth daily.    . sevelamer carbonate (RENVELA) 800 MG tablet Take 800-2,400 mg by mouth 3 (three) times daily with meals. 800 mg with snacks. 2,400 mg with meals    . simvastatin (ZOCOR) 20 MG tablet Take 20 mg by mouth daily.    . traMADol (ULTRAM) 50 MG tablet Take 50 mg by mouth daily as needed for moderate pain.      Blood pressure (!)  157/57, pulse 63, temperature 97.8 F (36.6 C), resp. rate 16, height 5\' 4"  (1.626 m), weight 54.7 kg, SpO2 98 %. Physical Exam:  General: pleasant, chronically ill appearing female who is sitting up in chair in NAD HEENT: head is normocephalic, atraumatic. Ears and nose without any masses or lesions.  Mouth is pink and moist Heart: regular, rate, and rhythm.  Normal s1,s2. No obvious murmurs, gallops, or rubs noted.  Palpable radial and pedal pulses bilaterally Lungs: CTAB, no wheezes, rhonchi, or rales noted.  Respiratory effort nonlabored Abd: +BS, visible umbilical hernia which reduces easily. Mildly distended. Diffusely tender to palpation - worst over suprapubic region  MS: all 4 extremities are symmetrical with no cyanosis, clubbing, or edema. Skin: warm and dry with no masses, lesions, or rashes Neuro: Cranial nerves 2-12 grossly intact, sensation is normal throughout Psych: A&Ox3 with an appropriate affect.   Results for orders placed or performed during the hospital encounter of  03/17/21 (from the past 48 hour(s))  Hepatitis B surface antigen     Status: None   Collection Time: 03/18/21  4:00 PM  Result Value Ref Range   Hepatitis B Surface Ag NON REACTIVE NON REACTIVE    Comment: Performed at [facility] [address] [place], [place] [address]  Magnesium     Status: None   Collection Time: 03/19/21  1:02 AM  Result Value Ref Range   Magnesium 2.0 1.7 - 2.4 mg/dL    Comment: Performed at [facility] [address] [place], [place] [address]  Comprehensive metabolic panel     Status: Abnormal   Collection Time: 03/19/21  1:02 AM  Result Value Ref Range   Sodium 135 135 - 145 mmol/L   Potassium 3.6 3.5 - 5.1 mmol/L   Chloride 99 98 - 111 mmol/L   CO2 27 22 - 32 mmol/L   Glucose, Bld 76 70 - 99 mg/dL    Comment: Glucose reference range applies only to samples taken after fasting for at least 8 hours.   BUN 17 8 - 23 mg/dL   Creatinine, Ser 2.48 (H) 0.44 -  1.00 mg/dL   Calcium 8.5 (L) 8.9 - 10.3 mg/dL   Total Protein 5.3 (L) 6.5 - 8.1 g/dL   Albumin 2.0 (L) 3.5 - 5.0 g/dL   AST 14 (L) 15 - 41 U/L   ALT 11 0 - 44 U/L   Alkaline Phosphatase 67 38 - 126 U/L   Total Bilirubin 0.7 0.3 - 1.2 mg/dL   GFR, Estimated 20 (L) >60 mL/min    Comment: (NOTE) Calculated using the CKD-EPI Creatinine Equation (2021)    Anion gap 9 5 - 15    Comment: Performed at [facility], [place] [address] [place], [place] [address]  CBC     Status: Abnormal   Collection Time: 03/19/21  1:02 AM  Result Value Ref Range   WBC 15.7 (H) 4.0 - 10.5 K/uL   RBC 2.17 (L) 3.87 - 5.11 MIL/uL   Hemoglobin 7.0 (L) 12.0 - 15.0 g/dL   HCT 21.9 (L) 36.0 - 46.0 %   MCV 100.9 (H) 80.0 - 100.0 fL   MCH 32.3 26.0 - 34.0 pg   MCHC 32.0 30.0 - 36.0 g/dL   RDW 13.3 11.5 - 15.5 %   Platelets 167 150 - 400 K/uL   nRBC 0.0 0.0 - 0.2 %    Comment: Performed at [facility], [place] [address], [place], [place] [address]  Hemoglobin and hematocrit, blood     Status: Abnormal   Collection Time: 03/19/21  6:35 AM  Result Value Ref Range   Hemoglobin 7.6 (L) 12.0 - 15.0 g/dL   HCT 23.9 (L) 36.0 - 46.0 %    Comment: Performed at [facility], [place] [address], [place], [place] [address]  Type and screen [place]     Status: None (Preliminary result)   Collection Time: 03/19/21  6:45 AM  Result Value Ref Range   ABO/RH(D) O POS    Antibody Screen POS    Sample Expiration 03/22/2021,2359    Unit Number [id number]    Blood Component Type RBC LR PHER1    Unit division 00    Status of Unit REL FROM [facility]    Donor AG Type NEGATIVE FOR KELL ANTIGEN NEGATIVE FOR E ANTIGEN    Transfusion Status OK TO TRANSFUSE    Crossmatch Result COMPATIBLE  Unit Number [id number]    Blood Component Type RED CELLS,LR    Unit division 00    Status of Unit ALLOCATED    Donor AG Type NEGATIVE FOR KELL ANTIGEN NEGATIVE FOR E ANTIGEN    Transfusion Status OK TO  TRANSFUSE    Crossmatch Result COMPATIBLE   Lactic acid, plasma     Status: None   Collection Time: 03/19/21  3:18 PM  Result Value Ref Range   Lactic Acid, Venous 1.2 0.5 - 1.9 mmol/L    Comment: Performed at [facility], [place] [address] [place], [place] [address]  Hemoglobin and hematocrit, blood     Status: Abnormal   Collection Time: 03/19/21  3:18 PM  Result Value Ref Range   Hemoglobin 8.2 (L) 12.0 - 15.0 g/dL   HCT 25.4 (L) 36.0 - 46.0 %    Comment: Performed at [facility], [place] [address] [place], [place] [address]  Hemoglobin and hematocrit, blood     Status: Abnormal   Collection Time: 03/19/21  6:46 PM  Result Value Ref Range   Hemoglobin 8.0 (L) 12.0 - 15.0 g/dL   HCT 25.3 (L) 36.0 - 46.0 %    Comment: Performed at [facility], [place] [address] [place], [place] [address]  Comprehensive metabolic panel     Status: Abnormal   Collection Time: 03/20/21 12:59 AM  Result Value Ref Range   Sodium 133 (L) 135 - 145 mmol/L   Potassium 3.8 3.5 - 5.1 mmol/L   Chloride 97 (L) 98 - 111 mmol/L   CO2 24 22 - 32 mmol/L   Glucose, Bld 43 (LL) 70 - 99 mg/dL    Comment: Glucose reference range applies only to samples taken after fasting for at least 8 hours. CRITICAL RESULT CALLED TO, READ BACK BY AND VERIFIED WITH:  AWeber Cooks RN @0238  03/20/2021 K. SANDERS    BUN 28 (H) 8 - 23 mg/dL   Creatinine, Ser 3.65 (H) 0.44 - 1.00 mg/dL   Calcium 8.8 (L) 8.9 - 10.3 mg/dL   Total Protein 6.1 (L) 6.5 - 8.1 g/dL   Albumin 2.2 (L) 3.5 - 5.0 g/dL   AST 14 (L) 15 - 41 U/L   ALT 11 0 - 44 U/L   Alkaline Phosphatase 73 38 - 126 U/L   Total Bilirubin 1.3 (H) 0.3 - 1.2 mg/dL   GFR, Estimated 13 (L) >60 mL/min    Comment: (NOTE) Calculated using the CKD-EPI Creatinine Equation (2021)    Anion gap 12 5 - 15    Comment: Performed at [facility], [place] [address] [place], [place] [address]  CBC     Status: Abnormal   Collection Time: 03/20/21 12:59 AM  Result Value Ref  Range   WBC 19.2 (H) 4.0 - 10.5 K/uL   RBC 2.58 (L) 3.87 - 5.11 MIL/uL   Hemoglobin 8.1 (L) 12.0 - 15.0 g/dL   HCT 25.7 (L) 36.0 - 46.0 %   MCV 99.6 80.0 - 100.0 fL   MCH 31.4 26.0 - 34.0 pg   MCHC 31.5 30.0 - 36.0 g/dL   RDW 13.2 11.5 - 15.5 %   Platelets 212 150 - 400 K/uL   nRBC 0.0 0.0 - 0.2 %    Comment: Performed at [facility], [place] [address] [place], [place] [address]  Glucose, capillary     Status: Abnormal   Collection Time: 03/20/21  2:41 AM  Result Value Ref Range  Glucose-Capillary 44 (LL) 70 - 99 mg/dL    Comment: Glucose reference range applies only to samples taken after fasting for at least 8 hours.   Comment 1 Document in Chart   Glucose, capillary     Status: Abnormal   Collection Time: 03/20/21  3:17 AM  Result Value Ref Range   Glucose-Capillary 136 (H) 70 - 99 mg/dL    Comment: Glucose reference range applies only to samples taken after fasting for at least 8 hours.   CT ABDOMEN PELVIS W CONTRAST  Result Date: 03/19/2021 CLINICAL DATA:  Suspected abdominal abscess. EXAM: CT ABDOMEN AND PELVIS WITH CONTRAST TECHNIQUE: Multidetector CT imaging of the abdomen and pelvis was performed using the standard protocol following bolus administration of intravenous contrast. CONTRAST:  84mL OMNIPAQUE IOHEXOL 350 MG/ML SOLN COMPARISON:  None. FINDINGS: Lower chest: Basilar atelectasis bilaterally with small bilateral pleural effusions. Hepatobiliary: Small hepatic cysts with periportal edema and intrahepatic biliary duct dilatation, similar to prior. Gallbladder is massively distended as before. Common bile duct measures up to 12 mm diameter, stable. Pancreas: No focal mass lesion. No dilatation of the main duct. No intraparenchymal cyst. No peripancreatic edema. Spleen: Tiny hypodensity in the medial spleen is similar to prior. Adrenals/Urinary Tract: Stable appearance of the multicystic kidneys bilaterally with dilated left intrarenal collecting system and proximal left  ureter down to the level of the previously described retroperitoneal clip. Bladder is nondistended. Transplant kidney noted left pelvis. Stomach/Bowel: Stomach is decompressed. Large duodenal diverticulum noted small bowel is clustered in the anterior abdomen due to the renal enlargement and lack of intra-abdominal fat. The 5.0 x 4.3 cm rim enhancing collection of gas and debris seen previously is not evident on the current study although there is a small 2 x 1.2 cm fluid density structure at this location on image 55/3. Multiple additional round collections of gas and debris are again noted in the mesentery, clearly seen to represent small bowel diverticuli on today's study, 1 of which has become opacified (axial 57/3 and coronal 30/6). Colon is nondistended with features suggesting diffuse diverticular disease. Vascular/Lymphatic: There is abdominal aortic atherosclerosis without aneurysm. Right external iliac vein stent again noted. There is no gastrohepatic or hepatoduodenal ligament lymphadenopathy. No retroperitoneal or mesenteric lymphadenopathy. No pelvic sidewall lymphadenopathy. Right groin dialysis fistula has been incompletely visualized. Reproductive: Calcified fibroids.  There is no adnexal mass. Other: Diffuse body wall and mesenteric edema. Musculoskeletal: No worrisome lytic or sclerotic osseous abnormality. IMPRESSION: 1. The 5.0 x 4.3 cm rim enhancing collection of gas and debris seen previously is not evident on the current study although there is a small 2 x 1.2 cm fluid density structure at this location. Presumably this was a large irregular patulous diverticulum that has decompressed in the interval or dilated irregular small bowel loop. 2. Multiple additional round collections of gas and debris are again noted in the mesentery, clearly seen to represent small bowel diverticuli on today's study. 3. Massive distention of the gallbladder with periportal edema and biliary duct dilatation,  similar to prior. 4. Stable appearance of the multicystic kidneys bilaterally with dilated left intrarenal collecting system and proximal left ureter down to the level of the previously described retroperitoneal clip. 5. Diffuse body wall and mesenteric edema with small bilateral pleural effusions. 6. Aortic Atherosclerosis (ICD10-I70.0). Electronically Signed   By: Misty Stanley M.D.   On: 03/19/2021 14:59   CT ABDOMEN PELVIS W CONTRAST  Result Date: 03/18/2021 CLINICAL DATA:  Bloody stools with abdominal pain. EXAM: CT  ABDOMEN AND PELVIS WITH CONTRAST TECHNIQUE: Multidetector CT imaging of the abdomen and pelvis was performed using the standard protocol following bolus administration of intravenous contrast. CONTRAST:  18mL OMNIPAQUE IOHEXOL 300 MG/ML  SOLN COMPARISON:  02/20/2021. FINDINGS: Lower chest: Heart is enlarged. Dependent atelectasis noted in the lung bases. Hepatobiliary: Tiny hypodensities in the right liver are stable since prior and also comparing back to 05/09/2020 suggesting benign etiology. Mild intra and extrahepatic biliary duct dilatation again noted with some associated periportal edema today. Common bile duct measures 12 mm diameter, increased from 8 mm on the 05/09/2020 exam. Gallbladder is markedly distended. Pancreas: Mild prominence of the ventral pancreatic duct. Dorsal duct is not well seen in the head of the pancreas but appears nondilated in the body and tail. No discrete pancreatic mass lesion evident. Spleen: No splenomegaly. No focal mass lesion. Adrenals/Urinary Tract: Marked bilateral polycystic kidney disease noted with numerous calcifications/stones, similar to prior. As noted previously, there is fairly marked left-sided hydroureteronephrosis with collecting system and proximal ureter filled with high attenuation material. Left ureter remains dilated down to the level of the iliac crests to a point immediately adjacent to a surgical clip. Bladder is decompressed.  Stomach/Bowel: Stomach is decompressed. No small bowel dilatation. Assessment of bowel limited by lack of mesenteric fat. A 5.0 x 4.3 x 3.1 cm collection of gas and debris is identified in the anterior central mesentery of the pelvis (see axial image 60/series 2 and sagittal image 72 of series 6). This shows an irregular rim of peripheral enhancement different than enhancement seen in bowel wall. Imaging features are very suspicious for mesenteric abscess, also visible on coronal image 47 where there may be a second smaller adjacent component visible on 46/5. This finding is not immediately adjacent to the left colon and etiology is uncertain although small bowel perforation would be a consideration. Several other small circular areas of gas and debris are seen in the lower small bowel mesentery, indeterminate. Vascular/Lymphatic: There is abdominal aortic atherosclerosis without aneurysm. Right external iliac vein stent device noted. Pseudoaneurysm noted in the arterial limb laterally with a second relatively large pseudoaneurysm near the venous and of the graft more medially. There is no gastrohepatic or hepatoduodenal ligament lymphadenopathy. No retroperitoneal or mesenteric lymphadenopathy. No pelvic sidewall lymphadenopathy. Reproductive: Calcified fibroids are noted in the uterus There is no adnexal mass. Other: Small to moderate volume free fluid noted with diffuse body wall edema. Musculoskeletal: No worrisome lytic or sclerotic osseous abnormality. Advanced degenerative changes noted at L4-5 level. IMPRESSION: 1. 5.0 x 4.3 x 3.1 cm collection of gas and debris in the anterior central mesentery of the pelvis with an irregular rim of peripheral enhancement different than enhancement seen in bowel wall elsewhere. Although assessment is hindered by lack of mesenteric fat and bowel opacification, imaging features are very suspicious for mesenteric abscess. This finding is not immediately adjacent to the left  colon and etiology is uncertain although small bowel perforation would be a consideration. 2. Several other small circular areas of gas and debris are seen in the lower small bowel mesentery, indeterminate. 3. Marked bilateral polycystic kidney disease with numerous calcifications/stones, similar to prior. There is fairly marked left hydroureteronephrosis with high attenuation material in the collecting system and proximal ureter, similar to prior. This may represent blood, infectious debris, or soft tissue/neoplasm. Ureteral obstruction is in the mid ureter at or immediately adjacent to a surgical clip. 4. Right groin dialysis graft with pseudoaneurysm formation. 5. Small to moderate volume free  fluid with diffuse body wall edema. 6. Mild intra and extrahepatic biliary duct dilatation with some associated periportal edema. Common bile duct measures 12 mm diameter, increased from 8 mm on the 05/09/2020 exam. Correlation with liver function test may prove helpful. Gallbladder is markedly distended. 7. Aortic Atherosclerosis (ICD10-I70.0). Electronically Signed   By: Misty Stanley M.D.   On: 03/18/2021 10:42   US Abdomen Limited  Result Date: 03/18/2021 CLINICAL DATA:  Periumbilical pain for 6 weeks EXAM: ULTRASOUND ABDOMEN LIMITED RIGHT UPPER QUADRANT COMPARISON:  CT abdomen 05/09/2020, 02/20/2021, 03/19/2019 FINDINGS: Gallbladder: Distended gallbladder. No cholelithiasis. Tumefactive sludge within the gallbladder. In negative sonographic Murphy sign. Mild gallbladder wall thickening measuring up to 4 mm. Common bile duct: Diameter: 12 mm.  No choledocholithiasis. Liver: No focal lesion identified. Increased hepatic parenchymal echogenicity. Portal vein is patent on color Doppler imaging with normal direction of blood flow towards the liver. Other: Polycystic right kidney. Small right pleural effusion. Small volume ascites. IMPRESSION: 1. Hydropic gallbladder with tumefactive sludge. No definite sonographic  findings to suggest acute cholecystitis. 2. Dilated common bile duct of uncertain etiology. Electronically Signed   By: Kathreen Devoid   On: 03/18/2021 12:50      Assessment/Plan Acute on chronic blood loss anemia ESRD on HD TTS  Concern for mesenteric abscess on prior imaging from [facility] - repeat CT 5/1 now without enhancing collection, findings of fluid density consistent with patulous diverticulum Question mesenteric ischemia - continue empiric abx - recommend consult to GI in regard to bloody stools  Will discuss further with MD  FEN: remain NPO ID: [email or web address], vancomycin VTE: [place], [facility] 03/20/2021, 8:20 AM Please see Amion for pager number during day hours 7:00am-4:30pm

## 2021-03-20 NOTE — Progress Notes (Signed)
   Request for IR drain placement  CT yesterday: IMPRESSION: 1. The 5.0 x 4.3 cm rim enhancing collection of gas and debris seen previously is not evident on the current study although there is a small 2 x 1.2 cm fluid density structure at this location. Presumably this was a large irregular patulous diverticulum that has decompressed in the interval or dilated irregular small bowel loop. 2. Multiple additional round collections of gas and debris are again noted in the mesentery, clearly seen to represent small bowel diverticuli on today's study. 3. Massive distention of the gallbladder with periportal edema and biliary duct dilatation, similar to prior. 4. Stable appearance of the multicystic kidneys bilaterally with dilated left intrarenal collecting system and proximal left ureter down to the level of the previously described retroperitoneal clip. 5. Diffuse body wall and mesenteric edema with small bilateral pleural effusions.  Images were reviewed with Dr Anselm Pancoast yesterday No abcsesses noted to merit any drain placement Will cancel order

## 2021-03-20 NOTE — Progress Notes (Signed)
Pharmacy Antibiotic Note  [name] is a 70 y.o. female admitted on 03/17/2021 with sepsis & abscess.  Pharmacy has been consulted for zosyn and vancomycin dosing.  WBC 19, afebrile. LA 2>1.2. Hx ESRD. CT ab yesterday not showing previous abscess - no plans for drain placement per IR notes. On day #3 of abx.   Plan: Vancomycin 500mg  qTThS dialysis  Zosyn 2.25 g IV every 8 hours Monitor HD plans, cx results, clinical pic, and LOT   Height: 5\' 4"  (162.6 cm) Weight: 54.7 kg (120 lb 9.5 oz) IBW/kg (Calculated) : 54.7  Temp (24hrs), Avg:98 F (36.7 C), Min:97.8 F (36.6 C), Max:98.1 F (36.7 C)  Recent Labs  Lab 03/18/21 0245 03/18/21 0457 03/19/21 0102 03/19/21 1518 03/20/21 0059  WBC 20.3*  --  15.7*  --  19.2*  CREATININE 4.05*  --  2.48*  --  3.65*  LATICACIDVEN  --  2.0*  --  1.2  --     Estimated Creatinine Clearance: 12.2 mL/min (A) (by C-G formula based on SCr of 3.65 mg/dL (H)).    Allergies  Allergen Reactions  . Dilaudid [Hydromorphone] Nausea And Vomiting  . Dilaudid  [Hydromorphone Hcl]   . Tape Rash    Plastic tape    Antimicrobials this admission: Vancomycin 4/30 >>  Zosyn 4/30>>   Microbiology results: 4/30 MRSA PCR: negative  Thank you for allowing pharmacy to be a part of this patient's care.  Antonietta Jewel, PharmD, [place] Clinical Pharmacist  Phone: [phone] 03/20/2021 9:42 AM  Please check AMION for all [place] phone numbers After 10:00 PM, call [place] [phone]

## 2021-03-20 NOTE — Consult Note (Incomplete)
[facility] Consult: 4:53 PM 03/20/2021  LOS: 2 days    Referring Provider: ***  Primary Care Physician:  Glenda Chroman, MD Primary Gastroenterologist:  Dr. Marland Kitchen     Reason for Consultation:  ***   HPI: [name] is a 70 y.o. female.  ***  Currently inpatient to address mesenteric abscess. Transfer from [facility] to [facility] for abdominal distention, diffuse abdominal pain, black, tarry stools, anorexia.  Actually had not had a bowel movement for about a week but reports tarry stools.  In the ED Hgb 9.5.   CT abdomen pelvis: 5 x 4.3 x 3.1 cm collection of gas and debris in the anterior central mesentery of pelvis.  Suspicious for mesenteric abscess.  Several other areas of gas and debris in the lower small bowel mesentery are indeterminate.  Pseudoaneurysm in the right groin dialysis graft.  Mild intra and extrahepatic biliary duct dilatation with some periportal edema.  CBD 12 mm, was 8 on imaging in June 2021.  Gallbladder markedly distended.   A repeat CT scan at [facility] 03/19/2021.  5 x 4.3 cm collection of gas and debris is not evident on the current study.  However at this location is a 2 x 1.2 fluid density.  Presumably this was a large irregular patulous diverticulum that is decompressed in the interval or a dilated irregular small bowel loop.  Additional collections of gas and debris noted in the mesentery clearly represent small bowel diverticuli.  Massive distention of gallbladder with periportal edema and biliary duct dilatation.  Diffuse body wall and mesenteric edema, small bilateral pleural effusions.  Hgb 7 >> 8.1.  Was 9.5 on 4/30. MCV 100.  Normal platelets.  Normal INR     Past Medical History:  Diagnosis Date  . Chronic systolic heart failure ([place])   . Coronary  atherosclerosis of native coronary artery   . Diseases of tricuspid valve   . End stage renal disease ([place])    dialysis T,Th & Sat  . Mitral valve insufficiency and aortic valve insufficiency   . Other and unspecified hyperlipidemia   . Secondary cardiomyopathy, unspecified   . Unspecified essential hypertension     Past Surgical History:  Procedure Laterality Date  . A/V SHUNTOGRAM N/A 08/10/2019   Procedure: A/V SHUNTOGRAM;  Surgeon: Algernon Huxley, MD;  Location: [facility];  Service: Cardiovascular;  Laterality: N/A;  . AV FISTULA PLACEMENT Right 2012  . AV FISTULA PLACEMENT Left 2004  . CATARACT EXTRACTION W/PHACO Left 08/07/2019   Procedure: CATARACT EXTRACTION PHACO AND INTRAOCULAR LENS PLACEMENT LEFT EYE;  Surgeon: [name], MD;  Location: [facility];  Service: Ophthalmology;  Laterality: Left;  left  . CATARACT EXTRACTION W/PHACO Right 08/21/2019   Procedure: CATARACT EXTRACTION PHACO AND INTRAOCULAR LENS PLACEMENT RIGHT EYE (CDE: 5.45);  Surgeon: [name], MD;  Location: [facility];  Service: Ophthalmology;  Laterality: Right;  . INSERTION OF DIALYSIS CATHETER N/A 01/08/2013   Procedure: INSERTION OF DIALYSIS CATHETER;  Surgeon: Angelia Mould, MD;  Location: [facility];  Service:  Vascular;  Laterality: N/A;  . KIDNEY TRANSPLANT Left 2002   pt. states transplant lasted 2 yrs.  Marland Kitchen PERIPHERAL VASCULAR CATHETERIZATION Right 08/27/2016   Procedure: A/V Shuntogram/Fistulagram;  Surgeon: Algernon Huxley, MD;  Location: [facility];  Service: Cardiovascular;  Laterality: Right;    Prior to Admission medications   Medication Sig Start Date End Date Taking? Authorizing Provider  acetaminophen (TYLENOL) 500 MG tablet Take 1,000 mg by mouth every 6 (six) hours as needed for pain (headache).   Yes [provider]  aspirin 81 MG tablet Take 81 mg by mouth daily.   Yes [provider]  B Complex-C-Folic Acid (RENAL) 1 MG CAPS Take 1 mg by mouth daily.   Yes  [provider]  cycloSPORINE (RESTASIS) 0.05 % ophthalmic emulsion Place 1 drop into both eyes 2 (two) times daily.   Yes [provider]  latanoprost (XALATAN) 0.005 % ophthalmic solution Place 1 drop into both eyes at bedtime.   Yes [provider]  B Complex-C-Zn-Folic Acid (NEPHPLEX RX PO) Take 1 tablet by mouth daily.    [provider]  cinacalcet (SENSIPAR) 30 MG tablet Take 30 mg by mouth daily after supper.    [provider]  docusate sodium (COLACE) 100 MG capsule Take 100 mg by mouth daily. 10/28/13   [provider]  isosorbide mononitrate (IMDUR) 30 MG 24 hr tablet Take 30 mg by mouth daily.    [provider]  labetalol (NORMODYNE) 200 MG tablet Take 100 mg by mouth 2 (two) times daily. 06/22/20   [provider]  minoxidil (LONITEN) 2.5 MG tablet Take 2.5 mg by mouth 2 (two) times daily.    [provider]  omeprazole (PRILOSEC) 40 MG capsule Take 40 mg by mouth daily. 10/23/17   [provider]  sevelamer carbonate (RENVELA) 800 MG tablet Take 800-2,400 mg by mouth 3 (three) times daily with meals. 800 mg with snacks. 2,400 mg with meals    [provider]  simvastatin (ZOCOR) 20 MG tablet Take 20 mg by mouth daily.    [provider]  traMADol (ULTRAM) 50 MG tablet Take 50 mg by mouth daily as needed for moderate pain.    [provider]    Scheduled Meds: . Chlorhexidine Gluconate Cloth  6 each Topical Q0600  . cycloSPORINE  1 drop Both Eyes BID  . hydrALAZINE  10 mg Intravenous Q6H  . latanoprost  1 drop Both Eyes QHS  . pantoprazole (PROTONIX) IV  40 mg Intravenous Q12H  . sodium chloride flush  3 mL Intravenous Q12H  . [START ON 03/21/2021] vancomycin  500 mg Intravenous Q T,Th,Sa-HD   Infusions: . sodium chloride    . sodium chloride    . piperacillin-tazobactam (ZOSYN)  IV 2.25 g (03/20/21 1504)   PRN Meds: sodium chloride, acetaminophen **OR**  acetaminophen, HYDROmorphone (DILAUDID) injection, labetalol, ondansetron **OR** ondansetron (ZOFRAN) IV, sodium chloride flush   Allergies as of 03/17/2021 - Review Complete 03/17/2021  Allergen Reaction Noted  . Dilaudid [hydromorphone] Nausea And Vomiting 03/21/2015  . Dilaudid  [hydromorphone hcl]  08/03/2019  . Tape Rash 01/07/2013    No family history on file.  Social History   Socioeconomic History  . Marital status: Single    Spouse name: Not on file  . Number of children: Not on file  . Years of education: Not on file  . Highest education level: Not on file  Occupational History  . Not on file  Tobacco Use  . Smoking status: Never Smoker  . Smokeless tobacco: Never Used  Vaping Use  . Vaping Use: Never used  Substance and Sexual Activity  . Alcohol use: No  . Drug use: No  . Sexual activity: Not on file  Other Topics Concern  . Not on file  Social History Narrative  . Not on file   Social Determinants of Health   Financial Resource Strain: Not on file  Food Insecurity: Not on file  Transportation Needs: Not on file  Physical Activity: Not on file  Stress: Not on file  Social Connections: Not on file  Intimate Partner Violence: Not on file    REVIEW OF SYSTEMS: Constitutional:  *** ENT:  No nose bleeds Pulm:  *** CV:  No palpitations, no LE edema.  GU:  No hematuria, no frequency GI:  *** Heme:  ***   Transfusions:  *** Neuro:  No headaches, no peripheral tingling or numbness Derm:  No itching, no rash or sores.  Endocrine:  No sweats or chills.  No polyuria or dysuria Immunization:  *** Travel:  None beyond local counties in last few months.    PHYSICAL EXAM: Vital signs in last 24 hours: Vitals:   03/20/21 0531 03/20/21 1459  BP: (!) 157/57 (!) 154/59  Pulse: 63 71  Resp: 16 17  Temp: 97.8 F (36.6 C) 98.3 F (36.8 C)  SpO2: 98% 100%   Wt Readings from Last 3 Encounters:  03/18/21 54.7 kg  09/21/20 55.8 kg  03/23/20 58.1 kg     General: *** Head:  ***  Eyes:  *** Ears:  ***  Nose:  *** Mouth:  *** Neck:  *** Lungs:  *** Heart: *** Abdomen:  ***.   Rectal: ***   Musc/Skeltl: *** Extremities:  ***  Neurologic:  *** Skin:  *** Tattoos:  *** Nodes:  ***   Psych:  ***  Intake/Output from previous day: 05/01 0701 - 05/02 0700 In: 950 [P.O.:950] Out: -  Intake/Output this shift: No intake/output data recorded.  LAB RESULTS: Recent Labs    03/18/21 0245 03/19/21 0102 03/19/21 0635 03/19/21 1518 03/19/21 1846 03/20/21 0059  WBC 20.3* 15.7*  --   --   --  19.2*  HGB 9.5* 7.0*   < > 8.2* 8.0* 8.1*  HCT 30.2* 21.9*   < > 25.4* 25.3* 25.7*  PLT 218 167  --   --   --  212   < > = values in this interval not displayed.   BMET Lab Results  Component Value Date   NA 133 (L) 03/20/2021   NA 135 03/19/2021   NA 134 (L) 03/18/2021   K 3.8 03/20/2021   K 3.6 03/19/2021   K 3.5 03/18/2021   CL 97 (L) 03/20/2021   CL 99 03/19/2021   CL 96 (L) 03/18/2021   CO2 24 03/20/2021   CO2 27 03/19/2021   CO2 27 03/18/2021   GLUCOSE 43 (LL) 03/20/2021   GLUCOSE 76 03/19/2021   GLUCOSE 118 (H) 03/18/2021   BUN 28 (H) 03/20/2021   BUN 17 03/19/2021   BUN 33 (H) 03/18/2021   CREATININE 3.65 (H) 03/20/2021   CREATININE 2.48 (H) 03/19/2021   CREATININE 4.05 (H) 03/18/2021   CALCIUM 8.8 (L) 03/20/2021   CALCIUM 8.5 (L) 03/19/2021   CALCIUM 9.3 03/18/2021   LFT Recent Labs    03/18/21 0245 03/19/21 0102 03/20/21 0059  PROT 6.0* 5.3* 6.1*  ALBUMIN 2.5* 2.0* 2.2*  AST 18 14*  14*  ALT 13 11 11   ALKPHOS 73 67 73  BILITOT 0.6 0.7 1.3*   PT/INR Lab Results  Component Value Date   INR 1.2 03/18/2021   Hepatitis Panel Recent Labs    03/18/21 1600  HEPBSAG NON REACTIVE   C-Diff No components found for: CDIFF Lipase     Component Value Date/Time   LIPASE 28 02/20/2021 0705    Drugs of Abuse  No results found for: LABOPIA, COCAINSCRNUR, LABBENZ, AMPHETMU, THCU, LABBARB   RADIOLOGY  STUDIES: CT ABDOMEN PELVIS W CONTRAST  Result Date: 03/19/2021 CLINICAL DATA:  Suspected abdominal abscess. EXAM: CT ABDOMEN AND PELVIS WITH CONTRAST TECHNIQUE: Multidetector CT imaging of the abdomen and pelvis was performed using the standard protocol following bolus administration of intravenous contrast. CONTRAST:  33mL OMNIPAQUE IOHEXOL 350 MG/ML SOLN COMPARISON:  None. FINDINGS: Lower chest: Basilar atelectasis bilaterally with small bilateral pleural effusions. Hepatobiliary: Small hepatic cysts with periportal edema and intrahepatic biliary duct dilatation, similar to prior. Gallbladder is massively distended as before. Common bile duct measures up to 12 mm diameter, stable. Pancreas: No focal mass lesion. No dilatation of the main duct. No intraparenchymal cyst. No peripancreatic edema. Spleen: Tiny hypodensity in the medial spleen is similar to prior. Adrenals/Urinary Tract: Stable appearance of the multicystic kidneys bilaterally with dilated left intrarenal collecting system and proximal left ureter down to the level of the previously described retroperitoneal clip. Bladder is nondistended. Transplant kidney noted left pelvis. Stomach/Bowel: Stomach is decompressed. Large duodenal diverticulum noted small bowel is clustered in the anterior abdomen due to the renal enlargement and lack of intra-abdominal fat. The 5.0 x 4.3 cm rim enhancing collection of gas and debris seen previously is not evident on the current study although there is a small 2 x 1.2 cm fluid density structure at this location on image 55/3. Multiple additional round collections of gas and debris are again noted in the mesentery, clearly seen to represent small bowel diverticuli on today's study, 1 of which has become opacified (axial 57/3 and coronal 30/6). Colon is nondistended with features suggesting diffuse diverticular disease. Vascular/Lymphatic: There is abdominal aortic atherosclerosis without aneurysm. Right external iliac  vein stent again noted. There is no gastrohepatic or hepatoduodenal ligament lymphadenopathy. No retroperitoneal or mesenteric lymphadenopathy. No pelvic sidewall lymphadenopathy. Right groin dialysis fistula has been incompletely visualized. Reproductive: Calcified fibroids.  There is no adnexal mass. Other: Diffuse body wall and mesenteric edema. Musculoskeletal: No worrisome lytic or sclerotic osseous abnormality. IMPRESSION: 1. The 5.0 x 4.3 cm rim enhancing collection of gas and debris seen previously is not evident on the current study although there is a small 2 x 1.2 cm fluid density structure at this location. Presumably this was a large irregular patulous diverticulum that has decompressed in the interval or dilated irregular small bowel loop. 2. Multiple additional round collections of gas and debris are again noted in the mesentery, clearly seen to represent small bowel diverticuli on today's study. 3. Massive distention of the gallbladder with periportal edema and biliary duct dilatation, similar to prior. 4. Stable appearance of the multicystic kidneys bilaterally with dilated left intrarenal collecting system and proximal left ureter down to the level of the previously described retroperitoneal clip. 5. Diffuse body wall and mesenteric edema with small bilateral pleural effusions. 6. Aortic Atherosclerosis (ICD10-I70.0). Electronically Signed   By: Misty Stanley M.D.   On: 03/19/2021 14:59    ENDOSCOPIC STUDIES: ***  IMPRESSION:   *    Small bowel diverticuli. *  Chronic anemia.  *    ESRD.  TTS hemodialysis.    PLAN:     ***   Azucena Freed  03/20/2021, 4:53 PM Phone [phone]

## 2021-03-20 NOTE — Progress Notes (Addendum)
PROGRESS NOTE  [name] JZP:[id number] DOB: 1950-12-31 DOA: 03/17/2021 PCP: Glenda Chroman, MD   LOS: 2 days   Brief narrative: Brittany Rydberg [name] a 70 y.o.femalewith medical history significant forESRD on HD Tuesday Thursday Saturday, essential hypertension, dyslipidemia, chronic anemia and GERD presented to hospital from [facility] with worsening abdominal distention and pain with black tarry stools for 2 weeks with impaired appetite as well.  In the ED patient had elevated white count at 20.3 and creatinine at 44 ESRD.  He was given vancomycin and Zosyn initially.  CT scan of the abdomen pelvis showed some large collection of fluid and gas and debris's consistent with mesenteric abscess likely related to small bowel perforation.  Patient was then admitted to hospital for further evaluation and treatment.  Subsequently, IR was consulted for possible IR guided drain but there was no drainable collection noted.  Surgery on board.  Will follow recommendation.   Assessment/Plan:  Principal Problem:   Mesenteric abscess (HCC) Active Problems:   Hyperlipidemia   Mitral valve regurgitation   Essential hypertension   Coronary artery disease   SYSTOLIC HEART FAILURE, CHRONIC   End stage renal disease (HCC)   Perforation bowel (HCC)   Suspected mesenteric abscess with possible associated bowel perforation General surgery and IR on board.  IR consulted for drain placement but has not been placed due to no drainable collection.  Continue empiric antibiotic.  Follow surgical recommendations.  Has been advanced to clears as per surgery today.   Possible GI bleed with Anemia of chronic renal disease  Patient had hemoglobin around 10.6, 3 weeks prior to admission.  Hemoglobin dropped to 7.0.  Will transfuse as necessary for hemoglobin less than 7.   Possible GI bleed. Black Tarry stools for 3 weeks.  We will add Protonix IV.  Consult GI.  Spoke with general surgery who recommended GI  consultation at this time.  ESRD on HD Tuesday, Thursday Saturday - Nephology on board for hemodialysis.  Essential hypertension-on hydralazine IV, labetalol IV as needed as needed.   Closely monitor blood pressure  Dyslipidemia -  Statins is on hold.  CAD -continue statin.  Imdur on hold. Aspirin on hold.  Debility, weakness.  Will get PT evaluation.  DVT prophylaxis: SCDs Start: 03/18/21 1207   Code Status: Full code  Family Communication: None.  Status is: Inpatient  Remains inpatient appropriate because:IV treatments appropriate due to intensity of illness or inability to take PO and Inpatient level of care appropriate due to severity of illness   Dispo: The patient is from: Home              Anticipated d/c is to: Home              Patient currently is not medically stable to d/c.   Difficult to place patient No  Consultants:  General surgery  Intervention radiology  Nephrology  Will consult GI  Procedures:  Hemodialysis  Anti-infectives:  Marland Kitchen Vancomycin and Zosyn IV  Anti-infectives (From admission, onward)   Start     Dose/Rate Route Frequency Ordered Stop   03/21/21 1200  vancomycin (VANCOCIN) IVPB 500 mg/100 ml premix        500 mg 100 mL/hr over 60 Minutes Intravenous Every T-Th-Sa (Hemodialysis) 03/18/21 1229     03/20/21 1045  piperacillin-tazobactam (ZOSYN) 2.25 g in sodium chloride 0.9 % 50 mL IVPB        2.25 g 100 mL/hr over 30 Minutes Intravenous Every 8 hours  03/20/21 0945     03/18/21 1814  vancomycin (VANCOCIN) 1-5 GM/200ML-% IVPB       Note to Pharmacy: [name]   : cabinet override      03/18/21 1814 03/18/21 1819   03/18/21 1115  vancomycin (VANCOCIN) IVPB 1000 mg/200 mL premix        1,000 mg 200 mL/hr over 60 Minutes Intravenous  Once 03/18/21 1112 03/18/21 1919   03/18/21 1115  piperacillin-tazobactam (ZOSYN) IVPB 3.375 g  Status:  Discontinued        3.375 g 12.5 mL/hr over 240 Minutes Intravenous Every 12 hours  03/18/21 1112 03/20/21 0945      Subjective: Today, patient was seen and examined at bedside.  Patient denies any nausea, vomiting had bowel movement twice yesterday.  Complains of mild abdominal pain.   Objective: Vitals:   03/20/21 0322 03/20/21 0531  BP: (!) 149/53 (!) 157/57  Pulse:  63  Resp:  16  Temp:  97.8 F (36.6 C)  SpO2:  98%    Intake/Output Summary (Last 24 hours) at 03/20/2021 1148 Last data filed at 03/20/2021 3536 Gross per 24 hour  Intake 950 ml  Output --  Net 950 ml   Filed Weights   03/17/21 2348 03/18/21 1600 03/18/21 1945  Weight: 56.7 kg 56.2 kg 54.7 kg   Body mass index is 20.7 kg/m.   Physical Exam: GENERAL: Patient is alert awake and oriented. Not in obvious distress. HENT: No scleral pallor or icterus. Pupils equally reactive to light. Oral mucosa is moist NECK: is supple, no gross swelling noted. CHEST: Clear to auscultation. No crackles or wheezes.  Diminished breath sounds bilaterally. CVS: S1 and S2 heard, no murmur. Regular rate and rhythm.  ABDOMEN: Soft, diffuse tenderness on palpation, bowel sounds are present. EXTREMITIES: No edema.  CNS: Cranial nerves are intact. No focal motor deficits. SKIN: warm and dry without rashes.  Data Review: I have personally reviewed the following laboratory data and studies,  CBC: Recent Labs  Lab 03/18/21 0245 03/19/21 0102 03/19/21 0635 03/19/21 1518 03/19/21 1846 03/20/21 0059  WBC 20.3* 15.7*  --   --   --  19.2*  NEUTROABS 18.2*  --   --   --   --   --   HGB 9.5* 7.0* 7.6* 8.2* 8.0* 8.1*  HCT 30.2* 21.9* 23.9* 25.4* 25.3* 25.7*  MCV 101.3* 100.9*  --   --   --  99.6  PLT 218 167  --   --   --  144   Basic Metabolic Panel: Recent Labs  Lab 03/18/21 0245 03/19/21 0102 03/20/21 0059  NA 134* 135 133*  K 3.5 3.6 3.8  CL 96* 99 97*  CO2 27 27 24   GLUCOSE 118* 76 43*  BUN 33* 17 28*  CREATININE 4.05* 2.48* 3.65*  CALCIUM 9.3 8.5* 8.8*  MG  --  2.0  --    Liver Function  Tests: Recent Labs  Lab 03/18/21 0245 03/19/21 0102 03/20/21 0059  AST 18 14* 14*  ALT 13 11 11   ALKPHOS 73 67 73  BILITOT 0.6 0.7 1.3*  PROT 6.0* 5.3* 6.1*  ALBUMIN 2.5* 2.0* 2.2*   No results for input(s): LIPASE, AMYLASE in the last 168 hours. No results for input(s): AMMONIA in the last 168 hours. Cardiac Enzymes: No results for input(s): CKTOTAL, CKMB, CKMBINDEX, TROPONINI in the last 168 hours. BNP (last 3 results) No results for input(s): BNP in the last 8760 hours.  ProBNP (last 3 results)  No results for input(s): PROBNP in the last 8760 hours.  CBG: Recent Labs  Lab 03/20/21 0241 03/20/21 0317  GLUCAP 44* 136*   Recent Results (from the past 240 hour(s))  Resp Panel by RT-PCR (Flu A&B, Covid) Nasopharyngeal Swab     Status: None   Collection Time: 03/18/21  8:15 AM   Specimen: Nasopharyngeal Swab; Nasopharyngeal(NP) swabs in vial transport medium  Result Value Ref Range Status   SARS Coronavirus 2 by RT PCR NEGATIVE NEGATIVE Final    Comment: (NOTE) SARS-CoV-2 target nucleic acids are NOT DETECTED.  The SARS-CoV-2 RNA is generally detectable in upper respiratory specimens during the acute phase of infection. The lowest concentration of SARS-CoV-2 viral copies this assay can detect is 138 copies/mL. A negative result does not preclude SARS-Cov-2 infection and should not be used as the sole basis for treatment or other patient management decisions. A negative result may occur with  improper specimen collection/handling, submission of specimen other than nasopharyngeal swab, presence of viral mutation(s) within the areas targeted by this assay, and inadequate number of viral copies(<138 copies/mL). A negative result must be combined with clinical observations, patient history, and epidemiological information. The expected result is Negative.  Fact Sheet for Patients:  EntrepreneurPulse.com.au  Fact Sheet for Healthcare Providers:   IncredibleEmployment.be  This test is no t yet approved or cleared by the [place] FDA and  has been authorized for detection and/or diagnosis of SARS-CoV-2 by FDA under an Emergency Use Authorization (EUA). This EUA will remain  in effect (meaning this test can be used) for the duration of the COVID-19 declaration under Section 564(b)(1) of the Act, 21 U.S.C.section 360bbb-3(b)(1), unless the authorization is terminated  or revoked sooner.       Influenza A by PCR NEGATIVE NEGATIVE Final   Influenza B by PCR NEGATIVE NEGATIVE Final    Comment: (NOTE) The Xpert Xpress SARS-CoV-2/FLU/RSV plus assay is intended as an aid in the diagnosis of influenza from Nasopharyngeal swab specimens and should not be used as a sole basis for treatment. Nasal washings and aspirates are unacceptable for Xpert Xpress SARS-CoV-2/FLU/RSV testing.  Fact Sheet for Patients: EntrepreneurPulse.com.au  Fact Sheet for Healthcare Providers: IncredibleEmployment.be  This test is not yet approved or cleared by the [place] FDA and has been authorized for detection and/or diagnosis of SARS-CoV-2 by FDA under an Emergency Use Authorization (EUA). This EUA will remain in effect (meaning this test can be used) for the duration of the COVID-19 declaration under Section 564(b)(1) of the Act, 21 U.S.C. section 360bbb-3(b)(1), unless the authorization is terminated or revoked.  Performed at [facility], [address], [place], [place] [address]      Studies: CT ABDOMEN PELVIS W CONTRAST  Result Date: 03/19/2021 CLINICAL DATA:  Suspected abdominal abscess. EXAM: CT ABDOMEN AND PELVIS WITH CONTRAST TECHNIQUE: Multidetector CT imaging of the abdomen and pelvis was performed using the standard protocol following bolus administration of intravenous contrast. CONTRAST:  74mL OMNIPAQUE IOHEXOL 350 MG/ML SOLN COMPARISON:  None. FINDINGS: Lower chest:  Basilar atelectasis bilaterally with small bilateral pleural effusions. Hepatobiliary: Small hepatic cysts with periportal edema and intrahepatic biliary duct dilatation, similar to prior. Gallbladder is massively distended as before. Common bile duct measures up to 12 mm diameter, stable. Pancreas: No focal mass lesion. No dilatation of the main duct. No intraparenchymal cyst. No peripancreatic edema. Spleen: Tiny hypodensity in the medial spleen is similar to prior. Adrenals/Urinary Tract: Stable appearance of the multicystic kidneys bilaterally with dilated left intrarenal  collecting system and proximal left ureter down to the level of the previously described retroperitoneal clip. Bladder is nondistended. Transplant kidney noted left pelvis. Stomach/Bowel: Stomach is decompressed. Large duodenal diverticulum noted small bowel is clustered in the anterior abdomen due to the renal enlargement and lack of intra-abdominal fat. The 5.0 x 4.3 cm rim enhancing collection of gas and debris seen previously is not evident on the current study although there is a small 2 x 1.2 cm fluid density structure at this location on image 55/3. Multiple additional round collections of gas and debris are again noted in the mesentery, clearly seen to represent small bowel diverticuli on today's study, 1 of which has become opacified (axial 57/3 and coronal 30/6). Colon is nondistended with features suggesting diffuse diverticular disease. Vascular/Lymphatic: There is abdominal aortic atherosclerosis without aneurysm. Right external iliac vein stent again noted. There is no gastrohepatic or hepatoduodenal ligament lymphadenopathy. No retroperitoneal or mesenteric lymphadenopathy. No pelvic sidewall lymphadenopathy. Right groin dialysis fistula has been incompletely visualized. Reproductive: Calcified fibroids.  There is no adnexal mass. Other: Diffuse body wall and mesenteric edema. Musculoskeletal: No worrisome lytic or sclerotic  osseous abnormality. IMPRESSION: 1. The 5.0 x 4.3 cm rim enhancing collection of gas and debris seen previously is not evident on the current study although there is a small 2 x 1.2 cm fluid density structure at this location. Presumably this was a large irregular patulous diverticulum that has decompressed in the interval or dilated irregular small bowel loop. 2. Multiple additional round collections of gas and debris are again noted in the mesentery, clearly seen to represent small bowel diverticuli on today's study. 3. Massive distention of the gallbladder with periportal edema and biliary duct dilatation, similar to prior. 4. Stable appearance of the multicystic kidneys bilaterally with dilated left intrarenal collecting system and proximal left ureter down to the level of the previously described retroperitoneal clip. 5. Diffuse body wall and mesenteric edema with small bilateral pleural effusions. 6. Aortic Atherosclerosis (ICD10-I70.0). Electronically Signed   By: Misty Stanley M.D.   On: 03/19/2021 14:59   US Abdomen Limited  Result Date: 03/18/2021 CLINICAL DATA:  Periumbilical pain for 6 weeks EXAM: ULTRASOUND ABDOMEN LIMITED RIGHT UPPER QUADRANT COMPARISON:  CT abdomen 05/09/2020, 02/20/2021, 03/19/2019 FINDINGS: Gallbladder: Distended gallbladder. No cholelithiasis. Tumefactive sludge within the gallbladder. In negative sonographic Murphy sign. Mild gallbladder wall thickening measuring up to 4 mm. Common bile duct: Diameter: 12 mm.  No choledocholithiasis. Liver: No focal lesion identified. Increased hepatic parenchymal echogenicity. Portal vein is patent on color Doppler imaging with normal direction of blood flow towards the liver. Other: Polycystic right kidney. Small right pleural effusion. Small volume ascites. IMPRESSION: 1. Hydropic gallbladder with tumefactive sludge. No definite sonographic findings to suggest acute cholecystitis. 2. Dilated common bile duct of uncertain etiology.  Electronically Signed   By: Kathreen Devoid   On: 03/18/2021 12:50      Flora Lipps, MD  [facility] 03/20/2021  If 7PM-7AM, please contact night-coverage

## 2021-03-20 NOTE — Progress Notes (Signed)
Date and time results received: 03/20/21 @0239    Test: CBG Critical Value:43  Name of Provider Notified: Dr. Sidney Ace  Orders Received? Or Actions Taken?: To give 1 amp. D50

## 2021-03-20 NOTE — Progress Notes (Signed)
Hypoglycemic Event  CBG:43  Treatment:D50 Symptoms:None  Follow-up CBG: Time:0315 CBG Result:136  Possible Reasons for Event:Patient is NPO Comments/MD notified:Dr. Mansy    Loyalty Arentz, Doreen Salvage

## 2021-03-21 ENCOUNTER — Ambulatory Visit (INDEPENDENT_AMBULATORY_CARE_PROVIDER_SITE_OTHER): Payer: PRIVATE HEALTH INSURANCE | Admitting: Vascular Surgery

## 2021-03-21 ENCOUNTER — Encounter (INDEPENDENT_AMBULATORY_CARE_PROVIDER_SITE_OTHER): Payer: Self-pay | Admitting: Vascular Surgery

## 2021-03-21 ENCOUNTER — Encounter (INDEPENDENT_AMBULATORY_CARE_PROVIDER_SITE_OTHER): Payer: PRIVATE HEALTH INSURANCE

## 2021-03-21 DIAGNOSIS — D72829 Elevated white blood cell count, unspecified: Secondary | ICD-10-CM | POA: Diagnosis not present

## 2021-03-21 DIAGNOSIS — K922 Gastrointestinal hemorrhage, unspecified: Secondary | ICD-10-CM

## 2021-03-21 DIAGNOSIS — I2510 Atherosclerotic heart disease of native coronary artery without angina pectoris: Secondary | ICD-10-CM

## 2021-03-21 DIAGNOSIS — D62 Acute posthemorrhagic anemia: Secondary | ICD-10-CM | POA: Diagnosis not present

## 2021-03-21 DIAGNOSIS — K5732 Diverticulitis of large intestine without perforation or abscess without bleeding: Secondary | ICD-10-CM

## 2021-03-21 DIAGNOSIS — N186 End stage renal disease: Secondary | ICD-10-CM

## 2021-03-21 DIAGNOSIS — I10 Essential (primary) hypertension: Secondary | ICD-10-CM | POA: Diagnosis not present

## 2021-03-21 DIAGNOSIS — K921 Melena: Principal | ICD-10-CM

## 2021-03-21 DIAGNOSIS — K651 Peritoneal abscess: Secondary | ICD-10-CM | POA: Diagnosis not present

## 2021-03-21 LAB — CBC
HCT: 24 % — ABNORMAL LOW (ref 36.0–46.0)
Hemoglobin: 7.8 g/dL — ABNORMAL LOW (ref 12.0–15.0)
MCH: 31.2 pg (ref 26.0–34.0)
MCHC: 32.5 g/dL (ref 30.0–36.0)
MCV: 96 fL (ref 80.0–100.0)
Platelets: 226 10*3/uL (ref 150–400)
RBC: 2.5 MIL/uL — ABNORMAL LOW (ref 3.87–5.11)
RDW: 13.3 % (ref 11.5–15.5)
WBC: 17.8 10*3/uL — ABNORMAL HIGH (ref 4.0–10.5)
nRBC: 0 % (ref 0.0–0.2)

## 2021-03-21 LAB — BASIC METABOLIC PANEL
Anion gap: 11 (ref 5–15)
BUN: 35 mg/dL — ABNORMAL HIGH (ref 8–23)
CO2: 25 mmol/L (ref 22–32)
Calcium: 8.5 mg/dL — ABNORMAL LOW (ref 8.9–10.3)
Chloride: 95 mmol/L — ABNORMAL LOW (ref 98–111)
Creatinine, Ser: 4.36 mg/dL — ABNORMAL HIGH (ref 0.44–1.00)
GFR, Estimated: 10 mL/min — ABNORMAL LOW (ref >60)
Glucose, Bld: 97 mg/dL (ref 70–99)
Potassium: 3.8 mmol/L (ref 3.5–5.1)
Sodium: 131 mmol/L — ABNORMAL LOW (ref 135–145)

## 2021-03-21 LAB — PHOSPHORUS: Phosphorus: 3.9 mg/dL (ref 2.5–4.6)

## 2021-03-21 LAB — MAGNESIUM: Magnesium: 2.2 mg/dL (ref 1.7–2.4)

## 2021-03-21 LAB — LACTIC ACID, PLASMA: Lactic Acid, Venous: 0.8 mmol/L (ref 0.5–1.9)

## 2021-03-21 MED ORDER — VANCOMYCIN HCL IN DEXTROSE 500-5 MG/100ML-% IV SOLN: 500.0000 mg | INTRAVENOUS | Status: DC

## 2021-03-21 NOTE — Progress Notes (Signed)
Pt picked up for dialysis 

## 2021-03-21 NOTE — Progress Notes (Signed)
PROGRESS NOTE  [name] UTM:[id number] DOB: Aug 13, 1951 DOA: 03/17/2021 PCP: Glenda Chroman, MD   LOS: 3 days   Brief narrative: Brittany Allerton [name] a 70 y.o.femalewith medical history significant forESRD on HD Tuesday Thursday Saturday, essential hypertension, dyslipidemia, chronic anemia and GERD presented to hospital from [facility] with worsening abdominal distention and pain with black tarry stools for 2 weeks with impaired appetite as well.  In the ED patient had elevated white count at 20.3 and creatinine at 44 ESRD.  He was given vancomycin and Zosyn initially.  CT scan of the abdomen pelvis showed some large collection of fluid and gas and debris's consistent with mesenteric abscess likely related to small bowel perforation.  Patient was then admitted to hospital for further evaluation and treatment.  Subsequently, IR was consulted for possible IR guided drain but there was no drainable collection noted.  Surgery on board.  Will follow recommendation.   Assessment/Plan:  Principal Problem:   Mesenteric abscess (HCC) Active Problems:   Hyperlipidemia   Mitral valve regurgitation   Essential hypertension   Coronary artery disease   SYSTOLIC HEART FAILURE, CHRONIC   End stage renal disease (HCC)   Perforation bowel (HCC)   Melena   Acute blood loss anemia   Suspected mesenteric abscess with possible associated bowel perforation -General surgery and IR on board.  IR consulted for drain placement but has not been placed due to no drainable collection.   -Continue empiric antibiotic.   -Following surgical recommendations we will continue slowly advancing diet and follow response; no acute surgical intervention needed. -WBCs are still elevated but otherwise clinically improving; will repeat CT abdomen in the next 48 hours based on clinical response.   Possible GI bleed with Anemia of chronic renal disease  -Patient had hemoglobin around 10.6, 3 weeks prior to admission.  Hemoglobin dropped to 7.0.   -Hemoglobin s/p transfusion 7.8  -Continue to follow hemoglobin trend -Appreciate GI inputs and will follow recommendations.  Possible GI bleed. Black Tarry stools for 3 weeks.   -Continue PPI -Continue slowly advancing diet -No overt bleeding currently appreciated -Following GI recommendations and patient's wishes no origin endoscopic evaluation.  ESRD on HD Tuesday, Thursday Saturday - Nephology on board for hemodialysis. -Patient had dialysis today (03/24/8126); no complications.  Essential hypertension -on hydralazine IV, labetalol IV as needed as needed.    -Continue to closely monitor blood pressure  Dyslipidemia -  -Continue holding statins until proven full oral tolerance to medications and diet.  CAD  -No chest pain -continue aspirin, imdur and statins when fully able to tolerate p.o.'s.  Debility, weakness.  Physical therapy consulted; will follow recommendations.  DVT prophylaxis: SCDs Start: 03/18/21 1207   Code Status: Full code  Family Communication: None.  Status is: Inpatient  Remains inpatient appropriate because:IV treatments appropriate due to intensity of illness or inability to take PO and Inpatient level of care appropriate due to severity of illness   Dispo: The patient is from: Home              Anticipated d/c is to: Home              Patient currently is not medically stable to d/c.   Difficult to place patient No  Consultants:  General surgery  Intervention radiology  Nephrology  Gastroenterology service  Procedures:  Hemodialysis  Anti-infectives:  . IV zosyn . Vancomycin discontinued on 03/21/21  Anti-infectives (From admission, onward)   Start  Dose/Rate Route Frequency Ordered Stop   03/21/21 1200  vancomycin (VANCOCIN) IVPB 500 mg/100 ml premix  Status:  Discontinued        500 mg 100 mL/hr over 60 Minutes Intravenous Every T-Th-Sa (Hemodialysis) 03/18/21 1229 03/21/21 0651   03/21/21  1200  vancomycin (VANCOCIN) IVPB 500 mg/100 ml premix  Status:  Discontinued        500 mg 100 mL/hr over 60 Minutes Intravenous Every T-Th-Sa (Hemodialysis) 03/21/21 0651 03/21/21 0724   03/20/21 1045  piperacillin-tazobactam (ZOSYN) 2.25 g in sodium chloride 0.9 % 50 mL IVPB        2.25 g 100 mL/hr over 30 Minutes Intravenous Every 8 hours 03/20/21 0945     03/18/21 1814  vancomycin (VANCOCIN) 1-5 GM/200ML-% IVPB       Note to Pharmacy: [name]   : cabinet override      03/18/21 1814 03/18/21 1819   03/18/21 1115  vancomycin (VANCOCIN) IVPB 1000 mg/200 mL premix        1,000 mg 200 mL/hr over 60 Minutes Intravenous  Once 03/18/21 1112 03/18/21 1919   03/18/21 1115  piperacillin-tazobactam (ZOSYN) IVPB 3.375 g  Status:  Discontinued        3.375 g 12.5 mL/hr over 240 Minutes Intravenous Every 12 hours 03/18/21 1112 03/20/21 0945      Subjective: Patient reports no nausea, no vomiting, no fever.  Denies chest pain.  Still with mild abdominal discomfort vague and diffuse.  Positive bowel movement overnight.  Tolerated clear liquid diet.    Objective: Vitals:   03/21/21 1439 03/21/21 1626  BP: (!) 142/27 138/60  Pulse: 66 66  Resp: 19   Temp: 97.8 F (36.6 C)   SpO2: 100%     Intake/Output Summary (Last 24 hours) at 03/21/2021 1858 Last data filed at 03/21/2021 1130 Gross per 24 hour  Intake 180 ml  Output 1500 ml  Net -1320 ml   Filed Weights   03/18/21 1945 03/21/21 0750 03/21/21 1130  Weight: 54.7 kg 55.7 kg 54 kg   Body mass index is 20.43 kg/m.   Physical Exam: General exam: Alert, awake, oriented x 3; no nausea, no vomiting, currently afebrile.  Patient in no acute distress; reports bowel movement overnight and tolerance to clear liquid diet. Respiratory system: Clear to auscultation. Respiratory effort normal.  No using accessory muscles. Cardiovascular system: Rate controlled, no rubs, no gallops, no JVD appreciated on exam.  No murmurs has been  here. Gastrointestinal system: Abdomen is nondistended, soft and mild vague tenderness on deep palpation.  Positive bowel sounds appreciated. Central nervous system: Alert and oriented. No focal neurological deficits. Extremities: No cyanosis or clubbing Skin: No petechiae. Psychiatry: Judgement and insight appear normal. Mood & affect appropriate.    Data Review: I have personally reviewed the following laboratory data and studies,  CBC: Recent Labs  Lab 03/18/21 0245 03/19/21 0102 03/19/21 0635 03/19/21 1518 03/19/21 1846 03/20/21 0059 03/21/21 0230  WBC 20.3* 15.7*  --   --   --  19.2* 17.8*  NEUTROABS 18.2*  --   --   --   --   --   --   HGB 9.5* 7.0* 7.6* 8.2* 8.0* 8.1* 7.8*  HCT 30.2* 21.9* 23.9* 25.4* 25.3* 25.7* 24.0*  MCV 101.3* 100.9*  --   --   --  99.6 96.0  PLT 218 167  --   --   --  212 027   Basic Metabolic Panel: Recent Labs  Lab 03/18/21 0245  03/19/21 0102 03/20/21 0059 03/21/21 0230  NA 134* 135 133* 131*  K 3.5 3.6 3.8 3.8  CL 96* 99 97* 95*  CO2 27 27 24 25   GLUCOSE 118* 76 43* 97  BUN 33* 17 28* 35*  CREATININE 4.05* 2.48* 3.65* 4.36*  CALCIUM 9.3 8.5* 8.8* 8.5*  MG  --  2.0  --  2.2  PHOS  --   --   --  3.9   Liver Function Tests: Recent Labs  Lab 03/18/21 0245 03/19/21 0102 03/20/21 0059  AST 18 14* 14*  ALT 13 11 11   ALKPHOS 73 67 73  BILITOT 0.6 0.7 1.3*  PROT 6.0* 5.3* 6.1*  ALBUMIN 2.5* 2.0* 2.2*    CBG: Recent Labs  Lab 03/20/21 0241 03/20/21 0317 03/20/21 1524 03/20/21 1959  GLUCAP 44* 136* 55* 155*   Recent Results (from the past 240 hour(s))  Resp Panel by RT-PCR (Flu A&B, Covid) Nasopharyngeal Swab     Status: None   Collection Time: 03/18/21  8:15 AM   Specimen: Nasopharyngeal Swab; Nasopharyngeal(NP) swabs in vial transport medium  Result Value Ref Range Status   SARS Coronavirus 2 by RT PCR NEGATIVE NEGATIVE Final    Comment: (NOTE) SARS-CoV-2 target nucleic acids are NOT DETECTED.  The SARS-CoV-2 RNA is  generally detectable in upper respiratory specimens during the acute phase of infection. The lowest concentration of SARS-CoV-2 viral copies this assay can detect is 138 copies/mL. A negative result does not preclude SARS-Cov-2 infection and should not be used as the sole basis for treatment or other patient management decisions. A negative result may occur with  improper specimen collection/handling, submission of specimen other than nasopharyngeal swab, presence of viral mutation(s) within the areas targeted by this assay, and inadequate number of viral copies(<138 copies/mL). A negative result must be combined with clinical observations, patient history, and epidemiological information. The expected result is Negative.  Fact Sheet for Patients:  EntrepreneurPulse.com.au  Fact Sheet for Healthcare Providers:  IncredibleEmployment.be  This test is no t yet approved or cleared by the [place] FDA and  has been authorized for detection and/or diagnosis of SARS-CoV-2 by FDA under an Emergency Use Authorization (EUA). This EUA will remain  in effect (meaning this test can be used) for the duration of the COVID-19 declaration under Section 564(b)(1) of the Act, 21 U.S.C.section 360bbb-3(b)(1), unless the authorization is terminated  or revoked sooner.       Influenza A by PCR NEGATIVE NEGATIVE Final   Influenza B by PCR NEGATIVE NEGATIVE Final    Comment: (NOTE) The Xpert Xpress SARS-CoV-2/FLU/RSV plus assay is intended as an aid in the diagnosis of influenza from Nasopharyngeal swab specimens and should not be used as a sole basis for treatment. Nasal washings and aspirates are unacceptable for Xpert Xpress SARS-CoV-2/FLU/RSV testing.  Fact Sheet for Patients: EntrepreneurPulse.com.au  Fact Sheet for Healthcare Providers: IncredibleEmployment.be  This test is not yet approved or cleared by the [place] FDA and has been authorized for detection and/or diagnosis of SARS-CoV-2 by FDA under an Emergency Use Authorization (EUA). This EUA will remain in effect (meaning this test can be used) for the duration of the COVID-19 declaration under Section 564(b)(1) of the Act, 21 U.S.C. section 360bbb-3(b)(1), unless the authorization is terminated or revoked.  Performed at [facility], [address] [place], [place] [address]      Studies: No results found.   Barton Dubois, MD  [facility] 03/21/2021  If 7PM-7AM, please contact night-coverage

## 2021-03-21 NOTE — Progress Notes (Signed)
Progress Note     Subjective: Patient reports some improvement in abdominal pain. Had a BM overnight. Tolerated CLD.   Objective: Vital signs in last 24 hours: Temp:  [97.4 F (36.3 C)-98.3 F (36.8 C)] 97.8 F (36.6 C) (05/03 1130) Pulse Rate:  [60-71] 63 (05/03 1130) Resp:  [16-20] 20 (05/03 1130) BP: (120-154)/(46-60) 150/57 (05/03 1130) SpO2:  [96 %-100 %] 97 % (05/03 1130) Weight:  [54 kg-55.7 kg] 54 kg (05/03 1130) Last BM Date: 03/20/21  Intake/Output from previous day: 05/02 0701 - 05/03 0700 In: 750 [P.O.:700; IV Piggyback:50] Out: -  Intake/Output this shift: Total I/O In: 180 [P.O.:180] Out: 1500 [Other:1500]  PE: General: pleasant, chronically ill appearing female seen in HD unit  Heart: regular, rate, and rhythm.   Lungs: Respiratory effort nonlabored Abd: +BS, visible umbilical hernia which reduces easily. Non-distended, mild ttp in RLQ without peritonitis  MS: all 4 extremities are symmetrical with no cyanosis, clubbing, or edema. Skin: warm and dry with no masses, lesions, or rashes Neuro: Cranial nerves 2-12 grossly intact, sensation is normal throughout Psych: A&Ox3 with an appropriate affect.   Lab Results:  Recent Labs    03/20/21 0059 03/21/21 0230  WBC 19.2* 17.8*  HGB 8.1* 7.8*  HCT 25.7* 24.0*  PLT 212 226   BMET Recent Labs    03/20/21 0059 03/21/21 0230  NA 133* 131*  K 3.8 3.8  CL 97* 95*  CO2 24 25  GLUCOSE 43* 97  BUN 28* 35*  CREATININE 3.65* 4.36*  CALCIUM 8.8* 8.5*   PT/INR No results for input(s): LABPROT, INR in the last 72 hours. CMP     Component Value Date/Time   NA 131 (L) 03/21/2021 0230   NA 128 (L) 10/12/2013 2231   K 3.8 03/21/2021 0230   K 5.1 10/12/2013 2231   CL 95 (L) 03/21/2021 0230   CL 90 (L) 10/12/2013 2231   CO2 25 03/21/2021 0230   CO2 28 10/12/2013 2231   GLUCOSE 97 03/21/2021 0230   GLUCOSE 436 (H) 10/12/2013 2231   BUN 35 (H) 03/21/2021 0230   BUN 78 (H) 10/12/2013 2231    CREATININE 4.36 (H) 03/21/2021 0230   CREATININE 5.60 (H) 10/12/2013 2231   CALCIUM 8.5 (L) 03/21/2021 0230   CALCIUM 9.7 10/12/2013 2231   PROT 6.1 (L) 03/20/2021 0059   ALBUMIN 2.2 (L) 03/20/2021 0059   AST 14 (L) 03/20/2021 0059   ALT 11 03/20/2021 0059   ALKPHOS 73 03/20/2021 0059   BILITOT 1.3 (H) 03/20/2021 0059   GFRNONAA 10 (L) 03/21/2021 0230   GFRNONAA 7 (L) 10/12/2013 2231   GFRAA 9 (L) 10/12/2013 2231   Lipase     Component Value Date/Time   LIPASE 28 02/20/2021 0705       Studies/Results: CT ABDOMEN PELVIS W CONTRAST  Result Date: 03/19/2021 CLINICAL DATA:  Suspected abdominal abscess. EXAM: CT ABDOMEN AND PELVIS WITH CONTRAST TECHNIQUE: Multidetector CT imaging of the abdomen and pelvis was performed using the standard protocol following bolus administration of intravenous contrast. CONTRAST:  29mL OMNIPAQUE IOHEXOL 350 MG/ML SOLN COMPARISON:  None. FINDINGS: Lower chest: Basilar atelectasis bilaterally with small bilateral pleural effusions. Hepatobiliary: Small hepatic cysts with periportal edema and intrahepatic biliary duct dilatation, similar to prior. Gallbladder is massively distended as before. Common bile duct measures up to 12 mm diameter, stable. Pancreas: No focal mass lesion. No dilatation of the main duct. No intraparenchymal cyst. No peripancreatic edema. Spleen: Tiny hypodensity in the medial spleen is  similar to prior. Adrenals/Urinary Tract: Stable appearance of the multicystic kidneys bilaterally with dilated left intrarenal collecting system and proximal left ureter down to the level of the previously described retroperitoneal clip. Bladder is nondistended. Transplant kidney noted left pelvis. Stomach/Bowel: Stomach is decompressed. Large duodenal diverticulum noted small bowel is clustered in the anterior abdomen due to the renal enlargement and lack of intra-abdominal fat. The 5.0 x 4.3 cm rim enhancing collection of gas and debris seen previously is not  evident on the current study although there is a small 2 x 1.2 cm fluid density structure at this location on image 55/3. Multiple additional round collections of gas and debris are again noted in the mesentery, clearly seen to represent small bowel diverticuli on today's study, 1 of which has become opacified (axial 57/3 and coronal 30/6). Colon is nondistended with features suggesting diffuse diverticular disease. Vascular/Lymphatic: There is abdominal aortic atherosclerosis without aneurysm. Right external iliac vein stent again noted. There is no gastrohepatic or hepatoduodenal ligament lymphadenopathy. No retroperitoneal or mesenteric lymphadenopathy. No pelvic sidewall lymphadenopathy. Right groin dialysis fistula has been incompletely visualized. Reproductive: Calcified fibroids.  There is no adnexal mass. Other: Diffuse body wall and mesenteric edema. Musculoskeletal: No worrisome lytic or sclerotic osseous abnormality. IMPRESSION: 1. The 5.0 x 4.3 cm rim enhancing collection of gas and debris seen previously is not evident on the current study although there is a small 2 x 1.2 cm fluid density structure at this location. Presumably this was a large irregular patulous diverticulum that has decompressed in the interval or dilated irregular small bowel loop. 2. Multiple additional round collections of gas and debris are again noted in the mesentery, clearly seen to represent small bowel diverticuli on today's study. 3. Massive distention of the gallbladder with periportal edema and biliary duct dilatation, similar to prior. 4. Stable appearance of the multicystic kidneys bilaterally with dilated left intrarenal collecting system and proximal left ureter down to the level of the previously described retroperitoneal clip. 5. Diffuse body wall and mesenteric edema with small bilateral pleural effusions. 6. Aortic Atherosclerosis (ICD10-I70.0). Electronically Signed   By: Misty Stanley M.D.   On: 03/19/2021 14:59     Anti-infectives: Anti-infectives (From admission, onward)   Start     Dose/Rate Route Frequency Ordered Stop   03/21/21 1200  vancomycin (VANCOCIN) IVPB 500 mg/100 ml premix  Status:  Discontinued        500 mg 100 mL/hr over 60 Minutes Intravenous Every T-Th-Sa (Hemodialysis) 03/18/21 1229 03/21/21 0651   03/21/21 1200  vancomycin (VANCOCIN) IVPB 500 mg/100 ml premix  Status:  Discontinued        500 mg 100 mL/hr over 60 Minutes Intravenous Every T-Th-Sa (Hemodialysis) 03/21/21 0651 03/21/21 0724   03/20/21 1045  piperacillin-tazobactam (ZOSYN) 2.25 g in sodium chloride 0.9 % 50 mL IVPB        2.25 g 100 mL/hr over 30 Minutes Intravenous Every 8 hours 03/20/21 0945     03/18/21 1814  vancomycin (VANCOCIN) 1-5 GM/200ML-% IVPB       Note to Pharmacy: [name]   : cabinet override      03/18/21 1814 03/18/21 1819   03/18/21 1115  vancomycin (VANCOCIN) IVPB 1000 mg/200 mL premix        1,000 mg 200 mL/hr over 60 Minutes Intravenous  Once 03/18/21 1112 03/18/21 1919   03/18/21 1115  piperacillin-tazobactam (ZOSYN) IVPB 3.375 g  Status:  Discontinued        3.375 g 12.5 mL/hr  over 240 Minutes Intravenous Every 12 hours 03/18/21 1112 03/20/21 0945       Assessment/Plan ESRD on HD TTS  Acute on chronic blood loss anemia Heme positive stools - recommend GI consult  - h/h 7.8/24.0  Abdominal pain and leukocytosis  - initial outside CT with question of mesenteric abscess - repeat CT 5/1 now without enhancing collection, findings of fluid density consistent with patulous diverticulum - some ttp in RLQ but no peritonitis - no indication for surgical exploration at this time - having bowel function and tolerated CLD - ok to advance to FLD from surgical standpoint  - WBC remains elevated at 17, afeb - consider repeat CT in a few days if not improving   FEN: FLD ID: [email or web address]  VTE: SCDs  LOS: 3 days    Norm Parcel, [facility] 03/21/2021, 12:02  PM Please see Amion for pager number during day hours 7:00am-4:30pm

## 2021-03-21 NOTE — Procedures (Signed)
I was present at this dialysis session. I have reviewed the session itself and made appropriate changes.   RLE AVG, 3K bath. GOal UF 1.5L  BPS stable.  Tol Tx well.   Filed Weights   03/18/21 1600 03/18/21 1945 03/21/21 0750  Weight: 56.2 kg 54.7 kg 55.7 kg    Recent Labs  Lab 03/21/21 0230  NA 131*  K 3.8  CL 95*  CO2 25  GLUCOSE 97  BUN 35*  CREATININE 4.36*  CALCIUM 8.5*  PHOS 3.9    Recent Labs  Lab 03/18/21 0245 03/19/21 0102 03/19/21 0635 03/19/21 1846 03/20/21 0059 03/21/21 0230  WBC 20.3* 15.7*  --   --  19.2* 17.8*  NEUTROABS 18.2*  --   --   --   --   --   HGB 9.5* 7.0*   < > 8.0* 8.1* 7.8*  HCT 30.2* 21.9*   < > 25.3* 25.7* 24.0*  MCV 101.3* 100.9*  --   --  99.6 96.0  PLT 218 167  --   --  212 226   < > = values in this interval not displayed.    Scheduled Meds: . Chlorhexidine Gluconate Cloth  6 each Topical Q0600  . cycloSPORINE  1 drop Both Eyes BID  . hydrALAZINE  10 mg Intravenous Q6H  . latanoprost  1 drop Both Eyes QHS  . pantoprazole (PROTONIX) IV  40 mg Intravenous Q12H  . sodium chloride flush  3 mL Intravenous Q12H   Continuous Infusions: . sodium chloride    . sodium chloride    . piperacillin-tazobactam (ZOSYN)  IV 2.25 g (03/21/21 0311)   PRN Meds:.sodium chloride, acetaminophen **OR** acetaminophen, HYDROmorphone (DILAUDID) injection, labetalol, ondansetron **OR** ondansetron (ZOFRAN) IV, sodium chloride flush   Pearson Grippe  MD 03/21/2021, 9:21 AM

## 2021-03-21 NOTE — Progress Notes (Signed)
Notified Dr. Bryan Lemma that pt had a very small amount of frank blood from the rectum when we changed her sanitary pad and panties around 1740 today.  He said the plan will still be to keep a close eye on her.

## 2021-03-21 NOTE — Progress Notes (Signed)
Pt back on unit at 1200

## 2021-03-21 NOTE — Consult Note (Addendum)
Attending physician's note   I have taken an interval history, reviewed Brittany chart and examined Brittany patient. I agree with Brittany Advanced Practitioner's note, impression, and recommendations as outlined.   70 year old [name] with medical history as outlined below admitted 03/18/2021 with abdominal pain, leukocytosis (WBC 20), lactic acidosis, and initially thought to have mesenteric abscess based on admission CT.  Was started on broad-spectrum antibiotics.  Repeat CT on 5/1 with possible large duodenal diverticulum measuring 2 x 1.2 cm (instead of abscess) and additional small bowel diverticula.  Imaging additionally shows distended GB and CBD dilation to 12 mm without CDL.  Liver enzymes have remained normal.  GI service was consulted for possible melena and anemia.  She does continue to have generalized abdominal pain, but reports this has improved since hospital admission.  - Imaging not entirely clear if this was an abscess at Brittany beginning or if this really was duodenal diverticulum.  However, she seems to be improving clinically with current antimicrobial therapy. - H/H largely stable at this point, although had 3 gm drop from a few weeks prior to admission.  We discussed endoscopic evaluation versus continued observation, and she prefers Brittany latter.  Based on her overall stability and Brittany possibility of abscess/microperforation, I agree with plan for continued observation from a GI/bleed standpoint - Continue serial H/H checks - Continue PPI - Continue antimicrobial therapy per primary Hospitalist and consulting Surgical services  Gerrit Heck, [place], [place] [phone] office            Consultation  Referring Provider:  [facility]/ Dyann Kief Primary Care Physician:  Glenda Chroman, MD Primary Gastroenterologist:  Dr. Laural Golden -2012  Reason for Consultation:  Anemia, blood in stool  HPI: Brittany [name] is a 70 y.o. [name] who we are asked to evaluate for GI bleeding.  Patient has multiple medical  issues including end-stage renal disease, on hemodialysis over Brittany past 23 years.  She had a remote kidney transplant, failed.  Also with history of hypertension, chronic anemia, GERD, congestive heart failure, cardiomyopathy and coronary artery disease. She was admitted on 03/18/2021 with complaints of abdominal pain, distention and tarry stools.  She tells me she was having symptoms for several days prior to admission, and says she had pretty severe abdominal pain at onset of symptoms which was rather generalized and then became constant and crampy in nature.  She was having associated loose stools a couple of times per day and says these were dark black.  She did have some hot and cold spells and felt like she had a fever, no nausea or vomiting, appetite was decreased.  She does take aspirin daily, no other blood thinners.  No Advil or Aleve etc.  She says she came to [facility] hospital because of Brittany persistence of Brittany dark black stools. She does not have any prior history of GI bleeding.  She had a remote colonoscopy it appears in 2012 per Dr. Laural Golden in [place]. On admission she had significant leukocytosis with WBC of 20,000, hemoglobin 9.5/hematocrit 30.2 BUN 33/creatinine 4.05, lactate 2.0 She has been covered with IV antibiotics, Zosyn and vancomycin. CT imaging was done with finding of a 5.0 x 4.3 x 3.1 cm collection of gas and debris in Brittany anterior central mesentery of Brittany pelvis with an irregular rim of peripheral enhancement suspicious for mesenteric abscess theology uncertain small bowel perforation would be consideration, there were several other areas, small circular areas of gas and debris in Brittany lower small bowel mesentery indeterminate, marked  bilateral polycystic kidney disease with numerous calcifications/stones, left hydronephrosis with ureteral obstruction in Brittany mid ureter, mild volume of free fluid and mild intra and extrahepatic biliary ductal dilation with some associated periportal  edema CBD 12 mm with markedly distended gallbladder  She was seen and evaluated by surgery, consideration given to probable mesenteric abscess. PLan was for IR drainage.  She has had resolution of Brittany leukocytosis, hemoglobin on 03/20/1999 20-7.0 hematocrit of 26.9, yesterday hemoglobin 8.1 and today hemoglobin 7.8 hematocrit of 24.0 Repeat CT imaging was done on 03/19/2021 again showing a markedly distended gallbladder and CBD of 12 mm.  Brittany previously noted large fluid collection is now felt to be a large duodenal diverticulum, this now measures 2.0 x 1.2 mm.  She is also noted to have multiple other gas and debris collections in Brittany mesentery felt to represent small bowel diverticuli,   Interestingly patient had an ER visit on 02/20/2021 with complaints of nausea and abdominal pain which was diffuse and across her entire abdomen.  CT imaging at that time without IV contrast did not show any evidence of Brittany large duodenal diverticulum or Brittany other gas and debris-filled structure is now felt to be small bowel diverticuli.  She had stable findings of severe polycystic kidney disease and stable left hydronephrosis, distended gallbladder and dilated CBD also not noted.  Patient says she definitely feels better since admission but continues to have ongoing fairly constant abdominal pain though not as severe.  This is still rather diffuse.  She is not having any nausea or vomiting and has been tolerating liquids without difficulty.  She is having less bowel movements and says they are a bit more formed but still dark to black in color.  Past Medical History:  Diagnosis Date  . Chronic systolic heart failure ([place])   . Coronary atherosclerosis of native coronary artery   . Diseases of tricuspid valve   . End stage renal disease ([place])    dialysis T,Th & Sat  . Mitral valve insufficiency and aortic valve insufficiency   . Other and unspecified hyperlipidemia   . Secondary cardiomyopathy, unspecified   .  Unspecified essential hypertension     Past Surgical History:  Procedure Laterality Date  . A/V SHUNTOGRAM N/A 08/10/2019   Procedure: A/V SHUNTOGRAM;  Surgeon: Algernon Huxley, MD;  Location: [facility];  Service: Cardiovascular;  Laterality: N/A;  . AV FISTULA PLACEMENT Right 2012  . AV FISTULA PLACEMENT Left 2004  . CATARACT EXTRACTION W/PHACO Left 08/07/2019   Procedure: CATARACT EXTRACTION PHACO AND INTRAOCULAR LENS PLACEMENT LEFT EYE;  Surgeon: [name], MD;  Location: [facility];  Service: Ophthalmology;  Laterality: Left;  left  . CATARACT EXTRACTION W/PHACO Right 08/21/2019   Procedure: CATARACT EXTRACTION PHACO AND INTRAOCULAR LENS PLACEMENT RIGHT EYE (CDE: 5.45);  Surgeon: [name], MD;  Location: [facility];  Service: Ophthalmology;  Laterality: Right;  . INSERTION OF DIALYSIS CATHETER N/A 01/08/2013   Procedure: INSERTION OF DIALYSIS CATHETER;  Surgeon: Angelia Mould, MD;  Location: [facility];  Service: Vascular;  Laterality: N/A;  . KIDNEY TRANSPLANT Left 2002   pt. states transplant lasted 2 yrs.  Marland Kitchen PERIPHERAL VASCULAR CATHETERIZATION Right 08/27/2016   Procedure: A/V Shuntogram/Fistulagram;  Surgeon: Algernon Huxley, MD;  Location: [facility];  Service: Cardiovascular;  Laterality: Right;    Prior to Admission medications   Medication Sig Start Date End Date Taking? Authorizing Provider  acetaminophen (TYLENOL) 500 MG tablet Take 1,000 mg by mouth  every 6 (six) hours as needed for pain (headache).   Yes [provider]  aspirin 81 MG tablet Take 81 mg by mouth daily.   Yes [provider]  B Complex-C-Folic Acid (RENAL) 1 MG CAPS Take 1 mg by mouth daily.   Yes [provider]  cycloSPORINE (RESTASIS) 0.05 % ophthalmic emulsion Place 1 drop into both eyes 2 (two) times daily.   Yes [provider]  latanoprost (XALATAN) 0.005 % ophthalmic solution Place 1 drop into both eyes at bedtime.   Yes [provider]  B  Complex-C-Zn-Folic Acid (NEPHPLEX RX PO) Take 1 tablet by mouth daily.    [provider]  cinacalcet (SENSIPAR) 30 MG tablet Take 30 mg by mouth daily after supper.    [provider]  docusate sodium (COLACE) 100 MG capsule Take 100 mg by mouth daily. 10/28/13   [provider]  isosorbide mononitrate (IMDUR) 30 MG 24 hr tablet Take 30 mg by mouth daily.    [provider]  labetalol (NORMODYNE) 200 MG tablet Take 100 mg by mouth 2 (two) times daily. 06/22/20   [provider]  minoxidil (LONITEN) 2.5 MG tablet Take 2.5 mg by mouth 2 (two) times daily.    [provider]  omeprazole (PRILOSEC) 40 MG capsule Take 40 mg by mouth daily. 10/23/17   [provider]  sevelamer carbonate (RENVELA) 800 MG tablet Take 800-2,400 mg by mouth 3 (three) times daily with meals. 800 mg with snacks. 2,400 mg with meals    [provider]  simvastatin (ZOCOR) 20 MG tablet Take 20 mg by mouth daily.    [provider]  traMADol (ULTRAM) 50 MG tablet Take 50 mg by mouth daily as needed for moderate pain.    [provider]    Current Facility-Administered Medications  Medication Dose Route Frequency Provider Last Rate Last Admin  . 0.9 %  sodium chloride infusion  10 mL/hr Intravenous Once Manuella Ghazi, Pratik D, DO      . 0.9 %  sodium chloride infusion  250 mL Intravenous PRN Manuella Ghazi, Pratik D, DO      . acetaminophen (TYLENOL) tablet 650 mg  650 mg Oral Q6H PRN Heath Lark D, DO   650 mg at 03/20/21 2012   Or  . acetaminophen (TYLENOL) suppository 650 mg  650 mg Rectal Q6H PRN Manuella Ghazi, Pratik D, DO      . Chlorhexidine Gluconate Cloth 2 % PADS 6 each  6 each Topical Q0600 Rosita Fire, MD   6 each at 03/21/21 0636  . cycloSPORINE (RESTASIS) 0.05 % ophthalmic emulsion 1 drop  1 drop Both Eyes BID [name] A, DO   1 drop at 03/20/21 2020  . hydrALAZINE (APRESOLINE) injection 10 mg  10 mg Intravenous Q6H Griffith, Kelly A,  DO   10 mg at 03/21/21 0145  . HYDROmorphone (DILAUDID) injection 0.5-1 mg  0.5-1 mg Intravenous Q2H PRN Manuella Ghazi, Pratik D, DO      . labetalol (NORMODYNE) injection 10 mg  10 mg Intravenous Q2H PRN Manuella Ghazi, Pratik D, DO      . latanoprost (XALATAN) 0.005 % ophthalmic solution 1 drop  1 drop Both Eyes QHS [name] A, DO   1 drop at 03/20/21 2013  . ondansetron (ZOFRAN) tablet 4 mg  4 mg Oral Q6H PRN Manuella Ghazi, Pratik D, DO       Or  . ondansetron (ZOFRAN) injection 4 mg  4 mg Intravenous Q6H PRN Manuella Ghazi, Pratik  D, DO      . pantoprazole (PROTONIX) injection 40 mg  40 mg Intravenous Q12H Pokhrel, Laxman, MD   40 mg at 03/20/21 2012  . piperacillin-tazobactam (ZOSYN) 2.25 g in sodium chloride 0.9 % 50 mL IVPB  2.25 g Intravenous Q8H Pokhrel, Laxman, MD 100 mL/hr at 03/21/21 0311 2.25 g at 03/21/21 0311  . sodium chloride flush (NS) 0.9 % injection 3 mL  3 mL Intravenous Q12H Shah, Pratik D, DO   3 mL at 03/20/21 2016  . sodium chloride flush (NS) 0.9 % injection 3 mL  3 mL Intravenous PRN Manuella Ghazi, Pratik D, DO        Allergies as of 03/17/2021 - Review Complete 03/17/2021  Allergen Reaction Noted  . Dilaudid [hydromorphone] Nausea And Vomiting 03/21/2015  . Dilaudid  [hydromorphone hcl]  08/03/2019  . Tape Rash 01/07/2013    No family history on file.  Social History   Socioeconomic History  . Marital status: Single    Spouse name: Not on file  . Number of children: Not on file  . Years of education: Not on file  . Highest education level: Not on file  Occupational History  . Not on file  Tobacco Use  . Smoking status: Never Smoker  . Smokeless tobacco: Never Used  Vaping Use  . Vaping Use: Never used  Substance and Sexual Activity  . Alcohol use: No  . Drug use: No  . Sexual activity: Not on file  Other Topics Concern  . Not on file  Social History Narrative  . Not on file   Social Determinants of Health   Financial Resource Strain: Not on file  Food Insecurity: Not on file   Transportation Needs: Not on file  Physical Activity: Not on file  Stress: Not on file  Social Connections: Not on file  Intimate Partner Violence: Not on file    Review of Systems: Pertinent positive and negative review of systems were noted in Brittany above HPI section.  All other review of systems was otherwise negative.  Physical Exam: Vital signs in last 24 hours: Temp:  [97.4 F (36.3 C)-98.3 F (36.8 C)] 97.5 F (36.4 C) (05/03 0755) Pulse Rate:  [60-71] 64 (05/03 0755) Resp:  [16-20] 20 (05/03 1130) BP: (120-154)/(46-60) 150/57 (05/03 1130) SpO2:  [96 %-100 %] 96 % (05/03 0755) Weight:  [55.7 kg] 55.7 kg (05/03 0750) Last BM Date: 03/20/21 General:   Alert,  Well-developed, well-nourished, somewhat chronically ill-appearing older African-American [name] pleasant and cooperative in NAD Head:  Normocephalic and atraumatic. Eyes:  Sclera clear, no icterus.   Conjunctiva pink. Ears:  Normal auditory acuity. Nose:  No deformity, discharge,  or lesions. Mouth:  No deformity or lesions.   Neck:  Supple; no masses or thyromegaly. Lungs:  Clear throughout to auscultation.   No wheezes, crackles, or rhonchi. Heart:  Regular rate and rhythm; soft murmur Abdomen:  Soft, other diffusely tender across Brittany entire abdomen some guarding, no rebound, BS active,nonpalp mass or hsm.  Bilateral bruits, small periumbilical hernia Rectal:  Deferred  Msk:  Symmetrical without gross deformities. . Pulses:  Normal pulses noted. Extremities:  Without clubbing or edema.  Dialysis graft in Brittany left lower extremity/thigh Neurologic:  Alert and  oriented x4;  grossly normal neurologically. Skin:  Intact without significant lesions or rashes.. Psych:  Alert and cooperative. Normal mood and affect.  Intake/Output from previous day: 05/02 0701 - 05/03 0700 In: 750 [P.O.:700; IV Piggyback:50] Out: -  Intake/Output this shift:  Total I/O In: 180 [P.O.:180] Out: -   Lab Results: Recent Labs     03/19/21 0102 03/19/21 0635 03/19/21 1846 03/20/21 0059 03/21/21 0230  WBC 15.7*  --   --  19.2* 17.8*  HGB 7.0*   < > 8.0* 8.1* 7.8*  HCT 21.9*   < > 25.3* 25.7* 24.0*  PLT 167  --   --  212 226   < > = values in this interval not displayed.   BMET Recent Labs    03/19/21 0102 03/20/21 0059 03/21/21 0230  NA 135 133* 131*  K 3.6 3.8 3.8  CL 99 97* 95*  CO2 27 24 25   GLUCOSE 76 43* 97  BUN 17 28* 35*  CREATININE 2.48* 3.65* 4.36*  CALCIUM 8.5* 8.8* 8.5*   LFT Recent Labs    03/20/21 0059  PROT 6.1*  ALBUMIN 2.2*  AST 14*  ALT 11  ALKPHOS 73  BILITOT 1.3*   PT/INR No results for input(s): LABPROT, INR in Brittany last 72 hours. Hepatitis Panel Recent Labs    03/18/21 1600  HEPBSAG NON REACTIVE    IMPRESSION:  #15 70 year old African-American [name] with end-stage renal disease, on admitted 03/18/2021 after about a 2-week history of acute abdominal pain which she describes as diffuse, and sharp.  Along with this she started having loose very dark stools, hot and cold spells at home no documented temp. Initial ER visit on 02/20/2021 with acute abdominal pain and unrevealing CT for any acute process On admission 03/18/2021 as she had significant leukocytosis with WBC of 20,000 and elevated lactate. CT imaging was concerning for mesenteric abscess and also noted multiple other small areas of gas and debris in Brittany small bowel mesentery.  Initially managed with IV antibiotics, plan was for IR drainage however on repeat CT 03/19/2021 radiology interpreted this fluid and gas-filled structure as a large duodenal diverticulum and felt Brittany other smaller gas and debris-filled structures were small bowel diverticuli. He has also been noted to have a markedly distended gallbladder with sludge and a dilated CBD of 12 mm, no choledocholithiasis  LFTs have been normal  Has had improvement since admission but continues to be diffusely tender.  Etiology of illness is certainly not clear.   Not convinced that Brittany gas and debris-filled collections are all benign small bowel diverticuli. Initial onset of illness around 02/20/2021-  Question small perforation with subsequent abscesses Question mesenteric ischemia She has had some low-grade GI bleeding with Brittany above process  #2 chronically distended gallbladder, dilated CBD with normal LFTs #3 #4 congestive heart failure 5.  Coronary artery disease 6.  Cardiomyopathy 7.  Remote failed kidney transplant 8.  Chronic left hydronephrosis   PLAN: Clear to full liquid diet Continue serial hemoglobins Continue IV antibiotics IV PPI Will discuss with team regarding further imaging prior to any decisions for endoscopic evaluation, until we are certain that she has not had a microperforation as inciting event.  Will follow with you   Brittany EsterwoodPA-C  03/21/2021, 11:40 AM

## 2021-03-22 DIAGNOSIS — N186 End stage renal disease: Secondary | ICD-10-CM | POA: Diagnosis not present

## 2021-03-22 DIAGNOSIS — I5022 Chronic systolic (congestive) heart failure: Secondary | ICD-10-CM | POA: Diagnosis not present

## 2021-03-22 DIAGNOSIS — K651 Peritoneal abscess: Secondary | ICD-10-CM | POA: Diagnosis not present

## 2021-03-22 DIAGNOSIS — I10 Essential (primary) hypertension: Secondary | ICD-10-CM | POA: Diagnosis not present

## 2021-03-22 DIAGNOSIS — K921 Melena: Secondary | ICD-10-CM | POA: Diagnosis not present

## 2021-03-22 DIAGNOSIS — Z992 Dependence on renal dialysis: Secondary | ICD-10-CM

## 2021-03-22 DIAGNOSIS — D62 Acute posthemorrhagic anemia: Secondary | ICD-10-CM | POA: Diagnosis not present

## 2021-03-22 DIAGNOSIS — R1084 Generalized abdominal pain: Secondary | ICD-10-CM

## 2021-03-22 LAB — CBC
HCT: 25.2 % — ABNORMAL LOW (ref 36.0–46.0)
Hemoglobin: 8 g/dL — ABNORMAL LOW (ref 12.0–15.0)
MCH: 31.3 pg (ref 26.0–34.0)
MCHC: 31.7 g/dL (ref 30.0–36.0)
MCV: 98.4 fL (ref 80.0–100.0)
Platelets: 194 10*3/uL (ref 150–400)
RBC: 2.56 MIL/uL — ABNORMAL LOW (ref 3.87–5.11)
RDW: 13.3 % (ref 11.5–15.5)
WBC: 9.7 10*3/uL (ref 4.0–10.5)
nRBC: 0 % (ref 0.0–0.2)

## 2021-03-22 MED ORDER — DOCUSATE SODIUM 100 MG PO CAPS
100.0000 mg | ORAL_CAPSULE | Freq: Every day | ORAL | Status: DC
  Administered 2021-03-22 – 2021-03-28 (×6): 100 mg via ORAL
  Filled 2021-03-22 (×6): qty 1

## 2021-03-22 MED ORDER — AMOXICILLIN-POT CLAVULANATE 500-125 MG PO TABS
1.0000 | ORAL_TABLET | Freq: Two times a day (BID) | ORAL | Status: DC
  Administered 2021-03-22 – 2021-03-27 (×10): 500 mg via ORAL
  Filled 2021-03-22 (×13): qty 1

## 2021-03-22 MED ORDER — HYDRALAZINE HCL 20 MG/ML IJ SOLN: 10.0000 mg | Freq: Four times a day (QID) | INTRAMUSCULAR | Status: DC | PRN

## 2021-03-22 MED ORDER — LABETALOL HCL 100 MG PO TABS
100.0000 mg | ORAL_TABLET | Freq: Two times a day (BID) | ORAL | Status: DC
  Administered 2021-03-22 – 2021-03-30 (×15): 100 mg via ORAL
  Filled 2021-03-22 (×15): qty 1

## 2021-03-22 MED ORDER — CINACALCET HCL 30 MG PO TABS
30.0000 mg | ORAL_TABLET | Freq: Every day | ORAL | Status: DC
  Administered 2021-03-22 – 2021-03-29 (×7): 30 mg via ORAL
  Filled 2021-03-22 (×9): qty 1

## 2021-03-22 MED ORDER — ISOSORBIDE MONONITRATE ER 30 MG PO TB24
30.0000 mg | ORAL_TABLET | Freq: Every day | ORAL | Status: DC
  Administered 2021-03-22 – 2021-03-30 (×8): 30 mg via ORAL
  Filled 2021-03-22 (×9): qty 1

## 2021-03-22 NOTE — Progress Notes (Addendum)
PROGRESS NOTE  [name] IFO:[id number] DOB: [birth year] DOA: 03/17/2021 PCP: Glenda Chroman, MD   LOS: 4 days   Brief narrative:  Brittany Tufano [name] a 70 y.o.femalewith medical history significant forESRD on HD Tuesday Thursday Saturday, essential hypertension, dyslipidemia, chronic anemia and GERD presented to hospital from [facility] with worsening abdominal distention and pain with black tarry stools for 2 weeks with impaired appetite as well.  In the ED patient had elevated white count at 20.3 and creatinine at 44 ESRD.  He was given vancomycin and Zosyn initially.  CT scan of the abdomen pelvis showed some large collection of fluid and gas and debris's consistent with mesenteric abscess likely related to small bowel perforation.  Patient was then admitted to hospital for further evaluation and treatment.  Subsequently, IR was consulted for possible IR guided drain but there was no drainable collection noted.  Surgery on board.  Will follow recommendation.   Assessment/Plan:  Principal Problem:   Mesenteric abscess (HCC) Active Problems:   Hyperlipidemia   Mitral valve regurgitation   Essential hypertension   Coronary artery disease   SYSTOLIC HEART FAILURE, CHRONIC   End stage renal disease (HCC)   Perforation bowel (HCC)   Melena   Acute blood loss anemia   Suspected mesenteric abscess with possible associated bowel perforation CT scan from 5/1 without enhancing collection but possible finding of fluid density consistent with patulous diverticulum.  General surgery and IR on board.  IR consulted for drain placement but has not been placed due to no drainable collection and possibility of diverticulum..  Continue empiric antibiotic with IV Zosyn.Marland Kitchen  Has been advanced to soft diet as per general surgery.  General surgery recommends repeating CT scan of the abdomen if not improving.   Possible GI bleed with Anemia of chronic renal disease  Patient had hemoglobin around  10.6, around 3 weeks prior to admission.  Hemoglobin dropped to 7.0.  Will transfuse as necessary for hemoglobin less than 7.  Hemoglobin today at 7.8.  Continue IV Protonix.  GI on board.  No plans for endoscopic evaluation at this time.  On review of CT scan from prior there is mention of duodenal diverticulosis.  We will continue to monitor closely.  Continue to hold aspirin from home.  ESRD on HD Tuesday, Thursday Saturday - Nephology on board for hemodialysis.  Essential hypertension-on hydralazine IV, labetalol IV as needed as needed.   Closely monitor blood pressure.  Will resume Imdur labetalol from home.  Continue to hold minoxidil.   Dyslipidemia -  Statins is on hold.  CAD -statins on hold.  Will resume Imdur and labetalol.. Aspirin on hold.  Debility, weakness.  Will get PT evaluation.  DVT prophylaxis: SCDs Start: 03/18/21 1207   Code Status: Full code  Family Communication:  I spoke with the patient's son and updated him about the clinical condition of the patient and answered the questions.  Status is: Inpatient  Remains inpatient appropriate because:IV treatments appropriate due to intensity of illness or inability to take PO and Inpatient level of care appropriate due to severity of illness, surgical/GI evaluation   Dispo: The patient is from: Home              Anticipated d/c is to: Home              Patient currently is not medically stable to d/c.   Difficult to place patient No  Consultants:  General surgery  Intervention radiology  Nephrology  GI  Procedures:  Hemodialysis  Anti-infectives:  Marland Kitchen Zosyn IV  Anti-infectives (From admission, onward)   Start     Dose/Rate Route Frequency Ordered Stop   03/21/21 1200  vancomycin (VANCOCIN) IVPB 500 mg/100 ml premix  Status:  Discontinued        500 mg 100 mL/hr over 60 Minutes Intravenous Every T-Th-Sa (Hemodialysis) 03/18/21 1229 03/21/21 0651   03/21/21 1200  vancomycin (VANCOCIN) IVPB 500  mg/100 ml premix  Status:  Discontinued        500 mg 100 mL/hr over 60 Minutes Intravenous Every T-Th-Sa (Hemodialysis) 03/21/21 0651 03/21/21 0724   03/20/21 1045  piperacillin-tazobactam (ZOSYN) 2.25 g in sodium chloride 0.9 % 50 mL IVPB        2.25 g 100 mL/hr over 30 Minutes Intravenous Every 8 hours 03/20/21 0945     03/18/21 1814  vancomycin (VANCOCIN) 1-5 GM/200ML-% IVPB       Note to Pharmacy: [name]   : cabinet override      03/18/21 1814 03/18/21 1819   03/18/21 1115  vancomycin (VANCOCIN) IVPB 1000 mg/200 mL premix        1,000 mg 200 mL/hr over 60 Minutes Intravenous  Once 03/18/21 1112 03/18/21 1919   03/18/21 1115  piperacillin-tazobactam (ZOSYN) IVPB 3.375 g  Status:  Discontinued        3.375 g 12.5 mL/hr over 240 Minutes Intravenous Every 12 hours 03/18/21 1112 03/20/21 0945      Subjective: Today, patient was seen and examined at bedside.  Complains of mild abdominal pain dark stool today.  Denies any nausea vomiting.    Objective: Vitals:   03/22/21 0319 03/22/21 0922  BP: (!) 150/55 (!) 148/53  Pulse: (!) 59 61  Resp: 16 18  Temp: 97.6 F (36.4 C) 97.8 F (36.6 C)  SpO2: 100% 100%    Intake/Output Summary (Last 24 hours) at 03/22/2021 1151 Last data filed at 03/22/2021 0930 Gross per 24 hour  Intake 640 ml  Output --  Net 640 ml   Filed Weights   03/18/21 1945 03/21/21 0750 03/21/21 1130  Weight: 54.7 kg 55.7 kg 54 kg   Body mass index is 20.43 kg/m.   Physical Exam: General:  Average built, not in obvious distress HENT:   No scleral pallor or icterus noted. Oral mucosa is moist.  Chest:  Clear breath sounds.  Diminished breath sounds bilaterally.  CVS: S1 &S2 heard. No murmur.  Regular rate and rhythm. Abdomen: Soft, diffuse nonspecific tenderness on palpation, nondistended.  Bowel sounds are heard.   Extremities: No cyanosis, clubbing or edema.  Peripheral pulses are palpable. Psych: Alert, awake and communicative, normal mood CNS:   No cranial nerve deficits.  Power equal in all extremities.   Skin: Warm and dry.  No rashes noted.  Data Review:  I have personally reviewed the following laboratory data and studies   CBC: Recent Labs  Lab 03/18/21 0245 03/19/21 0102 03/19/21 0635 03/19/21 1518 03/19/21 1846 03/20/21 0059 03/21/21 0230  WBC 20.3* 15.7*  --   --   --  19.2* 17.8*  NEUTROABS 18.2*  --   --   --   --   --   --   HGB 9.5* 7.0* 7.6* 8.2* 8.0* 8.1* 7.8*  HCT 30.2* 21.9* 23.9* 25.4* 25.3* 25.7* 24.0*  MCV 101.3* 100.9*  --   --   --  99.6 96.0  PLT 218 167  --   --   --  212 226  Basic Metabolic Panel: Recent Labs  Lab 03/18/21 0245 03/19/21 0102 03/20/21 0059 03/21/21 0230  NA 134* 135 133* 131*  K 3.5 3.6 3.8 3.8  CL 96* 99 97* 95*  CO2 27 27 24 25   GLUCOSE 118* 76 43* 97  BUN 33* 17 28* 35*  CREATININE 4.05* 2.48* 3.65* 4.36*  CALCIUM 9.3 8.5* 8.8* 8.5*  MG  --  2.0  --  2.2  PHOS  --   --   --  3.9   Liver Function Tests: Recent Labs  Lab 03/18/21 0245 03/19/21 0102 03/20/21 0059  AST 18 14* 14*  ALT 13 11 11   ALKPHOS 73 67 73  BILITOT 0.6 0.7 1.3*  PROT 6.0* 5.3* 6.1*  ALBUMIN 2.5* 2.0* 2.2*   No results for input(s): LIPASE, AMYLASE in the last 168 hours. No results for input(s): AMMONIA in the last 168 hours. Cardiac Enzymes: No results for input(s): CKTOTAL, CKMB, CKMBINDEX, TROPONINI in the last 168 hours. BNP (last 3 results) No results for input(s): BNP in the last 8760 hours.  ProBNP (last 3 results) No results for input(s): PROBNP in the last 8760 hours.  CBG: Recent Labs  Lab 03/20/21 0241 03/20/21 0317 03/20/21 1524 03/20/21 1959  GLUCAP 44* 136* 55* 155*   Recent Results (from the past 240 hour(s))  Resp Panel by RT-PCR (Flu A&B, Covid) Nasopharyngeal Swab     Status: None   Collection Time: 03/18/21  8:15 AM   Specimen: Nasopharyngeal Swab; Nasopharyngeal(NP) swabs in vial transport medium  Result Value Ref Range Status   SARS Coronavirus 2 by  RT PCR NEGATIVE NEGATIVE Final    Comment: (NOTE) SARS-CoV-2 target nucleic acids are NOT DETECTED.  The SARS-CoV-2 RNA is generally detectable in upper respiratory specimens during the acute phase of infection. The lowest concentration of SARS-CoV-2 viral copies this assay can detect is 138 copies/mL. A negative result does not preclude SARS-Cov-2 infection and should not be used as the sole basis for treatment or other patient management decisions. A negative result may occur with  improper specimen collection/handling, submission of specimen other than nasopharyngeal swab, presence of viral mutation(s) within the areas targeted by this assay, and inadequate number of viral copies(<138 copies/mL). A negative result must be combined with clinical observations, patient history, and epidemiological information. The expected result is Negative.  Fact Sheet for Patients:  EntrepreneurPulse.com.au  Fact Sheet for Healthcare Providers:  IncredibleEmployment.be  This test is no t yet approved or cleared by the [place] FDA and  has been authorized for detection and/or diagnosis of SARS-CoV-2 by FDA under an Emergency Use Authorization (EUA). This EUA will remain  in effect (meaning this test can be used) for the duration of the COVID-19 declaration under Section 564(b)(1) of the Act, 21 U.S.C.section 360bbb-3(b)(1), unless the authorization is terminated  or revoked sooner.       Influenza A by PCR NEGATIVE NEGATIVE Final   Influenza B by PCR NEGATIVE NEGATIVE Final    Comment: (NOTE) The Xpert Xpress SARS-CoV-2/FLU/RSV plus assay is intended as an aid in the diagnosis of influenza from Nasopharyngeal swab specimens and should not be used as a sole basis for treatment. Nasal washings and aspirates are unacceptable for Xpert Xpress SARS-CoV-2/FLU/RSV testing.  Fact Sheet for Patients: EntrepreneurPulse.com.au  Fact Sheet  for Healthcare Providers: IncredibleEmployment.be  This test is not yet approved or cleared by the [place] FDA and has been authorized for detection and/or diagnosis of SARS-CoV-2 by FDA under an Emergency Use  Authorization (EUA). This EUA will remain in effect (meaning this test can be used) for the duration of the COVID-19 declaration under Section 564(b)(1) of the Act, 21 U.S.C. section 360bbb-3(b)(1), unless the authorization is terminated or revoked.  Performed at [facility], [address] [place], [place] [address]      Studies: No results found.   Flora Lipps, MD  [facility] 03/22/2021  If 7PM-7AM, please contact night-coverage

## 2021-03-22 NOTE — Progress Notes (Addendum)
Attending physician's note   I have taken an interval history, reviewed the chart and examined the patient. I agree with the Advanced Practitioner's note, impression, and recommendations as outlined.   No new labs today for review.  H/H largely stable yesterday 7.8/24 (previously 8.1/25.7).  Still with abdominal pain, but also asking to increase diet (requesting sandwiches).  Still somewhat difficult clinical picture given imaging to date and exam findings.  We again discussed role of endoscopy for diagnostic and therapeutic intent.  Patient is quite wary of any endoscopic evaluation and again would like to hold off on any of these procedures.  Based on hemodynamic stability and most recent H/H, feel this is a reasonable decision.  In the meantime, will continue to treat as if she has PUD.  - Continue high-dose PPI - Continue antimicrobial therapy per primary services - Advancing diet per Surgical service   Gerrit Heck, DO, FACG [phone] office                                                                      Daily Rounding Note  03/22/2021, 11:11 AM  LOS: 4 days   SUBJECTIVE:   Chief complaint: Anemia.  FOBT positive.  Dark stool w halo of blood this AM.  Pain in left abdomen better.  No n/v.    OBJECTIVE:         Vital signs in last 24 hours:    Temp:  [97.3 F (36.3 C)-97.8 F (36.6 C)] 97.8 F (36.6 C) (05/04 0922) Pulse Rate:  [59-66] 61 (05/04 0922) Resp:  [16-20] 18 (05/04 0922) BP: (138-150)/(27-60) 148/53 (05/04 0922) SpO2:  [97 %-100 %] 100 % (05/04 0922) Weight:  [54 kg] 54 kg (05/03 1130) Last BM Date: 03/19/21 Filed Weights   03/18/21 1945 03/21/21 0750 03/21/21 1130  Weight: 54.7 kg 55.7 kg 54 kg   General: Looks chronically unwell. Heart: RRR.  Soft systolic murmur Chest: No labored breathing.  Lungs clear bilaterally. Abdomen: Soft.  Tender on the left without guarding or rebound.  Active bowel sounds.  No distention Extremities: No  CCE. Neuro/Psych: Oriented x3.  Appropriate.  Fluid speech.  Moves all 4 limbs.  Intake/Output from previous day: 05/03 0701 - 05/04 0700 In: 600 [P.O.:600] Out: 1500   Intake/Output this shift: Total I/O In: 120 [P.O.:120] Out: -   Lab Results: Recent Labs    03/19/21 1846 03/20/21 0059 03/21/21 0230  WBC  --  19.2* 17.8*  HGB 8.0* 8.1* 7.8*  HCT 25.3* 25.7* 24.0*  PLT  --  212 226   BMET Recent Labs    03/20/21 0059 03/21/21 0230  NA 133* 131*  K 3.8 3.8  CL 97* 95*  CO2 24 25  GLUCOSE 43* 97  BUN 28* 35*  CREATININE 3.65* 4.36*  CALCIUM 8.8* 8.5*   LFT Recent Labs    03/20/21 0059  PROT 6.1*  ALBUMIN 2.2*  AST 14*  ALT 11  ALKPHOS 73  BILITOT 1.3*   PT/INR No results for input(s): LABPROT, INR in the last 72 hours. Hepatitis Panel No results for input(s): HEPBSAG, HCVAB, HEPAIGM, HEPBIGM in the last 72 hours.  Studies/Results: No results found.   Scheduled Meds: . Chlorhexidine Gluconate Cloth  6 each  Topical Q0600  . cycloSPORINE  1 drop Both Eyes BID  . hydrALAZINE  10 mg Intravenous Q6H  . latanoprost  1 drop Both Eyes QHS  . pantoprazole (PROTONIX) IV  40 mg Intravenous Q12H  . sodium chloride flush  3 mL Intravenous Q12H   Continuous Infusions: . sodium chloride    . sodium chloride    . piperacillin-tazobactam (ZOSYN)  IV 2.25 g (03/22/21 0322)   PRN Meds:.sodium chloride, acetaminophen **OR** acetaminophen, HYDROmorphone (DILAUDID) injection, labetalol, ondansetron **OR** ondansetron (ZOFRAN) IV, sodium chloride flush   ASSESMENT:   *    Melena.  Anemia. Hgb 8.1 >> 7.8 over previous 24 hours.  No PRBCs to date.  *   Duodenal diverticulosis.  1 large tic measuring 2 x 1.2 cm.  Additional smaller tics. Initially CT reported mesenteric abscess but on repeat imaging these confirmed as diverticulosis. ?  Was there an abscess/microperforation from diverticulitis at the start which has improved?  Day 5 abx.   Improved abd pain, N/V.   Leukocytosis improved but WBC still 17.8..    *    12 mm diameter CBD but no evidence of CDL. CT confirms massive GB distention, periportal edema and CBD dilatation.  *   ESRD.  TTS HD.   PLAN   *   No plans at present for endoscopic investigation with its increased risk of diverticular perforation. Continue Protonix 40 IV bid.  Continue Zosyn CBC 5 PM, 5 AM.      Azucena Freed  03/22/2021, 11:11 AM Phone [phone]

## 2021-03-22 NOTE — Progress Notes (Signed)
Pharmacy Antibiotic Note  [name] is a 70 y.o. female admitted on 03/17/2021 with intra-abdominal infection.  Pharmacy has been consulted to transition Zosyn to Augmentin PO dosing. Noted ESRD on HD TTS.  Plan: Transition to Augmentin 500mg  PO q12h Monitor clinical progress, c/s, renal function F/u LOT   Height: 5\' 4"  (162.6 cm) Weight: 54 kg (119 lb 0.8 oz) IBW/kg (Calculated) : 54.7  Temp (24hrs), Avg:97.5 F (36.4 C), Min:97.3 F (36.3 C), Max:97.8 F (36.6 C)  Recent Labs  Lab 03/18/21 0245 03/18/21 0457 03/19/21 0102 03/19/21 1518 03/20/21 0059 03/21/21 0230  WBC 20.3*  --  15.7*  --  19.2* 17.8*  CREATININE 4.05*  --  2.48*  --  3.65* 4.36*  LATICACIDVEN  --  2.0*  --  1.2  --  0.8    Estimated Creatinine Clearance: 10.1 mL/min (A) (by C-G formula based on SCr of 4.36 mg/dL (H)).    Allergies  Allergen Reactions  . Dilaudid [Hydromorphone] Nausea And Vomiting  . Dilaudid  [Hydromorphone Hcl]   . Tape Rash    Plastic tape    Arturo Morton, PharmD, BCPS Please check AMION for all [place] contact numbers Clinical Pharmacist 03/22/2021 3:16 PM

## 2021-03-22 NOTE — Progress Notes (Signed)
[facility] NEPHROLOGY PROGRESS NOTE  Assessment/ Plan: 65F  with history of hypertension, HLD, anemia, acid reflux, ESRD on HD for last 23 years, TTS schedule at [facility], has right thigh AVG, transferred from [facility] for mesenteric abscess and possible bowel perforation, seen as a consultation for the management of ESRD.  #Mesenteric abscess vs  More likey duodenal diverticulosis; per GI and CCS.    # ESRD TTS at [facility]: On schedule. Volume status and electrolytes acceptable.  She has right thigh graft for the access.  No heparin.  Need outpatient treatment record.  Next HD on 5/5 3K, 3.5h, 1-2L UF, AVG, no heparin.  # Hypertension: Blood pressure and volume status acceptable.  Monitor BP.  Pain management.  # Anemia of ESRD: Hb stable 8.1.  No Fe while concern for infection. Transfuse blood as needed.  May need ESA, trying to obtain outpatient record.  # Metabolic Bone Disease: P at goal. Ca ok.  Subjective:  No c/o HD yesterday uneventful  Objective Vital signs in last 24 hours: Vitals:   03/21/21 1626 03/21/21 2014 03/22/21 0319 03/22/21 0922  BP: 138/60 140/60 (!) 150/55 (!) 148/53  Pulse: 66 64 (!) 59 61  Resp:  20 16 18   Temp:  (!) 97.3 F (36.3 C) 97.6 F (36.4 C) 97.8 F (36.6 C)  TempSrc:  Oral Oral Oral  SpO2: 100% 100% 100% 100%  Weight:      Height:       Weight change:   Intake/Output Summary (Last 24 hours) at 03/22/2021 1315 Last data filed at 03/22/2021 0930 Gross per 24 hour  Intake 640 ml  Output --  Net 640 ml       Labs: Basic Metabolic Panel: Recent Labs  Lab 03/19/21 0102 03/20/21 0059 03/21/21 0230  NA 135 133* 131*  K 3.6 3.8 3.8  CL 99 97* 95*  CO2 27 24 25   GLUCOSE 76 43* 97  BUN 17 28* 35*  CREATININE 2.48* 3.65* 4.36*  CALCIUM 8.5* 8.8* 8.5*  PHOS  --   --  3.9   Liver Function Tests: Recent Labs  Lab 03/18/21 0245 03/19/21 0102 03/20/21 0059  AST 18 14* 14*  ALT 13 11 11    ALKPHOS 73 67 73  BILITOT 0.6 0.7 1.3*  PROT 6.0* 5.3* 6.1*  ALBUMIN 2.5* 2.0* 2.2*   No results for input(s): LIPASE, AMYLASE in the last 168 hours. No results for input(s): AMMONIA in the last 168 hours. CBC: Recent Labs  Lab 03/18/21 0245 03/19/21 0102 03/19/21 0635 03/19/21 1846 03/20/21 0059 03/21/21 0230  WBC 20.3* 15.7*  --   --  19.2* 17.8*  NEUTROABS 18.2*  --   --   --   --   --   HGB 9.5* 7.0*   < > 8.0* 8.1* 7.8*  HCT 30.2* 21.9*   < > 25.3* 25.7* 24.0*  MCV 101.3* 100.9*  --   --  99.6 96.0  PLT 218 167  --   --  212 226   < > = values in this interval not displayed.   Cardiac Enzymes: No results for input(s): CKTOTAL, CKMB, CKMBINDEX, TROPONINI in the last 168 hours. CBG: Recent Labs  Lab 03/20/21 0241 03/20/21 0317 03/20/21 1524 03/20/21 1959  GLUCAP 44* 136* 55* 155*    Iron Studies: No results for input(s): IRON, TIBC, TRANSFERRIN, FERRITIN in the last 72 hours. Studies/Results: No results found.  Medications: Infusions: . sodium chloride    .  sodium chloride    . piperacillin-tazobactam (ZOSYN)  IV 2.25 g (03/22/21 0322)    Scheduled Medications: . Chlorhexidine Gluconate Cloth  6 each Topical Q0600  . cinacalcet  30 mg Oral QPC supper  . cycloSPORINE  1 drop Both Eyes BID  . docusate sodium  100 mg Oral Daily  . hydrALAZINE  10 mg Intravenous Q6H  . isosorbide mononitrate  30 mg Oral Daily  . labetalol  100 mg Oral BID  . latanoprost  1 drop Both Eyes QHS  . pantoprazole (PROTONIX) IV  40 mg Intravenous Q12H  . sodium chloride flush  3 mL Intravenous Q12H    have reviewed scheduled and prn medications.  Physical Exam: General:NAD, comfortable Heart:RRR, s1s2 nl Lungs:clear b/l, no crackle Abdomen:soft,  non-distended Extremities:No edema Dialysis Access: Right thigh AV graft has aneurysmal dilatation with good thrill and bruit.  Jayren Cease B Marsella Suman 03/22/2021,1:15 PM  LOS: 4 days

## 2021-03-22 NOTE — TOC Initial Note (Signed)
Transition of Care [facility]) - Initial/Assessment Note    Patient Details  Name: [name] MRN: [id number] Date of Birth: [birth year]  Transition of [facility]) CM/SW Contact:    Emeterio Reeve, [place] Phone Number: 03/22/2021, 2:08 PM  Clinical Narrative:                  CSW received phone call from pts son Elberta Fortis with questions about pts DC plan. Elberta Fortis states pt lives alone and family check on her 1/2 times a day. Elberta Fortis states pt is becoming slower and weaker at home and starting to require more assistance. Elberta Fortis worries that pt is not skilled enough to return home. CSW explained that PT will see pt and will make recommendations. Once recommendations are made they can discuss the next steps.   CSW poke to pt at bedside. Pt confirmed she was home alone and used a walker to get around the house. Pt reports she does require assistance with household chores and her family brings her food daily since its harder for her to cook. Pt reports she is covid vaccinated. Pt stated that she has went to [facility] in the past and feels like she may need to return. CSW explained that PT will see her and make recommendations and they will discuss further. Pt stated it is ok to talk to Elberta Fortis about DC planning but she makes her own decisions.   Expected Discharge Plan: Skilled Nursing Facility Barriers to Discharge: Continued Medical Work up,Insurance Authorization   Patient Goals and CMS Choice Patient states their goals for this hospitalization and ongoing recovery are:: TO get stronger CMS Medicare.gov Compare Post Acute Care list provided to:: Patient Choice offered to / list presented to : Patient  Expected Discharge Plan and Services Expected Discharge Plan: [place] arrangements for the past 2 months: Single Family Home                                      Prior Living Arrangements/Services Living arrangements for the past 2 months: Single Family  Home Lives with:: Self Patient language and need for interpreter reviewed:: Yes Do you feel safe going back to the place where you live?: Yes      Need for Family Participation in Patient Care: Yes (Comment) Care giver support system in place?: Yes (comment) Current home services: DME Criminal Activity/Legal Involvement Pertinent to Current Situation/Hospitalization: No - Comment as needed  Activities of Daily Living      Permission Sought/Granted Permission sought to share information with : Facility Retail banker granted to share information with : Yes, Verbal Permission Granted     Permission granted to share info w AGENCY: SNF  Permission granted to share info w Relationship: Son Elberta Fortis     Emotional Assessment Appearance:: Appears stated age Attitude/Demeanor/Rapport: Engaged Affect (typically observed): Appropriate,Accepting Orientation: : Oriented to Situation,Oriented to Self,Oriented to Place,Oriented to  Time Alcohol / Substance Use: Not Applicable Psych Involvement: No (comment)  Admission diagnosis:  Melena [K92.1] Perforation bowel ([place]) [K63.1] Mesenteric abscess ([place]) [K65.1] ESRD on dialysis ([place]) [N18.6, Z99.2] Leukocytosis, unspecified type [D72.829] Patient Active Problem List   Diagnosis Date Noted  . Melena   . Acute blood loss anemia   . Perforation bowel ([place]) 03/18/2021  . Mesenteric abscess ([place])   . Leukocytosis   . Hemodialysis patient ([place]) 12/23/2019  .  Osteoarthritis 12/23/2019  . Dilated gallbladder 07/09/2018  . Renal dialysis device, implant, or graft complication 96/78/9381  . Septic arthritis of shoulder, left ([place]) 11/20/2013  . Loss of weight 08/04/2013  . Red blood cell antibody positive 08/04/2013  . Chest pain 07/27/2013  . 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 Kidney transplant failure 07/27/2013  . Anemia 10/17/2011  . Polycystic kidney 10/17/2011  . End stage renal  disease ([place]) 07/26/2010  . Hyperlipidemia 08/18/2009  . Mitral valve regurgitation 08/18/2009  . TRICUSPID REGURGITATION, SEVERE 08/18/2009  . Essential hypertension 08/18/2009  . Coronary artery disease 08/18/2009  . CARDIOMYOPATHY, SECONDARY 08/18/2009  . SYSTOLIC HEART FAILURE, CHRONIC 08/18/2009   PCP:  Glenda Chroman, MD Pharmacy:   [facility] (ESRD Bundle Only) - [place], [address] [place] [address] Phone: [phone] Fax: [place] [address], [place] [address] Phone: [phone] Fax: [phone]     Social Determinants of Health (SDOH) Interventions    Readmission Risk Interventions No flowsheet data found.  Emeterio Reeve, Latanya Presser, [place] Social Worker [phone]

## 2021-03-22 NOTE — Care Management Important Message (Signed)
Important Message  Patient Details  Name: [name] MRN: [id number] Date of Birth: 1950/11/20   Medicare Important Message Given:  Yes     Orbie Pyo 03/22/2021, 3:29 PM

## 2021-03-22 NOTE — Progress Notes (Signed)
Progress Note     Subjective: CC: she is overall feeling poorly today which she states is normal for her following HD. Abdominal pain is stable, not worsening. She had a large bloody BM last night. She denies nausea or emesis   Objective: Vital signs in last 24 hours: Temp:  [97.3 F (36.3 C)-97.8 F (36.6 C)] 97.8 F (36.6 C) (05/04 0922) Pulse Rate:  [59-66] 61 (05/04 0922) Resp:  [16-20] 18 (05/04 0922) BP: (127-150)/(27-60) 148/53 (05/04 0922) SpO2:  [97 %-100 %] 100 % (05/04 0922) Weight:  [54 kg] 54 kg (05/03 1130) Last BM Date: 03/19/21  Intake/Output from previous day: 05/03 0701 - 05/04 0700 In: 600 [P.O.:600] Out: 1500  Intake/Output this shift: Total I/O In: 120 [P.O.:120] Out: -   Physical Exam:  General: pleasant, chronically ill appearing female who is sitting up in chair in NAD HEENT: head is normocephalic, atraumatic. Ears and nose without any masses or lesions.  Mouth is pink and moist Heart: regular, rate, and rhythm. Palpable radial pulses bilaterally Lungs: CTAB, no wheezes, rhonchi, or rales noted.  Respiratory effort nonlabored Abd: +BS, visible umbilical hernia which reduces easily. No distension. Diffuse mild tenderness to palpation - worst over suprapubic region. No peritonitis MS: all 4 extremities are symmetrical with no cyanosis, clubbing, or edema. Skin: warm and dry with no masses, lesions, or rashes Neuro: Cranial nerves 2-12 grossly intact, sensation is normal throughout Psych: A&Ox3 with an appropriate affect.  Lab Results:  Recent Labs    03/20/21 0059 03/21/21 0230  WBC 19.2* 17.8*  HGB 8.1* 7.8*  HCT 25.7* 24.0*  PLT 212 226   BMET Recent Labs    03/20/21 0059 03/21/21 0230  NA 133* 131*  K 3.8 3.8  CL 97* 95*  CO2 24 25  GLUCOSE 43* 97  BUN 28* 35*  CREATININE 3.65* 4.36*  CALCIUM 8.8* 8.5*   PT/INR No results for input(s): LABPROT, INR in the last 72 hours. CMP     Component Value Date/Time   NA 131 (L)  03/21/2021 0230   NA 128 (L) 10/12/2013 2231   K 3.8 03/21/2021 0230   K 5.1 10/12/2013 2231   CL 95 (L) 03/21/2021 0230   CL 90 (L) 10/12/2013 2231   CO2 25 03/21/2021 0230   CO2 28 10/12/2013 2231   GLUCOSE 97 03/21/2021 0230   GLUCOSE 436 (H) 10/12/2013 2231   BUN 35 (H) 03/21/2021 0230   BUN 78 (H) 10/12/2013 2231   CREATININE 4.36 (H) 03/21/2021 0230   CREATININE 5.60 (H) 10/12/2013 2231   CALCIUM 8.5 (L) 03/21/2021 0230   CALCIUM 9.7 10/12/2013 2231   PROT 6.1 (L) 03/20/2021 0059   ALBUMIN 2.2 (L) 03/20/2021 0059   AST 14 (L) 03/20/2021 0059   ALT 11 03/20/2021 0059   ALKPHOS 73 03/20/2021 0059   BILITOT 1.3 (H) 03/20/2021 0059   GFRNONAA 10 (L) 03/21/2021 0230   GFRNONAA 7 (L) 10/12/2013 2231   GFRAA 9 (L) 10/12/2013 2231   Lipase     Component Value Date/Time   LIPASE 28 02/20/2021 0705       Studies/Results: No results found.  Anti-infectives: Anti-infectives (From admission, onward)   Start     Dose/Rate Route Frequency Ordered Stop   03/21/21 1200  vancomycin (VANCOCIN) IVPB 500 mg/100 ml premix  Status:  Discontinued        500 mg 100 mL/hr over 60 Minutes Intravenous Every T-Th-Sa (Hemodialysis) 03/18/21 1229 03/21/21 0651   03/21/21 1200  vancomycin (VANCOCIN) IVPB 500 mg/100 ml premix  Status:  Discontinued        500 mg 100 mL/hr over 60 Minutes Intravenous Every T-Th-Sa (Hemodialysis) 03/21/21 0651 03/21/21 0724   03/20/21 1045  piperacillin-tazobactam (ZOSYN) 2.25 g in sodium chloride 0.9 % 50 mL IVPB        2.25 g 100 mL/hr over 30 Minutes Intravenous Every 8 hours 03/20/21 0945     03/18/21 1814  vancomycin (VANCOCIN) 1-5 GM/200ML-% IVPB       Note to Pharmacy: [name]   : cabinet override      03/18/21 1814 03/18/21 1819   03/18/21 1115  vancomycin (VANCOCIN) IVPB 1000 mg/200 mL premix        1,000 mg 200 mL/hr over 60 Minutes Intravenous  Once 03/18/21 1112 03/18/21 1919   03/18/21 1115  piperacillin-tazobactam (ZOSYN) IVPB 3.375 g   Status:  Discontinued        3.375 g 12.5 mL/hr over 240 Minutes Intravenous Every 12 hours 03/18/21 1112 03/20/21 0945       Assessment/Plan  ESRD on HD TTS  Acute on chronic blood loss anemia Heme positive stools - h/h 7.8/24.0 - GI consulted and recommending continued observation and H/H checks  Abdominal pain and leukocytosis  - initial outside CT with question of mesenteric abscess - repeat CT 5/1 now without enhancing collection, findings of fluid density consistent with patulous diverticulum - some ttp in RLQ but no peritonitis - no indication for surgical exploration at this time - having bowel function and tolerated FLD - ok to advance to Soft Diet from surgical standpoint  - Afeb, repeat CBC pending - consider repeat CT in a few days if not improving   FEN: soft  ID: [email or web address]  VTE: SCDs    LOS: 4 days    Winferd Humphrey, [facility] 03/22/2021, 10:54 AM Please see Amion for pager number during day hours 7:00am-4:30pm

## 2021-03-23 DIAGNOSIS — K651 Peritoneal abscess: Secondary | ICD-10-CM | POA: Diagnosis not present

## 2021-03-23 DIAGNOSIS — N186 End stage renal disease: Secondary | ICD-10-CM | POA: Diagnosis not present

## 2021-03-23 DIAGNOSIS — I5022 Chronic systolic (congestive) heart failure: Secondary | ICD-10-CM | POA: Diagnosis not present

## 2021-03-23 DIAGNOSIS — I10 Essential (primary) hypertension: Secondary | ICD-10-CM | POA: Diagnosis not present

## 2021-03-23 LAB — BPAM RBC
Blood Product Expiration Date: 202205072359
Blood Product Expiration Date: 202205102359
Blood Product Expiration Date: 202205222359
ISSUE DATE / TIME: 202205020910
Unit Type and Rh: 5100
Unit Type and Rh: 5100
Unit Type and Rh: 5100

## 2021-03-23 LAB — BASIC METABOLIC PANEL
Anion gap: 8 (ref 5–15)
BUN: 24 mg/dL — ABNORMAL HIGH (ref 8–23)
CO2: 26 mmol/L (ref 22–32)
Calcium: 8.1 mg/dL — ABNORMAL LOW (ref 8.9–10.3)
Chloride: 98 mmol/L (ref 98–111)
Creatinine, Ser: 4.04 mg/dL — ABNORMAL HIGH (ref 0.44–1.00)
GFR, Estimated: 11 mL/min — ABNORMAL LOW (ref >60)
Glucose, Bld: 109 mg/dL — ABNORMAL HIGH (ref 70–99)
Potassium: 3.5 mmol/L (ref 3.5–5.1)
Sodium: 132 mmol/L — ABNORMAL LOW (ref 135–145)

## 2021-03-23 LAB — CBC
HCT: 21.8 % — ABNORMAL LOW (ref 36.0–46.0)
Hemoglobin: 7 g/dL — ABNORMAL LOW (ref 12.0–15.0)
MCH: 31.1 pg (ref 26.0–34.0)
MCHC: 32.1 g/dL (ref 30.0–36.0)
MCV: 96.9 fL (ref 80.0–100.0)
Platelets: 193 10*3/uL (ref 150–400)
RBC: 2.25 MIL/uL — ABNORMAL LOW (ref 3.87–5.11)
RDW: 13.3 % (ref 11.5–15.5)
WBC: 8.7 10*3/uL (ref 4.0–10.5)
nRBC: 0 % (ref 0.0–0.2)

## 2021-03-23 LAB — HEMOGLOBIN AND HEMATOCRIT, BLOOD
HCT: 23.4 % — ABNORMAL LOW (ref 36.0–46.0)
HCT: 22.7 % — ABNORMAL LOW (ref 36.0–46.0)
Hemoglobin: 7.4 g/dL — ABNORMAL LOW (ref 12.0–15.0)
Hemoglobin: 7.2 g/dL — ABNORMAL LOW (ref 12.0–15.0)

## 2021-03-23 LAB — TYPE AND SCREEN
ABO/RH(D): O POS
Antibody Screen: POSITIVE
Donor AG Type: NEGATIVE
Donor AG Type: NEGATIVE
Donor AG Type: NEGATIVE
Unit division: 0
Unit division: 0
Unit division: 0

## 2021-03-23 LAB — MAGNESIUM: Magnesium: 2 mg/dL (ref 1.7–2.4)

## 2021-03-23 LAB — PHOSPHORUS: Phosphorus: 3.4 mg/dL (ref 2.5–4.6)

## 2021-03-23 MED ORDER — PANTOPRAZOLE SODIUM 40 MG PO TBEC
40.0000 mg | DELAYED_RELEASE_TABLET | Freq: Two times a day (BID) | ORAL | Status: DC
  Administered 2021-03-23 – 2021-03-30 (×11): 40 mg via ORAL
  Filled 2021-03-23 (×11): qty 1

## 2021-03-23 NOTE — Procedures (Signed)
I was present at this dialysis session. I have reviewed the session itself and made appropriate changes.   Sene on HD. 3K abth. UF goal 2L. Tolerating well.  BP stable on RA>    Filed Weights   03/21/21 0750 03/21/21 1130 03/23/21 0755  Weight: 55.7 kg 54 kg 56.4 kg    Recent Labs  Lab 03/23/21 0548  NA 132*  K 3.5  CL 98  CO2 26  GLUCOSE 109*  BUN 24*  CREATININE 4.04*  CALCIUM 8.1*  PHOS 3.4    Recent Labs  Lab 03/18/21 0245 03/19/21 0102 03/21/21 0230 03/22/21 1631 03/23/21 0548  WBC 20.3*   < > 17.8* 9.7 8.7  NEUTROABS 18.2*  --   --   --   --   HGB 9.5*   < > 7.8* 8.0* 7.0*  HCT 30.2*   < > 24.0* 25.2* 21.8*  MCV 101.3*   < > 96.0 98.4 96.9  PLT 218   < > 226 194 193   < > = values in this interval not displayed.    Scheduled Meds: . amoxicillin-clavulanate  1 tablet Oral Q12H  . Chlorhexidine Gluconate Cloth  6 each Topical Q0600  . cinacalcet  30 mg Oral QPC supper  . cycloSPORINE  1 drop Both Eyes BID  . docusate sodium  100 mg Oral Daily  . isosorbide mononitrate  30 mg Oral Daily  . labetalol  100 mg Oral BID  . latanoprost  1 drop Both Eyes QHS  . pantoprazole  40 mg Oral BID AC  . sodium chloride flush  3 mL Intravenous Q12H   Continuous Infusions: . sodium chloride    . sodium chloride     PRN Meds:.sodium chloride, acetaminophen **OR** acetaminophen, hydrALAZINE, HYDROmorphone (DILAUDID) injection, ondansetron **OR** ondansetron (ZOFRAN) IV, sodium chloride flush   Brittany Grippe  MD 03/23/2021, 9:04 AM

## 2021-03-23 NOTE — Progress Notes (Addendum)
PROGRESS NOTE  [name] VOJ:[id number] DOB: Apr 22, 1951 DOA: 03/17/2021 PCP: Glenda Chroman, MD   LOS: 5 days   Brief narrative:  Lemya Greenwell [name] a 70 y.o.femalewith medical history significant forESRD on HD Tuesday Thursday Saturday, essential hypertension, dyslipidemia, chronic anemia and GERD presented to hospital from [facility] with worsening abdominal distention and pain with black tarry stools for 2 weeks with impaired appetite as well.  In the ED patient had elevated white count at 20.3 and creatinine at 44 ESRD.  He was given vancomycin and Zosyn initially.  CT scan of the abdomen pelvis showed some large collection of fluid and gas and debris's consistent with mesenteric abscess likely related to small bowel perforation.  Patient was then admitted to hospital for further evaluation and treatment.  Subsequently, IR was consulted for possible IR guided drain but there was no drainable collection noted.  Due to lack tarry stools GI consultation was made.  Assessment/Plan:  Principal Problem:   Mesenteric abscess (HCC) Active Problems:   Hyperlipidemia   Mitral valve regurgitation   Essential hypertension   Coronary artery disease   SYSTOLIC HEART FAILURE, CHRONIC   ESRD on dialysis ([place])   Perforation bowel (HCC)   Melena   Acute blood loss anemia   Generalized abdominal pain  Suspected mesenteric abscess with possible associated bowel perforation CT scan from 5/1 without enhancing collection but possible finding of fluid density consistent with patulous diverticulum.  General surgery and IR on board. Was on IV Zosyn and  now on augmentin.  On soft diet.  WBC has trended down.  Patient is clinically improving with no leukocytosis and tolerating oral diet.  No surgical intervention as per surgery.   GI bleed with Anemia of chronic renal disease  Patient had hemoglobin around 10.6, around 3 weeks prior to admission.  Hemoglobin has dropped to 7.0.  Will transfuse as  necessary for hemoglobin less than 7.    GI on board.  Patient does have ongoing melena.  On review of CT scan from prior there is mention of duodenal diverticulum.  We will continue to monitor closely.  Continue to hold aspirin from home.  ESRD on HD Tuesday, Thursday Saturday - Nephology on board for hemodialysis.  Essential hypertension- resumed Imdur labetalol from home.  Will resume minoxidil as well.  Dyslipidemia -  Statins is on hold.  CAD -statins on hold. Resumed Imdur and labetalol. Aspirin on hold.  Debility, weakness.  Will get PT evaluation. Pending.  DVT prophylaxis: SCDs Start: 03/18/21 1207  Code Status:  Full code  Family Communication:  None today.  I spoke with the patient's son yesterday  Status is: Inpatient  Remains inpatient appropriate because:IV treatments appropriate due to intensity of illness or inability to take PO and Inpatient level of care appropriate due to severity of illness   Dispo: The patient is from: Home              Anticipated d/c is to: Home with home health versus rehabilitation.  Pending physical therapy evaluation.              Patient currently is not medically stable to d/c.   Difficult to place patient No  Consultants:  General surgery  Intervention radiology  Nephrology  GI  Procedures:  Hemodialysis  Anti-infectives:  Marland Kitchen Augmentin 5/4>  Anti-infectives (From admission, onward)   Start     Dose/Rate Route Frequency Ordered Stop   03/22/21 2200  amoxicillin-clavulanate (AUGMENTIN) 500-125 MG per  tablet 500 mg        1 tablet Oral Every 12 hours 03/22/21 1518     03/21/21 1200  vancomycin (VANCOCIN) IVPB 500 mg/100 ml premix  Status:  Discontinued        500 mg 100 mL/hr over 60 Minutes Intravenous Every T-Th-Sa (Hemodialysis) 03/18/21 1229 03/21/21 0651   03/21/21 1200  vancomycin (VANCOCIN) IVPB 500 mg/100 ml premix  Status:  Discontinued        500 mg 100 mL/hr over 60 Minutes Intravenous Every T-Th-Sa  (Hemodialysis) 03/21/21 0651 03/21/21 0724   03/20/21 1045  piperacillin-tazobactam (ZOSYN) 2.25 g in sodium chloride 0.9 % 50 mL IVPB  Status:  Discontinued        2.25 g 100 mL/hr over 30 Minutes Intravenous Every 8 hours 03/20/21 0945 03/22/21 1510   03/18/21 1814  vancomycin (VANCOCIN) 1-5 GM/200ML-% IVPB       Note to Pharmacy: [name]   : cabinet override      03/18/21 1814 03/18/21 1819   03/18/21 1115  vancomycin (VANCOCIN) IVPB 1000 mg/200 mL premix        1,000 mg 200 mL/hr over 60 Minutes Intravenous  Once 03/18/21 1112 03/18/21 1919   03/18/21 1115  piperacillin-tazobactam (ZOSYN) IVPB 3.375 g  Status:  Discontinued        3.375 g 12.5 mL/hr over 240 Minutes Intravenous Every 12 hours 03/18/21 1112 03/20/21 0945      Subjective: Today, patient was seen and examined at bedside.  Complains of mild lower abdominal pain.  Has had black stools this morning.  Denies any nausea or vomiting.  Objective: Vitals:   03/23/21 1125 03/23/21 1130  BP: (!) 140/41 (!) 161/43  Pulse: 61   Resp: (!) 21 (!) 26  Temp: 98.1 F (36.7 C)   SpO2: 98%     Intake/Output Summary (Last 24 hours) at 03/23/2021 1206 Last data filed at 03/23/2021 1125 Gross per 24 hour  Intake 0 ml  Output 2000 ml  Net -2000 ml   Filed Weights   03/21/21 1130 03/23/21 0755 03/23/21 1125  Weight: 54 kg 56.4 kg 54 kg   Body mass index is 20.43 kg/m.   Physical Exam:  General:  Average built, not in obvious distress, chronically ill female HENT:   No scleral pallor or icterus noted. Oral mucosa is moist.  Chest:  Clear breath sounds.  Diminished breath sounds bilaterally.  CVS: S1 &S2 heard. No murmur.  Regular rate and rhythm. Abdomen: Soft, diffuse nonspecific tenderness on palpation, nondistended.  Bowel sounds are heard.  Umbilical hernia which is reducible. Extremities: No cyanosis, clubbing or edema.  Peripheral pulses are palpable.  Right femoral hemodialysis access. Psych: Alert, awake and  communicative, normal mood CNS:  No cranial nerve deficits.  Power equal in all extremities.   Skin: Warm and dry.  No rashes noted.  Data Review:  I have personally reviewed the following laboratory data and studies   CBC: Recent Labs  Lab 03/18/21 0245 03/19/21 0102 03/19/21 2878 03/19/21 1846 03/20/21 0059 03/21/21 0230 03/22/21 1631 03/23/21 0548  WBC 20.3* 15.7*  --   --  19.2* 17.8* 9.7 8.7  NEUTROABS 18.2*  --   --   --   --   --   --   --   HGB 9.5* 7.0*   < > 8.0* 8.1* 7.8* 8.0* 7.0*  HCT 30.2* 21.9*   < > 25.3* 25.7* 24.0* 25.2* 21.8*  MCV 101.3* 100.9*  --   --  99.6 96.0 98.4 96.9  PLT 218 167  --   --  212 226 194 193   < > = values in this interval not displayed.   Basic Metabolic Panel: Recent Labs  Lab 03/18/21 0245 03/19/21 0102 03/20/21 0059 03/21/21 0230 03/23/21 0548  NA 134* 135 133* 131* 132*  K 3.5 3.6 3.8 3.8 3.5  CL 96* 99 97* 95* 98  CO2 27 27 24 25 26   GLUCOSE 118* 76 43* 97 109*  BUN 33* 17 28* 35* 24*  CREATININE 4.05* 2.48* 3.65* 4.36* 4.04*  CALCIUM 9.3 8.5* 8.8* 8.5* 8.1*  MG  --  2.0  --  2.2 2.0  PHOS  --   --   --  3.9 3.4   Liver Function Tests: Recent Labs  Lab 03/18/21 0245 03/19/21 0102 03/20/21 0059  AST 18 14* 14*  ALT 13 11 11   ALKPHOS 73 67 73  BILITOT 0.6 0.7 1.3*  PROT 6.0* 5.3* 6.1*  ALBUMIN 2.5* 2.0* 2.2*   No results for input(s): LIPASE, AMYLASE in the last 168 hours. No results for input(s): AMMONIA in the last 168 hours. Cardiac Enzymes: No results for input(s): CKTOTAL, CKMB, CKMBINDEX, TROPONINI in the last 168 hours. BNP (last 3 results) No results for input(s): BNP in the last 8760 hours.  ProBNP (last 3 results) No results for input(s): PROBNP in the last 8760 hours.  CBG: Recent Labs  Lab 03/20/21 0241 03/20/21 0317 03/20/21 1524 03/20/21 1959  GLUCAP 44* 136* 55* 155*   Recent Results (from the past 240 hour(s))  Resp Panel by RT-PCR (Flu A&B, Covid) Nasopharyngeal Swab     Status:  None   Collection Time: 03/18/21  8:15 AM   Specimen: Nasopharyngeal Swab; Nasopharyngeal(NP) swabs in vial transport medium  Result Value Ref Range Status   SARS Coronavirus 2 by RT PCR NEGATIVE NEGATIVE Final    Comment: (NOTE) SARS-CoV-2 target nucleic acids are NOT DETECTED.  The SARS-CoV-2 RNA is generally detectable in upper respiratory specimens during the acute phase of infection. The lowest concentration of SARS-CoV-2 viral copies this assay can detect is 138 copies/mL. A negative result does not preclude SARS-Cov-2 infection and should not be used as the sole basis for treatment or other patient management decisions. A negative result may occur with  improper specimen collection/handling, submission of specimen other than nasopharyngeal swab, presence of viral mutation(s) within the areas targeted by this assay, and inadequate number of viral copies(<138 copies/mL). A negative result must be combined with clinical observations, patient history, and epidemiological information. The expected result is Negative.  Fact Sheet for Patients:  EntrepreneurPulse.com.au  Fact Sheet for Healthcare Providers:  IncredibleEmployment.be  This test is no t yet approved or cleared by the [place] FDA and  has been authorized for detection and/or diagnosis of SARS-CoV-2 by FDA under an Emergency Use Authorization (EUA). This EUA will remain  in effect (meaning this test can be used) for the duration of the COVID-19 declaration under Section 564(b)(1) of the Act, 21 U.S.C.section 360bbb-3(b)(1), unless the authorization is terminated  or revoked sooner.       Influenza A by PCR NEGATIVE NEGATIVE Final   Influenza B by PCR NEGATIVE NEGATIVE Final    Comment: (NOTE) The Xpert Xpress SARS-CoV-2/FLU/RSV plus assay is intended as an aid in the diagnosis of influenza from Nasopharyngeal swab specimens and should not be used as a sole basis for  treatment. Nasal washings and aspirates are unacceptable for Xpert Xpress SARS-CoV-2/FLU/RSV testing.  Fact Sheet for Patients: EntrepreneurPulse.com.au  Fact Sheet for Healthcare Providers: IncredibleEmployment.be  This test is not yet approved or cleared by the [place] FDA and has been authorized for detection and/or diagnosis of SARS-CoV-2 by FDA under an Emergency Use Authorization (EUA). This EUA will remain in effect (meaning this test can be used) for the duration of the COVID-19 declaration under Section 564(b)(1) of the Act, 21 U.S.C. section 360bbb-3(b)(1), unless the authorization is terminated or revoked.  Performed at [facility], [address] [place], [place] [address]      Studies: No results found.   Flora Lipps, MD  [facility] 03/23/2021  If 7PM-7AM, please contact night-coverage

## 2021-03-23 NOTE — Progress Notes (Signed)
Progress Note     Subjective: CC: reports her abd pain is a little better today but still does have some lower abd pain. Tolerating solid food - states eating does not increase her pain. Reports black stool this AM. Denies nausea or emesis. Patient confirmed that GI had talked to her about endoscopy and she was not interested in that currently - despite ongoing evidence of UGIB.     Objective: Vital signs in last 24 hours: Temp:  [97.3 F (36.3 C)-97.6 F (36.4 C)] 97.5 F (36.4 C) (05/05 0755) Pulse Rate:  [56-68] 63 (05/05 1000) Resp:  [16-18] 16 (05/05 0755) BP: (112-170)/(35-59) 129/45 (05/05 1000) SpO2:  [98 %-100 %] 98 % (05/05 0755) Weight:  [56.4 kg] 56.4 kg (05/05 0755) Last BM Date: 03/22/21  Intake/Output from previous day: 05/04 0701 - 05/05 0700 In: 220 [P.O.:220] Out: -  Intake/Output this shift: No intake/output data recorded.  Physical Exam:  General: pleasant, chronically ill appearing female who is sitting up in chair in NAD HEENT: head is normocephalic, atraumatic. Ears and nose without any masses or lesions.  Mouth is pink and moist Heart: regular, rate, and rhythm. Palpable radial pulses bilaterally Lungs: CTAB, no wheezes, rhonchi, or rales noted.  Respiratory effort nonlabored Abd: +BS, visible umbilical hernia which reduces easily. No distension. Diffuse mild tenderness to palpation - No peritonitis MS: all 4 extremities are symmetrical with no cyanosis, clubbing, or edema. Skin: warm and dry with no masses, lesions, or rashes Neuro: Cranial nerves 2-12 grossly intact, sensation is normal throughout Psych: A&Ox3 with an appropriate affect.  Lab Results:  Recent Labs    03/22/21 1631 03/23/21 0548  WBC 9.7 8.7  HGB 8.0* 7.0*  HCT 25.2* 21.8*  PLT 194 193   BMET Recent Labs    03/21/21 0230 03/23/21 0548  NA 131* 132*  K 3.8 3.5  CL 95* 98  CO2 25 26  GLUCOSE 97 109*  BUN 35* 24*  CREATININE 4.36* 4.04*  CALCIUM 8.5* 8.1*    PT/INR No results for input(s): LABPROT, INR in the last 72 hours. CMP     Component Value Date/Time   NA 132 (L) 03/23/2021 0548   NA 128 (L) 10/12/2013 2231   K 3.5 03/23/2021 0548   K 5.1 10/12/2013 2231   CL 98 03/23/2021 0548   CL 90 (L) 10/12/2013 2231   CO2 26 03/23/2021 0548   CO2 28 10/12/2013 2231   GLUCOSE 109 (H) 03/23/2021 0548   GLUCOSE 436 (H) 10/12/2013 2231   BUN 24 (H) 03/23/2021 0548   BUN 78 (H) 10/12/2013 2231   CREATININE 4.04 (H) 03/23/2021 0548   CREATININE 5.60 (H) 10/12/2013 2231   CALCIUM 8.1 (L) 03/23/2021 0548   CALCIUM 9.7 10/12/2013 2231   PROT 6.1 (L) 03/20/2021 0059   ALBUMIN 2.2 (L) 03/20/2021 0059   AST 14 (L) 03/20/2021 0059   ALT 11 03/20/2021 0059   ALKPHOS 73 03/20/2021 0059   BILITOT 1.3 (H) 03/20/2021 0059   GFRNONAA 11 (L) 03/23/2021 0548   GFRNONAA 7 (L) 10/12/2013 2231   GFRAA 9 (L) 10/12/2013 2231   Lipase     Component Value Date/Time   LIPASE 28 02/20/2021 0705       Studies/Results: No results found.  Anti-infectives: Anti-infectives (From admission, onward)   Start     Dose/Rate Route Frequency Ordered Stop   03/22/21 2200  amoxicillin-clavulanate (AUGMENTIN) 500-125 MG per tablet 500 mg        1  tablet Oral Every 12 hours 03/22/21 1518     03/21/21 1200  vancomycin (VANCOCIN) IVPB 500 mg/100 ml premix  Status:  Discontinued        500 mg 100 mL/hr over 60 Minutes Intravenous Every T-Th-Sa (Hemodialysis) 03/18/21 1229 03/21/21 0651   03/21/21 1200  vancomycin (VANCOCIN) IVPB 500 mg/100 ml premix  Status:  Discontinued        500 mg 100 mL/hr over 60 Minutes Intravenous Every T-Th-Sa (Hemodialysis) 03/21/21 0651 03/21/21 0724   03/20/21 1045  piperacillin-tazobactam (ZOSYN) 2.25 g in sodium chloride 0.9 % 50 mL IVPB  Status:  Discontinued        2.25 g 100 mL/hr over 30 Minutes Intravenous Every 8 hours 03/20/21 0945 03/22/21 1510   03/18/21 1814  vancomycin (VANCOCIN) 1-5 GM/200ML-% IVPB       Note to  Pharmacy: [name]   : cabinet override      03/18/21 1814 03/18/21 1819   03/18/21 1115  vancomycin (VANCOCIN) IVPB 1000 mg/200 mL premix        1,000 mg 200 mL/hr over 60 Minutes Intravenous  Once 03/18/21 1112 03/18/21 1919   03/18/21 1115  piperacillin-tazobactam (ZOSYN) IVPB 3.375 g  Status:  Discontinued        3.375 g 12.5 mL/hr over 240 Minutes Intravenous Every 12 hours 03/18/21 1112 03/20/21 0945       Assessment/Plan  ESRD on HD TTS  Acute on chronic blood loss anemia Heme positive stools - h/h 7.0/21.8 from 7.8/24.0 - GI consulted and recommending continued observation and H/H checks - possible endoscopic evaluation for melena per GI and patient  Abdominal pain and leukocytosis  - initial outside CT with question of mesenteric abscess - repeat CT 5/1 now without enhancing collection, findings of fluid density consistent with patulous diverticulum - mild global abdominal pain improving, tolerating SOFT diet without increase in pain.  - at this point the patient is clinically improving with a normal WBC, less pain, and toleration of PO intake. She does have ongoing melena and H&H slowly downtrending. Given that there is a possible UGIB with no known source and no current emergent/urgent need for surgery, CCS will sign off. Please re-consult [place] should surgical concerns arise.  FEN: soft  ID: [email or web address]  VTE: SCDs    LOS: 5 days    Jill Alexanders, [facility] Surgery 03/23/2021, 10:48 AM Please see Amion for pager number during day hours 7:00am-4:30pm

## 2021-03-23 NOTE — Evaluation (Signed)
Physical Therapy Evaluation Patient Details Name: [name] MRN: [id number] DOB: Mar 19, 1951 Today's Date: 03/23/2021   History of Present Illness  Pt is a 70 y.o. female who presented 4/29 with worsening abdominal distention and pain along with black, tarry stool. Pt with a GI bleed. Imaging not entirely clear if this was a mesenteric abscess at the beginning or if this really was duodenal diverticulum. PMH: ESRD on HD Tuesday Thursday Saturday, HTN, chronic anemia, GERD, mitral valve and aortic valve insufficiency, CAD, and chronic systolic heart failure.    Clinical Impression  Pt presents with condition above and deficits mentioned below, see PT Problem List. PTA, she was mod I with functional mobility, ambulating household distances with a SPC inside the home and RW outside the home. Her family would perform the grocery shopping and provide transportation, but pt was otherwise mod I. Pt reports a recent decline in strength and function over the past ~6 months. Currently, pt is at risk for falls, demonstrating impaired balance, generalized weakness, and decreased activity tolerance, ambulating at a significantly slow speed. Pt also displays limitations in sensation distally in her limbs and limitations in ROM, primarily in her R UE. Currently, pt is requiring min guard assist for mobility and short bedroom distance gait bouts with a RW. Pt does not have 24/7 assistance available at home and thus could benefit from a short-term stay at a SNF for skilled PT services to reduce her risk for falls and improve her functional mobility independence and safety. Discussed the need to make a plan for d/c following PT at the SNF for d/c home alone, home with a family member, or to an ALF based on how she progresses there and discussed the costs of custodial care. Pt requesting d/c to SNF instead of HHPT due to limitations in caregiver support. Will continue to follow acutely.     Follow Up Recommendations  SNF;Supervision for mobility/OOB (HHPT if pt could get level of care needed)    Equipment Recommendations  None recommended by PT    Recommendations for Other Services OT consult     Precautions / Restrictions Precautions Precautions: Fall Restrictions Weight Bearing Restrictions: No      Mobility  Bed Mobility Overal bed mobility: Modified Independent             General bed mobility comments: Pt able to transition supine > sit with HOB elevated and use of bed rail safely with extra time.    Transfers Overall transfer level: Needs assistance Equipment used: Rolling walker (2 wheeled) Transfers: Sit to/from Stand Sit to Stand: Min guard         General transfer comment: Pt requiring min guard for safety, needing extra time to power up to stand and achieve upright posture.  Ambulation/Gait Ambulation/Gait assistance: Min guard Gait Distance (Feet): 20 Feet (x2 bouts of ~20 ft each bout) Assistive device: Rolling walker (2 wheeled) Gait Pattern/deviations: Step-through pattern;Decreased step length - right;Decreased step length - left;Decreased stride length;Trunk flexed Gait velocity: reduced Gait velocity interpretation: <1.31 ft/sec, indicative of household ambulator General Gait Details: Pt with slow, mildly unsteady gait. Pt with decreased bil step length and kyphotic posture. No overt LOB, min guard for safety.  Stairs            Wheelchair Mobility    Modified Rankin (Stroke Patients Only)       Balance Overall balance assessment: Needs assistance Sitting-balance support: No upper extremity supported;Feet supported Sitting balance-Leahy Scale: Good Sitting balance - Comments:  Pt able to donn socks EOB via crossing legs with supervision and extra time.   Standing balance support: No upper extremity supported;Single extremity supported;Bilateral upper extremity supported Standing balance-Leahy Scale: Fair Standing balance comment: Pt needing  1-2 UE support majority of time, able to reach min off BOS to assist with pericare and lower body dressing, min guard for safety.                             Pertinent Vitals/Pain Pain Assessment: 0-10 Pain Score: 5  Pain Location: head and abdomen Pain Descriptors / Indicators: Headache;Discomfort;Grimacing;Guarding Pain Intervention(s): Limited activity within patient's tolerance;Monitored during session;Repositioned    Home Living Family/patient expects to be discharged to:: Private residence Living Arrangements: Alone Available Help at Discharge: Family;Available PRN/intermittently Type of Home: Mobile home Home Access: Ramped entrance     Home Layout: One level Home Equipment: Bedside commode;Walker - 2 wheels;Walker - 4 wheels;Cane - quad      Prior Function Level of Independence: Independent with assistive device(s)         Comments: Pt uses RW for mobility outside and SPC inside home as trailer is "tight" fitting. Pt reports she ambulates short distances, such as down ramp to car or back into house, before she fatigues. Pt stopped driving ~6 months ago after cataract surgery. Sons drive pt now. Pt reporting increased weakness over past 6 months. Family does the shopping, but pt manages her own finances, meds, appointments and all other ADLs.     Hand Dominance   Dominant Hand: Left    Extremity/Trunk Assessment   Upper Extremity Assessment Upper Extremity Assessment: RUE deficits/detail;LUE deficits/detail RUE Deficits / Details: Decreased 1st and 2nd finger extension ROM, with resting position in flexion; Decreased R shoulder PROM and AROM flexion to </= 75 degrees with noted crepitus in shoulder; MMT scores of 4- to 4 grossly throughout RUE Sensation: decreased light touch (at palmar surface of middle finger and thumb, intact at 5th digit) LUE Deficits / Details: MMT scores of 4- to 4 grossly throughout; Increased PROM and AROM into shoulder flexion  compared to R, but AROM reaches ~110 degrees and PROM reaches ~120 degrees LUE Sensation: decreased light touch (at palmar surface of middle finger and thumb, intact at 5th digit)    Lower Extremity Assessment Lower Extremity Assessment: RLE deficits/detail;LLE deficits/detail RLE Deficits / Details: MMT scores of 4- to 4 grossly throughout RLE Sensation: decreased light touch (at dorsal big toe) LLE Deficits / Details: MMT scores of 4- to 4 grossly throughout LLE Sensation: decreased light touch (at dorsal big toe)    Cervical / Trunk Assessment Cervical / Trunk Assessment: Kyphotic  Communication   Communication: No difficulties  Cognition Arousal/Alertness: Awake/alert Behavior During Therapy: WFL for tasks assessed/performed Overall Cognitive Status: Within Functional Limits for tasks assessed                                 General Comments: A&Ox4.      General Comments General comments (skin integrity, edema, etc.): Pt with poor vision and sensativity to light secondary to cataracts, per pt    Exercises     Assessment/Plan    PT Assessment Patient needs continued PT services  PT Problem List Decreased strength;Decreased range of motion;Decreased activity tolerance;Decreased balance;Decreased mobility;Decreased coordination;Impaired sensation       PT Treatment Interventions DME instruction;Gait training;Functional mobility training;Therapeutic activities;Therapeutic  exercise;Balance training;Neuromuscular re-education;Patient/family education    PT Goals (Current goals can be found in the Care Plan section)  Acute Rehab PT Goals Patient Stated Goal: to get therapy to get stronger PT Goal Formulation: With patient Time For Goal Achievement: 04/06/21 Potential to Achieve Goals: Good    Frequency Min 2X/week   Barriers to discharge Decreased caregiver support      Co-evaluation               AM-PAC PT "6 Clicks" Mobility  Outcome Measure  Help needed turning from your back to your side while in a flat bed without using bedrails?: A Little Help needed moving from lying on your back to sitting on the side of a flat bed without using bedrails?: A Little Help needed moving to and from a bed to a chair (including a wheelchair)?: A Little Help needed standing up from a chair using your arms (e.g., wheelchair or bedside chair)?: A Little Help needed to walk in hospital room?: A Little Help needed climbing 3-5 steps with a railing? : A Little 6 Click Score: 18    End of Session Equipment Utilized During Treatment: Gait belt Activity Tolerance: Patient tolerated treatment well Patient left: in chair;with call bell/phone within reach;with nursing/sitter in room (RN reported no need for chair alarm) Nurse Communication: Mobility status;Other (comment) (bowel movement info) PT Visit Diagnosis: Unsteadiness on feet (R26.81);Other abnormalities of gait and mobility (R26.89);Muscle weakness (generalized) (M62.81);Difficulty in walking, not elsewhere classified (R26.2)    Time: 6644-0347 PT Time Calculation (min) (ACUTE ONLY): 38 min   Charges:   PT Evaluation $PT Eval Moderate Complexity: 1 Mod PT Treatments $Gait Training: 8-22 mins $Therapeutic Activity: 8-22 mins        Moishe Spice, PT, DPT Acute Rehabilitation Services  Pager: [phone] Office: [phone]   Orvan Falconer 03/23/2021, 4:54 PM

## 2021-03-23 NOTE — Progress Notes (Signed)
PT Cancellation Note  Patient Details Name: LINZEE DEPAUL MRN: [id number] DOB: [birth year]   Cancelled Treatment:    Reason Eval/Treat Not Completed: Patient at procedure or test/unavailable. Pt in HD. PT to re-attempt as time allows.   Lorriane Shire 03/23/2021, 9:17 AM  Lorrin Goodell, PT  Office # [phone] Pager [phone]

## 2021-03-24 DIAGNOSIS — K921 Melena: Secondary | ICD-10-CM | POA: Diagnosis not present

## 2021-03-24 DIAGNOSIS — R1084 Generalized abdominal pain: Secondary | ICD-10-CM | POA: Diagnosis not present

## 2021-03-24 DIAGNOSIS — I5022 Chronic systolic (congestive) heart failure: Secondary | ICD-10-CM | POA: Diagnosis not present

## 2021-03-24 DIAGNOSIS — K651 Peritoneal abscess: Secondary | ICD-10-CM | POA: Diagnosis not present

## 2021-03-24 DIAGNOSIS — I10 Essential (primary) hypertension: Secondary | ICD-10-CM | POA: Diagnosis not present

## 2021-03-24 DIAGNOSIS — D62 Acute posthemorrhagic anemia: Secondary | ICD-10-CM | POA: Diagnosis not present

## 2021-03-24 DIAGNOSIS — N186 End stage renal disease: Secondary | ICD-10-CM | POA: Diagnosis not present

## 2021-03-24 LAB — HEMOGLOBIN AND HEMATOCRIT, BLOOD
HCT: 21.2 % — ABNORMAL LOW (ref 36.0–46.0)
Hemoglobin: 6.6 g/dL — CL (ref 12.0–15.0)

## 2021-03-24 LAB — CBC WITH DIFFERENTIAL/PLATELET
Abs Immature Granulocytes: 0.05 10*3/uL (ref 0.00–0.07)
Basophils Absolute: 0.1 10*3/uL (ref 0.0–0.1)
Basophils Relative: 1 %
Eosinophils Absolute: 0.2 10*3/uL (ref 0.0–0.5)
Eosinophils Relative: 2 %
HCT: 28.7 % — ABNORMAL LOW (ref 36.0–46.0)
Hemoglobin: 9.3 g/dL — ABNORMAL LOW (ref 12.0–15.0)
Immature Granulocytes: 1 %
Lymphocytes Relative: 14 %
Lymphs Abs: 1.3 10*3/uL (ref 0.7–4.0)
MCH: 30.5 pg (ref 26.0–34.0)
MCHC: 32.4 g/dL (ref 30.0–36.0)
MCV: 94.1 fL (ref 80.0–100.0)
Monocytes Absolute: 0.6 10*3/uL (ref 0.1–1.0)
Monocytes Relative: 6 %
Neutro Abs: 7.3 10*3/uL (ref 1.7–7.7)
Neutrophils Relative %: 76 %
Platelets: 188 10*3/uL (ref 150–400)
RBC: 3.05 MIL/uL — ABNORMAL LOW (ref 3.87–5.11)
RDW: 17.3 % — ABNORMAL HIGH (ref 11.5–15.5)
WBC: 9.5 10*3/uL (ref 4.0–10.5)
nRBC: 0 % (ref 0.0–0.2)

## 2021-03-24 LAB — PREPARE RBC (CROSSMATCH)

## 2021-03-24 MED ORDER — SODIUM CHLORIDE 0.9 % IV SOLN: INTRAVENOUS | Status: DC

## 2021-03-24 MED ORDER — TRAMADOL HCL 50 MG PO TABS: 50.0000 mg | ORAL_TABLET | Freq: Every day | ORAL | Status: DC | PRN

## 2021-03-24 MED ORDER — MINOXIDIL 2.5 MG PO TABS
2.5000 mg | ORAL_TABLET | Freq: Two times a day (BID) | ORAL | Status: DC
  Administered 2021-03-24 – 2021-03-30 (×11): 2.5 mg via ORAL
  Filled 2021-03-24 (×14): qty 1

## 2021-03-24 MED ORDER — SODIUM CHLORIDE 0.9% IV SOLUTION: Freq: Once | INTRAVENOUS | Status: AC

## 2021-03-24 NOTE — Progress Notes (Signed)
[facility] NEPHROLOGY PROGRESS NOTE  Assessment/ Plan: 63F  with history of hypertension, HLD, anemia, acid reflux, ESRD on HD for last 23 years, TTS schedule at [facility], has right thigh AVG, transferred from [facility] for mesenteric abscess and possible bowel perforation, seen as a consultation for the management of ESRD.  #Mesenteric abscess vs  More likey duodenal diverticulosis; per GI and CCS.    # ESRD TTS at [facility]: On schedule. Volume status and electrolytes acceptable.  She has right thigh graft for the access.  No heparin.  Need outpatient treatment record.  Next HD on 5/7 3K, 3.5h, 1-2L UF, AVG, no heparin.  # Hypertension: Stable BPs, CTM.    # Anemia of ESRD: Hb stable down to 6.6, today; transfusion per prmary.  Give ESA with HD tomorrow.  # Metabolic Bone Disease: P at goal. Ca ok.  Subjective:  No c/o HD yesterday uneventful For SNF  Objective Vital signs in last 24 hours: Vitals:   03/23/21 1130 03/23/21 1226 03/23/21 2027 03/24/21 0614  BP: (!) 161/43 (!) 152/52 (!) 151/41 (!) 156/39  Pulse:  62 (!) 59 (!) 59  Resp: (!) 26 19 16 17   Temp:  98.2 F (36.8 C) 97.8 F (36.6 C) 98 F (36.7 C)  TempSrc:  Oral Oral   SpO2:   100% 100%  Weight:      Height:       Weight change:   Intake/Output Summary (Last 24 hours) at 03/24/2021 1257 Last data filed at 03/23/2021 1619 Gross per 24 hour  Intake 240 ml  Output 1 ml  Net 239 ml       Labs: Basic Metabolic Panel: Recent Labs  Lab 03/20/21 0059 03/21/21 0230 03/23/21 0548  NA 133* 131* 132*  K 3.8 3.8 3.5  CL 97* 95* 98  CO2 24 25 26   GLUCOSE 43* 97 109*  BUN 28* 35* 24*  CREATININE 3.65* 4.36* 4.04*  CALCIUM 8.8* 8.5* 8.1*  PHOS  --  3.9 3.4   Liver Function Tests: Recent Labs  Lab 03/18/21 0245 03/19/21 0102 03/20/21 0059  AST 18 14* 14*  ALT 13 11 11   ALKPHOS 73 67 73  BILITOT 0.6 0.7 1.3*  PROT 6.0* 5.3* 6.1*  ALBUMIN 2.5* 2.0* 2.2*   No  results for input(s): LIPASE, AMYLASE in the last 168 hours. No results for input(s): AMMONIA in the last 168 hours. CBC: Recent Labs  Lab 03/18/21 0245 03/19/21 0102 03/19/21 6720 03/20/21 0059 03/21/21 0230 03/22/21 1631 03/23/21 0548 03/23/21 1351 03/23/21 1648 03/24/21 0602  WBC 20.3* 15.7*  --  19.2* 17.8* 9.7 8.7  --   --   --   NEUTROABS 18.2*  --   --   --   --   --   --   --   --   --   HGB 9.5* 7.0*   < > 8.1* 7.8* 8.0* 7.0* 7.4* 7.2* 6.6*  HCT 30.2* 21.9*   < > 25.7* 24.0* 25.2* 21.8* 23.4* 22.7* 21.2*  MCV 101.3* 100.9*  --  99.6 96.0 98.4 96.9  --   --   --   PLT 218 167  --  212 226 194 193  --   --   --    < > = values in this interval not displayed.   Cardiac Enzymes: No results for input(s): CKTOTAL, CKMB, CKMBINDEX, TROPONINI in the last 168 hours. CBG: Recent Labs  Lab 03/20/21 0241 03/20/21 0317 03/20/21  1524 03/20/21 1959  GLUCAP 44* 136* 55* 155*    Iron Studies: No results for input(s): IRON, TIBC, TRANSFERRIN, FERRITIN in the last 72 hours. Studies/Results: No results found.  Medications: Infusions: . sodium chloride    . sodium chloride      Scheduled Medications: . amoxicillin-clavulanate  1 tablet Oral Q12H  . Chlorhexidine Gluconate Cloth  6 each Topical Q0600  . cinacalcet  30 mg Oral QPC supper  . cycloSPORINE  1 drop Both Eyes BID  . docusate sodium  100 mg Oral Daily  . isosorbide mononitrate  30 mg Oral Daily  . labetalol  100 mg Oral BID  . latanoprost  1 drop Both Eyes QHS  . pantoprazole  40 mg Oral BID AC  . sodium chloride flush  3 mL Intravenous Q12H    have reviewed scheduled and prn medications.  Physical Exam: General:NAD, comfortable Heart:RRR, s1s2 nl Lungs:clear b/l, no crackle Abdomen:soft,  non-distended Extremities:No edema Dialysis Access: Right thigh AV graft has aneurysmal dilatation with good thrill and bruit.  Cataleia Gade B Dandre Sisler 03/24/2021,12:57 PM  LOS: 6 days

## 2021-03-24 NOTE — H&P (View-Only) (Signed)
Attending physician's note   I have taken an interval history, reviewed the chart and examined the patient. I agree with the Advanced Practitioner's note, impression, and recommendations as outlined.  H/H down trended at 6.6/21.2 today.  Transfusing 1 unit PRBCs.  Stools brown per discussion with nursing staff and patient denies overt bleeding.  Did have dark stools earlier on this admission, and given downtrending H/H with need for blood transfusion, we had a long discussion today regarding endoscopic evaluation for diagnostic and therapeutic intent.  She would like to proceed with upper endoscopy, but hold off on colonoscopy (bowel prep) unless EGD normal.  - Continue high-dose PPI as prescribed - N.p.o. at midnight - Plan for EGD tomorrow for diagnostic and therapeutic intent - We discussed the risk, benefits, alternatives of EGD at length today, and she consents to proceed - Alonza Bogus updated her son, Elberta Fortis, by phone and he agrees - Continue antimicrobial therapy per primary service   [address] [place], [place] [phone] office             [facility] Progress Note  CC:  Anemia  Subjective:  GI reconsulted for worsening anemia.  Hgb down to 6.6 grams today, requiring transfusion, which she is waiting on.  She says that her BMs were brown today.    Objective:  Vital signs in last 24 hours: Temp:  [97.8 F (36.6 C)-98.2 F (36.8 C)] 98 F (36.7 C) (05/06 0614) Pulse Rate:  [59-64] 59 (05/06 0614) Resp:  [16-26] 17 (05/06 0614) BP: (127-161)/(39-52) 156/39 (05/06 0614) SpO2:  [98 %-100 %] 100 % (05/06 0614) Weight:  [54 kg] 54 kg (05/05 1125) Last BM Date: 03/23/21 General:  Alert, Well-developed, in NAD Heart:  Regular rate and rhythm; no murmurs Pulm:  CTAB.  No W/R/R. Abdomen:  Soft, non-distended.  BS present.  TTP mostly in the RLQ. Extremities:  Without edema. Neurologic:  Alert and oriented x 4;  grossly normal  neurologically.  Intake/Output from previous day: 05/05 0701 - 05/06 0700 In: 480 [P.O.:480] Out: 2001 [Stool:1]  Lab Results: Recent Labs    03/22/21 1631 03/23/21 0548 03/23/21 1351 03/23/21 1648 03/24/21 0602  WBC 9.7 8.7  --   --   --   HGB 8.0* 7.0* 7.4* 7.2* 6.6*  HCT 25.2* 21.8* 23.4* 22.7* 21.2*  PLT 194 193  --   --   --    BMET Recent Labs    03/23/21 0548  NA 132*  K 3.5  CL 98  CO2 26  GLUCOSE 109*  BUN 24*  CREATININE 4.04*  CALCIUM 8.1*   Assessment / Plan: *    Melena.  Anemia.  Hgb continues to trend down.  Stools brown today per her report. Hgb 8.1 >> 7.8>>7.2>>6.6 this AM.  Going to receive 1 unit of PRBCs.  *   Duodenal diverticulosis.  1 large tic measuring 2 x 1.2 cm.  Additional smaller tics. Initially CT reported mesenteric abscess but on repeat imaging these confirmed as diverticulosis. ?  Was there an abscess/microperforation from diverticulitis at the start which has improved?  Day 7 abx.   Improved abd pain, N/V, but still tender on exam.  Leukocytosis resolved.  *    12 mm diameter CBD but no evidence of CDL. CT confirms massive GB distention, periportal edema and CBD dilatation.  *   ESRD.  TTS HD.  -On Pantoprazole 40 mg BID. -Monitor Hgb and transfuse further prn. -Currently patient is agreeable to starting with EGD  only as evaluation.   **I spoke with her son, Elberta Fortis, by phone and updated him on the plan from a GI standpoint.    LOS: 6 days   Laban Emperor. Zehr  03/24/2021, 10:42 AM

## 2021-03-24 NOTE — NC FL2 (Signed)
[place] LEVEL OF CARE SCREENING TOOL     IDENTIFICATION  Patient Name: [name] Birthdate: [birth year] Sex: female Admission Date (Current Location): 03/17/2021  [facility] and [place] Number:  Herbalist and Address:  [facility], [place] [address], [place], [place] [address]      Provider Number: [id number]  Attending Physician Name and Address:  Flora Lipps, MD  Relative Name and Phone Number:  Amado Nash, [phone]    Current Level of Care: Hospital Recommended Level of Care: [place] Prior Approval Number:    Date Approved/Denied:   PASRR Number: [id number]  Discharge Plan: SNF    Current Diagnoses: Patient Active Problem List   Diagnosis Date Noted  . Generalized abdominal pain   . Melena   . Acute blood loss anemia   . Perforation bowel ([place]) 03/18/2021  . Mesenteric abscess ([place])   . Leukocytosis   . Hemodialysis patient ([place]) 12/23/2019  . Osteoarthritis 12/23/2019  . Dilated gallbladder 07/09/2018  . Renal dialysis device, implant, or graft complication 68/34/1962  . Septic arthritis of shoulder, left ([place]) 11/20/2013  . Loss of weight 08/04/2013  . Red blood cell antibody positive 08/04/2013  . Chest pain 07/27/2013  . 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 Kidney transplant failure 07/27/2013  . Anemia 10/17/2011  . Polycystic kidney 10/17/2011  . ESRD on dialysis ([place]) 07/26/2010  . Hyperlipidemia 08/18/2009  . Mitral valve regurgitation 08/18/2009  . TRICUSPID REGURGITATION, SEVERE 08/18/2009  . Essential hypertension 08/18/2009  . Coronary artery disease 08/18/2009  . CARDIOMYOPATHY, SECONDARY 08/18/2009  . SYSTOLIC HEART FAILURE, CHRONIC 08/18/2009    Orientation RESPIRATION BLADDER Height & Weight     Self,Time,Situation,Place  Normal Continent Weight: 119 lb 0.8 oz (54 kg) Height:  5\' 4"  (162.6 cm)  BEHAVIORAL SYMPTOMS/MOOD NEUROLOGICAL BOWEL  NUTRITION STATUS      Continent Diet (See DC summary)  AMBULATORY STATUS COMMUNICATION OF NEEDS Skin   Limited Assist Verbally Normal                       Personal Care Assistance Level of Assistance  Bathing,Feeding,Dressing Bathing Assistance: Maximum assistance Feeding assistance: Limited assistance Dressing Assistance: Maximum assistance     Functional Limitations Info  Sight,Hearing,Speech Sight Info: Impaired Hearing Info: Adequate Speech Info: Adequate    SPECIAL CARE FACTORS FREQUENCY  PT (By licensed PT),OT (By licensed OT)     PT Frequency: 5x week OT Frequency: 5x week            Contractures Contractures Info: Not present    Additional Factors Info  Code Status,Allergies Code Status Info: Full Allergies Info: Dilaudid, Tape           Current Medications (03/24/2021):  This is the current hospital active medication list Current Facility-Administered Medications  Medication Dose Route Frequency Provider Last Rate Last Admin  . 0.9 %  sodium chloride infusion  10 mL/hr Intravenous Once Manuella Ghazi, Pratik D, DO      . 0.9 %  sodium chloride infusion  250 mL Intravenous PRN Manuella Ghazi, Pratik D, DO      . acetaminophen (TYLENOL) tablet 650 mg  650 mg Oral Q6H PRN Heath Lark D, DO   650 mg at 03/21/21 2018   Or  . acetaminophen (TYLENOL) suppository 650 mg  650 mg Rectal Q6H PRN Manuella Ghazi, Pratik D, DO      . amoxicillin-clavulanate (AUGMENTIN) 500-125 MG per tablet 500 mg  1 tablet Oral Q12H von Verdene Rio B, RPH   500 mg at 03/24/21 0834  . Chlorhexidine Gluconate Cloth 2 % PADS 6 each  6 each Topical Q0600 Rosita Fire, MD   6 each at 03/24/21 [id number]  . cinacalcet (SENSIPAR) tablet 30 mg  30 mg Oral QPC supper Pokhrel, Laxman, MD   30 mg at 03/23/21 1746  . cycloSPORINE (RESTASIS) 0.05 % ophthalmic emulsion 1 drop  1 drop Both Eyes BID [name] A, DO   1 drop at 03/24/21 0834  . docusate sodium (COLACE) capsule 100 mg  100 mg Oral Daily Pokhrel,  Laxman, MD   100 mg at 03/24/21 0834  . hydrALAZINE (APRESOLINE) injection 10 mg  10 mg Intravenous Q6H PRN Pokhrel, Laxman, MD      . HYDROmorphone (DILAUDID) injection 0.5-1 mg  0.5-1 mg Intravenous Q2H PRN Manuella Ghazi, Pratik D, DO      . isosorbide mononitrate (IMDUR) 24 hr tablet 30 mg  30 mg Oral Daily Pokhrel, Laxman, MD   30 mg at 03/24/21 0834  . labetalol (NORMODYNE) tablet 100 mg  100 mg Oral BID Pokhrel, Laxman, MD   100 mg at 03/24/21 0834  . latanoprost (XALATAN) 0.005 % ophthalmic solution 1 drop  1 drop Both Eyes QHS [name] A, DO   1 drop at 03/23/21 2159  . ondansetron (ZOFRAN) tablet 4 mg  4 mg Oral Q6H PRN Manuella Ghazi, Pratik D, DO       Or  . ondansetron (ZOFRAN) injection 4 mg  4 mg Intravenous Q6H PRN Manuella Ghazi, Pratik D, DO      . pantoprazole (PROTONIX) EC tablet 40 mg  40 mg Oral BID AC Pokhrel, Laxman, MD   40 mg at 03/24/21 0834  . sodium chloride flush (NS) 0.9 % injection 3 mL  3 mL Intravenous Q12H Shah, Pratik D, DO   3 mL at 03/24/21 1227  . sodium chloride flush (NS) 0.9 % injection 3 mL  3 mL Intravenous PRN Heath Lark D, DO         Discharge Medications: Please see discharge summary for a list of discharge medications.  Relevant Imaging Results:  Relevant Lab Results:   Additional Information SS# [id number], [place]

## 2021-03-24 NOTE — Evaluation (Signed)
Occupational Therapy Evaluation Patient Details Name: [name] MRN: [id number] DOB: 07/03/1951 Today's Date: 03/24/2021    History of Present Illness Pt is a 70 y.o. female who presented 4/29 with worsening abdominal distention and pain along with black, tarry stool. Pt with a GI bleed. Imaging not entirely clear if this was a mesenteric abscess at the beginning or if this really was duodenal diverticulum. PMH: ESRD on HD Tuesday Thursday Saturday, HTN, chronic anemia, GERD, mitral valve and aortic valve insufficiency, CAD, and chronic systolic heart failure.   Clinical Impression   PTA, pt was living alone and was independent with ADLs and using RW; reports she was doing IADLs "as much as I can" and that friends/family help with groceries and driving. Pt presenting with decreased strength and activity tolerance. Pt performing toileting, grooming, and functional mobility with Min Guard A and RW.  Pt would benefit from further acute OT to facilitate safe dc. Due to limited home support, safety, and activity tolerance, pt would benefit from post-acute rehab to optimize safety and independent with ADLs and mobility.     Follow Up Recommendations  SNF (Need to discuss furture plan for at home care as pt needing more assistance for IADLs and safety)    Equipment Recommendations  None recommended by OT    Recommendations for Other Services PT consult     Precautions / Restrictions Precautions Precautions: Fall      Mobility Bed Mobility Overal bed mobility: Modified Independent             General bed mobility comments: Increased time    Transfers Overall transfer level: Needs assistance Equipment used: Rolling walker (2 wheeled) Transfers: Sit to/from Stand Sit to Stand: Min guard         General transfer comment: Min Guard A for safety    Balance Overall balance assessment: Needs assistance Sitting-balance support: No upper extremity supported;Feet supported Sitting  balance-Leahy Scale: Good Sitting balance - Comments: Able to adjust socks without LOB   Standing balance support: No upper extremity supported;Bilateral upper extremity supported;During functional activity Standing balance-Leahy Scale: Fair Standing balance comment: Standing for hand hygiene                           ADL either performed or assessed with clinical judgement   ADL Overall ADL's : Needs assistance/impaired Eating/Feeding: NPO   Grooming: Standing;Min guard;Wash/dry hands   Upper Body Bathing: Set up;Supervision/ safety;Sitting   Lower Body Bathing: Min guard;Sit to/from stand   Upper Body Dressing : Set up;Supervision/safety;Sitting   Lower Body Dressing: Min guard;Sit to/from stand   Toilet Transfer: Min guard;Ambulation;BSC (BSC over toilet)   Toileting- Clothing Manipulation and Hygiene: Supervision/safety;Sit to/from stand       Functional mobility during ADLs: Min guard;Rolling walker General ADL Comments: Pt presenting with decreased activity tolerance and fatigue with ADLs     Vision Baseline Vision/History: Wears glasses Wears Glasses: At all times Patient Visual Report: No change from baseline Additional Comments: Reports she is having difficulty keeping her eyes open     Perception     Praxis      Pertinent Vitals/Pain Pain Assessment: Faces Faces Pain Scale: Hurts a little bit Pain Location: Head Pain Descriptors / Indicators: Headache;Discomfort;Grimacing;Guarding Pain Intervention(s): Monitored during session;Limited activity within patient's tolerance;Repositioned     Hand Dominance Left   Extremity/Trunk Assessment Upper Extremity Assessment Upper Extremity Assessment: RUE deficits/detail;Generalized weakness RUE Deficits / Details: Tendency for flexion  of 1-2 fingers; able to perform limited extention. Pt reports arthritis. RUE Sensation: decreased light touch RUE Coordination: decreased fine motor   Lower Extremity  Assessment Lower Extremity Assessment: Defer to PT evaluation   Cervical / Trunk Assessment Cervical / Trunk Assessment: Kyphotic   Communication Communication Communication: No difficulties   Cognition Arousal/Alertness: Awake/alert Behavior During Therapy: WFL for tasks assessed/performed Overall Cognitive Status: Within Functional Limits for tasks assessed                                 General Comments: At times, pt requiring increased time and with poro awareness of environment. I.e. dumping water from cup on floor as she wiped her mouth nad did not notice.   General Comments  Daughter in law present    Exercises     Shoulder Instructions      Home Living Family/patient expects to be discharged to:: Private residence Living Arrangements: Alone Available Help at Discharge: Family;Available PRN/intermittently Type of Home: Mobile home Home Access: Ramped entrance     Home Layout: One level     Bathroom Shower/Tub: Teacher, early years/pre: Handicapped height (BSC over toilet)     Home Equipment: Bedside commode;Walker - 2 wheels;Walker - 4 wheels;Cane - quad          Prior Functioning/Environment Level of Independence: Independent with assistive device(s)        Comments: Uses RW. Takes bird bathes at sink. Performs ADLs and AIDLS but difficulty "I do what little bit I can" Family and friends bring groceries. Does not drive.        OT Problem List: Decreased strength;Decreased range of motion;Decreased activity tolerance;Impaired balance (sitting and/or standing);Decreased safety awareness;Decreased knowledge of use of DME or AE;Decreased knowledge of precautions;Pain      OT Treatment/Interventions: Self-care/ADL training;Therapeutic exercise;Energy conservation;DME and/or AE instruction;Therapeutic activities;Patient/family education    OT Goals(Current goals can be found in the care plan section) Acute Rehab OT Goals Patient  Stated Goal: Return home OT Goal Formulation: With patient/family Time For Goal Achievement: 04/07/21 Potential to Achieve Goals: Good  OT Frequency: Min 2X/week   Barriers to D/C:            Co-evaluation              AM-PAC OT "6 Clicks" Daily Activity     Outcome Measure Help from another person eating meals?: None Help from another person taking care of personal grooming?: A Little Help from another person toileting, which includes using toliet, bedpan, or urinal?: A Little Help from another person bathing (including washing, rinsing, drying)?: A Little Help from another person to put on and taking off regular upper body clothing?: A Little Help from another person to put on and taking off regular lower body clothing?: A Little 6 Click Score: 19   End of Session Equipment Utilized During Treatment: Rolling walker Nurse Communication: Mobility status  Activity Tolerance: Patient tolerated treatment well Patient left: in chair;with call bell/phone within reach;with family/visitor present;with nursing/sitter in room  OT Visit Diagnosis: Other abnormalities of gait and mobility (R26.89);Unsteadiness on feet (R26.81);Muscle weakness (generalized) (M62.81)                Time: 8416-6063 OT Time Calculation (min): 27 min Charges:  OT General Charges $OT Visit: 1 Visit OT Evaluation $OT Eval Low Complexity: 1 Low OT Treatments $Self Care/Home Management : 8-22 mins  [place],  OTR/L Acute Rehab Pager: [phone] Office: [place] 03/24/2021, 11:44 AM

## 2021-03-24 NOTE — Progress Notes (Addendum)
Attending physician's note   I have taken an interval history, reviewed the chart and examined the patient. I agree with the Advanced Practitioner's note, impression, and recommendations as outlined.  H/H down trended at 6.6/21.2 today.  Transfusing 1 unit PRBCs.  Stools brown per discussion with nursing staff and patient denies overt bleeding.  Did have dark stools earlier on this admission, and given downtrending H/H with need for blood transfusion, we had a long discussion today regarding endoscopic evaluation for diagnostic and therapeutic intent.  She would like to proceed with upper endoscopy, but hold off on colonoscopy (bowel prep) unless EGD normal.  - Continue high-dose PPI as prescribed - N.p.o. at midnight - Plan for EGD tomorrow for diagnostic and therapeutic intent - We discussed the risk, benefits, alternatives of EGD at length today, and she consents to proceed - Alonza Bogus updated her son, Elberta Fortis, by phone and he agrees - Continue antimicrobial therapy per primary service   [address], [place], [place] [phone] office             [facility] Progress Note  CC:  Anemia  Subjective:  GI reconsulted for worsening anemia.  Hgb down to 6.6 grams today, requiring transfusion, which she is waiting on.  She says that her BMs were brown today.    Objective:  Vital signs in last 24 hours: Temp:  [97.8 F (36.6 C)-98.2 F (36.8 C)] 98 F (36.7 C) (05/06 0614) Pulse Rate:  [59-64] 59 (05/06 0614) Resp:  [16-26] 17 (05/06 0614) BP: (127-161)/(39-52) 156/39 (05/06 0614) SpO2:  [98 %-100 %] 100 % (05/06 0614) Weight:  [54 kg] 54 kg (05/05 1125) Last BM Date: 03/23/21 General:  Alert, Well-developed, in NAD Heart:  Regular rate and rhythm; no murmurs Pulm:  CTAB.  No W/R/R. Abdomen:  Soft, non-distended.  BS present.  TTP mostly in the RLQ. Extremities:  Without edema. Neurologic:  Alert and oriented x 4;  grossly normal  neurologically.  Intake/Output from previous day: 05/05 0701 - 05/06 0700 In: 480 [P.O.:480] Out: 2001 [Stool:1]  Lab Results: Recent Labs    03/22/21 1631 03/23/21 0548 03/23/21 1351 03/23/21 1648 03/24/21 0602  WBC 9.7 8.7  --   --   --   HGB 8.0* 7.0* 7.4* 7.2* 6.6*  HCT 25.2* 21.8* 23.4* 22.7* 21.2*  PLT 194 193  --   --   --    BMET Recent Labs    03/23/21 0548  NA 132*  K 3.5  CL 98  CO2 26  GLUCOSE 109*  BUN 24*  CREATININE 4.04*  CALCIUM 8.1*   Assessment / Plan: *    Melena.  Anemia.  Hgb continues to trend down.  Stools brown today per her report. Hgb 8.1 >> 7.8>>7.2>>6.6 this AM.  Going to receive 1 unit of PRBCs.  *   Duodenal diverticulosis.  1 large tic measuring 2 x 1.2 cm.  Additional smaller tics. Initially CT reported mesenteric abscess but on repeat imaging these confirmed as diverticulosis. ?  Was there an abscess/microperforation from diverticulitis at the start which has improved?  Day 7 abx.   Improved abd pain, N/V, but still tender on exam.  Leukocytosis resolved.  *    12 mm diameter CBD but no evidence of CDL. CT confirms massive GB distention, periportal edema and CBD dilatation.  *   ESRD.  TTS HD.  -On Pantoprazole 40 mg BID. -Monitor Hgb and transfuse further prn. -Currently patient is agreeable to starting with EGD  only as evaluation.   **I spoke with her son, Elberta Fortis, by phone and updated him on the plan from a GI standpoint.    LOS: 6 days   Laban Emperor. Zehr  03/24/2021, 10:42 AM

## 2021-03-24 NOTE — Progress Notes (Addendum)
PROGRESS NOTE  [name] CHE:[id number] DOB: 10-Sep-1951 DOA: 03/17/2021 PCP: Glenda Chroman, MD   LOS: 6 days   Brief narrative:  Brittany Elbe [name] a 70 y.o.femalewith medical history significant forESRD on HD Tuesday Thursday Saturday, essential hypertension, dyslipidemia, chronic anemia and GERD presented to hospital from [facility] with worsening abdominal distention and pain with black tarry stools for 2 weeks with impaired appetite as well.  In the ED patient had elevated white count at 20.3 and creatinine at 44 ESRD.  He was given vancomycin and Zosyn initially.  CT scan of the abdomen pelvis showed some large collection of fluid and gas and debris's consistent with mesenteric abscess likely related to small bowel perforation.  Patient was then admitted to hospital for further evaluation and treatment.  Subsequently, IR was consulted for possible IR guided drain but there was no drainable collection noted.  Due to black tarry stools GI consultation was made.  GI recommended conservative treatment due to duodenal diverticulum.  Patient did have a drop in hemoglobin and will be transfused today.  GI has been reconsulted.  Assessment/Plan:  Principal Problem:   Mesenteric abscess (HCC) Active Problems:   Hyperlipidemia   Mitral valve regurgitation   Essential hypertension   Coronary artery disease   SYSTOLIC HEART FAILURE, CHRONIC   ESRD on dialysis ([place])   Perforation bowel (HCC)   Melena   Acute blood loss anemia   Generalized abdominal pain  Suspected mesenteric abscess with possible associated bowel perforation CT scan from 03/19/21 without enhancing collection but possible finding of fluid density consistent with patulous diverticulum.  General surgery on board.  Was on IV Zosyn and  now on augmentin.  On soft diet and has been tolerating.  Leukocytosis trending down. No surgical intervention as per surgery.   GI bleed with Anemia of chronic renal disease  Patient had  hemoglobin around 10.6, around 3 weeks prior to admission.  Hemoglobin has dropped to 6.8.  Will transfuse 1 unit of packed RBC today.  GI was already notified.  Continue to hold aspirin from home.  Might need EGD for evaluation.  ESRD on HD Tuesday, Thursday Saturday - Nephology on board for hemodialysis.   Essential hypertension- resumed Imdur, labetalol minoxidil  Dyslipidemia -  Statins is on hold.  CAD -statins on hold.  Continue Imdur and labetalol. Aspirin on hold.  Debility, weakness.  Patient was seen by physical therapy recommended skilled nursing facility placement.  Transition of care has been consulted.  DVT prophylaxis: SCDs Start: 03/18/21 1207  Code Status:  Full code  Family Communication:  None today.  Spoke with the patient's on 5/ 4.  Status is: Inpatient  Remains inpatient appropriate because:IV treatments appropriate due to intensity of illness or inability to take PO and Inpatient level of care appropriate due to severity of illness, PRBC transfusion, possible endoscopic evaluation.  Dispo: The patient is from: Home              Anticipated d/c is to: Skilled nursing facility placement as per PT recommendation.              Patient currently is not medically stable to d/c.   Difficult to place patient No  Consultants:  General surgery  Intervention radiology  Nephrology  GI  Procedures:  Hemodialysis   Anti-infectives:  Marland Kitchen Augmentin 5/4>  Anti-infectives (From admission, onward)   Start     Dose/Rate Route Frequency Ordered Stop   03/22/21 2200  amoxicillin-clavulanate (AUGMENTIN)  500-125 MG per tablet 500 mg        1 tablet Oral Every 12 hours 03/22/21 1518     03/21/21 1200  vancomycin (VANCOCIN) IVPB 500 mg/100 ml premix  Status:  Discontinued        500 mg 100 mL/hr over 60 Minutes Intravenous Every T-Th-Sa (Hemodialysis) 03/18/21 1229 03/21/21 0651   03/21/21 1200  vancomycin (VANCOCIN) IVPB 500 mg/100 ml premix  Status:   Discontinued        500 mg 100 mL/hr over 60 Minutes Intravenous Every T-Th-Sa (Hemodialysis) 03/21/21 0651 03/21/21 0724   03/20/21 1045  piperacillin-tazobactam (ZOSYN) 2.25 g in sodium chloride 0.9 % 50 mL IVPB  Status:  Discontinued        2.25 g 100 mL/hr over 30 Minutes Intravenous Every 8 hours 03/20/21 0945 03/22/21 1510   03/18/21 1814  vancomycin (VANCOCIN) 1-5 GM/200ML-% IVPB       Note to Pharmacy: [name]   : cabinet override      03/18/21 1814 03/18/21 1819   03/18/21 1115  vancomycin (VANCOCIN) IVPB 1000 mg/200 mL premix        1,000 mg 200 mL/hr over 60 Minutes Intravenous  Once 03/18/21 1112 03/18/21 1919   03/18/21 1115  piperacillin-tazobactam (ZOSYN) IVPB 3.375 g  Status:  Discontinued        3.375 g 12.5 mL/hr over 240 Minutes Intravenous Every 12 hours 03/18/21 1112 03/20/21 0945      Subjective: Today, patient was seen and examined at bedside.  Denies any nausea vomiting but has had nonspecific abdominal pain.  Has had brown stools.  Denies any fever chills or rigor.  Objective: Vitals:   03/23/21 2027 03/24/21 0614  BP: (!) 151/41 (!) 156/39  Pulse: (!) 59 (!) 59  Resp: 16 17  Temp: 97.8 F (36.6 C) 98 F (36.7 C)  SpO2: 100% 100%    Intake/Output Summary (Last 24 hours) at 03/24/2021 1318 Last data filed at 03/23/2021 1619 Gross per 24 hour  Intake --  Output 1 ml  Net -1 ml   Filed Weights   03/21/21 1130 03/23/21 0755 03/23/21 1125  Weight: 54 kg 56.4 kg 54 kg   Body mass index is 20.43 kg/m.   Physical Exam:  General:  Average built, not in obvious distress, chronically ill, alert awake and communicative HENT: Mild pallor noted. Oral mucosa is moist.  Chest:  Clear breath sounds.  Diminished breath sounds bilaterally. No crackles or wheezes.  CVS: S1 &S2 heard. No murmur.  Regular rate and rhythm. Abdomen: Soft, nondistended, nonspecific tenderness noted on palpation, reducible umbilical hernia bowel sounds are heard.    Extremities: No cyanosis, clubbing or edema.  Peripheral pulses are palpable.  Right femoral hemodialysis access. Psych: Alert, awake and oriented, normal mood CNS:  No cranial nerve deficits.  Power equal in all extremities.   Skin: Warm and dry.  No rashes noted.   Data Review:  I have personally reviewed the following laboratory data and studies   CBC: Recent Labs  Lab 03/18/21 0245 03/19/21 0102 03/19/21 0635 03/20/21 0059 03/21/21 0230 03/22/21 1631 03/23/21 0548 03/23/21 1351 03/23/21 1648 03/24/21 0602  WBC 20.3* 15.7*  --  19.2* 17.8* 9.7 8.7  --   --   --   NEUTROABS 18.2*  --   --   --   --   --   --   --   --   --   HGB 9.5* 7.0*   < >  8.1* 7.8* 8.0* 7.0* 7.4* 7.2* 6.6*  HCT 30.2* 21.9*   < > 25.7* 24.0* 25.2* 21.8* 23.4* 22.7* 21.2*  MCV 101.3* 100.9*  --  99.6 96.0 98.4 96.9  --   --   --   PLT 218 167  --  212 226 194 193  --   --   --    < > = values in this interval not displayed.   Basic Metabolic Panel: Recent Labs  Lab 03/18/21 0245 03/19/21 0102 03/20/21 0059 03/21/21 0230 03/23/21 0548  NA 134* 135 133* 131* 132*  K 3.5 3.6 3.8 3.8 3.5  CL 96* 99 97* 95* 98  CO2 27 27 24 25 26   GLUCOSE 118* 76 43* 97 109*  BUN 33* 17 28* 35* 24*  CREATININE 4.05* 2.48* 3.65* 4.36* 4.04*  CALCIUM 9.3 8.5* 8.8* 8.5* 8.1*  MG  --  2.0  --  2.2 2.0  PHOS  --   --   --  3.9 3.4   Liver Function Tests: Recent Labs  Lab 03/18/21 0245 03/19/21 0102 03/20/21 0059  AST 18 14* 14*  ALT 13 11 11   ALKPHOS 73 67 73  BILITOT 0.6 0.7 1.3*  PROT 6.0* 5.3* 6.1*  ALBUMIN 2.5* 2.0* 2.2*   No results for input(s): LIPASE, AMYLASE in the last 168 hours. No results for input(s): AMMONIA in the last 168 hours. Cardiac Enzymes: No results for input(s): CKTOTAL, CKMB, CKMBINDEX, TROPONINI in the last 168 hours. BNP (last 3 results) No results for input(s): BNP in the last 8760 hours.  ProBNP (last 3 results) No results for input(s): PROBNP in the last 8760  hours.  CBG: Recent Labs  Lab 03/20/21 0241 03/20/21 0317 03/20/21 1524 03/20/21 1959  GLUCAP 44* 136* 55* 155*   Recent Results (from the past 240 hour(s))  Resp Panel by RT-PCR (Flu A&B, Covid) Nasopharyngeal Swab     Status: None   Collection Time: 03/18/21  8:15 AM   Specimen: Nasopharyngeal Swab; Nasopharyngeal(NP) swabs in vial transport medium  Result Value Ref Range Status   SARS Coronavirus 2 by RT PCR NEGATIVE NEGATIVE Final    Comment: (NOTE) SARS-CoV-2 target nucleic acids are NOT DETECTED.  The SARS-CoV-2 RNA is generally detectable in upper respiratory specimens during the acute phase of infection. The lowest concentration of SARS-CoV-2 viral copies this assay can detect is 138 copies/mL. A negative result does not preclude SARS-Cov-2 infection and should not be used as the sole basis for treatment or other patient management decisions. A negative result may occur with  improper specimen collection/handling, submission of specimen other than nasopharyngeal swab, presence of viral mutation(s) within the areas targeted by this assay, and inadequate number of viral copies(<138 copies/mL). A negative result must be combined with clinical observations, patient history, and epidemiological information. The expected result is Negative.  Fact Sheet for Patients:  EntrepreneurPulse.com.au  Fact Sheet for Healthcare Providers:  IncredibleEmployment.be  This test is no t yet approved or cleared by the [place] FDA and  has been authorized for detection and/or diagnosis of SARS-CoV-2 by FDA under an Emergency Use Authorization (EUA). This EUA will remain  in effect (meaning this test can be used) for the duration of the COVID-19 declaration under Section 564(b)(1) of the Act, 21 U.S.C.section 360bbb-3(b)(1), unless the authorization is terminated  or revoked sooner.       Influenza A by PCR NEGATIVE NEGATIVE Final   Influenza  B by PCR NEGATIVE NEGATIVE Final  Comment: (NOTE) The Xpert Xpress SARS-CoV-2/FLU/RSV plus assay is intended as an aid in the diagnosis of influenza from Nasopharyngeal swab specimens and should not be used as a sole basis for treatment. Nasal washings and aspirates are unacceptable for Xpert Xpress SARS-CoV-2/FLU/RSV testing.  Fact Sheet for Patients: EntrepreneurPulse.com.au  Fact Sheet for Healthcare Providers: IncredibleEmployment.be  This test is not yet approved or cleared by the [place] FDA and has been authorized for detection and/or diagnosis of SARS-CoV-2 by FDA under an Emergency Use Authorization (EUA). This EUA will remain in effect (meaning this test can be used) for the duration of the COVID-19 declaration under Section 564(b)(1) of the Act, 21 U.S.C. section 360bbb-3(b)(1), unless the authorization is terminated or revoked.  Performed at [facility], [address], [place], [place] [address]      Studies: No results found.   Flora Lipps, MD  [facility] 03/24/2021  If 7PM-7AM, please contact night-coverage

## 2021-03-25 ENCOUNTER — Inpatient Hospital Stay (HOSPITAL_COMMUNITY): Payer: Medicare Other | Admitting: Certified Registered"

## 2021-03-25 ENCOUNTER — Encounter (HOSPITAL_COMMUNITY)
Admission: EM | Disposition: A | Discharge status: Skilled Nursing Facility | Payer: Self-pay | Source: Home / Self Care | Attending: Internal Medicine

## 2021-03-25 ENCOUNTER — Encounter (HOSPITAL_COMMUNITY): Payer: Self-pay | Admitting: Internal Medicine

## 2021-03-25 DIAGNOSIS — K651 Peritoneal abscess: Secondary | ICD-10-CM | POA: Diagnosis not present

## 2021-03-25 DIAGNOSIS — I10 Essential (primary) hypertension: Secondary | ICD-10-CM | POA: Diagnosis not present

## 2021-03-25 DIAGNOSIS — K449 Diaphragmatic hernia without obstruction or gangrene: Secondary | ICD-10-CM

## 2021-03-25 DIAGNOSIS — K921 Melena: Secondary | ICD-10-CM | POA: Diagnosis not present

## 2021-03-25 DIAGNOSIS — D62 Acute posthemorrhagic anemia: Secondary | ICD-10-CM | POA: Diagnosis not present

## 2021-03-25 DIAGNOSIS — N186 End stage renal disease: Secondary | ICD-10-CM | POA: Diagnosis not present

## 2021-03-25 DIAGNOSIS — I5022 Chronic systolic (congestive) heart failure: Secondary | ICD-10-CM | POA: Diagnosis not present

## 2021-03-25 HISTORY — PX: ESOPHAGOGASTRODUODENOSCOPY (EGD) WITH PROPOFOL: SHX5813

## 2021-03-25 LAB — POCT I-STAT, CHEM 8
BUN: 8 mg/dL (ref 8–23)
Calcium, Ion: 1.15 mmol/L (ref 1.15–1.40)
Chloride: 94 mmol/L — ABNORMAL LOW (ref 98–111)
Creatinine, Ser: 2.3 mg/dL — ABNORMAL HIGH (ref 0.44–1.00)
Glucose, Bld: 73 mg/dL (ref 70–99)
HCT: 35 % — ABNORMAL LOW (ref 36.0–46.0)
Hemoglobin: 11.9 g/dL — ABNORMAL LOW (ref 12.0–15.0)
Potassium: 3.7 mmol/L (ref 3.5–5.1)
Sodium: 135 mmol/L (ref 135–145)
TCO2: 31 mmol/L (ref 22–32)

## 2021-03-25 LAB — CBC
HCT: 26.5 % — ABNORMAL LOW (ref 36.0–46.0)
Hemoglobin: 8.6 g/dL — ABNORMAL LOW (ref 12.0–15.0)
MCH: 30.2 pg (ref 26.0–34.0)
MCHC: 32.5 g/dL (ref 30.0–36.0)
MCV: 93 fL (ref 80.0–100.0)
Platelets: 182 10*3/uL (ref 150–400)
RBC: 2.85 MIL/uL — ABNORMAL LOW (ref 3.87–5.11)
RDW: 17.2 % — ABNORMAL HIGH (ref 11.5–15.5)
WBC: 8.9 10*3/uL (ref 4.0–10.5)
nRBC: 0 % (ref 0.0–0.2)

## 2021-03-25 LAB — RENAL FUNCTION PANEL
Albumin: 1.9 g/dL — ABNORMAL LOW (ref 3.5–5.0)
Anion gap: 5 (ref 5–15)
BUN: 10 mg/dL (ref 8–23)
CO2: 28 mmol/L (ref 22–32)
Calcium: 8 mg/dL — ABNORMAL LOW (ref 8.9–10.3)
Chloride: 103 mmol/L (ref 98–111)
Creatinine, Ser: 2.47 mg/dL — ABNORMAL HIGH (ref 0.44–1.00)
GFR, Estimated: 20 mL/min — ABNORMAL LOW (ref >60)
Glucose, Bld: 88 mg/dL (ref 70–99)
Phosphorus: 1.7 mg/dL — ABNORMAL LOW (ref 2.5–4.6)
Potassium: 2.9 mmol/L — ABNORMAL LOW (ref 3.5–5.1)
Sodium: 136 mmol/L (ref 135–145)

## 2021-03-25 SURGERY — ESOPHAGOGASTRODUODENOSCOPY (EGD) WITH PROPOFOL
Anesthesia: Monitor Anesthesia Care

## 2021-03-25 MED ORDER — PROPOFOL 10 MG/ML IV BOLUS
INTRAVENOUS | Status: DC | PRN
  Administered 2021-03-25: 20 mg via INTRAVENOUS
  Administered 2021-03-25: 10 mg via INTRAVENOUS

## 2021-03-25 MED ORDER — LIDOCAINE 2% (20 MG/ML) 5 ML SYRINGE
INTRAMUSCULAR | Status: DC | PRN
  Administered 2021-03-25: 50 mg via INTRAVENOUS

## 2021-03-25 MED ORDER — PROPOFOL 500 MG/50ML IV EMUL
INTRAVENOUS | Status: DC | PRN
  Administered 2021-03-25: 100 ug/kg/min via INTRAVENOUS

## 2021-03-25 SURGICAL SUPPLY — 15 items

## 2021-03-25 NOTE — Interval H&P Note (Signed)
History and Physical Interval Note:  No acute events overnight.  Transfused 1 unit PRBCs yesterday with posttransfusion H/H 8.6/26.5 today.  I spoke with her son again today regarding today's procedure, and he again agrees with proceeding.  Patient also provides consent as below.  03/25/2021 1:27 PM  [name]  has presented today for surgery, with the diagnosis of Anemia.  The various methods of treatment have been discussed with the patient and family. After consideration of risks, benefits and other options for treatment, the patient has consented to  Procedure(s): ESOPHAGOGASTRODUODENOSCOPY (EGD) WITH PROPOFOL (N/A) as a surgical intervention.  The patient's history has been reviewed, patient examined, no change in status, stable for surgery.  I have reviewed the patient's chart and labs.  Questions were answered to the patient's satisfaction.     Dominic Pea [name]

## 2021-03-25 NOTE — Transfer of Care (Signed)
Immediate Anesthesia Transfer of Care Note  Patient: [name]  Procedure(s) Performed: ESOPHAGOGASTRODUODENOSCOPY (EGD) WITH PROPOFOL (N/A )  Patient Location: Endoscopy Unit  Anesthesia Type:MAC  Level of Consciousness: drowsy and patient cooperative  Airway & Oxygen Therapy: Patient Spontanous Breathing and Patient connected to nasal cannula oxygen  Post-op Assessment: Report given to RN and Post -op Vital signs reviewed and stable  Post vital signs: Reviewed and stable  Last Vitals:  Vitals Value Taken Time  BP 116/44 03/25/21 1453  Temp 36.5 C 03/25/21 1451  Pulse 58 03/25/21 1453  Resp 14 03/25/21 1453  SpO2 100 % 03/25/21 1453  Vitals shown include unvalidated device data.  Last Pain:  Vitals:   03/25/21 1451  TempSrc: Temporal  PainSc:       Patients Stated Pain Goal: 0 (17/79/39 0300)  Complications: No complications documented.

## 2021-03-25 NOTE — Anesthesia Preprocedure Evaluation (Addendum)
Anesthesia Evaluation  Patient identified by MRN, date of birth, ID band Patient awake    Reviewed: Allergy & Precautions, NPO status , Patient's Chart, lab work & pertinent test results  Airway Mallampati: II  TM Distance: >3 FB Neck ROM: Full    Dental  (+) Teeth Intact, Dental Advisory Given   Pulmonary neg pulmonary ROS,    breath sounds clear to auscultation       Cardiovascular hypertension, Pt. on home beta blockers + CAD  + Valvular Problems/Murmurs MR and AI  Rhythm:Regular Rate:Normal     Neuro/Psych negative neurological ROS  negative psych ROS   GI/Hepatic Neg liver ROS, GERD  Medicated,  Endo/Other  negative endocrine ROS  Renal/GU Renal disease     Musculoskeletal  (+) Arthritis ,   Abdominal Normal abdominal exam  (+)   Peds  Hematology negative hematology ROS (+)   Anesthesia Other Findings   Reproductive/Obstetrics                           Anesthesia Physical Anesthesia Plan  ASA: III  Anesthesia Plan: MAC   Post-op Pain Management:    Induction: Intravenous  PONV Risk Score and Plan: 0 and Propofol infusion  Airway Management Planned: Natural Airway and Nasal Cannula  Additional Equipment: None  Intra-op Plan:   Post-operative Plan:   Informed Consent: I have reviewed the patients History and Physical, chart, labs and discussed the procedure including the risks, benefits and alternatives for the proposed anesthesia with the patient or authorized representative who has indicated his/her understanding and acceptance.       Plan Discussed with: CRNA  Anesthesia Plan Comments:        Anesthesia Quick Evaluation

## 2021-03-25 NOTE — Anesthesia Postprocedure Evaluation (Signed)
Anesthesia Post Note  Patient: Ann Held  Procedure(s) Performed: ESOPHAGOGASTRODUODENOSCOPY (EGD) WITH PROPOFOL (N/A )     Patient location during evaluation: PACU Anesthesia Type: MAC Level of consciousness: awake and alert Pain management: pain level controlled Vital Signs Assessment: post-procedure vital signs reviewed and stable Respiratory status: spontaneous breathing, nonlabored ventilation, respiratory function stable and patient connected to nasal cannula oxygen Cardiovascular status: stable and blood pressure returned to baseline Postop Assessment: no apparent nausea or vomiting Anesthetic complications: no   No complications documented.  Last Vitals:  Vitals:   03/25/21 1510 03/25/21 1557  BP: (!) 158/63 (!) 152/54  Pulse: (!) 57 (!) 55  Resp: 14 14  Temp:  37 C  SpO2: 100% 100%    Last Pain:  Vitals:   03/25/21 1557  TempSrc: Oral  PainSc:                  Effie Berkshire

## 2021-03-25 NOTE — Op Note (Signed)
[facility] Patient Name: [name] Procedure Date : 03/25/2021 MRN: [id number] Attending MD: Gerrit Heck , MD Date of Birth: January 13, 1951 CSN: [id number] Age: 70 Admit Type: Inpatient Procedure:                Upper GI endoscopy Indications:              Acute post hemorrhagic anemia, Melena Providers:                Gerrit Heck, MD, Dulcy Fanny, Cletis Athens,                            Technician, Claybon Jabs CRNA, CRNA Referring MD:              Medicines:                Monitored Anesthesia Care Complications:            No immediate complications. Estimated Blood Loss:     Estimated blood loss: none. Procedure:                Pre-Anesthesia Assessment:                           - Prior to the procedure, a History and Physical                            was performed, and patient medications and                            allergies were reviewed. The patient's tolerance of                            previous anesthesia was also reviewed. The risks                            and benefits of the procedure and the sedation                            options and risks were discussed with the patient.                            All questions were answered, and informed consent                            was obtained. Prior Anticoagulants: The patient has                            taken no previous anticoagulant or antiplatelet                            agents. ASA Grade Assessment: III - A patient with                            severe systemic disease. After reviewing the risks  and benefits, the patient was deemed in                            satisfactory condition to undergo the procedure.                           After obtaining informed consent, the endoscope was                            passed under direct vision. Throughout the                            procedure, the patient's blood pressure, pulse, and                             oxygen saturations were monitored continuously. The                            [id number]-[id number] ([id number]) Olympus gastroscope was                            introduced through the mouth, and advanced to the                            second part of duodenum. The upper GI endoscopy was                            accomplished without difficulty. The patient                            tolerated the procedure well. Scope In: Scope Out: Findings:      A 3 cm hiatal hernia was present.      The upper third of the esophagus and middle third of the esophagus were       normal.      The entire examined stomach was normal.      The duodenal bulb, first portion of the duodenum and second portion of       the duodenum were normal. Impression:               - 3 cm hiatal hernia.                           - Normal upper third of esophagus and middle third                            of esophagus.                           - Normal stomach.                           - Normal duodenal bulb, first portion of the                            duodenum and second portion of the  duodenum.                           - No blood or stigmata of recent bleeding noted on                            this study.                           - No specimens collected. Recommendation:           - Return patient to hospital ward for ongoing care.                           - Advance diet as tolerated.                           - Continue present medications.                           - Repeat CT abdomen/pelvis today.                           - Continue serial Hbg/Hct. If declining Hgb, will                            plan for bowel prep and colonoscopy with Video                            Capsule Endoscopy if colonoscopy unrevealing.                           - I discussed these results with the patient and                            with her son by telephone. Procedure Code(s):        --- Professional ---                            [phone], Esophagogastroduodenoscopy, flexible,                            transoral; diagnostic, including collection of                            specimen(s) by brushing or washing, when performed                            (separate procedure) Diagnosis Code(s):        --- Professional ---                           K44.9, Diaphragmatic hernia without obstruction or                            gangrene  D62, Acute posthemorrhagic anemia                           K92.1, Melena (includes Hematochezia) CPT copyright 2019 [employer] Medical [employer]. All rights reserved. The codes documented in this report are preliminary and upon coder review may  be revised to meet current compliance requirements. Gerrit Heck, MD 03/25/2021 3:03:33 PM Number of Addenda: 0

## 2021-03-25 NOTE — Progress Notes (Addendum)
PROGRESS NOTE  BRITLEY GASHI KYH:[id number] DOB: [birth year] DOA: 03/17/2021 PCP: Glenda Chroman, MD   LOS: 7 days   Brief narrative:  Brittany Winchel [name] a 70 y.o.femalewith medical history significant forESRD on HD Tuesday Thursday Saturday, essential hypertension, dyslipidemia, chronic anemia and GERD presented to hospital from [facility] with worsening abdominal distention and pain with black tarry stools for 2 weeks with impaired appetite as well.  In the ED patient had elevated white count at 20.3 and creatinine at 44 ESRD.  He was given vancomycin and Zosyn initially.  CT scan of the abdomen pelvis showed some large collection of fluid and gas and debris's consistent with mesenteric abscess likely related to small bowel perforation.  Patient was then admitted to hospital for further evaluation and treatment.  Subsequently, IR was consulted for possible IR guided drain but there was no drainable collection noted.  Due to black tarry stools, GI consultation was made.  GI recommended conservative treatment due to duodenal diverticulum.  Patient did have a drop in hemoglobin and was transfused 1 unit of packed RBC.  GI was reconsulted and underwent EGD on 03/25/2021 with normal findings.  Assessment/Plan:  Principal Problem:   Mesenteric abscess (HCC) Active Problems:   Hyperlipidemia   Mitral valve regurgitation   Essential hypertension   Coronary artery disease   SYSTOLIC HEART FAILURE, CHRONIC   ESRD on dialysis ([place])   Perforation bowel (HCC)   Melena   Acute blood loss anemia   Generalized abdominal pain  Suspected mesenteric abscess with possible associated bowel perforation CT scan from 03/19/21 without enhancing collection but possible finding of fluid density consistent with patulous diverticulum.  General surgery on board.  Was on IV Zosyn and  now on augmentin.  On soft diet and has been tolerating.  Leukocytosis has normalized.   GI bleed with Anemia of chronic renal  disease  Patient had hemoglobin around 10.6, around 3 weeks prior to admission.  Hemoglobin  dropped to 6.8.  Received 1 unit of packed RBC on 03/24/2021.  Continue to hold aspirin from home.  Status post EGD on 03/25/2021 with normal findings.  GI recommends a repeat CT scan to assess for the diverticulum.  ESRD on HD Tuesday, Thursday Saturday - Nephology on board for hemodialysis.  Currently receiving hemodialysis.  Essential hypertension-continue Imdur, labetalol minoxidil.  Latest blood pressure of 160/45  Dyslipidemia -  Statins is on hold.  CAD -statins on hold.  Continue Imdur and labetalol. Aspirin on hold.  Debility, weakness.  Patient was seen by physical therapy and recommended skilled nursing facility placement.  Transition of care has been consulted.  DVT prophylaxis: SCDs Start: 03/18/21 1207  Code Status:  Full code  Family Communication:  None  Status is: Inpatient  Remains inpatient appropriate because:IV treatments appropriate due to intensity of illness or inability to take PO and Inpatient level of care appropriate due to severity of illness,  Dispo: The patient is from: Home              Anticipated d/c is to: Skilled nursing facility placement as per PT recommendation.              Patient currently is not medically stable to d/c.   Difficult to place patient No  Consultants:  General surgery  Intervention radiology  Nephrology  GI  Procedures:  Hemodialysis  PRBC transfusion  Anti-infectives:  Marland Kitchen Augmentin 5/4>  Anti-infectives (From admission, onward)   Start     Dose/Rate  Route Frequency Ordered Stop   03/22/21 2200  amoxicillin-clavulanate (AUGMENTIN) 500-125 MG per tablet 500 mg        1 tablet Oral Every 12 hours 03/22/21 1518     03/21/21 1200  vancomycin (VANCOCIN) IVPB 500 mg/100 ml premix  Status:  Discontinued        500 mg 100 mL/hr over 60 Minutes Intravenous Every T-Th-Sa (Hemodialysis) 03/18/21 1229 03/21/21 0651    03/21/21 1200  vancomycin (VANCOCIN) IVPB 500 mg/100 ml premix  Status:  Discontinued        500 mg 100 mL/hr over 60 Minutes Intravenous Every T-Th-Sa (Hemodialysis) 03/21/21 0651 03/21/21 0724   03/20/21 1045  piperacillin-tazobactam (ZOSYN) 2.25 g in sodium chloride 0.9 % 50 mL IVPB  Status:  Discontinued        2.25 g 100 mL/hr over 30 Minutes Intravenous Every 8 hours 03/20/21 0945 03/22/21 1510   03/18/21 1814  vancomycin (VANCOCIN) 1-5 GM/200ML-% IVPB       Note to Pharmacy: [name]   : cabinet override      03/18/21 1814 03/18/21 1819   03/18/21 1115  vancomycin (VANCOCIN) IVPB 1000 mg/200 mL premix        1,000 mg 200 mL/hr over 60 Minutes Intravenous  Once 03/18/21 1112 03/18/21 1919   03/18/21 1115  piperacillin-tazobactam (ZOSYN) IVPB 3.375 g  Status:  Discontinued        3.375 g 12.5 mL/hr over 240 Minutes Intravenous Every 12 hours 03/18/21 1112 03/20/21 0945      Subjective: today, patient was seen and examined at bedside.  Patient denies any nausea vomiting fever chills.  Has nonspecific abdominal pain.  Has had a bowel movement.  Awaiting for endoscopy today.  Objective: Vitals:   03/25/21 0815 03/25/21 0830  BP: (!) 148/54 (!) 151/50  Pulse: (!) 59 (!) 59  Resp:    Temp:    SpO2:      Intake/Output Summary (Last 24 hours) at 03/25/2021 0850 Last data filed at 03/24/2021 1900 Gross per 24 hour  Intake 650 ml  Output --  Net 650 ml   Filed Weights   03/23/21 0755 03/23/21 1125 03/25/21 0809  Weight: 56.4 kg 54 kg 55.8 kg   Body mass index is 21.12 kg/m.   Physical Exam: General:  Average built, not in obvious distress, chronically ill, alert awake and communicative HENT:   Mild pallor noted. Chest:  Clear breath sounds.  Diminished breath sounds bilaterally. No crackles or wheezes.  CVS: S1 &S2 heard. No murmur.  Regular rate and rhythm. Abdomen: Soft, nontender, nondistended.  Bowel sounds are heard.  Nonspecific tenderness noted, reducible  umbilical hernia Extremities: No cyanosis, clubbing or edema.  Peripheral pulses are palpable. Psych: Alert, awake and oriented, normal mood CNS:  No cranial nerve deficits.  Power equal in all extremities.   Skin: Warm and dry.  No rashes noted.   Data Review:  I have personally reviewed the following laboratory data and studies   CBC: Recent Labs  Lab 03/21/21 0230 03/22/21 1631 03/23/21 0548 03/23/21 1351 03/23/21 1648 03/24/21 0602 03/24/21 2230 03/25/21 0146  WBC 17.8* 9.7 8.7  --   --   --  9.5 8.9  NEUTROABS  --   --   --   --   --   --  7.3  --   HGB 7.8* 8.0* 7.0* 7.4* 7.2* 6.6* 9.3* 8.6*  HCT 24.0* 25.2* 21.8* 23.4* 22.7* 21.2* 28.7* 26.5*  MCV 96.0 98.4 96.9  --   --   --  94.1 93.0  PLT 226 194 193  --   --   --  188 426   Basic Metabolic Panel: Recent Labs  Lab 03/19/21 0102 03/20/21 0059 03/21/21 0230 03/23/21 0548  NA 135 133* 131* 132*  K 3.6 3.8 3.8 3.5  CL 99 97* 95* 98  CO2 27 24 25 26   GLUCOSE 76 43* 97 109*  BUN 17 28* 35* 24*  CREATININE 2.48* 3.65* 4.36* 4.04*  CALCIUM 8.5* 8.8* 8.5* 8.1*  MG 2.0  --  2.2 2.0  PHOS  --   --  3.9 3.4   Liver Function Tests: Recent Labs  Lab 03/19/21 0102 03/20/21 0059  AST 14* 14*  ALT 11 11  ALKPHOS 67 73  BILITOT 0.7 1.3*  PROT 5.3* 6.1*  ALBUMIN 2.0* 2.2*   No results for input(s): LIPASE, AMYLASE in the last 168 hours. No results for input(s): AMMONIA in the last 168 hours. Cardiac Enzymes: No results for input(s): CKTOTAL, CKMB, CKMBINDEX, TROPONINI in the last 168 hours. BNP (last 3 results) No results for input(s): BNP in the last 8760 hours.  ProBNP (last 3 results) No results for input(s): PROBNP in the last 8760 hours.  CBG: Recent Labs  Lab 03/20/21 0241 03/20/21 0317 03/20/21 1524 03/20/21 1959  GLUCAP 44* 136* 55* 155*   Recent Results (from the past 240 hour(s))  Resp Panel by RT-PCR (Flu A&B, Covid) Nasopharyngeal Swab     Status: None   Collection Time: 03/18/21  8:15  AM   Specimen: Nasopharyngeal Swab; Nasopharyngeal(NP) swabs in vial transport medium  Result Value Ref Range Status   SARS Coronavirus 2 by RT PCR NEGATIVE NEGATIVE Final    Comment: (NOTE) SARS-CoV-2 target nucleic acids are NOT DETECTED.  The SARS-CoV-2 RNA is generally detectable in upper respiratory specimens during the acute phase of infection. The lowest concentration of SARS-CoV-2 viral copies this assay can detect is 138 copies/mL. A negative result does not preclude SARS-Cov-2 infection and should not be used as the sole basis for treatment or other patient management decisions. A negative result may occur with  improper specimen collection/handling, submission of specimen other than nasopharyngeal swab, presence of viral mutation(s) within the areas targeted by this assay, and inadequate number of viral copies(<138 copies/mL). A negative result must be combined with clinical observations, patient history, and epidemiological information. The expected result is Negative.  Fact Sheet for Patients:  EntrepreneurPulse.com.au  Fact Sheet for Healthcare Providers:  IncredibleEmployment.be  This test is no t yet approved or cleared by the [place] FDA and  has been authorized for detection and/or diagnosis of SARS-CoV-2 by FDA under an Emergency Use Authorization (EUA). This EUA will remain  in effect (meaning this test can be used) for the duration of the COVID-19 declaration under Section 564(b)(1) of the Act, 21 U.S.C.section 360bbb-3(b)(1), unless the authorization is terminated  or revoked sooner.       Influenza A by PCR NEGATIVE NEGATIVE Final   Influenza B by PCR NEGATIVE NEGATIVE Final    Comment: (NOTE) The Xpert Xpress SARS-CoV-2/FLU/RSV plus assay is intended as an aid in the diagnosis of influenza from Nasopharyngeal swab specimens and should not be used as a sole basis for treatment. Nasal washings and aspirates are  unacceptable for Xpert Xpress SARS-CoV-2/FLU/RSV testing.  Fact Sheet for Patients: EntrepreneurPulse.com.au  Fact Sheet for Healthcare Providers: IncredibleEmployment.be  This test is not yet approved or cleared by the [place] FDA and has been authorized for detection and/or diagnosis  of SARS-CoV-2 by FDA under an Emergency Use Authorization (EUA). This EUA will remain in effect (meaning this test can be used) for the duration of the COVID-19 declaration under Section 564(b)(1) of the Act, 21 U.S.C. section 360bbb-3(b)(1), unless the authorization is terminated or revoked.  Performed at [facility], [address] [place], [place] [address]      Studies: No results found.   Flora Lipps, MD  [facility] 03/25/2021  If 7PM-7AM, please contact night-coverage

## 2021-03-25 NOTE — Procedures (Signed)
I was present at this dialysis session. I have reviewed the session itself and made appropriate changes.   Hb 8.6. RLE AVG.  3K, 2L UF.    Filed Weights   03/23/21 0755 03/23/21 1125 03/25/21 0809  Weight: 56.4 kg 54 kg 55.8 kg    Recent Labs  Lab 03/23/21 0548  NA 132*  K 3.5  CL 98  CO2 26  GLUCOSE 109*  BUN 24*  CREATININE 4.04*  CALCIUM 8.1*  PHOS 3.4    Recent Labs  Lab 03/23/21 0548 03/23/21 1351 03/24/21 0602 03/24/21 2230 03/25/21 0146  WBC 8.7  --   --  9.5 8.9  NEUTROABS  --   --   --  7.3  --   HGB 7.0*   < > 6.6* 9.3* 8.6*  HCT 21.8*   < > 21.2* 28.7* 26.5*  MCV 96.9  --   --  94.1 93.0  PLT 193  --   --  188 182   < > = values in this interval not displayed.    Scheduled Meds: . amoxicillin-clavulanate  1 tablet Oral Q12H  . Chlorhexidine Gluconate Cloth  6 each Topical Q0600  . cinacalcet  30 mg Oral QPC supper  . cycloSPORINE  1 drop Both Eyes BID  . docusate sodium  100 mg Oral Daily  . isosorbide mononitrate  30 mg Oral Daily  . labetalol  100 mg Oral BID  . latanoprost  1 drop Both Eyes QHS  . minoxidil  2.5 mg Oral BID  . pantoprazole  40 mg Oral BID AC  . sodium chloride flush  3 mL Intravenous Q12H   Continuous Infusions: . sodium chloride    . sodium chloride    . sodium chloride     PRN Meds:.sodium chloride, acetaminophen **OR** acetaminophen, hydrALAZINE, HYDROmorphone (DILAUDID) injection, ondansetron **OR** ondansetron (ZOFRAN) IV, sodium chloride flush, traMADol   Pearson Grippe  MD 03/25/2021, 9:55 AM

## 2021-03-26 ENCOUNTER — Inpatient Hospital Stay (HOSPITAL_COMMUNITY): Payer: Medicare Other

## 2021-03-26 DIAGNOSIS — I340 Nonrheumatic mitral (valve) insufficiency: Secondary | ICD-10-CM

## 2021-03-26 DIAGNOSIS — D62 Acute posthemorrhagic anemia: Secondary | ICD-10-CM | POA: Diagnosis not present

## 2021-03-26 DIAGNOSIS — N186 End stage renal disease: Secondary | ICD-10-CM | POA: Diagnosis not present

## 2021-03-26 DIAGNOSIS — K449 Diaphragmatic hernia without obstruction or gangrene: Secondary | ICD-10-CM

## 2021-03-26 DIAGNOSIS — I5022 Chronic systolic (congestive) heart failure: Secondary | ICD-10-CM | POA: Diagnosis not present

## 2021-03-26 DIAGNOSIS — K651 Peritoneal abscess: Secondary | ICD-10-CM | POA: Diagnosis not present

## 2021-03-26 DIAGNOSIS — I10 Essential (primary) hypertension: Secondary | ICD-10-CM | POA: Diagnosis not present

## 2021-03-26 LAB — CBC
HCT: 28 % — ABNORMAL LOW (ref 36.0–46.0)
Hemoglobin: 9.1 g/dL — ABNORMAL LOW (ref 12.0–15.0)
MCH: 30.1 pg (ref 26.0–34.0)
MCHC: 32.5 g/dL (ref 30.0–36.0)
MCV: 92.7 fL (ref 80.0–100.0)
Platelets: 181 10*3/uL (ref 150–400)
RBC: 3.02 MIL/uL — ABNORMAL LOW (ref 3.87–5.11)
RDW: 16.8 % — ABNORMAL HIGH (ref 11.5–15.5)
WBC: 9 10*3/uL (ref 4.0–10.5)
nRBC: 0 % (ref 0.0–0.2)

## 2021-03-26 MED ORDER — IOHEXOL 350 MG/ML SOLN
75.0000 mL | Freq: Once | INTRAVENOUS | Status: AC | PRN
  Administered 2021-03-26: 75 mL via INTRAVENOUS

## 2021-03-26 MED ORDER — IOHEXOL 9 MG/ML PO SOLN
ORAL | Status: AC
  Administered 2021-03-26: 500 mL
  Filled 2021-03-26: qty 1000

## 2021-03-26 NOTE — Progress Notes (Signed)
PROGRESS NOTE  [name] OZH:[id number] DOB: 04/21/1951 DOA: 03/17/2021 PCP: Glenda Chroman, MD   LOS: 8 days   Brief narrative:  Brittany Secrist [name] a 70 y.o.femalewith medical history significant forESRD on HD Tuesday Thursday Saturday, essential hypertension, dyslipidemia, chronic anemia and GERD presented to hospital from [facility] with worsening abdominal distention and pain with black tarry stools for 2 weeks with impaired appetite as well.  In the ED patient had elevated white count at 20.3 and creatinine at 44 ESRD.  He was given vancomycin and Zosyn initially.  CT scan of the abdomen pelvis showed some large collection of fluid and gas and debris's consistent with mesenteric abscess likely related to small bowel perforation.  Patient was then admitted to hospital for further evaluation and treatment.  Subsequently, IR was consulted for possible IR guided drain but there was no drainable collection noted.  Due to black tarry stools, GI consultation was made.  GI recommended conservative treatment due to duodenal diverticulum.  Patient did have a drop in hemoglobin and was transfused 1 unit of packed RBC.  GI was reconsulted and underwent EGD on 03/25/2021 with normal findings.  Assessment/Plan:  Principal Problem:   Mesenteric abscess (HCC) Active Problems:   Hyperlipidemia   Mitral valve regurgitation   Essential hypertension   Coronary artery disease   SYSTOLIC HEART FAILURE, CHRONIC   ESRD on dialysis ([place])   Perforation bowel (HCC)   Melena   Acute blood loss anemia   Generalized abdominal pain   Hiatal hernia  Suspected mesenteric abscess with possible associated bowel perforation CT scan from 03/19/21 without enhancing collection but possible finding of fluid density consistent with patulous diverticulum.  General surgery on board.  Was on IV Zosyn and  now on augmentin.  On soft diet and has been tolerating.  Leukocytosis has normalized.   GI bleed with Anemia of  chronic renal disease  Patient had hemoglobin around 10.6, around 3 weeks prior to admission.  Hemoglobin  dropped to 6.8.  Received 1 unit of packed RBC on 03/24/2021.  Continue to hold aspirin from home.  Status post EGD on 03/25/2021 with normal findings.  GI recommends a repeat CT scan to assess for the diverticulum.  ESRD on HD Tuesday, Thursday Saturday - Nephology on board for hemodialysis.  Currently receiving hemodialysis.  Essential hypertension-continue Imdur, labetalol minoxidil.  Latest blood pressure of 160/45  Dyslipidemia -  Statins is on hold.  CAD -statins on hold.  Continue Imdur and labetalol. Aspirin on hold.  Debility, weakness.  Patient was seen by physical therapy and recommended skilled nursing facility placement.  Transition of care has been consulted.  DVT prophylaxis: SCDs Start: 03/18/21 1207  Code Status:  Full code  Family Communication:  None today.  Status is: Inpatient  Remains inpatient appropriate because:IV treatments appropriate due to intensity of illness or inability to take PO and Inpatient level of care appropriate due to severity of illness,  Dispo: The patient is from: Home              Anticipated d/c is to: Skilled nursing facility placement as per PT recommendation.              Patient currently is not medically stable to d/c.   Difficult to place patient No  Consultants:  General surgery  Intervention radiology  Nephrology  GI  Procedures:  Hemodialysis  PRBC transfusion  Anti-infectives:  Marland Kitchen Augmentin 5/4>  Anti-infectives (From admission, onward)   Start  Dose/Rate Route Frequency Ordered Stop   03/22/21 2200  amoxicillin-clavulanate (AUGMENTIN) 500-125 MG per tablet 500 mg        1 tablet Oral Every 12 hours 03/22/21 1518     03/21/21 1200  vancomycin (VANCOCIN) IVPB 500 mg/100 ml premix  Status:  Discontinued        500 mg 100 mL/hr over 60 Minutes Intravenous Every T-Th-Sa (Hemodialysis) 03/18/21 1229  03/21/21 0651   03/21/21 1200  vancomycin (VANCOCIN) IVPB 500 mg/100 ml premix  Status:  Discontinued        500 mg 100 mL/hr over 60 Minutes Intravenous Every T-Th-Sa (Hemodialysis) 03/21/21 0651 03/21/21 0724   03/20/21 1045  piperacillin-tazobactam (ZOSYN) 2.25 g in sodium chloride 0.9 % 50 mL IVPB  Status:  Discontinued        2.25 g 100 mL/hr over 30 Minutes Intravenous Every 8 hours 03/20/21 0945 03/22/21 1510   03/18/21 1814  vancomycin (VANCOCIN) 1-5 GM/200ML-% IVPB       Note to Pharmacy: [name]   : cabinet override      03/18/21 1814 03/18/21 1819   03/18/21 1115  vancomycin (VANCOCIN) IVPB 1000 mg/200 mL premix        1,000 mg 200 mL/hr over 60 Minutes Intravenous  Once 03/18/21 1112 03/18/21 1919   03/18/21 1115  piperacillin-tazobactam (ZOSYN) IVPB 3.375 g  Status:  Discontinued        3.375 g 12.5 mL/hr over 240 Minutes Intravenous Every 12 hours 03/18/21 1112 03/20/21 0945      Subjective: Today, patient was seen and examined at bedside.  Patient denies any nausea vomiting fever or chills.  Has mild nonspecific abdominal pain.  Has had a bowel movement this morning   Objective: Vitals:   03/26/21 0509 03/26/21 0531  BP: (!) 147/75 (!) 160/45  Pulse: (!) 108 (!) 56  Resp: 18 16  Temp: 98.3 F (36.8 C) (!) 97.5 F (36.4 C)  SpO2: 94% 100%    Intake/Output Summary (Last 24 hours) at 03/26/2021 0945 Last data filed at 03/25/2021 2040 Gross per 24 hour  Intake 120 ml  Output 2000 ml  Net -1880 ml   Filed Weights   03/25/21 0809 03/25/21 1157 03/25/21 1351  Weight: 55.8 kg 53.6 kg 54.4 kg   Body mass index is 20.6 kg/m.   Physical Exam: General:  Average built, not in obvious distress, chronically ill, alert awake and communicative HENT:   Mild pallor noted. Chest:  Clear breath sounds.  Diminished breath sounds bilaterally. No crackles or wheezes.  CVS: S1 &S2 heard. No murmur.  Regular rate and rhythm. Abdomen: Soft, nontender, nondistended.   Bowel sounds are heard.  Nonspecific tenderness noted, reducible umbilical hernia Extremities: No cyanosis, clubbing or edema.  Peripheral pulses are palpable. Psych: Alert, awake and communicative, normal mood CNS:  No cranial nerve deficits.  Power equal in all extremities.   Skin: Warm and dry.  No rashes noted.   Data Review:  I have personally reviewed the following laboratory data and studies   CBC: Recent Labs  Lab 03/22/21 1631 03/23/21 0548 03/23/21 1351 03/24/21 0602 03/24/21 2230 03/25/21 0146 03/25/21 1415 03/26/21 0050  WBC 9.7 8.7  --   --  9.5 8.9  --  9.0  NEUTROABS  --   --   --   --  7.3  --   --   --   HGB 8.0* 7.0*   < > 6.6* 9.3* 8.6* 11.9* 9.1*  HCT 25.2* 21.8*   < >  21.2* 28.7* 26.5* 35.0* 28.0*  MCV 98.4 96.9  --   --  94.1 93.0  --  92.7  PLT 194 193  --   --  188 182  --  181   < > = values in this interval not displayed.   Basic Metabolic Panel: Recent Labs  Lab 03/20/21 0059 03/21/21 0230 03/23/21 0548 03/25/21 1038 03/25/21 1415  NA 133* 131* 132* 136 135  K 3.8 3.8 3.5 2.9* 3.7  CL 97* 95* 98 103 94*  CO2 24 25 26 28   --   GLUCOSE 43* 97 109* 88 73  BUN 28* 35* 24* 10 8  CREATININE 3.65* 4.36* 4.04* 2.47* 2.30*  CALCIUM 8.8* 8.5* 8.1* 8.0*  --   MG  --  2.2 2.0  --   --   PHOS  --  3.9 3.4 1.7*  --    Liver Function Tests: Recent Labs  Lab 03/20/21 0059 03/25/21 1038  AST 14*  --   ALT 11  --   ALKPHOS 73  --   BILITOT 1.3*  --   PROT 6.1*  --   ALBUMIN 2.2* 1.9*   No results for input(s): LIPASE, AMYLASE in the last 168 hours. No results for input(s): AMMONIA in the last 168 hours. Cardiac Enzymes: No results for input(s): CKTOTAL, CKMB, CKMBINDEX, TROPONINI in the last 168 hours. BNP (last 3 results) No results for input(s): BNP in the last 8760 hours.  ProBNP (last 3 results) No results for input(s): PROBNP in the last 8760 hours.  CBG: Recent Labs  Lab 03/20/21 0241 03/20/21 0317 03/20/21 1524 03/20/21 1959   GLUCAP 44* 136* 55* 155*   Recent Results (from the past 240 hour(s))  Resp Panel by RT-PCR (Flu A&B, Covid) Nasopharyngeal Swab     Status: None   Collection Time: 03/18/21  8:15 AM   Specimen: Nasopharyngeal Swab; Nasopharyngeal(NP) swabs in vial transport medium  Result Value Ref Range Status   SARS Coronavirus 2 by RT PCR NEGATIVE NEGATIVE Final    Comment: (NOTE) SARS-CoV-2 target nucleic acids are NOT DETECTED.  The SARS-CoV-2 RNA is generally detectable in upper respiratory specimens during the acute phase of infection. The lowest concentration of SARS-CoV-2 viral copies this assay can detect is 138 copies/mL. A negative result does not preclude SARS-Cov-2 infection and should not be used as the sole basis for treatment or other patient management decisions. A negative result may occur with  improper specimen collection/handling, submission of specimen other than nasopharyngeal swab, presence of viral mutation(s) within the areas targeted by this assay, and inadequate number of viral copies(<138 copies/mL). A negative result must be combined with clinical observations, patient history, and epidemiological information. The expected result is Negative.  Fact Sheet for Patients:  EntrepreneurPulse.com.au  Fact Sheet for Healthcare Providers:  IncredibleEmployment.be  This test is no t yet approved or cleared by the [place] FDA and  has been authorized for detection and/or diagnosis of SARS-CoV-2 by FDA under an Emergency Use Authorization (EUA). This EUA will remain  in effect (meaning this test can be used) for the duration of the COVID-19 declaration under Section 564(b)(1) of the Act, 21 U.S.C.section 360bbb-3(b)(1), unless the authorization is terminated  or revoked sooner.       Influenza A by PCR NEGATIVE NEGATIVE Final   Influenza B by PCR NEGATIVE NEGATIVE Final    Comment: (NOTE) The Xpert Xpress SARS-CoV-2/FLU/RSV  plus assay is intended as an aid in the diagnosis of influenza from  Nasopharyngeal swab specimens and should not be used as a sole basis for treatment. Nasal washings and aspirates are unacceptable for Xpert Xpress SARS-CoV-2/FLU/RSV testing.  Fact Sheet for Patients: EntrepreneurPulse.com.au  Fact Sheet for Healthcare Providers: IncredibleEmployment.be  This test is not yet approved or cleared by the [place] FDA and has been authorized for detection and/or diagnosis of SARS-CoV-2 by FDA under an Emergency Use Authorization (EUA). This EUA will remain in effect (meaning this test can be used) for the duration of the COVID-19 declaration under Section 564(b)(1) of the Act, 21 U.S.C. section 360bbb-3(b)(1), unless the authorization is terminated or revoked.  Performed at [facility], [address] [place], [place] [address]      Studies: No results found.   Flora Lipps, MD  [facility] 03/26/2021  If 7PM-7AM, please contact night-coverage

## 2021-03-26 NOTE — Progress Notes (Signed)
[name] NEPHROLOG[facility] PROGRESS NOTE  Assessment/ Plan: 55F  with history of hypertension, HLD, anemia, acid reflux, ESRD on HD for last 23 years, TTS schedule at [facility], has right thigh AVG, transferred from [facility] for mesenteric abscess and possible bowel perforation, seen as a consultation for the management of ESRD.  #Mesenteric abscess vs  More likey duodenal diverticulosis; per GI and CCS.    # ESRD TTS at [facility]: On schedule. Volume status and electrolytes acceptable.  She has right thigh graft for the access.  No heparin.  Need outpatient treatment record.  Next HD on 5/10   # Hypertension: Stable BPs, CTM. Minoxidil, labetalol   # Anemia of ESRD: Hb stable down to 6.6, today; transfusion per prmary.  GI eval in process. Neg EGD 5/7. Give ESA with HD tomorrow.  # Metabolic Bone Disease: P at goal. Ca ok.  On cinacalcet  Subjective:  No c/o HD yesterday uneventful 2LUF EGD yesterday without findings to explain anemia For SNF  Objective Vital signs in last 24 hours: Vitals:   03/26/21 0317 03/26/21 0509 03/26/21 0531 03/26/21 1000  BP: (!) 159/53 (!) 147/75 (!) 160/45 (!) 166/60  Pulse: (!) 57 (!) 108 (!) 56 60  Resp: 16 18 16 16   Temp: 97.7 F (36.5 C) 98.3 F (36.8 C) (!) 97.5 F (36.4 C) (!) 97.4 F (36.3 C)  TempSrc: Oral Oral Oral Oral  SpO2: 100% 94% 100%   Weight:      Height:       Weight change:   Intake/Output Summary (Last 24 hours) at 03/26/2021 1203 Last data filed at 03/25/2021 2040 Gross per 24 hour  Intake 120 ml  Output --  Net 120 ml       Labs: Basic Metabolic Panel: Recent Labs  Lab 03/21/21 0230 03/23/21 0548 03/25/21 1038 03/25/21 1415  NA 131* 132* 136 135  K 3.8 3.5 2.9* 3.7  CL 95* 98 103 94*  CO2 25 26 28   --   GLUCOSE 97 109* 88 73  BUN 35* 24* 10 8  CREATININE 4.36* 4.04* 2.47* 2.30*  CALCIUM 8.5* 8.1* 8.0*  --   PHOS 3.9 3.4 1.7*  --    Liver Function Tests: Recent Labs   Lab 03/20/21 0059 03/25/21 1038  AST 14*  --   ALT 11  --   ALKPHOS 73  --   BILITOT 1.3*  --   PROT 6.1*  --   ALBUMIN 2.2* 1.9*   No results for input(s): LIPASE, AMYLASE in the last 168 hours. No results for input(s): AMMONIA in the last 168 hours. CBC: Recent Labs  Lab 03/22/21 1631 03/23/21 0548 03/23/21 1351 03/24/21 2230 03/25/21 0146 03/25/21 1415 03/26/21 0050  WBC 9.7 8.7  --  9.5 8.9  --  9.0  NEUTROABS  --   --   --  7.3  --   --   --   HGB 8.0* 7.0*   < > 9.3* 8.6* 11.9* 9.1*  HCT 25.2* 21.8*   < > 28.7* 26.5* 35.0* 28.0*  MCV 98.4 96.9  --  94.1 93.0  --  92.7  PLT 194 193  --  188 182  --  181   < > = values in this interval not displayed.   Cardiac Enzymes: No results for input(s): CKTOTAL, CKMB, CKMBINDEX, TROPONINI in the last 168 hours. CBG: Recent Labs  Lab 03/20/21 0241 03/20/21 0317 03/20/21 1524 03/20/21 1959  GLUCAP 44* 136* 55*  155*    Iron Studies: No results for input(s): IRON, TIBC, TRANSFERRIN, FERRITIN in the last 72 hours. Studies/Results: No results found.  Medications: Infusions: . sodium chloride    . sodium chloride      Scheduled Medications: . amoxicillin-clavulanate  1 tablet Oral Q12H  . Chlorhexidine Gluconate Cloth  6 each Topical Q0600  . cinacalcet  30 mg Oral QPC supper  . cycloSPORINE  1 drop Both Eyes BID  . docusate sodium  100 mg Oral Daily  . iohexol      . isosorbide mononitrate  30 mg Oral Daily  . labetalol  100 mg Oral BID  . latanoprost  1 drop Both Eyes QHS  . minoxidil  2.5 mg Oral BID  . pantoprazole  40 mg Oral BID AC  . sodium chloride flush  3 mL Intravenous Q12H    have reviewed scheduled and prn medications.  Physical Exam: General:NAD, comfortable Heart:RRR, s1s2 nl Lungs:clear b/l, no crackle Abdomen:soft,  non-distended Extremities:No edema Dialysis Access: Right thigh AV graft has aneurysmal dilatation with good thrill and bruit.  Rexene Agent 03/26/2021,12:03 PM  LOS: 8  days

## 2021-03-26 NOTE — Progress Notes (Signed)
[facility] ROUNDING NOTE   Subjective: No acute events overnight.  EGD completed yesterday and aside from 3 cm hiatal hernia, was largely unrevealing.  Due to continued abdominal pain, CT was repeated today with read pending.  She states no melena or hematochezia, just formed brown stool.  She reports her pain feels somewhat better than yesterday.  Tolerating p.o. without issue.  H/H stable at 9.1/28 (Hgb 11.9 yesterday was spurious, with preceding values being 8.6 and 9.3 respectively).   Objective: Vital signs in last 24 hours: Temp:  [97.4 F (36.3 C)-98.6 F (37 C)] 98.6 F (37 C) (05/08 1333) Pulse Rate:  [54-108] 54 (05/08 1333) Resp:  [13-18] 18 (05/08 1333) BP: (116-166)/(43-89) 151/59 (05/08 1333) SpO2:  [94 %-100 %] 100 % (05/08 1333) Last BM Date: 03/24/21 General: NAD Lungs:  CTA b/l, no w/r/r Heart:  RRR, no m/r/g Abdomen: TTP in upper abdomen, soft, , ND, +BS Ext:  No c/c/e    Intake/Output from previous day: 05/07 0701 - 05/08 0700 In: 120 [P.O.:120] Out: 2000  Intake/Output this shift: Total I/O In: 300 [P.O.:300] Out: -    Lab Results: Recent Labs    03/24/21 2230 03/25/21 0146 03/25/21 1415 03/26/21 0050  WBC 9.5 8.9  --  9.0  HGB 9.3* 8.6* 11.9* 9.1*  PLT 188 182  --  181  MCV 94.1 93.0  --  92.7   BMET Recent Labs    03/25/21 1038 03/25/21 1415  NA 136 135  K 2.9* 3.7  CL 103 94*  CO2 28  --   GLUCOSE 88 73  BUN 10 8  CREATININE 2.47* 2.30*  CALCIUM 8.0*  --    LFT Recent Labs    03/25/21 1038  ALBUMIN 1.9*   PT/INR No results for input(s): INR in the last 72 hours.    Imaging/Other results: No results found.    Assessment and Plan:  1) Abdominal pain 2) Melena? 3) Acute on chronic anemia 4) ESRD with HD on TTS  Admitted to [facility] with abdominal pain and reported melenic stool x2 weeks.  Initial imaging was concerning for mesenteric abscess and transferred to [facility].  IR  consulted and no drainable collection noted on repeat imaging, but instead suspicion for duodenal diverticulum.  Surgical service consulted and no indication for surgical intervention.  Was treated with Zosyn and transition to Augmentin.  Did have some reported dark stools earlier on this admission and slow downtrend in serum H/H, requiring 1 unit PRBC transfusion on 5/6.  Underwent EGD on 5/7 which was essentially normal.  Has not had dark stool in multiple days now and H/H otherwise stable.  - Continue daily H/H checks and monitor for overt bleeding - Follow-up repeat CT is completed earlier today     Lavena Bullion, DO  03/26/2021, 1:55 PM [facility] Pager [phone]

## 2021-03-27 ENCOUNTER — Encounter (HOSPITAL_COMMUNITY): Payer: Self-pay | Admitting: Gastroenterology

## 2021-03-27 DIAGNOSIS — R933 Abnormal findings on diagnostic imaging of other parts of digestive tract: Secondary | ICD-10-CM

## 2021-03-27 DIAGNOSIS — I10 Essential (primary) hypertension: Secondary | ICD-10-CM | POA: Diagnosis not present

## 2021-03-27 DIAGNOSIS — I5022 Chronic systolic (congestive) heart failure: Secondary | ICD-10-CM | POA: Diagnosis not present

## 2021-03-27 DIAGNOSIS — N186 End stage renal disease: Secondary | ICD-10-CM | POA: Diagnosis not present

## 2021-03-27 DIAGNOSIS — K651 Peritoneal abscess: Secondary | ICD-10-CM | POA: Diagnosis not present

## 2021-03-27 LAB — CBC
HCT: 26.8 % — ABNORMAL LOW (ref 36.0–46.0)
Hemoglobin: 8.7 g/dL — ABNORMAL LOW (ref 12.0–15.0)
MCH: 30.5 pg (ref 26.0–34.0)
MCHC: 32.5 g/dL (ref 30.0–36.0)
MCV: 94 fL (ref 80.0–100.0)
Platelets: 153 10*3/uL (ref 150–400)
RBC: 2.85 MIL/uL — ABNORMAL LOW (ref 3.87–5.11)
RDW: 15.7 % — ABNORMAL HIGH (ref 11.5–15.5)
WBC: 7 10*3/uL (ref 4.0–10.5)
nRBC: 0 % (ref 0.0–0.2)

## 2021-03-27 LAB — RESP PANEL BY RT-PCR (FLU A&B, COVID) ARPGX2
Influenza A by PCR: NEGATIVE
Influenza B by PCR: NEGATIVE
SARS Coronavirus 2 by RT PCR: NEGATIVE

## 2021-03-27 MED ORDER — CHLORHEXIDINE GLUCONATE CLOTH 2 % EX PADS
6.0000 | MEDICATED_PAD | Freq: Every day | CUTANEOUS | Status: DC
  Administered 2021-03-28 – 2021-03-30 (×3): 6 via TOPICAL

## 2021-03-27 MED ORDER — DARBEPOETIN ALFA 100 MCG/0.5ML IJ SOSY
100.0000 ug | PREFILLED_SYRINGE | INTRAMUSCULAR | Status: DC
  Filled 2021-03-27: qty 0.5

## 2021-03-27 NOTE — TOC Progression Note (Signed)
Transition of Care [facility]) - Progression Note    Patient Details  Name: [name] MRN: [id number] Date of Birth: 07/10/1951  Transition of Care [facility]) CM/SW [place], [place] Phone Number: 03/27/2021, 3:19 PM  Clinical Narrative:     CSW gave pt bed offers. Pt chose [facility] for SNF. CSW started insurance auth. CSW requested covid test from MD.    Expected Discharge Plan: [place] Barriers to Discharge: Continued Medical Work up,Insurance Authorization  Expected Discharge Plan and Services Expected Discharge Plan: [place] arrangements for the past 2 months: Single Family Home                                       Social Determinants of Health (SDOH) Interventions    Readmission Risk Interventions No flowsheet data found.  Emeterio Reeve, Latanya Presser, [place] Social Worker [phone]

## 2021-03-27 NOTE — Progress Notes (Signed)
Physical Therapy Treatment Patient Details Name: [name] MRN: [id number] DOB: 02/07/1951 Today's Date: 03/27/2021    History of Present Illness Pt is a 70 y.o. female who presented 4/29 with worsening abdominal distention and pain along with black, tarry stool. Pt with a GI bleed. Imaging not entirely clear if this was a mesenteric abscess at the beginning or if this really was duodenal diverticulum. PMH: ESRD on HD Tuesday Thursday Saturday, HTN, chronic anemia, GERD, mitral valve and aortic valve insufficiency, CAD, and chronic systolic heart failure.    PT Comments    Pt continues to present with poor safety awareness and required min guard for gt.  Poor ability to rise from commode required mod assistance.  Plan for SNF remains appropriate as her strength and function has been declining the last 6 months putting her at an increased risk for falls.     Follow Up Recommendations  SNF;Supervision for mobility/OOB     Equipment Recommendations  None recommended by PT    Recommendations for Other Services       Precautions / Restrictions Precautions Precautions: Fall Restrictions Weight Bearing Restrictions: No    Mobility  Bed Mobility Overal bed mobility: Modified Independent             General bed mobility comments: Increased time    Transfers Overall transfer level: Needs assistance Equipment used: Rolling walker (2 wheeled) Transfers: Sit to/from Stand Sit to Stand: Min guard;Mod assist         General transfer comment: Min Guard A for safety from recliner, mod assistance from commode in room. Assistance to boost into standing from commode.  Ambulation/Gait Ambulation/Gait assistance: Min guard Gait Distance (Feet): 80 Feet Assistive device: Rolling walker (2 wheeled) Gait Pattern/deviations: Step-through pattern;Decreased step length - right;Decreased step length - left;Decreased stride length;Trunk flexed     General Gait Details: Pt with slow,  mildly unsteady gait. Pt with decreased bil step length and kyphotic posture. No overt LOB, min guard for safety.   Stairs             Wheelchair Mobility    Modified Rankin (Stroke Patients Only)       Balance Overall balance assessment: Needs assistance Sitting-balance support: No upper extremity supported;Feet supported Sitting balance-Leahy Scale: Good       Standing balance-Leahy Scale: Fair Standing balance comment: Standing for hand hygiene                            Cognition Arousal/Alertness: Awake/alert Behavior During Therapy: WFL for tasks assessed/performed Overall Cognitive Status: Within Functional Limits for tasks assessed                                 General Comments: Possible vision deficit? running into objects on the R.      Exercises      General Comments        Pertinent Vitals/Pain Pain Assessment: Faces Pain Score: 5  Pain Location: Head Pain Descriptors / Indicators: Headache;Discomfort;Grimacing;Guarding Pain Intervention(s): Monitored during session;Repositioned    Home Living                      Prior Function            PT Goals (current goals can now be found in the care plan section) Acute Rehab PT Goals Patient Stated Goal: Return home  Potential to Achieve Goals: Good Progress towards PT goals: Progressing toward goals    Frequency    Min 2X/week      PT Plan Current plan remains appropriate    Co-evaluation              AM-PAC PT "6 Clicks" Mobility   Outcome Measure  Help needed turning from your back to your side while in a flat bed without using bedrails?: A Little Help needed moving from lying on your back to sitting on the side of a flat bed without using bedrails?: A Little Help needed moving to and from a bed to a chair (including a wheelchair)?: A Little Help needed standing up from a chair using your arms (e.g., wheelchair or bedside chair)?: A  Little Help needed to walk in hospital room?: A Little Help needed climbing 3-5 steps with a railing? : A Little 6 Click Score: 18    End of Session Equipment Utilized During Treatment: Gait belt Activity Tolerance: Patient tolerated treatment well Patient left: in bed;with bed alarm set;with family/visitor present (son in room.) Nurse Communication: Mobility status PT Visit Diagnosis: Unsteadiness on feet (R26.81);Other abnormalities of gait and mobility (R26.89);Muscle weakness (generalized) (M62.81);Difficulty in walking, not elsewhere classified (R26.2)     Time: 2248-2500 PT Time Calculation (min) (ACUTE ONLY): 15 min  Charges:  $Gait Training: 8-22 mins                     Erasmo Leventhal , PTA Acute Rehabilitation Services Pager [phone] Office [place] 03/27/2021, 5:07 PM

## 2021-03-27 NOTE — Progress Notes (Signed)
[facility] PROGRESS NOTE  Assessment/ Plan: 26F  with history of hypertension, HLD, anemia, acid reflux, ESRD on HD for last 23 years, TTS schedule at [facility], has right thigh AVG, transferred from [facility] for mesenteric abscess and possible bowel perforation, seen as a consultation for the management of ESRD.  #Mesenteric abscess vs  More likey duodenal diverticulosis; per GI and CCS.  Now augmentin  # ESRD TTS at [facility]: On schedule. Volume status and electrolytes acceptable.  She has right thigh graft for the access.  No heparin.  Need outpatient treatment record.  Next HD on 5/10   # Hypertension: Stable BPs, Minoxidil, labetalol   # Anemia of ESRD: Hb  down to 6.6 this admit; transfusion per prmary.  GI eval in process. Neg EGD 5/7. Giving ESA ?  Will order for tomorrow.  hgb really all over the place-  Will check with HD tomorrow   # Metabolic Bone Disease: P at goal. Ca ok.  On cinacalcet- no binders   Subjective:  -  Belly pain better-  Just some loose stools  Objective Vital signs in last 24 hours: Vitals:   03/26/21 1000 03/26/21 1333 03/26/21 2121 03/27/21 0320  BP: (!) 166/60 (!) 151/59 (!) 141/44 (!) 136/46  Pulse: 60 (!) 54 (!) 56 (!) 57  Resp: 16 18 16 19   Temp: (!) 97.4 F (36.3 C) 98.6 F (37 C) (!) 97.5 F (36.4 C) (!) 97.5 F (36.4 C)  TempSrc: Oral   Oral  SpO2:  100% 100% 100%  Weight:      Height:       Weight change:   Intake/Output Summary (Last 24 hours) at 03/27/2021 1109 Last data filed at 03/27/2021 0150 Gross per 24 hour  Intake 275 ml  Output --  Net 275 ml       Labs: Basic Metabolic Panel: Recent Labs  Lab 03/21/21 0230 03/23/21 0548 03/25/21 1038 03/25/21 1415  NA 131* 132* 136 135  K 3.8 3.5 2.9* 3.7  CL 95* 98 103 94*  CO2 25 26 28   --   GLUCOSE 97 109* 88 73  BUN 35* 24* 10 8  CREATININE 4.36* 4.04* 2.47* 2.30*  CALCIUM 8.5* 8.1* 8.0*  --   PHOS 3.9 3.4 1.7*  --     Liver Function Tests: Recent Labs  Lab 03/25/21 1038  ALBUMIN 1.9*   No results for input(s): LIPASE, AMYLASE in the last 168 hours. No results for input(s): AMMONIA in the last 168 hours. CBC: Recent Labs  Lab 03/22/21 1631 03/23/21 0548 03/23/21 1351 03/24/21 2230 03/25/21 0146 03/25/21 1415 03/26/21 0050  WBC 9.7 8.7  --  9.5 8.9  --  9.0  NEUTROABS  --   --   --  7.3  --   --   --   HGB 8.0* 7.0*   < > 9.3* 8.6* 11.9* 9.1*  HCT 25.2* 21.8*   < > 28.7* 26.5* 35.0* 28.0*  MCV 98.4 96.9  --  94.1 93.0  --  92.7  PLT 194 193  --  188 182  --  181   < > = values in this interval not displayed.   Cardiac Enzymes: No results for input(s): CKTOTAL, CKMB, CKMBINDEX, TROPONINI in the last 168 hours. CBG: Recent Labs  Lab 03/20/21 1524 03/20/21 1959  GLUCAP 55* 155*    Iron Studies: No results for input(s): IRON, TIBC, TRANSFERRIN, FERRITIN in the last 72 hours. Studies/Results: CT ABDOMEN  PELVIS W CONTRAST  Result Date: 03/26/2021 CLINICAL DATA:  Abdominal pain. EXAM: CT ABDOMEN AND PELVIS WITH CONTRAST TECHNIQUE: Multidetector CT imaging of the abdomen and pelvis was performed using the standard protocol following bolus administration of intravenous contrast. CONTRAST:  13mL OMNIPAQUE IOHEXOL 350 MG/ML SOLN COMPARISON:  03/19/2021. FINDINGS: Lower chest: Small bilateral effusions, increased in size from the prior CT. Dependent lower lobe opacity consistent with atelectasis. Mild enlargement of the heart. Hepatobiliary: Liver normal in size. Several low-density liver lesions are noted, stable consistent with cysts. No other liver abnormality. Gallbladder is distended. No visualized stones. Mild intra and extrahepatic bile duct dilation, common bile duct 1 cm in diameter, stable from the prior CT. Pancreas: Unremarkable. No pancreatic ductal dilatation or surrounding inflammatory changes. Spleen: Subcentimeter low-density lesion along the medial margin of the spleen stable  consistent with a cyst. Spleen otherwise unremarkable. Adrenals/Urinary Tract: No adrenal masses. Enlarged hypoattenuating kidneys with multiple cysts and calcifications. Left intrarenal collecting system is dilated with increased attenuation extending from the calices through the dilated tortuous ureter to the level of avascular clip adjacent to the psoas muscle at the level of the pelvic brim. These findings are stable. Bladder is decompressed. Small calcified transplant kidney in the left pelvis. Stomach/Bowel: The collection noted in the anterior mid to upper pelvis on the CT dated 03/18/2021 is no longer appreciated. The smaller questionable diverticulum noted in this location on the more recent CT is also no longer visualized. There are numerous colonic diverticula with no current evidence of diverticulitis. Colon and small bowel are normal in caliber. No wall thickening. No convincing mesenteric inflammation. Stomach is unremarkable. Vascular/Lymphatic: Aortic atherosclerosis. No aneurysm. Right iliac vein stent. Right inguinal region dialysis fistula, incompletely imaged, unchanged from the prior CT. Reproductive: Calcified fibroids.  No adnexal masses. Other: Ascites and diffuse peritoneal and subcutaneous fat edema. Musculoskeletal: No fracture or acute finding. Marked loss of disc height with endplate irregularity at L4-L5. Diffuse chalky appearance the bones consistent with renal osteodystrophy. Partly imaged than wall fluid collection lies posteriorly and lateral to the greater trochanter of the left proximal femur in the subcutaneous fat. This may reflect an old hematoma, but is nonspecific. Findings stable compared to the prior CT. IMPRESSION: 1. Previously seen pelvic collection, noted on the CT dated 03/18/2021, is no longer visualized. The smaller fluid density structure noted in this location on the more recent prior CT is also no longer visualized. There is currently no evidence of a  mesenteric/peritoneal abscess, and no evidence of diverticulitis or other bowel inflammatory process or of epiploic appendagitis. Numerous colonic and small bowel diverticula. 2. No free air. 3. Small pleural effusions and small amount of ascites, both increased compared to the prior CT. 4. Gallbladder remains distended, but with no wall thickening or convincing inflammation. Stable intra and extrahepatic bile duct dilation. 5. Multi-cystic enlarged kidneys are stable. Dilated left intrarenal collecting system and ureter extending to a retroperitoneal vascular clip, also unchanged. 6. Diffuse peritoneal and subcutaneous soft tissue edema without change from the prior CT. 7. Partly imaged thin walled homogeneous fluid collection in the subcutaneous fat adjacent to the left hip, probably unchanged from the prior CT. Electronically Signed   By: Lajean Manes M.D.   On: 03/26/2021 14:16    Medications: Infusions: . sodium chloride    . sodium chloride      Scheduled Medications: . amoxicillin-clavulanate  1 tablet Oral Q12H  . Chlorhexidine Gluconate Cloth  6 each Topical Q0600  . cinacalcet  30 mg Oral QPC supper  . cycloSPORINE  1 drop Both Eyes BID  . docusate sodium  100 mg Oral Daily  . isosorbide mononitrate  30 mg Oral Daily  . labetalol  100 mg Oral BID  . latanoprost  1 drop Both Eyes QHS  . minoxidil  2.5 mg Oral BID  . pantoprazole  40 mg Oral BID AC  . sodium chloride flush  3 mL Intravenous Q12H    have reviewed scheduled and prn medications.  Physical Exam: General:NAD, comfortable Heart:RRR, s1s2 nl Lungs:clear b/l, no crackle Abdomen:soft,  non-distended Extremities:No edema Dialysis Access: Right thigh AV graft has aneurysmal dilatation with good thrill and bruit.  Yerlin Gasparyan A Leonardo Makris 03/27/2021,11:09 AM  LOS: 9 days

## 2021-03-27 NOTE — Progress Notes (Addendum)
Progress Note  Chief Complaint:    Abdominal pain      ASSESSMENT / PLAN:    70 yo female with ESRD on HD TTS, HTN, hyperlipidemia, GERD, chronic anemia. Admitted to [facility] late April with abdominal pain and dark tarry stools (FOBT+). CT scan was suspicious for mesenteric abscess. Transferred to [facility] for  IR evaluation but IR didn't feel drain was warranted.  CCS is following. Repeat CT scan on 5/1 showing markedly distended gallbladder and CBD of 12 mm. The previous large fluid collection was now felt to be a large duodenal diverticulum. Multiple other gas and debris collections in mesentery felt to represent small bowel diverticula . We saw patient in consult on 5/3 to address anemia and black stool  # Melena, acute on chronic anemia  EGD on 5/7 was normal except for a 3 cm hiatal hernia. She continues to endorse loose black stools in absence of bismuth or iron.   # Acute on chronic anemia. As of yesterday her hgb seemed stable at 9.1 after a unit PRBC on 5/6. I think the hgb of 11 on 5/7 was spurious result.   --Given ongoing complaints of black stool will obtain CBC today  # Mesenteric abscess vrs duodenal diverticulum. Due to persistent abdominal pain we ordered repeat CT scan yesterday. There is no evidence for a mesenteric abscess nor diverticulitis. Gallbladder remains distended but without inflammation. Stable intra / extrahepatic biliary duct dilation, multi-cystic enlarged kidneys.      Attending Physician Note   I have taken an interval history, reviewed the chart and examined the patient. I agree with the Advanced Practitioner's note, impression and recommendations.   Melena with persistent small volume black stools. Acute on chronic anemia. Trending Hgb. Possible SB or colonic diverticular bleed. EGD showed a 3 cm hiatal hernia and was unremarkable. Check CBC.   A large duodenal diverticulum is likely what was previously thought to be a mesenteric abscess.    Persistent but improved abdominal pain, etiology is unclear.   Distended gallbladder with persistent stable intra and extrahepatic biliary dilation.  LFTs normal except t bili=1.3 on 5/2. Repeat LFTs.   Lucio Edward, MD FACG [phone]       SUBJECTIVE:   Her abdominal pain is better today.     OBJECTIVE:    Scheduled inpatient medications:  . amoxicillin-clavulanate  1 tablet Oral Q12H  . Chlorhexidine Gluconate Cloth  6 each Topical Q0600  . [START ON 03/28/2021] Chlorhexidine Gluconate Cloth  6 each Topical Q0600  . cinacalcet  30 mg Oral QPC supper  . cycloSPORINE  1 drop Both Eyes BID  . [START ON 03/28/2021] darbepoetin (ARANESP) injection - DIALYSIS  100 mcg Intravenous Q Tue-HD  . docusate sodium  100 mg Oral Daily  . isosorbide mononitrate  30 mg Oral Daily  . labetalol  100 mg Oral BID  . latanoprost  1 drop Both Eyes QHS  . minoxidil  2.5 mg Oral BID  . pantoprazole  40 mg Oral BID AC  . sodium chloride flush  3 mL Intravenous Q12H   Continuous inpatient infusions:  . sodium chloride    . sodium chloride     PRN inpatient medications: sodium chloride, acetaminophen **OR** acetaminophen, hydrALAZINE, HYDROmorphone (DILAUDID) injection, ondansetron **OR** ondansetron (ZOFRAN) IV, sodium chloride flush, traMADol  Vital signs in last 24 hours: Temp:  [97.5 F (36.4 C)] 97.5 F (36.4 C) (05/09 0320) Pulse Rate:  [56-57] 57 (05/09 0320) Resp:  [16-19]  19 (05/09 0320) BP: (136-141)/(44-46) 136/46 (05/09 0320) SpO2:  [100 %] 100 % (05/09 0320) Last BM Date: 03/26/21  Intake/Output Summary (Last 24 hours) at 03/27/2021 1455 Last data filed at 03/27/2021 1224 Gross per 24 hour  Intake 955 ml  Output --  Net 955 ml     Physical Exam:  . General: Alert female in NAD . Heart:  Regular rate and rhythm. No lower extremity edema . Pulmonary: Normal respiratory effort . Abdomen: Soft, nondistended, nontender. Normal bowel sounds.  . Neurologic: Alert and  oriented . Psych: Pleasant. Cooperative.   Filed Weights   03/25/21 0809 03/25/21 1157 03/25/21 1351  Weight: 55.8 kg 53.6 kg 54.4 kg    Intake/Output from previous day: 05/08 0701 - 05/09 0700 In: 575 [P.O.:575] Out: -  Intake/Output this shift: Total I/O In: 680 [P.O.:680] Out: -     Lab Results: Recent Labs    03/24/21 2230 03/25/21 0146 03/25/21 1415 03/26/21 0050  WBC 9.5 8.9  --  9.0  HGB 9.3* 8.6* 11.9* 9.1*  HCT 28.7* 26.5* 35.0* 28.0*  PLT 188 182  --  181   BMET Recent Labs    03/25/21 1038 03/25/21 1415  NA 136 135  K 2.9* 3.7  CL 103 94*  CO2 28  --   GLUCOSE 88 73  BUN 10 8  CREATININE 2.47* 2.30*  CALCIUM 8.0*  --    LFT Recent Labs    03/25/21 1038  ALBUMIN 1.9*   PT/INR No results for input(s): LABPROT, INR in the last 72 hours. Hepatitis Panel No results for input(s): HEPBSAG, HCVAB, HEPAIGM, HEPBIGM in the last 72 hours.  CT ABDOMEN PELVIS W CONTRAST  Result Date: 03/26/2021 CLINICAL DATA:  Abdominal pain. EXAM: CT ABDOMEN AND PELVIS WITH CONTRAST TECHNIQUE: Multidetector CT imaging of the abdomen and pelvis was performed using the standard protocol following bolus administration of intravenous contrast. CONTRAST:  55mL OMNIPAQUE IOHEXOL 350 MG/ML SOLN COMPARISON:  03/19/2021. FINDINGS: Lower chest: Small bilateral effusions, increased in size from the prior CT. Dependent lower lobe opacity consistent with atelectasis. Mild enlargement of the heart. Hepatobiliary: Liver normal in size. Several low-density liver lesions are noted, stable consistent with cysts. No other liver abnormality. Gallbladder is distended. No visualized stones. Mild intra and extrahepatic bile duct dilation, common bile duct 1 cm in diameter, stable from the prior CT. Pancreas: Unremarkable. No pancreatic ductal dilatation or surrounding inflammatory changes. Spleen: Subcentimeter low-density lesion along the medial margin of the spleen stable consistent with a cyst.  Spleen otherwise unremarkable. Adrenals/Urinary Tract: No adrenal masses. Enlarged hypoattenuating kidneys with multiple cysts and calcifications. Left intrarenal collecting system is dilated with increased attenuation extending from the calices through the dilated tortuous ureter to the level of avascular clip adjacent to the psoas muscle at the level of the pelvic brim. These findings are stable. Bladder is decompressed. Small calcified transplant kidney in the left pelvis. Stomach/Bowel: The collection noted in the anterior mid to upper pelvis on the CT dated 03/18/2021 is no longer appreciated. The smaller questionable diverticulum noted in this location on the more recent CT is also no longer visualized. There are numerous colonic diverticula with no current evidence of diverticulitis. Colon and small bowel are normal in caliber. No wall thickening. No convincing mesenteric inflammation. Stomach is unremarkable. Vascular/Lymphatic: Aortic atherosclerosis. No aneurysm. Right iliac vein stent. Right inguinal region dialysis fistula, incompletely imaged, unchanged from the prior CT. Reproductive: Calcified fibroids.  No adnexal masses. Other: Ascites and diffuse peritoneal  and subcutaneous fat edema. Musculoskeletal: No fracture or acute finding. Marked loss of disc height with endplate irregularity at L4-L5. Diffuse chalky appearance the bones consistent with renal osteodystrophy. Partly imaged than wall fluid collection lies posteriorly and lateral to the greater trochanter of the left proximal femur in the subcutaneous fat. This may reflect an old hematoma, but is nonspecific. Findings stable compared to the prior CT. IMPRESSION: 1. Previously seen pelvic collection, noted on the CT dated 03/18/2021, is no longer visualized. The smaller fluid density structure noted in this location on the more recent prior CT is also no longer visualized. There is currently no evidence of a mesenteric/peritoneal abscess, and  no evidence of diverticulitis or other bowel inflammatory process or of epiploic appendagitis. Numerous colonic and small bowel diverticula. 2. No free air. 3. Small pleural effusions and small amount of ascites, both increased compared to the prior CT. 4. Gallbladder remains distended, but with no wall thickening or convincing inflammation. Stable intra and extrahepatic bile duct dilation. 5. Multi-cystic enlarged kidneys are stable. Dilated left intrarenal collecting system and ureter extending to a retroperitoneal vascular clip, also unchanged. 6. Diffuse peritoneal and subcutaneous soft tissue edema without change from the prior CT. 7. Partly imaged thin walled homogeneous fluid collection in the subcutaneous fat adjacent to the left hip, probably unchanged from the prior CT. Electronically Signed   By: Lajean Manes M.D.   On: 03/26/2021 14:16     Principal Problem:   Mesenteric abscess (HCC) Active Problems:   Hyperlipidemia   Mitral valve regurgitation   Essential hypertension   Coronary artery disease   SYSTOLIC HEART FAILURE, CHRONIC   ESRD on dialysis ([place])   Perforation bowel ([place])   Melena   Acute blood loss anemia   Generalized abdominal pain   Hiatal hernia     LOS: 9 days   Tye Savoy ,NP 03/27/2021, 2:55 PM

## 2021-03-27 NOTE — Progress Notes (Signed)
PROGRESS NOTE  [name] SWF:[id number] DOB: [birth year] DOA: 03/17/2021 PCP: Glenda Chroman, MD   LOS: 9 days   Brief narrative:  [name] [name] a 70 y.o.femalewith medical history significant forESRD on HD Tuesday Thursday Saturday, essential hypertension, dyslipidemia, chronic anemia and GERD presented to hospital from [facility] with worsening abdominal distention and pain with black tarry stools for 2 weeks with impaired appetite as well.  In the ED patient had elevated white count at 20.3 and creatinine at 44 ESRD.  He was given vancomycin and Zosyn initially.  CT scan of the abdomen pelvis showed some large collection of fluid and gas and debris's consistent with mesenteric abscess likely related to small bowel perforation.  Patient was then admitted to hospital for further evaluation and treatment.  Subsequently, IR was consulted for possible IR guided drain but there was no drainable collection noted.  Due to black tarry stools, GI consultation was made.  GI recommended conservative treatment due to duodenal diverticulum.  Patient did have a drop in hemoglobin and was transfused 1 unit of packed RBC.  GI was reconsulted and underwent EGD on 03/25/2021 with normal findings.  Assessment/Plan:  Principal Problem:   Mesenteric abscess (HCC) Active Problems:   Hyperlipidemia   Mitral valve regurgitation   Essential hypertension   Coronary artery disease   SYSTOLIC HEART FAILURE, CHRONIC   ESRD on dialysis ([place])   Perforation bowel (HCC)   Melena   Acute blood loss anemia   Generalized abdominal pain   Hiatal hernia  Suspected mesenteric abscess with possible associated bowel perforation  CT scan from 03/19/21 without enhancing collection but possible finding of fluid density consistent with patulous diverticulum.  CT scan of the abdomen was repeated again on 03/26/2021 as per GI recommendation.  There is no acute findings in the abdomen at this time to suggest any abscess.   Patient was initially on IV Zosyn which was changed to Augmentin.  No fever.  No leukocytosis.   GI bleed with Anemia of chronic renal disease  Latest hemoglobin of 9.1.  Received 1 unit of packed RBC on 03/24/2021.  Continue to hold aspirin from home.  Status post EGD on 03/25/2021 with normal findings.  Repeat CT scan of the abdomen without any acute findings.  ESRD on HD Tuesday, Thursday Saturday - Nephology on board for hemodialysis.  Currently receiving hemodialysis.  Essential hypertension-continue Imdur, labetalol minoxidil.  Latest blood pressure of 136/46  Dyslipidemia -  Statins is on hold.  CAD -statins on hold.  Continue Imdur and labetalol. Aspirin on hold.  Debility, weakness.  Patient was seen by physical therapy and recommended skilled nursing facility placement.  Transition of care has been consulted.  DVT prophylaxis: SCDs Start: 03/18/21 1207  Code Status:  Full code  Family Communication:  Spoke with patient's son Mr. [name] on the phone and updated him about the clinical condition of the patient.  Status is: Inpatient  Remains inpatient appropriate because:IV treatments appropriate due to intensity of illness or inability to take PO and Inpatient level of care appropriate due to severity of illness,  Dispo: The patient is from: Home              Anticipated d/c is to: Skilled nursing facility placement as per PT recommendation.              Patient currently is medically stable to d/c.  We will send a COVID test today.   Difficult to place patient No  Consultants:  General surgery  Intervention radiology  Nephrology  GI  Procedures:  Hemodialysis  PRBC transfusion  Anti-infectives:  Marland Kitchen Augmentin 5/4>  Anti-infectives (From admission, onward)   Start     Dose/Rate Route Frequency Ordered Stop   03/22/21 2200  amoxicillin-clavulanate (AUGMENTIN) 500-125 MG per tablet 500 mg        1 tablet Oral Every 12 hours 03/22/21 1518     03/21/21 1200   vancomycin (VANCOCIN) IVPB 500 mg/100 ml premix  Status:  Discontinued        500 mg 100 mL/hr over 60 Minutes Intravenous Every T-Th-Sa (Hemodialysis) 03/18/21 1229 03/21/21 0651   03/21/21 1200  vancomycin (VANCOCIN) IVPB 500 mg/100 ml premix  Status:  Discontinued        500 mg 100 mL/hr over 60 Minutes Intravenous Every T-Th-Sa (Hemodialysis) 03/21/21 0651 03/21/21 0724   03/20/21 1045  piperacillin-tazobactam (ZOSYN) 2.25 g in sodium chloride 0.9 % 50 mL IVPB  Status:  Discontinued        2.25 g 100 mL/hr over 30 Minutes Intravenous Every 8 hours 03/20/21 0945 03/22/21 1510   03/18/21 1814  vancomycin (VANCOCIN) 1-5 GM/200ML-% IVPB       Note to Pharmacy: [name]   : cabinet override      03/18/21 1814 03/18/21 1819   03/18/21 1115  vancomycin (VANCOCIN) IVPB 1000 mg/200 mL premix        1,000 mg 200 mL/hr over 60 Minutes Intravenous  Once 03/18/21 1112 03/18/21 1919   03/18/21 1115  piperacillin-tazobactam (ZOSYN) IVPB 3.375 g  Status:  Discontinued        3.375 g 12.5 mL/hr over 240 Minutes Intravenous Every 12 hours 03/18/21 1112 03/20/21 0945      Subjective: Today, patient was seen and examined at bedside.  Patient denies any nausea vomiting.  Has had brown stools.  Objective: Vitals:   03/26/21 2121 03/27/21 0320  BP: (!) 141/44 (!) 136/46  Pulse: (!) 56 (!) 57  Resp: 16 19  Temp: (!) 97.5 F (36.4 C) (!) 97.5 F (36.4 C)  SpO2: 100% 100%    Intake/Output Summary (Last 24 hours) at 03/27/2021 1409 Last data filed at 03/27/2021 1224 Gross per 24 hour  Intake 955 ml  Output --  Net 955 ml   Filed Weights   03/25/21 0809 03/25/21 1157 03/25/21 1351  Weight: 55.8 kg 53.6 kg 54.4 kg   Body mass index is 20.6 kg/m.   Physical Exam: General:  , not in obvious distress, chronically ill, alert awake and communicative, thinly built HENT:   Mild pallor noted. Chest:  Clear breath sounds.  Diminished breath sounds bilaterally. No crackles or wheezes.  CVS: S1  &S2 heard. No murmur.  Regular rate and rhythm. Abdomen: Soft, nontender, nondistended.  Bowel sounds are heard.  Nonspecific tenderness over the abdomen, no guarding or rigidity, reducible umbilical hernia Extremities: No cyanosis, clubbing or edema.  Peripheral pulses are palpable. Psych: Alert, awake and communicative, normal mood CNS:  No cranial nerve deficits.  Moves all extremities. Skin: Warm and dry.  No rashes noted.   Data Review:  I have personally reviewed the following laboratory data and studies   CBC: Recent Labs  Lab 03/22/21 1631 03/23/21 0548 03/23/21 1351 03/24/21 0602 03/24/21 2230 03/25/21 0146 03/25/21 1415 03/26/21 0050  WBC 9.7 8.7  --   --  9.5 8.9  --  9.0  NEUTROABS  --   --   --   --  7.3  --   --   --  HGB 8.0* 7.0*   < > 6.6* 9.3* 8.6* 11.9* 9.1*  HCT 25.2* 21.8*   < > 21.2* 28.7* 26.5* 35.0* 28.0*  MCV 98.4 96.9  --   --  94.1 93.0  --  92.7  PLT 194 193  --   --  188 182  --  181   < > = values in this interval not displayed.   Basic Metabolic Panel: Recent Labs  Lab 03/21/21 0230 03/23/21 0548 03/25/21 1038 03/25/21 1415  NA 131* 132* 136 135  K 3.8 3.5 2.9* 3.7  CL 95* 98 103 94*  CO2 25 26 28   --   GLUCOSE 97 109* 88 73  BUN 35* 24* 10 8  CREATININE 4.36* 4.04* 2.47* 2.30*  CALCIUM 8.5* 8.1* 8.0*  --   MG 2.2 2.0  --   --   PHOS 3.9 3.4 1.7*  --    Liver Function Tests: Recent Labs  Lab 03/25/21 1038  ALBUMIN 1.9*   No results for input(s): LIPASE, AMYLASE in the last 168 hours. No results for input(s): AMMONIA in the last 168 hours. Cardiac Enzymes: No results for input(s): CKTOTAL, CKMB, CKMBINDEX, TROPONINI in the last 168 hours. BNP (last 3 results) No results for input(s): BNP in the last 8760 hours.  ProBNP (last 3 results) No results for input(s): PROBNP in the last 8760 hours.  CBG: Recent Labs  Lab 03/20/21 1524 03/20/21 1959  GLUCAP 55* 155*   Recent Results (from the past 240 hour(s))  Resp Panel  by RT-PCR (Flu A&B, Covid) Nasopharyngeal Swab     Status: None   Collection Time: 03/18/21  8:15 AM   Specimen: Nasopharyngeal Swab; Nasopharyngeal(NP) swabs in vial transport medium  Result Value Ref Range Status   SARS Coronavirus 2 by RT PCR NEGATIVE NEGATIVE Final    Comment: (NOTE) SARS-CoV-2 target nucleic acids are NOT DETECTED.  The SARS-CoV-2 RNA is generally detectable in upper respiratory specimens during the acute phase of infection. The lowest concentration of SARS-CoV-2 viral copies this assay can detect is 138 copies/mL. A negative result does not preclude SARS-Cov-2 infection and should not be used as the sole basis for treatment or other patient management decisions. A negative result may occur with  improper specimen collection/handling, submission of specimen other than nasopharyngeal swab, presence of viral mutation(s) within the areas targeted by this assay, and inadequate number of viral copies(<138 copies/mL). A negative result must be combined with clinical observations, patient history, and epidemiological information. The expected result is Negative.  Fact Sheet for Patients:  EntrepreneurPulse.com.au  Fact Sheet for Healthcare Providers:  IncredibleEmployment.be  This test is no t yet approved or cleared by the [place] FDA and  has been authorized for detection and/or diagnosis of SARS-CoV-2 by FDA under an Emergency Use Authorization (EUA). This EUA will remain  in effect (meaning this test can be used) for the duration of the COVID-19 declaration under Section 564(b)(1) of the Act, 21 U.S.C.section 360bbb-3(b)(1), unless the authorization is terminated  or revoked sooner.       Influenza A by PCR NEGATIVE NEGATIVE Final   Influenza B by PCR NEGATIVE NEGATIVE Final    Comment: (NOTE) The Xpert Xpress SARS-CoV-2/FLU/RSV plus assay is intended as an aid in the diagnosis of influenza from Nasopharyngeal swab  specimens and should not be used as a sole basis for treatment. Nasal washings and aspirates are unacceptable for Xpert Xpress SARS-CoV-2/FLU/RSV testing.  Fact Sheet for Patients: EntrepreneurPulse.com.au  Fact Sheet for  Healthcare Providers: IncredibleEmployment.be  This test is not yet approved or cleared by the [place] and has been authorized for detection and/or diagnosis of SARS-CoV-2 by FDA under an Emergency Use Authorization (EUA). This EUA will remain in effect (meaning this test can be used) for the duration of the COVID-19 declaration under Section 564(b)(1) of the Act, 21 U.S.C. section 360bbb-3(b)(1), unless the authorization is terminated or revoked.  Performed at [facility], [address], [place], [place] [address]      Studies: CT ABDOMEN PELVIS W CONTRAST  Result Date: 03/26/2021 CLINICAL DATA:  Abdominal pain. EXAM: CT ABDOMEN AND PELVIS WITH CONTRAST TECHNIQUE: Multidetector CT imaging of the abdomen and pelvis was performed using the standard protocol following bolus administration of intravenous contrast. CONTRAST:  31mL OMNIPAQUE IOHEXOL 350 MG/ML SOLN COMPARISON:  03/19/2021. FINDINGS: Lower chest: Small bilateral effusions, increased in size from the prior CT. Dependent lower lobe opacity consistent with atelectasis. Mild enlargement of the heart. Hepatobiliary: Liver normal in size. Several low-density liver lesions are noted, stable consistent with cysts. No other liver abnormality. Gallbladder is distended. No visualized stones. Mild intra and extrahepatic bile duct dilation, common bile duct 1 cm in diameter, stable from the prior CT. Pancreas: Unremarkable. No pancreatic ductal dilatation or surrounding inflammatory changes. Spleen: Subcentimeter low-density lesion along the medial margin of the spleen stable consistent with a cyst. Spleen otherwise unremarkable. Adrenals/Urinary Tract: No adrenal masses. Enlarged  hypoattenuating kidneys with multiple cysts and calcifications. Left intrarenal collecting system is dilated with increased attenuation extending from the calices through the dilated tortuous ureter to the level of avascular clip adjacent to the psoas muscle at the level of the pelvic brim. These findings are stable. Bladder is decompressed. Small calcified transplant kidney in the left pelvis. Stomach/Bowel: The collection noted in the anterior mid to upper pelvis on the CT dated 03/18/2021 is no longer appreciated. The smaller questionable diverticulum noted in this location on the more recent CT is also no longer visualized. There are numerous colonic diverticula with no current evidence of diverticulitis. Colon and small bowel are normal in caliber. No wall thickening. No convincing mesenteric inflammation. Stomach is unremarkable. Vascular/Lymphatic: Aortic atherosclerosis. No aneurysm. Right iliac vein stent. Right inguinal region dialysis fistula, incompletely imaged, unchanged from the prior CT. Reproductive: Calcified fibroids.  No adnexal masses. Other: Ascites and diffuse peritoneal and subcutaneous fat edema. Musculoskeletal: No fracture or acute finding. Marked loss of disc height with endplate irregularity at L4-L5. Diffuse chalky appearance the bones consistent with renal osteodystrophy. Partly imaged than wall fluid collection lies posteriorly and lateral to the greater trochanter of the left proximal femur in the subcutaneous fat. This may reflect an old hematoma, but is nonspecific. Findings stable compared to the prior CT. IMPRESSION: 1. Previously seen pelvic collection, noted on the CT dated 03/18/2021, is no longer visualized. The smaller fluid density structure noted in this location on the more recent prior CT is also no longer visualized. There is currently no evidence of a mesenteric/peritoneal abscess, and no evidence of diverticulitis or other bowel inflammatory process or of epiploic  appendagitis. Numerous colonic and small bowel diverticula. 2. No free air. 3. Small pleural effusions and small amount of ascites, both increased compared to the prior CT. 4. Gallbladder remains distended, but with no wall thickening or convincing inflammation. Stable intra and extrahepatic bile duct dilation. 5. Multi-cystic enlarged kidneys are stable. Dilated left intrarenal collecting system and ureter extending to a retroperitoneal vascular clip, also unchanged. 6.  Diffuse peritoneal and subcutaneous soft tissue edema without change from the prior CT. 7. Partly imaged thin walled homogeneous fluid collection in the subcutaneous fat adjacent to the left hip, probably unchanged from the prior CT. Electronically Signed   By: Lajean Manes M.D.   On: 03/26/2021 14:16     Flora Lipps, MD  [facility] 03/27/2021  If 7PM-7AM, please contact night-coverage

## 2021-03-28 DIAGNOSIS — I10 Essential (primary) hypertension: Secondary | ICD-10-CM | POA: Diagnosis not present

## 2021-03-28 DIAGNOSIS — I5022 Chronic systolic (congestive) heart failure: Secondary | ICD-10-CM | POA: Diagnosis not present

## 2021-03-28 DIAGNOSIS — K651 Peritoneal abscess: Secondary | ICD-10-CM | POA: Diagnosis not present

## 2021-03-28 DIAGNOSIS — N186 End stage renal disease: Secondary | ICD-10-CM | POA: Diagnosis not present

## 2021-03-28 LAB — IRON AND TIBC: Iron: 60 ug/dL (ref 28–170)

## 2021-03-28 LAB — BPAM RBC
Blood Product Expiration Date: 202205222359
Blood Product Expiration Date: 202206112359
ISSUE DATE / TIME: 202205061554
Unit Type and Rh: 5100
Unit Type and Rh: 5100

## 2021-03-28 LAB — CBC
HCT: 26.4 % — ABNORMAL LOW (ref 36.0–46.0)
HCT: 24.9 % — ABNORMAL LOW (ref 36.0–46.0)
Hemoglobin: 8.5 g/dL — ABNORMAL LOW (ref 12.0–15.0)
Hemoglobin: 8 g/dL — ABNORMAL LOW (ref 12.0–15.0)
MCH: 30.2 pg (ref 26.0–34.0)
MCH: 30.2 pg (ref 26.0–34.0)
MCHC: 32.2 g/dL (ref 30.0–36.0)
MCHC: 32.1 g/dL (ref 30.0–36.0)
MCV: 94 fL (ref 80.0–100.0)
MCV: 94 fL (ref 80.0–100.0)
Platelets: 151 10*3/uL (ref 150–400)
Platelets: 157 10*3/uL (ref 150–400)
RBC: 2.81 MIL/uL — ABNORMAL LOW (ref 3.87–5.11)
RBC: 2.65 MIL/uL — ABNORMAL LOW (ref 3.87–5.11)
RDW: 15.4 % (ref 11.5–15.5)
RDW: 15.5 % (ref 11.5–15.5)
WBC: 8.1 10*3/uL (ref 4.0–10.5)
WBC: 7.6 10*3/uL (ref 4.0–10.5)
nRBC: 0 % (ref 0.0–0.2)
nRBC: 0 % (ref 0.0–0.2)

## 2021-03-28 LAB — RENAL FUNCTION PANEL
Albumin: 2 g/dL — ABNORMAL LOW (ref 3.5–5.0)
Anion gap: 9 (ref 5–15)
BUN: 24 mg/dL — ABNORMAL HIGH (ref 8–23)
CO2: 24 mmol/L (ref 22–32)
Calcium: 7.7 mg/dL — ABNORMAL LOW (ref 8.9–10.3)
Chloride: 94 mmol/L — ABNORMAL LOW (ref 98–111)
Creatinine, Ser: 4.86 mg/dL — ABNORMAL HIGH (ref 0.44–1.00)
GFR, Estimated: 9 mL/min — ABNORMAL LOW (ref >60)
Glucose, Bld: 83 mg/dL (ref 70–99)
Phosphorus: 3.8 mg/dL (ref 2.5–4.6)
Potassium: 4.2 mmol/L (ref 3.5–5.1)
Sodium: 127 mmol/L — ABNORMAL LOW (ref 135–145)

## 2021-03-28 LAB — BASIC METABOLIC PANEL
Anion gap: 8 (ref 5–15)
BUN: 23 mg/dL (ref 8–23)
CO2: 25 mmol/L (ref 22–32)
Calcium: 7.7 mg/dL — ABNORMAL LOW (ref 8.9–10.3)
Chloride: 94 mmol/L — ABNORMAL LOW (ref 98–111)
Creatinine, Ser: 4.61 mg/dL — ABNORMAL HIGH (ref 0.44–1.00)
GFR, Estimated: 10 mL/min — ABNORMAL LOW (ref >60)
Glucose, Bld: 102 mg/dL — ABNORMAL HIGH (ref 70–99)
Potassium: 4 mmol/L (ref 3.5–5.1)
Sodium: 127 mmol/L — ABNORMAL LOW (ref 135–145)

## 2021-03-28 LAB — TYPE AND SCREEN
ABO/RH(D): O POS
Antibody Screen: POSITIVE
DAT, IgG: POSITIVE
Donor AG Type: NEGATIVE
Donor AG Type: NEGATIVE
Unit division: 0
Unit division: 0

## 2021-03-28 LAB — FERRITIN: Ferritin: 493 ng/mL — ABNORMAL HIGH (ref 11–307)

## 2021-03-28 LAB — PHOSPHORUS: Phosphorus: 3.8 mg/dL (ref 2.5–4.6)

## 2021-03-28 MED ORDER — DARBEPOETIN ALFA 100 MCG/0.5ML IJ SOSY
PREFILLED_SYRINGE | INTRAMUSCULAR | Status: AC
  Administered 2021-03-28: 100 ug via INTRAVENOUS
  Filled 2021-03-28: qty 0.5

## 2021-03-28 MED ORDER — PEG-KCL-NACL-NASULF-NA ASC-C 100 G PO SOLR
0.5000 | Freq: Once | ORAL | Status: AC
  Administered 2021-03-28: 100 g via ORAL
  Filled 2021-03-28: qty 1

## 2021-03-28 MED ORDER — PEG-KCL-NACL-NASULF-NA ASC-C 100 G PO SOLR: 1.0000 | Freq: Once | ORAL | Status: DC

## 2021-03-28 MED ORDER — PEG-KCL-NACL-NASULF-NA ASC-C 100 G PO SOLR
0.5000 | Freq: Once | ORAL | Status: AC
  Administered 2021-03-29: 100 g via ORAL
  Filled 2021-03-28: qty 1

## 2021-03-28 NOTE — H&P (View-Only) (Signed)
Progress Note  Chief Complaint:  Abdominal pain , dark stool        ASSESSMENT / PLAN:    70 yo female with ESRD on HD TTS, HTN, hyperlipidemia, GERD, chronic anemia. Admitted to [facility] late April with abdominal pain and dark tarry stools (FOBT+). CT scan was suspicious for mesenteric abscess. Transferred to [facility] for  IR evaluation but IR didn't feel drain was warranted.  [facility] is following. Repeat CT scan on 5/1 showing markedly distended gallbladder and CBD of 12 mm. The previous large fluid collection was now felt to be a large duodenal diverticulum. Multiple other gas and debris collections in mesentery felt to represent small bowel diverticula . We saw patient in consult on 5/3 to address anemia and black stool  # Melena, acute on chronic anemia.   She continues to endorse loose black stools in absence of bismuth or iron and hgb slowly drifting 9.1 >> 8.7 >> 8.5 >> 8. EGD was unrevealing. Small bowel bleeding? Proximal colon source?  --We discussed proceeding with colonoscopy tomorrow. The risks and benefits of colonoscopy with possible polypectomy / biopsies were discussed and the patient agrees to proceed. She ate solids for lunch but since stools have been loose it shouldn't be hard to purge bowels  # Mesenteric abscess vrs duodenal diverticulum. Repeat CT scan two days ago without evidence for a mesenteric abscess nor diverticulitis. Gallbladder remains distended but without inflammation. Stable intra / extrahepatic biliary duct dilation, multi-cystic enlarged kidneys.  -am liver chemistries  # ESRD on HD. She dialyzed today     Attending Physician Note   I have taken an interval history, reviewed the chart and examined the patient. I agree with the Advanced Practitioner's note, impression and recommendations.   Melena continues with worsening anemia. Proceed with colonoscopy tomorrow.   Repeat LFTs are pending.   Lucio Edward, MD FACG [phone]         SUBJECTIVE:   Abdominal pain is better today. Still having dark but not black stools.     OBJECTIVE:    Scheduled inpatient medications:  . Chlorhexidine Gluconate Cloth  6 each Topical Q0600  . Chlorhexidine Gluconate Cloth  6 each Topical Q0600  . cinacalcet  30 mg Oral QPC supper  . cycloSPORINE  1 drop Both Eyes BID  . darbepoetin (ARANESP) injection - DIALYSIS  100 mcg Intravenous Q Tue-HD  . docusate sodium  100 mg Oral Daily  . isosorbide mononitrate  30 mg Oral Daily  . labetalol  100 mg Oral BID  . latanoprost  1 drop Both Eyes QHS  . minoxidil  2.5 mg Oral BID  . pantoprazole  40 mg Oral BID AC  . sodium chloride flush  3 mL Intravenous Q12H   Continuous inpatient infusions:  . sodium chloride    . sodium chloride     PRN inpatient medications: sodium chloride, acetaminophen **OR** acetaminophen, hydrALAZINE, HYDROmorphone (DILAUDID) injection, ondansetron **OR** ondansetron (ZOFRAN) IV, sodium chloride flush, traMADol  Vital signs in last 24 hours: Temp:  [97.4 F (36.3 C)-98.4 F (36.9 C)] 98.4 F (36.9 C) (05/10 1051) Pulse Rate:  [56-65] 64 (05/10 1400) Resp:  [13-18] 16 (05/10 1051) BP: (105-140)/(41-59) 126/43 (05/10 1400) SpO2:  [99 %-100 %] 99 % (05/10 1400) Weight:  [56.6 kg-57.6 kg] 56.6 kg (05/10 1051) Last BM Date: 03/26/21  Intake/Output Summary (Last 24 hours) at 03/28/2021 1518 Last data filed at 03/28/2021 1051 Gross per 24 hour  Intake 460 ml  Output 1000 ml  Net -540 ml     Physical Exam:  . General: Alert female in NAD . Heart:  Regular rate and rhythm. No lower extremity edema . Pulmonary: Normal respiratory effort . Abdomen: Soft, mildly distended, nontender. Normal bowel sounds.  . Neurologic: Alert and oriented . Psych: Pleasant. Cooperative.   Filed Weights   03/25/21 1351 03/28/21 0715 03/28/21 1051  Weight: 54.4 kg 57.6 kg 56.6 kg    Intake/Output from previous day: 05/09 0701 - 05/10 0700 In: 1140 [P.O.:1140] Out: -   Intake/Output this shift: Total I/O In: -  Out: 1000 [Other:1000]    Lab Results: Recent Labs    03/27/21 1549 03/28/21 0213 03/28/21 0639  WBC 7.0 8.1 7.6  HGB 8.7* 8.5* 8.0*  HCT 26.8* 26.4* 24.9*  PLT 153 151 157   BMET Recent Labs    03/28/21 0213 03/28/21 0639  NA 127* 127*  K 4.0 4.2  CL 94* 94*  CO2 25 24  GLUCOSE 102* 83  BUN 23 24*  CREATININE 4.61* 4.86*  CALCIUM 7.7* 7.7*   LFT Recent Labs    03/28/21 0639  ALBUMIN 2.0*   PT/INR No results for input(s): LABPROT, INR in the last 72 hours. Hepatitis Panel No results for input(s): HEPBSAG, HCVAB, HEPAIGM, HEPBIGM in the last 72 hours.  No results found.  Principal Problem:   Mesenteric abscess ([place]) Active Problems:   Hyperlipidemia   Mitral valve regurgitation   Essential hypertension   Coronary artery disease   SYSTOLIC HEART FAILURE, CHRONIC   ESRD on dialysis ([place])   Perforation bowel (HCC)   Melena   Acute blood loss anemia   Generalized abdominal pain   Hiatal hernia   Abnormal CT scan, small bowel     LOS: 10 days   Tye Savoy ,NP 03/28/2021, 3:18 PM

## 2021-03-28 NOTE — Progress Notes (Signed)
Occupational Therapy Treatment Patient Details Name: [name] MRN: [id number] DOB: [birth year] Today's Date: 03/28/2021    History of present illness Pt is a 70 y.o. female who presented 4/29 with worsening abdominal distention and pain along with black, tarry stool. Pt with a GI bleed. Imaging not entirely clear if this was a mesenteric abscess at the beginning or if this really was duodenal diverticulum. PMH: ESRD on HD Tuesday Thursday Saturday, HTN, chronic anemia, GERD, mitral valve and aortic valve insufficiency, CAD, and chronic systolic heart failure.   OT comments  Pt is progressing well towards OT goals. She is able to ambulate with min guard for safety, and activity tolerance is improving. Pt has difficulty powering up from handicap height toilet, due to not having sufficient area to press up on next to her (she has difficulty pushing up from grab bars due to their height). Pt was very motivated to complete toileting and grooming and go for a short walk this session. OT will continue following to assist in increasing activity tolerance and Progressing OT goals.    Follow Up Recommendations  SNF (Need to discuss future plan for at home care as pt is needing more assistance for IADL's and safety)    Equipment Recommendations  None recommended by OT    Recommendations for Other Services      Precautions / Restrictions Precautions Precautions: Fall       Mobility Bed Mobility Overal bed mobility: Modified Independent             General bed mobility comments: Increased time    Transfers Overall transfer level: Needs assistance Equipment used: Rolling walker (2 wheeled) Transfers: Sit to/from Stand Sit to Stand: Min guard;Min assist         General transfer comment: Min Guard A for safety from bed, min assistance from commode in bathroom. Assistance to boost into standing from commode.    Balance Overall balance assessment: Needs assistance Sitting-balance  support: No upper extremity supported;Feet supported Sitting balance-Leahy Scale: Good Sitting balance - Comments: Able to adjust socks without LOB   Standing balance support: No upper extremity supported;Bilateral upper extremity supported;During functional activity Standing balance-Leahy Scale: Fair Standing balance comment: Standing for hand hygiene                           ADL either performed or assessed with clinical judgement   ADL Overall ADL's : Needs assistance/impaired     Grooming: Wash/dry hands;Wash/dry face;Min guard;Standing Grooming Details (indicate cue type and reason): completed at sink     Lower Body Bathing: Min guard;Sit to/from stand Lower Body Bathing Details (indicate cue type and reason): completed standing from toilet     Lower Body Dressing: Supervision/safety;Sitting/lateral leans;Sit to/from stand Lower Body Dressing Details (indicate cue type and reason): donned socks and pulled up mesh panties Toilet Transfer: RW;Minimal assistance;Ambulation;Comfort height toilet Toilet Transfer Details (indicate cue type and reason): Ambulated to bathroom and completed transfer on and off handicap height toilet. required min assist to power up from toilet. Otherwise transfer is min guard for safety Toileting- Clothing Manipulation and Hygiene: Min guard;Sitting/lateral lean;Sit to/from stand Toileting - Clothing Manipulation Details (indicate cue type and reason): completed toileting hygiene standing at toilet     Functional mobility during ADLs: Min guard;Rolling walker General ADL Comments: Pt activity tolerance is increasing. Pt was very motivated to ambulate this session. Completed toileting and grooming with min guard for safety. Required min assist  to stand from handicap height toilet.     Vision   Vision Assessment?: Vision impaired- to be further tested in functional context Additional Comments: Pt has difficulties keeping her eyes open.  Additionally her vision on the R side is limited.   Perception     Praxis      Cognition Arousal/Alertness: Awake/alert Behavior During Therapy: WFL for tasks assessed/performed Overall Cognitive Status: Within Functional Limits for tasks assessed                                          Exercises     Shoulder Instructions       General Comments VSS onRA    Pertinent Vitals/ Pain       Pain Assessment: No/denies pain  Home Living                                          Prior Functioning/Environment              Frequency  Min 2X/week        Progress Toward Goals  OT Goals(current goals can now be found in the care plan section)  Progress towards OT goals: Progressing toward goals  Acute Rehab OT Goals Patient Stated Goal: Return home OT Goal Formulation: With patient/family Time For Goal Achievement: 04/07/21 Potential to Achieve Goals: Good ADL Goals Pt Will Perform Lower Body Dressing: with modified independence;sit to/from stand Pt Will Transfer to Toilet: with modified independence;ambulating;bedside commode Pt Will Perform Toileting - Clothing Manipulation and hygiene: with modified independence;sitting/lateral leans;sit to/from stand Additional ADL Goal #1: Pt will independently verbalize three energy conservation techniques for ADLs and IADLs Additional ADL Goal #2: Pt will perform simple IADL at Mod I level  Plan Discharge plan remains appropriate;Frequency remains appropriate    Co-evaluation                 AM-PAC OT "6 Clicks" Daily Activity     Outcome Measure   Help from another person eating meals?: None Help from another person taking care of personal grooming?: A Little Help from another person toileting, which includes using toliet, bedpan, or urinal?: A Little Help from another person bathing (including washing, rinsing, drying)?: A Little Help from another person to put on and taking off  regular upper body clothing?: A Little Help from another person to put on and taking off regular lower body clothing?: A Little 6 Click Score: 19    End of Session Equipment Utilized During Treatment: Rolling walker  OT Visit Diagnosis: Other abnormalities of gait and mobility (R26.89);Unsteadiness on feet (R26.81);Muscle weakness (generalized) (M62.81)   Activity Tolerance Patient tolerated treatment well   Patient Left in chair;with call bell/phone within reach;with family/visitor present;with nursing/sitter in room   Nurse Communication Mobility status        Time: 2774-1287 OT Time Calculation (min): 14 min  Charges: OT General Charges $OT Visit: 1 Visit OT Treatments $Self Care/Home Management : 8-22 mins  Andrews Tener H., OTR/L Acute Rehabilitation  Latoshia Monrroy Elane Berkley Wrightsman 03/28/2021, 3:27 PM

## 2021-03-28 NOTE — Progress Notes (Addendum)
Progress Note  Chief Complaint:  Abdominal pain , dark stool        ASSESSMENT / PLAN:    70 yo female with ESRD on HD TTS, HTN, hyperlipidemia, GERD, chronic anemia. Admitted to [facility] late April with abdominal pain and dark tarry stools (FOBT+). CT scan was suspicious for mesenteric abscess. Transferred to [facility] for  IR evaluation but IR didn't feel drain was warranted.  CCS is following. Repeat CT scan on 5/1 showing markedly distended gallbladder and CBD of 12 mm. The previous large fluid collection was now felt to be a large duodenal diverticulum. Multiple other gas and debris collections in mesentery felt to represent small bowel diverticula . We saw patient in consult on 5/3 to address anemia and black stool  # Melena, acute on chronic anemia.   She continues to endorse loose black stools in absence of bismuth or iron and hgb slowly drifting 9.1 >> 8.7 >> 8.5 >> 8. EGD was unrevealing. Small bowel bleeding? Proximal colon source?  --We discussed proceeding with colonoscopy tomorrow. The risks and benefits of colonoscopy with possible polypectomy / biopsies were discussed and the patient agrees to proceed. She ate solids for lunch but since stools have been loose it shouldn't be hard to purge bowels  # Mesenteric abscess vrs duodenal diverticulum. Repeat CT scan two days ago without evidence for a mesenteric abscess nor diverticulitis. Gallbladder remains distended but without inflammation. Stable intra / extrahepatic biliary duct dilation, multi-cystic enlarged kidneys.  -am liver chemistries  # ESRD on HD. She dialyzed today     Attending Physician Note   I have taken an interval history, reviewed the chart and examined the patient. I agree with the Advanced Practitioner's note, impression and recommendations.   Melena continues with worsening anemia. Proceed with colonoscopy tomorrow.   Repeat LFTs are pending.   Lucio Edward, MD FACG [phone]         SUBJECTIVE:   Abdominal pain is better today. Still having dark but not black stools.     OBJECTIVE:    Scheduled inpatient medications:  . Chlorhexidine Gluconate Cloth  6 each Topical Q0600  . Chlorhexidine Gluconate Cloth  6 each Topical Q0600  . cinacalcet  30 mg Oral QPC supper  . cycloSPORINE  1 drop Both Eyes BID  . darbepoetin (ARANESP) injection - DIALYSIS  100 mcg Intravenous Q Tue-HD  . docusate sodium  100 mg Oral Daily  . isosorbide mononitrate  30 mg Oral Daily  . labetalol  100 mg Oral BID  . latanoprost  1 drop Both Eyes QHS  . minoxidil  2.5 mg Oral BID  . pantoprazole  40 mg Oral BID AC  . sodium chloride flush  3 mL Intravenous Q12H   Continuous inpatient infusions:  . sodium chloride    . sodium chloride     PRN inpatient medications: sodium chloride, acetaminophen **OR** acetaminophen, hydrALAZINE, HYDROmorphone (DILAUDID) injection, ondansetron **OR** ondansetron (ZOFRAN) IV, sodium chloride flush, traMADol  Vital signs in last 24 hours: Temp:  [97.4 F (36.3 C)-98.4 F (36.9 C)] 98.4 F (36.9 C) (05/10 1051) Pulse Rate:  [56-65] 64 (05/10 1400) Resp:  [13-18] 16 (05/10 1051) BP: (105-140)/(41-59) 126/43 (05/10 1400) SpO2:  [99 %-100 %] 99 % (05/10 1400) Weight:  [56.6 kg-57.6 kg] 56.6 kg (05/10 1051) Last BM Date: 03/26/21  Intake/Output Summary (Last 24 hours) at 03/28/2021 1518 Last data filed at 03/28/2021 1051 Gross per 24 hour  Intake 460 ml  Output 1000 ml  Net -540 ml     Physical Exam:  . General: Alert female in NAD . Heart:  Regular rate and rhythm. No lower extremity edema . Pulmonary: Normal respiratory effort . Abdomen: Soft, mildly distended, nontender. Normal bowel sounds.  . Neurologic: Alert and oriented . Psych: Pleasant. Cooperative.   Filed Weights   03/25/21 1351 03/28/21 0715 03/28/21 1051  Weight: 54.4 kg 57.6 kg 56.6 kg    Intake/Output from previous day: 05/09 0701 - 05/10 0700 In: 1140 [P.O.:1140] Out: -   Intake/Output this shift: Total I/O In: -  Out: 1000 [Other:1000]    Lab Results: Recent Labs    03/27/21 1549 03/28/21 0213 03/28/21 0639  WBC 7.0 8.1 7.6  HGB 8.7* 8.5* 8.0*  HCT 26.8* 26.4* 24.9*  PLT 153 151 157   BMET Recent Labs    03/28/21 0213 03/28/21 0639  NA 127* 127*  K 4.0 4.2  CL 94* 94*  CO2 25 24  GLUCOSE 102* 83  BUN 23 24*  CREATININE 4.61* 4.86*  CALCIUM 7.7* 7.7*   LFT Recent Labs    03/28/21 0639  ALBUMIN 2.0*   PT/INR No results for input(s): LABPROT, INR in the last 72 hours. Hepatitis Panel No results for input(s): HEPBSAG, HCVAB, HEPAIGM, HEPBIGM in the last 72 hours.  No results found.  Principal Problem:   Mesenteric abscess ([place]) Active Problems:   Hyperlipidemia   Mitral valve regurgitation   Essential hypertension   Coronary artery disease   SYSTOLIC HEART FAILURE, CHRONIC   ESRD on dialysis ([place])   Perforation bowel (HCC)   Melena   Acute blood loss anemia   Generalized abdominal pain   Hiatal hernia   Abnormal CT scan, small bowel     LOS: 10 days   Tye Savoy ,NP 03/28/2021, 3:18 PM

## 2021-03-28 NOTE — Progress Notes (Signed)
Hemodialysis- Completed without issue. UF 1L. Tolerated well. Patient currently has no complaints. Report called to [facility]

## 2021-03-28 NOTE — Progress Notes (Signed)
[facility] NEPHROLOGY PROGRESS NOTE  Assessment/ Plan: 58F  with history of hypertension, HLD, anemia, acid reflux, ESRD on HD for 23 years, TTS schedule at [facility], has right thigh AVG, transferred from [facility] for mesenteric abscess and possible bowel perforation, seen as a consultation for the management of ESRD.  #Mesenteric abscess vs  More likey duodenal diverticulosis; per GI and CCS.  Now augmentin  # ESRD TTS at [facility]: On schedule. Volume status and electrolytes acceptable.  She has right thigh graft for the access.  No heparin.    Next HD on 5/12   # Hypertension: Stable BPs, Minoxidil, labetalol -  Actually maybe a little too low on very low doses   # Anemia of ESRD: Hb  down to 6.6 this admit; transfusion per prmary.  GI eval in process. Neg EGD 5/7. ESA - ordered for today.  hgb really all over the place-  Currently 8.0  # Metabolic Bone Disease: P at goal. Ca ok.  On cinacalcet- no binders   Subjective:  -  Belly pain better-  Just some loose stools- seen on HD   Objective Vital signs in last 24 hours: Vitals:   03/28/21 0730 03/28/21 0800 03/28/21 0830 03/28/21 0900  BP: (!) 118/52 (!) 105/41 (!) 115/49 (!) 109/48  Pulse: 64     Resp:   13 17  Temp:      TempSrc:      SpO2:      Weight:      Height:       Weight change:   Intake/Output Summary (Last 24 hours) at 03/28/2021 0918 Last data filed at 03/27/2021 1822 Gross per 24 hour  Intake 1020 ml  Output --  Net 1020 ml       Labs: Basic Metabolic Panel: Recent Labs  Lab 03/25/21 1038 03/25/21 1415 03/28/21 0213 03/28/21 0639  NA 136 135 127* 127*  K 2.9* 3.7 4.0 4.2  CL 103 94* 94* 94*  CO2 28  --  25 24  GLUCOSE 88 73 102* 83  BUN 10 8 23  24*  CREATININE 2.47* 2.30* 4.61* 4.86*  CALCIUM 8.0*  --  7.7* 7.7*  PHOS 1.7*  --  3.8 3.8   Liver Function Tests: Recent Labs  Lab 03/25/21 1038 03/28/21 0639  ALBUMIN 1.9* 2.0*   No results for input(s):  LIPASE, AMYLASE in the last 168 hours. No results for input(s): AMMONIA in the last 168 hours. CBC: Recent Labs  Lab 03/24/21 2230 03/25/21 0146 03/25/21 1415 03/26/21 0050 03/27/21 1549 03/28/21 0213 03/28/21 0639  WBC 9.5 8.9  --  9.0 7.0 8.1 7.6  NEUTROABS 7.3  --   --   --   --   --   --   HGB 9.3* 8.6*   < > 9.1* 8.7* 8.5* 8.0*  HCT 28.7* 26.5*   < > 28.0* 26.8* 26.4* 24.9*  MCV 94.1 93.0  --  92.7 94.0 94.0 94.0  PLT 188 182  --  181 153 151 157   < > = values in this interval not displayed.   Cardiac Enzymes: No results for input(s): CKTOTAL, CKMB, CKMBINDEX, TROPONINI in the last 168 hours. CBG: No results for input(s): GLUCAP in the last 168 hours.  Iron Studies: No results for input(s): IRON, TIBC, TRANSFERRIN, FERRITIN in the last 72 hours. Studies/Results: CT ABDOMEN PELVIS W CONTRAST  Result Date: 03/26/2021 CLINICAL DATA:  Abdominal pain. EXAM: CT ABDOMEN AND PELVIS WITH CONTRAST TECHNIQUE:  Multidetector CT imaging of the abdomen and pelvis was performed using the standard protocol following bolus administration of intravenous contrast. CONTRAST:  11mL OMNIPAQUE IOHEXOL 350 MG/ML SOLN COMPARISON:  03/19/2021. FINDINGS: Lower chest: Small bilateral effusions, increased in size from the prior CT. Dependent lower lobe opacity consistent with atelectasis. Mild enlargement of the heart. Hepatobiliary: Liver normal in size. Several low-density liver lesions are noted, stable consistent with cysts. No other liver abnormality. Gallbladder is distended. No visualized stones. Mild intra and extrahepatic bile duct dilation, common bile duct 1 cm in diameter, stable from the prior CT. Pancreas: Unremarkable. No pancreatic ductal dilatation or surrounding inflammatory changes. Spleen: Subcentimeter low-density lesion along the medial margin of the spleen stable consistent with a cyst. Spleen otherwise unremarkable. Adrenals/Urinary Tract: No adrenal masses. Enlarged hypoattenuating  kidneys with multiple cysts and calcifications. Left intrarenal collecting system is dilated with increased attenuation extending from the calices through the dilated tortuous ureter to the level of avascular clip adjacent to the psoas muscle at the level of the pelvic brim. These findings are stable. Bladder is decompressed. Small calcified transplant kidney in the left pelvis. Stomach/Bowel: The collection noted in the anterior mid to upper pelvis on the CT dated 03/18/2021 is no longer appreciated. The smaller questionable diverticulum noted in this location on the more recent CT is also no longer visualized. There are numerous colonic diverticula with no current evidence of diverticulitis. Colon and small bowel are normal in caliber. No wall thickening. No convincing mesenteric inflammation. Stomach is unremarkable. Vascular/Lymphatic: Aortic atherosclerosis. No aneurysm. Right iliac vein stent. Right inguinal region dialysis fistula, incompletely imaged, unchanged from the prior CT. Reproductive: Calcified fibroids.  No adnexal masses. Other: Ascites and diffuse peritoneal and subcutaneous fat edema. Musculoskeletal: No fracture or acute finding. Marked loss of disc height with endplate irregularity at L4-L5. Diffuse chalky appearance the bones consistent with renal osteodystrophy. Partly imaged than wall fluid collection lies posteriorly and lateral to the greater trochanter of the left proximal femur in the subcutaneous fat. This may reflect an old hematoma, but is nonspecific. Findings stable compared to the prior CT. IMPRESSION: 1. Previously seen pelvic collection, noted on the CT dated 03/18/2021, is no longer visualized. The smaller fluid density structure noted in this location on the more recent prior CT is also no longer visualized. There is currently no evidence of a mesenteric/peritoneal abscess, and no evidence of diverticulitis or other bowel inflammatory process or of epiploic appendagitis.  Numerous colonic and small bowel diverticula. 2. No free air. 3. Small pleural effusions and small amount of ascites, both increased compared to the prior CT. 4. Gallbladder remains distended, but with no wall thickening or convincing inflammation. Stable intra and extrahepatic bile duct dilation. 5. Multi-cystic enlarged kidneys are stable. Dilated left intrarenal collecting system and ureter extending to a retroperitoneal vascular clip, also unchanged. 6. Diffuse peritoneal and subcutaneous soft tissue edema without change from the prior CT. 7. Partly imaged thin walled homogeneous fluid collection in the subcutaneous fat adjacent to the left hip, probably unchanged from the prior CT. Electronically Signed   By: Lajean Manes M.D.   On: 03/26/2021 14:16    Medications: Infusions: . sodium chloride    . sodium chloride      Scheduled Medications: . amoxicillin-clavulanate  1 tablet Oral Q12H  . Chlorhexidine Gluconate Cloth  6 each Topical Q0600  . Chlorhexidine Gluconate Cloth  6 each Topical Q0600  . cinacalcet  30 mg Oral QPC supper  . cycloSPORINE  1 drop Both Eyes BID  . Darbepoetin Alfa      . darbepoetin (ARANESP) injection - DIALYSIS  100 mcg Intravenous Q Tue-HD  . docusate sodium  100 mg Oral Daily  . isosorbide mononitrate  30 mg Oral Daily  . labetalol  100 mg Oral BID  . latanoprost  1 drop Both Eyes QHS  . minoxidil  2.5 mg Oral BID  . pantoprazole  40 mg Oral BID AC  . sodium chloride flush  3 mL Intravenous Q12H    have reviewed scheduled and prn medications.  Physical Exam: General:NAD, comfortable Heart:RRR, s1s2 nl Lungs:clear b/l, no crackle Abdomen:soft,  non-distended Extremities:No edema Dialysis Access: Right thigh AV graft has aneurysmal dilatation with good thrill and bruit.  Antione Obar A Miaa Latterell 03/28/2021,9:18 AM  LOS: 10 days

## 2021-03-28 NOTE — Procedures (Signed)
Patient was seen on dialysis and the procedure was supervised.  BFR 400  Via AVG BP is  109/48.   Patient appears to be tolerating treatment well  [name] 03/28/2021

## 2021-03-28 NOTE — Progress Notes (Signed)
PROGRESS NOTE  [name]:[id number] DOB: February 07, 1951 DOA: 03/17/2021 PCP: Glenda Chroman, MD   LOS: 10 days   Brief narrative:  Brittany Thoennes [name] a 70 y.o.femalewith medical history significant forESRD on HD Tuesday Thursday Saturday, essential hypertension, dyslipidemia, chronic anemia and GERD presented to hospital from [facility] with worsening abdominal distention and pain with black tarry stools for 2 weeks with impaired appetite as well.  In the ED patient had elevated white count at 20.3 and creatinine at 44 ESRD.  He was given vancomycin and Zosyn initially.  CT scan of the abdomen pelvis showed some large collection of fluid and gas and debris's consistent with mesenteric abscess likely related to small bowel perforation.  Patient was then admitted to hospital for further evaluation and treatment.  Subsequently, IR was consulted for possible IR guided drain but there was no drainable collection noted.  Due to black tarry stools, GI consultation was made.  GI recommended conservative treatment due to duodenal diverticulum.  Patient did have a drop in hemoglobin and was transfused 1 unit of packed RBC.  GI was reconsulted and underwent EGD on 03/25/2021 with normal findings.  At this time, patient has overall improvement in hemoglobin has remained stable.  Awaiting for skilled nursing facility placement.   Assessment/Plan:  Principal Problem:   Mesenteric abscess (HCC) Active Problems:   Hyperlipidemia   Mitral valve regurgitation   Essential hypertension   Coronary artery disease   SYSTOLIC HEART FAILURE, CHRONIC   ESRD on dialysis ([place])   Perforation bowel (HCC)   Melena   Acute blood loss anemia   Generalized abdominal pain   Hiatal hernia   Abnormal CT scan, small bowel  Suspected mesenteric abscess with possible associated bowel perforation  CT scan from 03/19/21 without enhancing collection but possible finding of fluid density consistent with patulous  diverticulum.  CT scan of the abdomen was repeated again on 03/26/2021 as per GI recommendation.  There was no acute findings in the abdomen at this time to suggest any abscess or diverticulitis..  Patient was initially on IV Zosyn which was changed to Augmentin.  Will discontinue  Augmentin at this time.  No fever.  No leukocytosis.   GI bleed with Anemia of chronic renal disease  Latest hemoglobin of 9.1.  Received  packed RBC on 03/24/2021.  Continue to hold aspirin from home.  Status post EGD on 03/25/2021 with normal findings.  Repeat CT scan of the abdomen without any acute findings.  We will check CBC in the a.m.  ESRD on HD Tuesday, Thursday Saturday - Nephology on board for hemodialysis.  Currently receiving hemodialysis.  Essential hypertension-continue Imdur, labetalol minoxidil.  Latest blood pressure of 136/46  Dyslipidemia -  Statins is on hold.  CAD -statins on hold.  Continue Imdur and labetalol. Aspirin on hold.  Debility, weakness.  Patient was seen by physical therapy and recommended skilled nursing facility placement.  Transition of care has been consulted.  DVT prophylaxis: SCDs Start: 03/18/21 1207  Code Status:  Full code  Family Communication:  None today.  Spoke with patient's son Mr. [name] on the phone and updated him about the clinical condition of the patient.  Status is: Inpatient  Remains inpatient appropriate because:, awaiting for rehabilitation   Dispo: The patient is from: Home              Anticipated d/c is to: Skilled nursing facility placement as per PT recommendation.  Patient currently is medically stable to d/c.     Difficult to place patient No  Consultants:  General surgery  Intervention radiology  Nephrology  GI  Procedures:  Hemodialysis  PRBC transfusion  Anti-infectives:  Marland Kitchen Augmentin 5/4>5/10  Anti-infectives (From admission, onward)   Start     Dose/Rate Route Frequency Ordered Stop   03/22/21 2200   amoxicillin-clavulanate (AUGMENTIN) 500-125 MG per tablet 500 mg        1 tablet Oral Every 12 hours 03/22/21 1518     03/21/21 1200  vancomycin (VANCOCIN) IVPB 500 mg/100 ml premix  Status:  Discontinued        500 mg 100 mL/hr over 60 Minutes Intravenous Every T-Th-Sa (Hemodialysis) 03/18/21 1229 03/21/21 0651   03/21/21 1200  vancomycin (VANCOCIN) IVPB 500 mg/100 ml premix  Status:  Discontinued        500 mg 100 mL/hr over 60 Minutes Intravenous Every T-Th-Sa (Hemodialysis) 03/21/21 0651 03/21/21 0724   03/20/21 1045  piperacillin-tazobactam (ZOSYN) 2.25 g in sodium chloride 0.9 % 50 mL IVPB  Status:  Discontinued        2.25 g 100 mL/hr over 30 Minutes Intravenous Every 8 hours 03/20/21 0945 03/22/21 1510   03/18/21 1814  vancomycin (VANCOCIN) 1-5 GM/200ML-% IVPB       Note to Pharmacy: [name]   : cabinet override      03/18/21 1814 03/18/21 1819   03/18/21 1115  vancomycin (VANCOCIN) IVPB 1000 mg/200 mL premix        1,000 mg 200 mL/hr over 60 Minutes Intravenous  Once 03/18/21 1112 03/18/21 1919   03/18/21 1115  piperacillin-tazobactam (ZOSYN) IVPB 3.375 g  Status:  Discontinued        3.375 g 12.5 mL/hr over 240 Minutes Intravenous Every 12 hours 03/18/21 1112 03/20/21 0945      Subjective: Today, patient was seen and examined at bedside.  Patient denies any nausea vomiting but has mild abdominal pain.  Has had bowel movements.   Objective: Vitals:   03/28/21 1030 03/28/21 1051  BP: (!) 121/51 (!) 122/46  Pulse: 62 65  Resp:  16  Temp:  98.4 F (36.9 C)  SpO2:  100%    Intake/Output Summary (Last 24 hours) at 03/28/2021 1354 Last data filed at 03/28/2021 1051 Gross per 24 hour  Intake 460 ml  Output 1000 ml  Net -540 ml   Filed Weights   03/25/21 1351 03/28/21 0715 03/28/21 1051  Weight: 54.4 kg 57.6 kg 56.6 kg   Body mass index is 21.42 kg/m.   Physical Exam: General:  Average built, not in obvious distress, appears chronically ill,  communicative, HENT:   Mild pallor noted.. Oral mucosa is moist.  Chest:  Clear breath sounds.  Diminished breath sounds bilaterally. No crackles or wheezes.  CVS: S1 &S2 heard. No murmur.  Regular rate and rhythm. Abdomen: Soft, nonspecific tenderness on palpation no guarding or rigidity, reducible umbilical hernia nondistended.  Bowel sounds are heard.   Extremities: No cyanosis, clubbing or edema.  Peripheral pulses are palpable. Psych: Alert, awake and oriented, normal mood CNS:  No cranial nerve deficits.  Power equal in all extremities.   Skin: Warm and dry.  No rashes noted.   Data Review:  I have personally reviewed the following laboratory data and studies   CBC: Recent Labs  Lab 03/24/21 2230 03/25/21 0146 03/25/21 1415 03/26/21 0050 03/27/21 1549 03/28/21 0213 03/28/21 0639  WBC 9.5 8.9  --  9.0 7.0 8.1 7.6  NEUTROABS 7.3  --   --   --   --   --   --   HGB 9.3* 8.6* 11.9* 9.1* 8.7* 8.5* 8.0*  HCT 28.7* 26.5* 35.0* 28.0* 26.8* 26.4* 24.9*  MCV 94.1 93.0  --  92.7 94.0 94.0 94.0  PLT 188 182  --  181 153 151 094   Basic Metabolic Panel: Recent Labs  Lab 03/23/21 0548 03/25/21 1038 03/25/21 1415 03/28/21 0213 03/28/21 0639  NA 132* 136 135 127* 127*  K 3.5 2.9* 3.7 4.0 4.2  CL 98 103 94* 94* 94*  CO2 26 28  --  25 24  GLUCOSE 109* 88 73 102* 83  BUN 24* 10 8 23  24*  CREATININE 4.04* 2.47* 2.30* 4.61* 4.86*  CALCIUM 8.1* 8.0*  --  7.7* 7.7*  MG 2.0  --   --   --   --   PHOS 3.4 1.7*  --  3.8 3.8   Liver Function Tests: Recent Labs  Lab 03/25/21 1038 03/28/21 0639  ALBUMIN 1.9* 2.0*   No results for input(s): LIPASE, AMYLASE in the last 168 hours. No results for input(s): AMMONIA in the last 168 hours. Cardiac Enzymes: No results for input(s): CKTOTAL, CKMB, CKMBINDEX, TROPONINI in the last 168 hours. BNP (last 3 results) No results for input(s): BNP in the last 8760 hours.  ProBNP (last 3 results) No results for input(s): PROBNP in the last 8760  hours.  CBG: No results for input(s): GLUCAP in the last 168 hours. Recent Results (from the past 240 hour(s))  Resp Panel by RT-PCR (Flu A&B, Covid) Nasopharyngeal Swab     Status: None   Collection Time: 03/27/21  3:39 PM   Specimen: Nasopharyngeal Swab; Nasopharyngeal(NP) swabs in vial transport medium  Result Value Ref Range Status   SARS Coronavirus 2 by RT PCR NEGATIVE NEGATIVE Final    Comment: (NOTE) SARS-CoV-2 target nucleic acids are NOT DETECTED.  The SARS-CoV-2 RNA is generally detectable in upper respiratory specimens during the acute phase of infection. The lowest concentration of SARS-CoV-2 viral copies this assay can detect is 138 copies/mL. A negative result does not preclude SARS-Cov-2 infection and should not be used as the sole basis for treatment or other patient management decisions. A negative result may occur with  improper specimen collection/handling, submission of specimen other than nasopharyngeal swab, presence of viral mutation(s) within the areas targeted by this assay, and inadequate number of viral copies(<138 copies/mL). A negative result must be combined with clinical observations, patient history, and epidemiological information. The expected result is Negative.  Fact Sheet for Patients:  EntrepreneurPulse.com.au  Fact Sheet for Healthcare Providers:  IncredibleEmployment.be  This test is no t yet approved or cleared by the [place] FDA and  has been authorized for detection and/or diagnosis of SARS-CoV-2 by FDA under an Emergency Use Authorization (EUA). This EUA will remain  in effect (meaning this test can be used) for the duration of the COVID-19 declaration under Section 564(b)(1) of the Act, 21 U.S.C.section 360bbb-3(b)(1), unless the authorization is terminated  or revoked sooner.       Influenza A by PCR NEGATIVE NEGATIVE Final   Influenza B by PCR NEGATIVE NEGATIVE Final    Comment:  (NOTE) The Xpert Xpress SARS-CoV-2/FLU/RSV plus assay is intended as an aid in the diagnosis of influenza from Nasopharyngeal swab specimens and should not be used as a sole basis for treatment. Nasal washings and aspirates are unacceptable for Xpert Xpress SARS-CoV-2/FLU/RSV testing.  Fact Sheet  for Patients: EntrepreneurPulse.com.au  Fact Sheet for Healthcare Providers: IncredibleEmployment.be  This test is not yet approved or cleared by the [place] FDA and has been authorized for detection and/or diagnosis of SARS-CoV-2 by FDA under an Emergency Use Authorization (EUA). This EUA will remain in effect (meaning this test can be used) for the duration of the COVID-19 declaration under Section 564(b)(1) of the Act, 21 U.S.C. section 360bbb-3(b)(1), unless the authorization is terminated or revoked.  Performed at [facility], [place] [address] [place], [place] [address]      Studies: No results found.   Flora Lipps, MD  [facility] 03/28/2021  If 7PM-7AM, please contact night-coverage

## 2021-03-29 ENCOUNTER — Encounter (HOSPITAL_COMMUNITY): Payer: Self-pay | Admitting: Internal Medicine

## 2021-03-29 ENCOUNTER — Inpatient Hospital Stay (HOSPITAL_COMMUNITY): Payer: Medicare Other | Admitting: Anesthesiology

## 2021-03-29 ENCOUNTER — Encounter (HOSPITAL_COMMUNITY)
Admission: EM | Disposition: A | Discharge status: Skilled Nursing Facility | Payer: Self-pay | Source: Home / Self Care | Attending: Internal Medicine

## 2021-03-29 DIAGNOSIS — D124 Benign neoplasm of descending colon: Secondary | ICD-10-CM

## 2021-03-29 HISTORY — PX: POLYPECTOMY: SHX5525

## 2021-03-29 HISTORY — PX: COLONOSCOPY WITH PROPOFOL: SHX5780

## 2021-03-29 LAB — CBC
HCT: 25.5 % — ABNORMAL LOW (ref 36.0–46.0)
Hemoglobin: 8.2 g/dL — ABNORMAL LOW (ref 12.0–15.0)
MCH: 30.3 pg (ref 26.0–34.0)
MCHC: 32.2 g/dL (ref 30.0–36.0)
MCV: 94.1 fL (ref 80.0–100.0)
Platelets: 144 10*3/uL — ABNORMAL LOW (ref 150–400)
RBC: 2.71 MIL/uL — ABNORMAL LOW (ref 3.87–5.11)
RDW: 15.4 % (ref 11.5–15.5)
WBC: 7.5 10*3/uL (ref 4.0–10.5)
nRBC: 0 % (ref 0.0–0.2)

## 2021-03-29 LAB — HEPATIC FUNCTION PANEL
ALT: 11 U/L (ref 0–44)
AST: 19 U/L (ref 15–41)
Albumin: 2.2 g/dL — ABNORMAL LOW (ref 3.5–5.0)
Alkaline Phosphatase: 77 U/L (ref 38–126)
Bilirubin, Direct: 0.1 mg/dL (ref 0.0–0.2)
Indirect Bilirubin: 0.8 mg/dL (ref 0.3–0.9)
Total Bilirubin: 0.9 mg/dL (ref 0.3–1.2)
Total Protein: 6.2 g/dL — ABNORMAL LOW (ref 6.5–8.1)

## 2021-03-29 SURGERY — COLONOSCOPY WITH PROPOFOL
Anesthesia: Monitor Anesthesia Care

## 2021-03-29 MED ORDER — LIDOCAINE 2% (20 MG/ML) 5 ML SYRINGE
INTRAMUSCULAR | Status: DC | PRN
  Administered 2021-03-29: 40 mg via INTRAVENOUS

## 2021-03-29 MED ORDER — PROPOFOL 10 MG/ML IV BOLUS
INTRAVENOUS | Status: DC | PRN
  Administered 2021-03-29 (×3): 25 mg via INTRAVENOUS

## 2021-03-29 MED ORDER — PHENYLEPHRINE HCL-NACL 10-0.9 MG/250ML-% IV SOLN
INTRAVENOUS | Status: DC | PRN
  Administered 2021-03-29: 25 ug/min via INTRAVENOUS

## 2021-03-29 MED ORDER — SODIUM CHLORIDE 0.9 % IV SOLN: INTRAVENOUS | Status: DC

## 2021-03-29 MED ORDER — DICYCLOMINE HCL 10 MG PO CAPS: 10.0000 mg | ORAL_CAPSULE | Freq: Three times a day (TID) | ORAL | Status: DC | PRN

## 2021-03-29 MED ORDER — PROPOFOL 500 MG/50ML IV EMUL
INTRAVENOUS | Status: DC | PRN
  Administered 2021-03-29: 75 ug/kg/min via INTRAVENOUS

## 2021-03-29 MED ORDER — SODIUM CHLORIDE 0.9 % IV SOLN: INTRAVENOUS | Status: DC | PRN

## 2021-03-29 MED ORDER — CHLORHEXIDINE GLUCONATE CLOTH 2 % EX PADS
6.0000 | MEDICATED_PAD | Freq: Every day | CUTANEOUS | Status: DC
  Administered 2021-03-30: 6 via TOPICAL

## 2021-03-29 SURGICAL SUPPLY — 22 items

## 2021-03-29 NOTE — Interval H&P Note (Signed)
History and Physical Interval Note:  03/29/2021 12:46 PM  [name]  has presented today for surgery, with the diagnosis of anemia, black stool.  The various methods of treatment have been discussed with the patient and family. After consideration of risks, benefits and other options for treatment, the patient has consented to  Procedure(s): COLONOSCOPY WITH PROPOFOL (N/A) as a surgical intervention.  The patient's history has been reviewed, patient examined, no change in status, stable for surgery.  I have reviewed the patient's chart and labs.  Questions were answered to the patient's satisfaction.     Pricilla Riffle. [place] Plan

## 2021-03-29 NOTE — TOC Progression Note (Addendum)
Transition of Care [facility]) - Progression Note    Patient Details  Name: [name] MRN: [id number] Date of Birth: [birth year]  Transition of Care [facility]) CM/SW [place], [place] Phone Number: 03/29/2021, 11:46 AM  Clinical Narrative:     Pts insurance Josem Kaufmann has been approved, auth ID [id number]. Care coordinator is Tiffany Spero [place] is good for May 10-12.   Pt has colonoscopy scheduled for 12pm. MD is hopeful that pt will be able to leave after that. [facility] can accept pt at DC. They will need DC summary by 3pm.   Pt receives HD in [facility] at 5:45am, facility aware.   2:28pm- GI would like to monitor pt overnight. CSW requested new covid test. CSW informed [facility] of DC on 03/30/21.  Expected Discharge Plan: Skilled Nursing Facility Barriers to Discharge: Continued Medical Work up,Insurance Authorization  Expected Discharge Plan and Services Expected Discharge Plan: [place] arrangements for the past 2 months: Single Family Home                                       Social Determinants of Health (SDOH) Interventions    Readmission Risk Interventions No flowsheet data found.  Emeterio Reeve, Latanya Presser, [place] Social Worker [phone]

## 2021-03-29 NOTE — Op Note (Signed)
[facility] Patient Name: [name] Procedure Date : 03/29/2021 MRN: [id number] Attending MD: Ladene Artist , MD Date of Birth: 12-04-1950 CSN: [id number] Age: 70 Admit Type: Inpatient Procedure:                Colonoscopy Indications:              Melena, Acute post hemorrhagic anemia Providers:                Pricilla Riffle. Fuller Plan, MD, Brooke Person, Ladona Ridgel, Technician Referring MD:             [facility] Medicines:                Monitored Anesthesia Care Complications:            No immediate complications. Estimated blood loss:                            None. Estimated Blood Loss:     Estimated blood loss: none. Procedure:                Pre-Anesthesia Assessment:                           - Prior to the procedure, a History and Physical                            was performed, and patient medications and                            allergies were reviewed. The patient's tolerance of                            previous anesthesia was also reviewed. The risks                            and benefits of the procedure and the sedation                            options and risks were discussed with the patient.                            All questions were answered, and informed consent                            was obtained. Prior Anticoagulants: The patient has                            taken no previous anticoagulant or antiplatelet                            agents. ASA Grade Assessment: III - A patient with                            severe  systemic disease. After reviewing the risks                            and benefits, the patient was deemed in                            satisfactory condition to undergo the procedure.                           After obtaining informed consent, the colonoscope                            was passed under direct vision. Throughout the                            procedure, the patient's blood  pressure, pulse, and                            oxygen saturations were monitored continuously. The                            [id number]-HQ190L ([id number]) Olympus colonoscope was                            introduced through the anus and advanced to the the                            terminal ileum, with identification of the                            appendiceal orifice and IC valve. The terminal                            ileum, ileocecal valve, appendiceal orifice, and                            rectum were photographed. The quality of the bowel                            preparation was good. The colonoscopy was performed                            without difficulty. The patient tolerated the                            procedure well. Scope In: 1:27:59 PM Scope Out: 1:42:16 PM Scope Withdrawal Time: 0 hours 12 minutes 40 seconds  Total Procedure Duration: 0 hours 14 minutes 17 seconds  Findings:      The perianal and digital rectal examinations were normal.      The terminal ileum appeared normal.      A few medium-mouthed diverticula were found in the right colon. There       was no evidence of diverticular bleeding.      A 7 mm polyp was found in the descending  colon. The polyp was sessile.       The polyp was removed with a cold snare. Resection and retrieval were       complete.      Multiple medium-mouthed diverticula were found in the left colon. There       was narrowing of the colon in association with the diverticular opening.       There was evidence of diverticular spasm. There was no evidence of       diverticular bleeding.      Internal hemorrhoids were found during retroflexion. The hemorrhoids       were moderate and Grade I (internal hemorrhoids that do not prolapse).      The exam was otherwise without abnormality on direct and retroflexion       views. There was no old or fresh blood in the colon or TI. Impression:               - Normal terminal ileum.                            - Mild diverticulosis in the right colon.                           - One 7 mm polyp in the descending colon, removed                            with a cold snare. Resected and retrieved.                           - Moderate diverticulosis in the left colon.                           - Internal hemorrhoids.                           - The examination was otherwise normal on direct                            and retroflexion views. No old or fresh blood in                            the colon or TI. Recommendation:           - Consider repeat colonoscopy after studies are                            complete for surveillance based on pathology                            results - decsion and timing per Dr. Laural Golden.                           - Patient has a contact number available for                            emergencies. The signs and symptoms of potential  delayed complications were discussed with the                            patient. Return to normal activities tomorrow.                            Written discharge instructions were provided to the                            patient.                           - Resume previous diet.                           - Continue present medications.                           - Await pathology results.                           - Suspect this was a small bowel or colonic                            diverticular bleed that has resolved.                           - No aspirin, ibuprofen, naproxen, or other                            non-steroidal anti-inflammatory drugs for 2 weeks.                           - No additional GI evaluation is recommended at                            this time.                           - Start dicyclomine 10 mg po tid prn for abdominal                            pain.                           - If stable should be ok for discharge tomorrow                            from GI  standpoint.                           - GI signing off.                           - Outpatient GI follow up with Dr. Hildred Laser. Procedure Code(s):        --- Professional ---  45385, Colonoscopy, flexible; with removal of                            tumor(s), polyp(s), or other lesion(s) by snare                            technique Diagnosis Code(s):        --- Professional ---                           K64.0, First degree hemorrhoids                           K63.5, Polyp of colon                           K92.1, Melena (includes Hematochezia)                           D62, Acute posthemorrhagic anemia                           K57.30, Diverticulosis of large intestine without                            perforation or abscess without bleeding CPT copyright 2019 American Medical Association. All rights reserved. The codes documented in this report are preliminary and upon coder review may  be revised to meet current compliance requirements. Ladene Artist, MD 03/29/2021 1:56:05 PM This report has been signed electronically. Number of Addenda: 0

## 2021-03-29 NOTE — Progress Notes (Signed)
[facility] NEPHROLOGY PROGRESS NOTE  Assessment/ Plan: [name]  with history of hypertension, HLD, anemia, acid reflux, ESRD on HD for 23 years, TTS schedule at [facility],  right thigh AVG, transferred from [facility] for mesenteric abscess and possible bowel perforation, seen as a consultation for the management of ESRD.  #Mesenteric abscess vs  More likey duodenal diverticulosis; per GI and CCS.  Now augmentin  # ESRD TTS at [facility]: On schedule. Volume status and electrolytes acceptable.  She has right thigh graft for the access.  No heparin.    Next HD on 5/12   # Hypertension: Stable BPs, Minoxidil, labetalol -  Actually maybe a little too low on very low doses   # Anemia of ESRD: Hb  down to 6.6 this admit; transfusion per prmary.  GI eval in process. Neg EGD 5/7. Darbe 100 given 5/10.  hgb really all over the place-  Currently 8.2  # Metabolic Bone Disease: P at goal. Ca ok.  On cinacalcet- no binders   Subjective:  -  HD yest , removed 1000- tolerated well.  Gi planning for colonoscopy ?  - PT concerned she will need SNF  Objective Vital signs in last 24 hours: Vitals:   03/28/21 1030 03/28/21 1051 03/28/21 1400 03/28/21 2107  BP: (!) 121/51 (!) 122/46 (!) 126/43 (!) 118/45  Pulse: 62 65 64 61  Resp:  16  18  Temp:  98.4 F (36.9 C)  97.8 F (36.6 C)  TempSrc:  Oral  Oral  SpO2:  100% 99% 100%  Weight:  56.6 kg    Height:       Weight change:   Intake/Output Summary (Last 24 hours) at 03/29/2021 1143 Last data filed at 03/29/2021 0934 Gross per 24 hour  Intake 120 ml  Output --  Net 120 ml       Labs: Basic Metabolic Panel: Recent Labs  Lab 03/25/21 1038 03/25/21 1415 03/28/21 0213 03/28/21 0639  NA 136 135 127* 127*  K 2.9* 3.7 4.0 4.2  CL 103 94* 94* 94*  CO2 28  --  25 24  GLUCOSE 88 73 102* 83  BUN 10 8 23  24*  CREATININE 2.47* 2.30* 4.61* 4.86*  CALCIUM 8.0*  --  7.7* 7.7*  PHOS 1.7*  --  3.8 3.8   Liver  Function Tests: Recent Labs  Lab 03/25/21 1038 03/28/21 0639 03/29/21 0145  AST  --   --  19  ALT  --   --  11  ALKPHOS  --   --  77  BILITOT  --   --  0.9  PROT  --   --  6.2*  ALBUMIN 1.9* 2.0* 2.2*   No results for input(s): LIPASE, AMYLASE in the last 168 hours. No results for input(s): AMMONIA in the last 168 hours. CBC: Recent Labs  Lab 03/24/21 2230 03/25/21 0146 03/26/21 0050 03/27/21 1549 03/28/21 0213 03/28/21 0639 03/29/21 0145  WBC 9.5   < > 9.0 7.0 8.1 7.6 7.5  NEUTROABS 7.3  --   --   --   --   --   --   HGB 9.3*   < > 9.1* 8.7* 8.5* 8.0* 8.2*  HCT 28.7*   < > 28.0* 26.8* 26.4* 24.9* 25.5*  MCV 94.1   < > 92.7 94.0 94.0 94.0 94.1  PLT 188   < > 181 153 151 157 144*   < > = values in this interval not displayed.  Cardiac Enzymes: No results for input(s): CKTOTAL, CKMB, CKMBINDEX, TROPONINI in the last 168 hours. CBG: No results for input(s): GLUCAP in the last 168 hours.  Iron Studies:  Recent Labs    03/28/21 0640  IRON 60  TIBC NOT CALCULATED  FERRITIN 493*   Studies/Results: No results found.  Medications: Infusions: . sodium chloride    . sodium chloride      Scheduled Medications: . Chlorhexidine Gluconate Cloth  6 each Topical Q0600  . Chlorhexidine Gluconate Cloth  6 each Topical Q0600  . cinacalcet  30 mg Oral QPC supper  . cycloSPORINE  1 drop Both Eyes BID  . darbepoetin (ARANESP) injection - DIALYSIS  100 mcg Intravenous Q Tue-HD  . docusate sodium  100 mg Oral Daily  . isosorbide mononitrate  30 mg Oral Daily  . labetalol  100 mg Oral BID  . latanoprost  1 drop Both Eyes QHS  . minoxidil  2.5 mg Oral BID  . pantoprazole  40 mg Oral BID AC  . sodium chloride flush  3 mL Intravenous Q12H    have reviewed scheduled and prn medications.  Physical Exam: General:NAD, comfortable Heart:RRR, s1s2 nl Lungs:clear b/l, no crackle Abdomen:soft,  non-distended Extremities:No edema Dialysis Access: Right thigh AV graft has  aneurysmal dilatation with good thrill and bruit.  [name] A Marty Sadlowski 03/29/2021,11:43 AM  LOS: 11 days

## 2021-03-29 NOTE — Anesthesia Procedure Notes (Signed)
Procedure Name: MAC Date/Time: 03/29/2021 1:17 PM Performed by: Renato Shin, CRNA Pre-anesthesia Checklist: Patient identified, Emergency Drugs available, Suction available and Patient being monitored Patient Re-evaluated:Patient Re-evaluated prior to induction Oxygen Delivery Method: Nasal cannula Preoxygenation: Pre-oxygenation with 100% oxygen Induction Type: IV induction Placement Confirmation: positive ETCO2 and breath sounds checked- equal and bilateral Dental Injury: Teeth and Oropharynx as per pre-operative assessment

## 2021-03-29 NOTE — Anesthesia Postprocedure Evaluation (Signed)
Anesthesia Post Note  Patient: [name]  Procedure(s) Performed: COLONOSCOPY WITH PROPOFOL (N/A ) POLYPECTOMY     Patient location during evaluation: PACU Anesthesia Type: MAC Level of consciousness: awake and alert Pain management: pain level controlled Vital Signs Assessment: post-procedure vital signs reviewed and stable Respiratory status: spontaneous breathing, nonlabored ventilation, respiratory function stable and patient connected to nasal cannula oxygen Cardiovascular status: stable and blood pressure returned to baseline Postop Assessment: no apparent nausea or vomiting Anesthetic complications: no   No complications documented.  Last Vitals:  Vitals:   03/29/21 1410 03/29/21 1421  BP: (!) 127/41 (!) 121/35  Pulse: 60 (!) 59  Resp: 18 13  Temp:    SpO2: 100% 100%    Last Pain:  Vitals:   03/29/21 1421  TempSrc:   PainSc: 0-No pain                 Mang Hazelrigg

## 2021-03-29 NOTE — Transfer of Care (Signed)
Immediate Anesthesia Transfer of Care Note  Patient: [name]  Procedure(s) Performed: COLONOSCOPY WITH PROPOFOL (N/A ) POLYPECTOMY  Patient Location: PACU and Endoscopy Unit  Anesthesia Type:MAC  Level of Consciousness: drowsy and patient cooperative  Airway & Oxygen Therapy: Patient Spontanous Breathing and Patient connected to nasal cannula oxygen  Post-op Assessment: Report given to RN, Post -op Vital signs reviewed and stable and Neo gtt  Post vital signs: Reviewed and stable  Last Vitals:  Vitals Value Taken Time  BP 86/58 03/29/21 1354  Temp 36.4 C 03/29/21 1350  Pulse 60 03/29/21 1354  Resp 15 03/29/21 1354  SpO2 100 % 03/29/21 1354  Vitals shown include unvalidated device data.  Last Pain:  Vitals:   03/29/21 1350  TempSrc: Temporal  PainSc: 0-No pain      Patients Stated Pain Goal: 0 (39/12/25 8346)  Complications: No complications documented.

## 2021-03-29 NOTE — Progress Notes (Signed)
PROGRESS NOTE    [name]  JKD:[id number] DOB: Apr 12, 1951 DOA: 03/17/2021 PCP: Glenda Chroman, MD    Brief Narrative:  [name] a 70 y.o.femalewith medical history significant forESRD on HD Tuesday Thursday Saturday, essential hypertension, dyslipidemia, chronic anemia and GERD presented to [facility] with worsening abdominal distention and pain with black tarry stools for 2 weeks with impaired appetite as well. In the ED patient had elevated white count at 20.3 . She was given vancomycin and Zosyn initially. CT scan of the abdomen pelvis showed some large collection of fluid and gas and debris's suspected mesenteric abscess likely related to small bowel perforation. Patient was then admitted to hospital for further evaluation and treatment.  Subsequently,IR was consulted for possible IR guided drain but there was no drainable collection noted. Due to black tarry stools, GI consultation was made. GI recommended conservative treatment due to duodenal diverticulum. Patient did have a drop in hemoglobin and was transfused 1 unit of packed RBC. GI was reconsulted and underwent EGD on 03/25/2021 with normal findings. Patient underwent colonoscopy 5/11, found to have left-sided diverticulosis, internal hemorrhoids, 7 mm polyp that was removed.   Assessment & Plan:   Principal Problem:   Mesenteric abscess ([place]) Active Problems:   Hyperlipidemia   Mitral valve regurgitation   Essential hypertension   Coronary artery disease   SYSTOLIC HEART FAILURE, CHRONIC   ESRD on dialysis ([place])   Perforation bowel ([facility])   Melena   Acute blood loss anemia   Generalized abdominal pain   Hiatal hernia   Abnormal CT scan, small bowel   Benign neoplasm of descending colon  Suspected mesenteric abscess with possible associated bowel perforation: Ruled out. CT scan from 03/19/21 without enhancing collection but possible finding of fluid density consistent with patulous  diverticulum. CT scan of the abdomen was repeated again on 03/26/2021 as per GI recommendation. There wasno acute findings in the abdomen at this time to suggest any abscessor diverticulitis. Patient was treated with broad-spectrum antibiotics.  No evidence of ongoing fever.  She is off antibiotics.  Abdomen pain improved.  GI bleed with Anemia of chronic renal disease Baseline hemoglobin 9.1.   Received 1 unit of PRBC on 5/6.   EGD essentially normal.   Colonoscopy today with diverticulosis, internal hemorrhoids, 7 mm polyp removed. Repeat CT scan of the abdomen without any acute findings. Continue Prilosec. GI recommended holding aspirin for 2 weeks.  ESRD on HD Tuesday, Thursday Saturday- Nephology on board for hemodialysis. Currently receiving hemodialysis. Next dialysis on 5/12.  Essential hypertension-continue Imdur, labetalol minoxidil.   Blood pressures are fairly stable.  Dyslipidemia-resume simvastatin.  CAD-chest pain-free.  Resume aspirin in 2 weeks as recommended by GI. On a statin.  Debility, weakness. Patient was seen by physical therapy and recommended skilled nursing facility placement.  Discharge tomorrow.    DVT prophylaxis: SCDs Start: 03/18/21 1207   Code Status: Full code Family Communication: None at the bedside Disposition Plan: Status is: Inpatient  Remains inpatient appropriate because:Inpatient level of care appropriate due to severity of illness   Dispo: The patient is from: Home              Anticipated d/c is to: SNF              Patient currently is not medically stable to d/c.   Difficult to place patient No   scheduled for procedure today.  She will need dialysis tomorrow morning.  Anticipate discharge to  skilled nursing facility tomorrow after dialysis.      Consultants:   Gastroenterology  Nephrology  Procedures:   EGD and colonoscopy  Antimicrobials:   Completed antibiotics   Subjective: Patient was  seen and examined before going to procedure.  She was denying any complaints.  Objective: Vitals:   03/29/21 1350 03/29/21 1354 03/29/21 1405 03/29/21 1410  BP: (!) 90/35 (!) 86/58 (!) 127/98 (!) 127/41  Pulse: 61 60 (!) 58 60  Resp: 13 12 14 18   Temp: (!) 97.5 F (36.4 C)     TempSrc: Temporal     SpO2: 100% 100% 100% 100%  Weight:      Height:        Intake/Output Summary (Last 24 hours) at 03/29/2021 1413 Last data filed at 03/29/2021 1355 Gross per 24 hour  Intake 270 ml  Output --  Net 270 ml   Filed Weights   03/25/21 1351 03/28/21 0715 03/28/21 1051  Weight: 54.4 kg 57.6 kg 56.6 kg    Examination:  General: Pt is alert, awake, not in acute distress Sitting in couch.  On room air. Cardiovascular: RRR, S1/S2 +, no rubs, no gallops Respiratory: CTA bilaterally, no wheezing, no rhonchi Abdominal: Soft, NT, ND, bowel sounds + Extremities: no edema, no cyanosis Right thighAV fistula with thrill.   Data Reviewed: I have personally reviewed following labs and imaging studies  CBC: Recent Labs  Lab 03/24/21 2230 03/25/21 0146 03/26/21 0050 03/27/21 1549 03/28/21 0213 03/28/21 0639 03/29/21 0145  WBC 9.5   < > 9.0 7.0 8.1 7.6 7.5  NEUTROABS 7.3  --   --   --   --   --   --   HGB 9.3*   < > 9.1* 8.7* 8.5* 8.0* 8.2*  HCT 28.7*   < > 28.0* 26.8* 26.4* 24.9* 25.5*  MCV 94.1   < > 92.7 94.0 94.0 94.0 94.1  PLT 188   < > 181 153 151 157 144*   < > = values in this interval not displayed.   Basic Metabolic Panel: Recent Labs  Lab 03/23/21 0548 03/25/21 1038 03/25/21 1415 03/28/21 0213 03/28/21 0639  NA 132* 136 135 127* 127*  K 3.5 2.9* 3.7 4.0 4.2  CL 98 103 94* 94* 94*  CO2 26 28  --  25 24  GLUCOSE 109* 88 73 102* 83  BUN 24* 10 8 23  24*  CREATININE 4.04* 2.47* 2.30* 4.61* 4.86*  CALCIUM 8.1* 8.0*  --  7.7* 7.7*  MG 2.0  --   --   --   --   PHOS 3.4 1.7*  --  3.8 3.8   GFR: Estimated Creatinine Clearance: 9.2 mL/min (A) (by C-G formula based on  SCr of 4.86 mg/dL (H)). Liver Function Tests: Recent Labs  Lab 03/25/21 1038 03/28/21 0639 03/29/21 0145  AST  --   --  19  ALT  --   --  11  ALKPHOS  --   --  77  BILITOT  --   --  0.9  PROT  --   --  6.2*  ALBUMIN 1.9* 2.0* 2.2*   No results for input(s): LIPASE, AMYLASE in the last 168 hours. No results for input(s): AMMONIA in the last 168 hours. Coagulation Profile: No results for input(s): INR, PROTIME in the last 168 hours. Cardiac Enzymes: No results for input(s): CKTOTAL, CKMB, CKMBINDEX, TROPONINI in the last 168 hours. BNP (last 3 results) No results for input(s): PROBNP in the last 8760 hours.  HbA1C: No results for input(s): HGBA1C in the last 72 hours. CBG: No results for input(s): GLUCAP in the last 168 hours. Lipid Profile: No results for input(s): CHOL, HDL, LDLCALC, TRIG, CHOLHDL, LDLDIRECT in the last 72 hours. Thyroid Function Tests: No results for input(s): TSH, T4TOTAL, FREET4, T3FREE, THYROIDAB in the last 72 hours. Anemia Panel: Recent Labs    03/28/21 0640  FERRITIN 493*  TIBC NOT CALCULATED  IRON 60   Sepsis Labs: No results for input(s): PROCALCITON, LATICACIDVEN in the last 168 hours.  Recent Results (from the past 240 hour(s))  Resp Panel by RT-PCR (Flu A&B, Covid) Nasopharyngeal Swab     Status: None   Collection Time: 03/27/21  3:39 PM   Specimen: Nasopharyngeal Swab; Nasopharyngeal(NP) swabs in vial transport medium  Result Value Ref Range Status   SARS Coronavirus 2 by RT PCR NEGATIVE NEGATIVE Final    Comment: (NOTE) SARS-CoV-2 target nucleic acids are NOT DETECTED.  The SARS-CoV-2 RNA is generally detectable in upper respiratory specimens during the acute phase of infection. The lowest concentration of SARS-CoV-2 viral copies this assay can detect is 138 copies/mL. A negative result does not preclude SARS-Cov-2 infection and should not be used as the sole basis for treatment or other patient management decisions. A negative  result may occur with  improper specimen collection/handling, submission of specimen other than nasopharyngeal swab, presence of viral mutation(s) within the areas targeted by this assay, and inadequate number of viral copies(<138 copies/mL). A negative result must be combined with clinical observations, patient history, and epidemiological information. The expected result is Negative.  Fact Sheet for Patients:  EntrepreneurPulse.com.au  Fact Sheet for Healthcare Providers:  IncredibleEmployment.be  This test is no t yet approved or cleared by the [place] FDA and  has been authorized for detection and/or diagnosis of SARS-CoV-2 by FDA under an Emergency Use Authorization (EUA). This EUA will remain  in effect (meaning this test can be used) for the duration of the COVID-19 declaration under Section 564(b)(1) of the Act, 21 U.S.C.section 360bbb-3(b)(1), unless the authorization is terminated  or revoked sooner.       Influenza A by PCR NEGATIVE NEGATIVE Final   Influenza B by PCR NEGATIVE NEGATIVE Final    Comment: (NOTE) The Xpert Xpress SARS-CoV-2/FLU/RSV plus assay is intended as an aid in the diagnosis of influenza from Nasopharyngeal swab specimens and should not be used as a sole basis for treatment. Nasal washings and aspirates are unacceptable for Xpert Xpress SARS-CoV-2/FLU/RSV testing.  Fact Sheet for Patients: EntrepreneurPulse.com.au  Fact Sheet for Healthcare Providers: IncredibleEmployment.be  This test is not yet approved or cleared by the [place] FDA and has been authorized for detection and/or diagnosis of SARS-CoV-2 by FDA under an Emergency Use Authorization (EUA). This EUA will remain in effect (meaning this test can be used) for the duration of the COVID-19 declaration under Section 564(b)(1) of the Act, 21 U.S.C. section 360bbb-3(b)(1), unless the authorization is  terminated or revoked.  Performed at [facility], [place] [address], [place], [place] [address]          Radiology Studies: No results found.      Scheduled Meds: . [MAR Hold] Chlorhexidine Gluconate Cloth  6 each Topical Q0600  . [MAR Hold] Chlorhexidine Gluconate Cloth  6 each Topical Q0600  . [MAR Hold] Chlorhexidine Gluconate Cloth  6 each Topical Q0600  . [MAR Hold] cinacalcet  30 mg Oral QPC supper  . [MAR Hold] cycloSPORINE  1 drop Both  Eyes BID  . [MAR Hold] darbepoetin (ARANESP) injection - DIALYSIS  100 mcg Intravenous Q Tue-HD  . [MAR Hold] docusate sodium  100 mg Oral Daily  . [MAR Hold] isosorbide mononitrate  30 mg Oral Daily  . [MAR Hold] labetalol  100 mg Oral BID  . [MAR Hold] latanoprost  1 drop Both Eyes QHS  . [MAR Hold] minoxidil  2.5 mg Oral BID  . [MAR Hold] pantoprazole  40 mg Oral BID AC  . [MAR Hold] sodium chloride flush  3 mL Intravenous Q12H   Continuous Infusions: . [MAR Hold] sodium chloride    . [MAR Hold] sodium chloride    . sodium chloride       LOS: 11 days    Time spent: 32 minutes    Barb Merino, MD [facility] Pager [phone]

## 2021-03-29 NOTE — Anesthesia Preprocedure Evaluation (Signed)
Anesthesia Evaluation  Patient identified by MRN, date of birth, ID band Patient awake    Reviewed: Allergy & Precautions, NPO status , Patient's Chart, lab work & pertinent test results  Airway Mallampati: II  TM Distance: >3 FB Neck ROM: Full    Dental  (+) Teeth Intact, Dental Advisory Given   Pulmonary neg pulmonary ROS,    breath sounds clear to auscultation       Cardiovascular hypertension, Pt. on home beta blockers + CAD  + Valvular Problems/Murmurs MR and AI  Rhythm:Regular Rate:Normal     Neuro/Psych negative neurological ROS  negative psych ROS   GI/Hepatic Neg liver ROS, GERD  Medicated,  Endo/Other  negative endocrine ROS  Renal/GU Renal disease     Musculoskeletal  (+) Arthritis ,   Abdominal Normal abdominal exam  (+)   Peds  Hematology negative hematology ROS (+)   Anesthesia Other Findings   Reproductive/Obstetrics                             Anesthesia Physical  Anesthesia Plan  ASA: III  Anesthesia Plan: MAC   Post-op Pain Management:    Induction: Intravenous  PONV Risk Score and Plan: 0 and Propofol infusion  Airway Management Planned: Natural Airway and Nasal Cannula  Additional Equipment: None  Intra-op Plan:   Post-operative Plan:   Informed Consent: I have reviewed the patients History and Physical, chart, labs and discussed the procedure including the risks, benefits and alternatives for the proposed anesthesia with the patient or authorized representative who has indicated his/her understanding and acceptance.       Plan Discussed with: CRNA and Anesthesiologist  Anesthesia Plan Comments:         Anesthesia Quick Evaluation

## 2021-03-29 NOTE — Discharge Summary (Signed)
Physician Discharge Summary  [name] IZT:[id number] DOB: 1951-03-18 DOA: 03/17/2021  PCP: Glenda Chroman, MD  Admit date: 03/17/2021 Discharge date: 03/30/2021  Admitted From: Home Disposition: Skilled nursing facility  Recommendations for Outpatient Follow-up:  1. Follow up with PCP in 1-2 weeks 2. Please obtain BMP/CBC in one week 3. Continue dialysis TTS schedule  Home Health: Not applicable Equipment/Devices: Not applicable  Discharge Condition: Stable CODE STATUS: Full code Diet recommendation: Low-salt diet  Discharge summary: [name] a 70 y.o.femalewith medical history significant forESRD on HD Tuesday Thursday Saturday, essential hypertension, dyslipidemia, chronic anemia and GERD presented to [facility] from [facility] with worsening abdominal distention and pain with black tarry stools for 2 weeks with impaired appetite as well.  In the ED patient had elevated white count at 20.3 . She was given vancomycin and Zosyn initially.  CT scan of the abdomen pelvis showed some large collection of fluid and gas and debris's consistent with mesenteric abscess likely related to small bowel perforation.  Patient was then admitted to hospital for further evaluation and treatment. Subsequently, IR was consulted for possible IR guided drain but there was no drainable collection noted.  Due to black tarry stools, GI consultation was made.  GI recommended conservative treatment due to duodenal diverticulum.  Patient did have a drop in hemoglobin and was transfused 1 unit of packed RBC. GI was reconsulted and underwent EGD on 03/25/2021 with normal findings.  Patient underwent colonoscopy 5/11, she was found to have left-sided diverticulosis, internal hemorrhoids, 7 mm polyp that was removed.  Remained stable after procedure.  Assessment/plan of care:  Suspected mesenteric abscess with possible associated bowel perforation: Ruled out. CT scan from 03/19/21 without enhancing  collection but possible finding of fluid density consistent with patulous diverticulum.  CT scan of the abdomen was repeated again on 03/26/2021 as per GI recommendation.  There was no acute findings in the abdomen at this time to suggest any abscess or diverticulitis. Patient was treated with broad-spectrum antibiotics.  No evidence of ongoing fever.  She is off antibiotics.  Abdomen pain improved.  GI bleed with Anemia of chronic renal disease Baseline hemoglobin 9.1.   Received 1 unit of PRBC on 5/6.   EGD essentially normal.   Colonoscopy 5/11 with no significant source of bleeding. Repeat CT scan of the abdomen without any acute findings.   Continue Prilosec. Hemoglobin dropped and then since remained stable.  ESRD on HD Tuesday, Thursday Saturday- Nephology on board for hemodialysis.  Currently receiving hemodialysis. Next dialysis today before discharge.  Essential hypertension-continue Imdur, labetalol minoxidil.    Blood pressures are fairly stable.  Dyslipidemia- resume simvastatin.  CAD-chest pain-free.  Resume aspirin in 2 weeks as recommended by GI. On statin.  Debility, weakness.  Patient was seen by physical therapy and recommended skilled nursing facility placement.   Stable to discharge at a skilled level of care today after dialysis.    Discharge Diagnoses:  Principal Problem:   Mesenteric abscess ([place]) Active Problems:   Hyperlipidemia   Mitral valve regurgitation   Essential hypertension   Coronary artery disease   SYSTOLIC HEART FAILURE, CHRONIC   ESRD on dialysis ([place])   Perforation bowel (HCC)   Melena   Acute blood loss anemia   Generalized abdominal pain   Hiatal hernia   Abnormal CT scan, small bowel   Benign neoplasm of descending colon    Discharge Instructions  Discharge Instructions    Diet - low sodium heart healthy  Complete by: As directed    Increase activity slowly   Complete by: As directed      Allergies as of  03/30/2021      Reactions   Dilaudid [hydromorphone] Nausea And Vomiting   Dilaudid  [hydromorphone Hcl]    Tape Rash   Plastic tape      Medication List    STOP taking these medications   traMADol 50 MG tablet Commonly known as: ULTRAM     TAKE these medications   acetaminophen 500 MG tablet Commonly known as: TYLENOL Take 1,000 mg by mouth every 6 (six) hours as needed for pain (headache).   aspirin 81 MG tablet Take 1 tablet (81 mg total) by mouth daily. Start taking on: Apr 12, 2021 What changed: These instructions start on Apr 12, 2021. If you are unsure what to do until then, ask your doctor or other care provider.   cinacalcet 30 MG tablet Commonly known as: SENSIPAR Take 30 mg by mouth daily after supper.   cycloSPORINE 0.05 % ophthalmic emulsion Commonly known as: RESTASIS Place 1 drop into both eyes 2 (two) times daily.   dicyclomine 10 MG capsule Commonly known as: BENTYL Take 1 capsule (10 mg total) by mouth 3 (three) times daily as needed for spasms (cramping).   docusate sodium 100 MG capsule Commonly known as: COLACE Take 100 mg by mouth daily.   isosorbide mononitrate 30 MG 24 hr tablet Commonly known as: IMDUR Take 30 mg by mouth daily.   labetalol 200 MG tablet Commonly known as: NORMODYNE Take 100 mg by mouth 2 (two) times daily.   latanoprost 0.005 % ophthalmic solution Commonly known as: XALATAN Place 1 drop into both eyes at bedtime.   minoxidil 2.5 MG tablet Commonly known as: LONITEN Take 2.5 mg by mouth 2 (two) times daily.   NEPHPLEX RX PO Take 1 tablet by mouth daily.   omeprazole 40 MG capsule Commonly known as: PRILOSEC Take 40 mg by mouth daily.   Renal 1 MG Caps Take 1 mg by mouth daily.   sevelamer carbonate 800 MG tablet Commonly known as: RENVELA Take 800-2,400 mg by mouth 3 (three) times daily with meals. 800 mg with snacks. 2,400 mg with meals   simvastatin 20 MG tablet Commonly known as: ZOCOR Take 20 mg by  mouth daily.       Follow-up Information    Vyas, Dhruv B, MD Follow up in 2 week(s).   Specialty: Internal Medicine Contact information: [place] [address] [phone]              Allergies  Allergen Reactions  . Dilaudid [Hydromorphone] Nausea And Vomiting  . Dilaudid  [Hydromorphone Hcl]   . Tape Rash    Plastic tape    Consultations:  Gastroenterology  Interventional radiology   Procedures/Studies: CT ABDOMEN PELVIS W CONTRAST  Result Date: 03/26/2021 CLINICAL DATA:  Abdominal pain. EXAM: CT ABDOMEN AND PELVIS WITH CONTRAST TECHNIQUE: Multidetector CT imaging of the abdomen and pelvis was performed using the standard protocol following bolus administration of intravenous contrast. CONTRAST:  81mL OMNIPAQUE IOHEXOL 350 MG/ML SOLN COMPARISON:  03/19/2021. FINDINGS: Lower chest: Small bilateral effusions, increased in size from the prior CT. Dependent lower lobe opacity consistent with atelectasis. Mild enlargement of the heart. Hepatobiliary: Liver normal in size. Several low-density liver lesions are noted, stable consistent with cysts. No other liver abnormality. Gallbladder is distended. No visualized stones. Mild intra and extrahepatic bile duct dilation, common bile duct  1 cm in diameter, stable from the prior CT. Pancreas: Unremarkable. No pancreatic ductal dilatation or surrounding inflammatory changes. Spleen: Subcentimeter low-density lesion along the medial margin of the spleen stable consistent with a cyst. Spleen otherwise unremarkable. Adrenals/Urinary Tract: No adrenal masses. Enlarged hypoattenuating kidneys with multiple cysts and calcifications. Left intrarenal collecting system is dilated with increased attenuation extending from the calices through the dilated tortuous ureter to the level of avascular clip adjacent to the psoas muscle at the level of the pelvic brim. These findings are stable. Bladder is decompressed. Small calcified transplant  kidney in the left pelvis. Stomach/Bowel: The collection noted in the anterior mid to upper pelvis on the CT dated 03/18/2021 is no longer appreciated. The smaller questionable diverticulum noted in this location on the more recent CT is also no longer visualized. There are numerous colonic diverticula with no current evidence of diverticulitis. Colon and small bowel are normal in caliber. No wall thickening. No convincing mesenteric inflammation. Stomach is unremarkable. Vascular/Lymphatic: Aortic atherosclerosis. No aneurysm. Right iliac vein stent. Right inguinal region dialysis fistula, incompletely imaged, unchanged from the prior CT. Reproductive: Calcified fibroids.  No adnexal masses. Other: Ascites and diffuse peritoneal and subcutaneous fat edema. Musculoskeletal: No fracture or acute finding. Marked loss of disc height with endplate irregularity at L4-L5. Diffuse chalky appearance the bones consistent with renal osteodystrophy. Partly imaged than wall fluid collection lies posteriorly and lateral to the greater trochanter of the left proximal femur in the subcutaneous fat. This may reflect an old hematoma, but is nonspecific. Findings stable compared to the prior CT. IMPRESSION: 1. Previously seen pelvic collection, noted on the CT dated 03/18/2021, is no longer visualized. The smaller fluid density structure noted in this location on the more recent prior CT is also no longer visualized. There is currently no evidence of a mesenteric/peritoneal abscess, and no evidence of diverticulitis or other bowel inflammatory process or of epiploic appendagitis. Numerous colonic and small bowel diverticula. 2. No free air. 3. Small pleural effusions and small amount of ascites, both increased compared to the prior CT. 4. Gallbladder remains distended, but with no wall thickening or convincing inflammation. Stable intra and extrahepatic bile duct dilation. 5. Multi-cystic enlarged kidneys are stable. Dilated left  intrarenal collecting system and ureter extending to a retroperitoneal vascular clip, also unchanged. 6. Diffuse peritoneal and subcutaneous soft tissue edema without change from the prior CT. 7. Partly imaged thin walled homogeneous fluid collection in the subcutaneous fat adjacent to the left hip, probably unchanged from the prior CT. Electronically Signed   By: Lajean Manes M.D.   On: 03/26/2021 14:16   CT ABDOMEN PELVIS W CONTRAST  Result Date: 03/19/2021 CLINICAL DATA:  Suspected abdominal abscess. EXAM: CT ABDOMEN AND PELVIS WITH CONTRAST TECHNIQUE: Multidetector CT imaging of the abdomen and pelvis was performed using the standard protocol following bolus administration of intravenous contrast. CONTRAST:  47mL OMNIPAQUE IOHEXOL 350 MG/ML SOLN COMPARISON:  None. FINDINGS: Lower chest: Basilar atelectasis bilaterally with small bilateral pleural effusions. Hepatobiliary: Small hepatic cysts with periportal edema and intrahepatic biliary duct dilatation, similar to prior. Gallbladder is massively distended as before. Common bile duct measures up to 12 mm diameter, stable. Pancreas: No focal mass lesion. No dilatation of the main duct. No intraparenchymal cyst. No peripancreatic edema. Spleen: Tiny hypodensity in the medial spleen is similar to prior. Adrenals/Urinary Tract: Stable appearance of the multicystic kidneys bilaterally with dilated left intrarenal collecting system and proximal left ureter down to the level of the previously  described retroperitoneal clip. Bladder is nondistended. Transplant kidney noted left pelvis. Stomach/Bowel: Stomach is decompressed. Large duodenal diverticulum noted small bowel is clustered in the anterior abdomen due to the renal enlargement and lack of intra-abdominal fat. The 5.0 x 4.3 cm rim enhancing collection of gas and debris seen previously is not evident on the current study although there is a small 2 x 1.2 cm fluid density structure at this location on image  55/3. Multiple additional round collections of gas and debris are again noted in the mesentery, clearly seen to represent small bowel diverticuli on today's study, 1 of which has become opacified (axial 57/3 and coronal 30/6). Colon is nondistended with features suggesting diffuse diverticular disease. Vascular/Lymphatic: There is abdominal aortic atherosclerosis without aneurysm. Right external iliac vein stent again noted. There is no gastrohepatic or hepatoduodenal ligament lymphadenopathy. No retroperitoneal or mesenteric lymphadenopathy. No pelvic sidewall lymphadenopathy. Right groin dialysis fistula has been incompletely visualized. Reproductive: Calcified fibroids.  There is no adnexal mass. Other: Diffuse body wall and mesenteric edema. Musculoskeletal: No worrisome lytic or sclerotic osseous abnormality. IMPRESSION: 1. The 5.0 x 4.3 cm rim enhancing collection of gas and debris seen previously is not evident on the current study although there is a small 2 x 1.2 cm fluid density structure at this location. Presumably this was a large irregular patulous diverticulum that has decompressed in the interval or dilated irregular small bowel loop. 2. Multiple additional round collections of gas and debris are again noted in the mesentery, clearly seen to represent small bowel diverticuli on today's study. 3. Massive distention of the gallbladder with periportal edema and biliary duct dilatation, similar to prior. 4. Stable appearance of the multicystic kidneys bilaterally with dilated left intrarenal collecting system and proximal left ureter down to the level of the previously described retroperitoneal clip. 5. Diffuse body wall and mesenteric edema with small bilateral pleural effusions. 6. Aortic Atherosclerosis (ICD10-I70.0). Electronically Signed   By: Misty Stanley M.D.   On: 03/19/2021 14:59   CT ABDOMEN PELVIS W CONTRAST  Result Date: 03/18/2021 CLINICAL DATA:  Bloody stools with abdominal pain. EXAM:  CT ABDOMEN AND PELVIS WITH CONTRAST TECHNIQUE: Multidetector CT imaging of the abdomen and pelvis was performed using the standard protocol following bolus administration of intravenous contrast. CONTRAST:  137mL OMNIPAQUE IOHEXOL 300 MG/ML  SOLN COMPARISON:  02/20/2021. FINDINGS: Lower chest: Heart is enlarged. Dependent atelectasis noted in the lung bases. Hepatobiliary: Tiny hypodensities in the right liver are stable since prior and also comparing back to 05/09/2020 suggesting benign etiology. Mild intra and extrahepatic biliary duct dilatation again noted with some associated periportal edema today. Common bile duct measures 12 mm diameter, increased from 8 mm on the 05/09/2020 exam. Gallbladder is markedly distended. Pancreas: Mild prominence of the ventral pancreatic duct. Dorsal duct is not well seen in the head of the pancreas but appears nondilated in the body and tail. No discrete pancreatic mass lesion evident. Spleen: No splenomegaly. No focal mass lesion. Adrenals/Urinary Tract: Marked bilateral polycystic kidney disease noted with numerous calcifications/stones, similar to prior. As noted previously, there is fairly marked left-sided hydroureteronephrosis with collecting system and proximal ureter filled with high attenuation material. Left ureter remains dilated down to the level of the iliac crests to a point immediately adjacent to a surgical clip. Bladder is decompressed. Stomach/Bowel: Stomach is decompressed. No small bowel dilatation. Assessment of bowel limited by lack of mesenteric fat. A 5.0 x 4.3 x 3.1 cm collection of gas and debris is identified in  the anterior central mesentery of the pelvis (see axial image 60/series 2 and sagittal image 72 of series 6). This shows an irregular rim of peripheral enhancement different than enhancement seen in bowel wall. Imaging features are very suspicious for mesenteric abscess, also visible on coronal image 47 where there may be a second smaller  adjacent component visible on 46/5. This finding is not immediately adjacent to the left colon and etiology is uncertain although small bowel perforation would be a consideration. Several other small circular areas of gas and debris are seen in the lower small bowel mesentery, indeterminate. Vascular/Lymphatic: There is abdominal aortic atherosclerosis without aneurysm. Right external iliac vein stent device noted. Pseudoaneurysm noted in the arterial limb laterally with a second relatively large pseudoaneurysm near the venous and of the graft more medially. There is no gastrohepatic or hepatoduodenal ligament lymphadenopathy. No retroperitoneal or mesenteric lymphadenopathy. No pelvic sidewall lymphadenopathy. Reproductive: Calcified fibroids are noted in the uterus There is no adnexal mass. Other: Small to moderate volume free fluid noted with diffuse body wall edema. Musculoskeletal: No worrisome lytic or sclerotic osseous abnormality. Advanced degenerative changes noted at L4-5 level. IMPRESSION: 1. 5.0 x 4.3 x 3.1 cm collection of gas and debris in the anterior central mesentery of the pelvis with an irregular rim of peripheral enhancement different than enhancement seen in bowel wall elsewhere. Although assessment is hindered by lack of mesenteric fat and bowel opacification, imaging features are very suspicious for mesenteric abscess. This finding is not immediately adjacent to the left colon and etiology is uncertain although small bowel perforation would be a consideration. 2. Several other small circular areas of gas and debris are seen in the lower small bowel mesentery, indeterminate. 3. Marked bilateral polycystic kidney disease with numerous calcifications/stones, similar to prior. There is fairly marked left hydroureteronephrosis with high attenuation material in the collecting system and proximal ureter, similar to prior. This may represent blood, infectious debris, or soft tissue/neoplasm. Ureteral  obstruction is in the mid ureter at or immediately adjacent to a surgical clip. 4. Right groin dialysis graft with pseudoaneurysm formation. 5. Small to moderate volume free fluid with diffuse body wall edema. 6. Mild intra and extrahepatic biliary duct dilatation with some associated periportal edema. Common bile duct measures 12 mm diameter, increased from 8 mm on the 05/09/2020 exam. Correlation with liver function test may prove helpful. Gallbladder is markedly distended. 7. Aortic Atherosclerosis (ICD10-I70.0). Electronically Signed   By: Misty Stanley M.D.   On: 03/18/2021 10:42   US Abdomen Limited  Result Date: 03/18/2021 CLINICAL DATA:  Periumbilical pain for 6 weeks EXAM: ULTRASOUND ABDOMEN LIMITED RIGHT UPPER QUADRANT COMPARISON:  CT abdomen 05/09/2020, 02/20/2021, 03/19/2019 FINDINGS: Gallbladder: Distended gallbladder. No cholelithiasis. Tumefactive sludge within the gallbladder. In negative sonographic Murphy sign. Mild gallbladder wall thickening measuring up to 4 mm. Common bile duct: Diameter: 12 mm.  No choledocholithiasis. Liver: No focal lesion identified. Increased hepatic parenchymal echogenicity. Portal vein is patent on color Doppler imaging with normal direction of blood flow towards the liver. Other: Polycystic right kidney. Small right pleural effusion. Small volume ascites. IMPRESSION: 1. Hydropic gallbladder with tumefactive sludge. No definite sonographic findings to suggest acute cholecystitis. 2. Dilated common bile duct of uncertain etiology. Electronically Signed   By: Kathreen Devoid   On: 03/18/2021 12:50   (Echo, Carotid, EGD, Colonoscopy, ERCP)    Subjective: Patient seen and examined in the morning rounds.  Denies any abdominal pain nausea or vomiting.     Discharge Exam: Vitals:  03/30/21 1000 03/30/21 1030  BP: (!) 114/39 (!) 121/47  Pulse:    Resp:    Temp:    SpO2:     Vitals:   03/30/21 0915 03/30/21 0930 03/30/21 1000 03/30/21 1030  BP: (!) 117/45 (!)  113/42 (!) 114/39 (!) 121/47  Pulse:      Resp:      Temp:      TempSrc:      SpO2:      Weight:      Height:        General: Pt is alert, awake, not in acute distress, sitting in chair.  On room air. Cardiovascular: RRR, S1/S2 +, no rubs, no gallops Respiratory: CTA bilaterally, no wheezing, no rhonchi Abdominal: Soft, NT, ND, bowel sounds + Extremities: no edema, no cyanosis Right upper extremity with old AV malformation. Right thigh AV fistula with thrill.    The results of significant diagnostics from this hospitalization (including imaging, microbiology, ancillary and laboratory) are listed below for reference.     Microbiology: Recent Results (from the past 240 hour(s))  Resp Panel by RT-PCR (Flu A&B, Covid) Nasopharyngeal Swab     Status: None   Collection Time: 03/27/21  3:39 PM   Specimen: Nasopharyngeal Swab; Nasopharyngeal(NP) swabs in vial transport medium  Result Value Ref Range Status   SARS Coronavirus 2 by RT PCR NEGATIVE NEGATIVE Final    Comment: (NOTE) SARS-CoV-2 target nucleic acids are NOT DETECTED.  The SARS-CoV-2 RNA is generally detectable in upper respiratory specimens during the acute phase of infection. The lowest concentration of SARS-CoV-2 viral copies this assay can detect is 138 copies/mL. A negative result does not preclude SARS-Cov-2 infection and should not be used as the sole basis for treatment or other patient management decisions. A negative result may occur with  improper specimen collection/handling, submission of specimen other than nasopharyngeal swab, presence of viral mutation(s) within the areas targeted by this assay, and inadequate number of viral copies(<138 copies/mL). A negative result must be combined with clinical observations, patient history, and epidemiological information. The expected result is Negative.  Fact Sheet for Patients:  EntrepreneurPulse.com.au  Fact Sheet for Healthcare Providers:   IncredibleEmployment.be  This test is no t yet approved or cleared by the [place] FDA and  has been authorized for detection and/or diagnosis of SARS-CoV-2 by FDA under an Emergency Use Authorization (EUA). This EUA will remain  in effect (meaning this test can be used) for the duration of the COVID-19 declaration under Section 564(b)(1) of the Act, 21 U.S.C.section 360bbb-3(b)(1), unless the authorization is terminated  or revoked sooner.       Influenza A by PCR NEGATIVE NEGATIVE Final   Influenza B by PCR NEGATIVE NEGATIVE Final    Comment: (NOTE) The Xpert Xpress SARS-CoV-2/FLU/RSV plus assay is intended as an aid in the diagnosis of influenza from Nasopharyngeal swab specimens and should not be used as a sole basis for treatment. Nasal washings and aspirates are unacceptable for Xpert Xpress SARS-CoV-2/FLU/RSV testing.  Fact Sheet for Patients: EntrepreneurPulse.com.au  Fact Sheet for Healthcare Providers: IncredibleEmployment.be  This test is not yet approved or cleared by the [place] FDA and has been authorized for detection and/or diagnosis of SARS-CoV-2 by FDA under an Emergency Use Authorization (EUA). This EUA will remain in effect (meaning this test can be used) for the duration of the COVID-19 declaration under Section 564(b)(1) of the Act, 21 U.S.C. section 360bbb-3(b)(1), unless the authorization is terminated or revoked.  Performed at [facility]  [facility], [address], [place], [place] [address]   SARS CORONAVIRUS 2 (TAT 6-24 HRS) Nasopharyngeal Nasopharyngeal Swab     Status: None   Collection Time: 03/29/21  6:58 PM   Specimen: Nasopharyngeal Swab  Result Value Ref Range Status   SARS Coronavirus 2 NEGATIVE NEGATIVE Final    Comment: (NOTE) SARS-CoV-2 target nucleic acids are NOT DETECTED.  The SARS-CoV-2 RNA is generally detectable in upper and lower respiratory specimens during the  acute phase of infection. Negative results do not preclude SARS-CoV-2 infection, do not rule out co-infections with other pathogens, and should not be used as the sole basis for treatment or other patient management decisions. Negative results must be combined with clinical observations, patient history, and epidemiological information. The expected result is Negative.  Fact Sheet for Patients: SugarRoll.be  Fact Sheet for Healthcare Providers: https://www.woods-mathews.com/  This test is not yet approved or cleared by the [place] FDA and  has been authorized for detection and/or diagnosis of SARS-CoV-2 by FDA under an Emergency Use Authorization (EUA). This EUA will remain  in effect (meaning this test can be used) for the duration of the COVID-19 declaration under Se ction 564(b)(1) of the Act, 21 U.S.C. section 360bbb-3(b)(1), unless the authorization is terminated or revoked sooner.  Performed at [facility], [place] [address] [place], [place] [address]      Labs: BNP (last 3 results) No results for input(s): BNP in the last 8760 hours. Basic Metabolic Panel: Recent Labs  Lab 03/25/21 1038 03/25/21 1415 03/28/21 0213 03/28/21 0639 03/30/21 0914  NA 136 135 127* 127* 128*  K 2.9* 3.7 4.0 4.2 4.2  CL 103 94* 94* 94* 98  CO2 28  --  25 24 22   GLUCOSE 88 73 102* 83 98  BUN 10 8 23  24* 16  CREATININE 2.47* 2.30* 4.61* 4.86* 4.37*  CALCIUM 8.0*  --  7.7* 7.7* 7.3*  PHOS 1.7*  --  3.8 3.8 2.6   Liver Function Tests: Recent Labs  Lab 03/25/21 1038 03/28/21 0639 03/29/21 0145 03/30/21 0914  AST  --   --  19  --   ALT  --   --  11  --   ALKPHOS  --   --  77  --   BILITOT  --   --  0.9  --   PROT  --   --  6.2*  --   ALBUMIN 1.9* 2.0* 2.2* 2.1*   No results for input(s): LIPASE, AMYLASE in the last 168 hours. No results for input(s): AMMONIA in the last 168 hours. CBC: Recent Labs  Lab 03/24/21 2230  03/25/21 0146 03/27/21 1549 03/28/21 0213 03/28/21 0639 03/29/21 0145 03/30/21 0914  WBC 9.5   < > 7.0 8.1 7.6 7.5 7.3  NEUTROABS 7.3  --   --   --   --   --   --   HGB 9.3*   < > 8.7* 8.5* 8.0* 8.2* 7.8*  HCT 28.7*   < > 26.8* 26.4* 24.9* 25.5* 23.9*  MCV 94.1   < > 94.0 94.0 94.0 94.1 94.5  PLT 188   < > 153 151 157 144* 133*   < > = values in this interval not displayed.   Cardiac Enzymes: No results for input(s): CKTOTAL, CKMB, CKMBINDEX, TROPONINI in the last 168 hours. BNP: Invalid input(s): POCBNP CBG: No results for input(s): GLUCAP in the last 168 hours. D-Dimer No results for input(s): DDIMER in the last 72  hours. Hgb A1c No results for input(s): HGBA1C in the last 72 hours. Lipid Profile No results for input(s): CHOL, HDL, LDLCALC, TRIG, CHOLHDL, LDLDIRECT in the last 72 hours. Thyroid function studies No results for input(s): TSH, T4TOTAL, T3FREE, THYROIDAB in the last 72 hours.  Invalid input(s): FREET3 Anemia work up [employer]    03/28/21 0640  FERRITIN 493*  TIBC NOT CALCULATED  IRON 60   Urinalysis No results found for: COLORURINE, APPEARANCEUR, LABSPEC, [place], GLUCOSEU, [place], [place], KETONESUR, PROTEINUR, UROBILINOGEN, NITRITE, LEUKOCYTESUR Sepsis Labs Invalid input(s): PROCALCITONIN,  WBC,  LACTICIDVEN Microbiology Recent Results (from the past 240 hour(s))  Resp Panel by RT-PCR (Flu A&B, Covid) Nasopharyngeal Swab     Status: None   Collection Time: 03/27/21  3:39 PM   Specimen: Nasopharyngeal Swab; Nasopharyngeal(NP) swabs in vial transport medium  Result Value Ref Range Status   SARS Coronavirus 2 by RT PCR NEGATIVE NEGATIVE Final    Comment: (NOTE) SARS-CoV-2 target nucleic acids are NOT DETECTED.  The SARS-CoV-2 RNA is generally detectable in upper respiratory specimens during the acute phase of infection. The lowest concentration of SARS-CoV-2 viral copies this assay can detect is 138 copies/mL. A negative result does not  preclude SARS-Cov-2 infection and should not be used as the sole basis for treatment or other patient management decisions. A negative result may occur with  improper specimen collection/handling, submission of specimen other than nasopharyngeal swab, presence of viral mutation(s) within the areas targeted by this assay, and inadequate number of viral copies(<138 copies/mL). A negative result must be combined with clinical observations, patient history, and epidemiological information. The expected result is Negative.  Fact Sheet for Patients:  EntrepreneurPulse.com.au  Fact Sheet for Healthcare Providers:  IncredibleEmployment.be  This test is no t yet approved or cleared by the [place] FDA and  has been authorized for detection and/or diagnosis of SARS-CoV-2 by FDA under an Emergency Use Authorization (EUA). This EUA will remain  in effect (meaning this test can be used) for the duration of the COVID-19 declaration under Section 564(b)(1) of the Act, 21 U.S.C.section 360bbb-3(b)(1), unless the authorization is terminated  or revoked sooner.       Influenza A by PCR NEGATIVE NEGATIVE Final   Influenza B by PCR NEGATIVE NEGATIVE Final    Comment: (NOTE) The Xpert Xpress SARS-CoV-2/FLU/RSV plus assay is intended as an aid in the diagnosis of influenza from Nasopharyngeal swab specimens and should not be used as a sole basis for treatment. Nasal washings and aspirates are unacceptable for Xpert Xpress SARS-CoV-2/FLU/RSV testing.  Fact Sheet for Patients: EntrepreneurPulse.com.au  Fact Sheet for Healthcare Providers: IncredibleEmployment.be  This test is not yet approved or cleared by the [place] FDA and has been authorized for detection and/or diagnosis of SARS-CoV-2 by FDA under an Emergency Use Authorization (EUA). This EUA will remain in effect (meaning this test can be used) for the  duration of the COVID-19 declaration under Section 564(b)(1) of the Act, 21 U.S.C. section 360bbb-3(b)(1), unless the authorization is terminated or revoked.  Performed at [facility], [place] [address] [place], [place] [address]   SARS CORONAVIRUS 2 (TAT 6-24 HRS) Nasopharyngeal Nasopharyngeal Swab     Status: None   Collection Time: 03/29/21  6:58 PM   Specimen: Nasopharyngeal Swab  Result Value Ref Range Status   SARS Coronavirus 2 NEGATIVE NEGATIVE Final    Comment: (NOTE) SARS-CoV-2 target nucleic acids are NOT DETECTED.  The SARS-CoV-2 RNA is generally detectable in upper and lower respiratory  specimens during the acute phase of infection. Negative results do not preclude SARS-CoV-2 infection, do not rule out co-infections with other pathogens, and should not be used as the sole basis for treatment or other patient management decisions. Negative results must be combined with clinical observations, patient history, and epidemiological information. The expected result is Negative.  Fact Sheet for Patients: SugarRoll.be  Fact Sheet for Healthcare Providers: https://www.woods-mathews.com/  This test is not yet approved or cleared by the [place] FDA and  has been authorized for detection and/or diagnosis of SARS-CoV-2 by FDA under an Emergency Use Authorization (EUA). This EUA will remain  in effect (meaning this test can be used) for the duration of the COVID-19 declaration under Se ction 564(b)(1) of the Act, 21 U.S.C. section 360bbb-3(b)(1), unless the authorization is terminated or revoked sooner.  Performed at [facility], [place] [address] [place], [place] [address]      Time coordinating discharge:  32 minutes  SIGNED:   Barb Merino, MD  [facility] 03/30/2021, 10:57 AM

## 2021-03-30 ENCOUNTER — Encounter (HOSPITAL_COMMUNITY): Payer: Self-pay | Admitting: Gastroenterology

## 2021-03-30 LAB — CBC
HCT: 23.9 % — ABNORMAL LOW (ref 36.0–46.0)
Hemoglobin: 7.8 g/dL — ABNORMAL LOW (ref 12.0–15.0)
MCH: 30.8 pg (ref 26.0–34.0)
MCHC: 32.6 g/dL (ref 30.0–36.0)
MCV: 94.5 fL (ref 80.0–100.0)
Platelets: 133 10*3/uL — ABNORMAL LOW (ref 150–400)
RBC: 2.53 MIL/uL — ABNORMAL LOW (ref 3.87–5.11)
RDW: 15.1 % (ref 11.5–15.5)
WBC: 7.3 10*3/uL (ref 4.0–10.5)
nRBC: 0 % (ref 0.0–0.2)

## 2021-03-30 LAB — RENAL FUNCTION PANEL
Albumin: 2.1 g/dL — ABNORMAL LOW (ref 3.5–5.0)
Anion gap: 8 (ref 5–15)
BUN: 16 mg/dL (ref 8–23)
CO2: 22 mmol/L (ref 22–32)
Calcium: 7.3 mg/dL — ABNORMAL LOW (ref 8.9–10.3)
Chloride: 98 mmol/L (ref 98–111)
Creatinine, Ser: 4.37 mg/dL — ABNORMAL HIGH (ref 0.44–1.00)
GFR, Estimated: 10 mL/min — ABNORMAL LOW (ref >60)
Glucose, Bld: 98 mg/dL (ref 70–99)
Phosphorus: 2.6 mg/dL (ref 2.5–4.6)
Potassium: 4.2 mmol/L (ref 3.5–5.1)
Sodium: 128 mmol/L — ABNORMAL LOW (ref 135–145)

## 2021-03-30 LAB — SURGICAL PATHOLOGY

## 2021-03-30 LAB — SARS CORONAVIRUS 2 (TAT 6-24 HRS): SARS Coronavirus 2: NEGATIVE

## 2021-03-30 MED ORDER — ASPIRIN 81 MG PO TABS: 81.0000 mg | ORAL_TABLET | Freq: Every day | ORAL | Status: DC

## 2021-03-30 MED ORDER — DICYCLOMINE HCL 10 MG PO CAPS
10.0000 mg | ORAL_CAPSULE | Freq: Three times a day (TID) | ORAL | Status: AC | PRN
Start: 2021-03-30 — End: ?

## 2021-03-30 NOTE — Progress Notes (Signed)
PT Cancellation Note  Patient Details Name: CALIE BUTTREY MRN: [id number] DOB: 02-Nov-1951   Cancelled Treatment:    Reason Eval/Treat Not Completed: Patient at procedure or test/unavailable (Pt off unit for HD with plan to d/c to snf this pm.  Will f/u per POC.)   Aranza Geddes Eli Hose 03/30/2021, 11:35 AM  Erasmo Leventhal , PTA Acute Rehabilitation Services Pager [phone] Office [phone]

## 2021-03-30 NOTE — Procedures (Signed)
Patient was seen on dialysis and the procedure was supervised.  BFR 400  Via AVG BP is  121/47.   Patient appears to be tolerating treatment well  Louis Meckel 03/30/2021

## 2021-03-30 NOTE — Progress Notes (Signed)
OT Cancellation Note  Patient Details Name: [name] MRN: [id number] DOB: 1951-05-10   Cancelled Treatment:    Reason Eval/Treat Not Completed: Patient declined, no reason specified;Other (comment). Pt eating lunch in bed, reporting she just returned from HD and was getting ready to discharge to SNF. Pt reporting she had no concerns or needs at this time. OT will follow up as time and schedule allow.   Tamyia Minich H., OTR/L Acute Rehabilitation  Archimedes Harold Elane Yolanda Bonine 03/30/2021, 2:16 PM

## 2021-03-30 NOTE — Progress Notes (Signed)
[name] NEPHROLOGY PROGRESS NOTE  Assessment/ Plan: 38F  with history of hypertension, HLD, anemia, acid reflux, ESRD on HD for 23 years, TTS schedule at [facility],  right thigh AVG, transferred from [facility] for mesenteric abscess and possible bowel perforation, seen as a consultation for the management of ESRD.  #Mesenteric abscess vs  More likey duodenal diverticulosis; per GI and CCS.  Now augmentin  # ESRD TTS at [facility]: On schedule. Volume status and electrolytes acceptable.  She has right thigh graft for the access.  No heparin.    HD today on schedule-  Can resume her regular HD as OP upon discharge  # Hypertension: Stable BPs, Minoxidil, labetalol -  Actually maybe a little too low but on very low doses   # Anemia of ESRD: Hb  down to 6.6 this admit; transfusion per prmary.  GI eval in process. Neg EGD 5/7. Darbe 100 given 5/10.  hgb really all over the place-  Currently 7.8  # Metabolic Bone Disease: P at goal. Ca ok.  On cinacalcet- no binders   Subjective:  -  Seen on HD-  Had colonoscopy yest- diverticulosis-  Says belly is still a little sore.  I think the plan is for d/c to SNF today.  Says her butt is sore-  Tried to reposition as best we could on HD  Objective Vital signs in last 24 hours: Vitals:   03/30/21 0915 03/30/21 0930 03/30/21 1000 03/30/21 1030  BP: (!) 117/45 (!) 113/42 (!) 114/39 (!) 121/47  Pulse:      Resp:      Temp:      TempSrc:      SpO2:      Weight:      Height:       Weight change:   Intake/Output Summary (Last 24 hours) at 03/30/2021 1058 Last data filed at 03/30/2021 0857 Gross per 24 hour  Intake 873 ml  Output --  Net 873 ml       Labs: Basic Metabolic Panel: Recent Labs  Lab 03/28/21 0213 03/28/21 0639 03/30/21 0914  NA 127* 127* 128*  K 4.0 4.2 4.2  CL 94* 94* 98  CO2 25 24 22   GLUCOSE 102* 83 98  BUN 23 24* 16  CREATININE 4.61* 4.86* 4.37*  CALCIUM 7.7* 7.7* 7.3*  PHOS 3.8  3.8 2.6   Liver Function Tests: Recent Labs  Lab 03/28/21 0639 03/29/21 0145 03/30/21 0914  AST  --  19  --   ALT  --  11  --   ALKPHOS  --  77  --   BILITOT  --  0.9  --   PROT  --  6.2*  --   ALBUMIN 2.0* 2.2* 2.1*   No results for input(s): LIPASE, AMYLASE in the last 168 hours. No results for input(s): AMMONIA in the last 168 hours. CBC: Recent Labs  Lab 03/24/21 2230 03/25/21 0146 03/27/21 1549 03/28/21 0213 03/28/21 0639 03/29/21 0145 03/30/21 0914  WBC 9.5   < > 7.0 8.1 7.6 7.5 7.3  NEUTROABS 7.3  --   --   --   --   --   --   HGB 9.3*   < > 8.7* 8.5* 8.0* 8.2* 7.8*  HCT 28.7*   < > 26.8* 26.4* 24.9* 25.5* 23.9*  MCV 94.1   < > 94.0 94.0 94.0 94.1 94.5  PLT 188   < > 153 151 157 144* 133*   < > =  values in this interval not displayed.   Cardiac Enzymes: No results for input(s): CKTOTAL, CKMB, CKMBINDEX, TROPONINI in the last 168 hours. CBG: No results for input(s): GLUCAP in the last 168 hours.  Iron Studies:  Recent Labs    03/28/21 0640  IRON 60  TIBC NOT CALCULATED  FERRITIN 493*   Studies/Results: No results found.  Medications: Infusions: . sodium chloride    . sodium chloride    . sodium chloride      Scheduled Medications: . Chlorhexidine Gluconate Cloth  6 each Topical Q0600  . Chlorhexidine Gluconate Cloth  6 each Topical Q0600  . Chlorhexidine Gluconate Cloth  6 each Topical Q0600  . cinacalcet  30 mg Oral QPC supper  . cycloSPORINE  1 drop Both Eyes BID  . darbepoetin (ARANESP) injection - DIALYSIS  100 mcg Intravenous Q Tue-HD  . docusate sodium  100 mg Oral Daily  . isosorbide mononitrate  30 mg Oral Daily  . labetalol  100 mg Oral BID  . latanoprost  1 drop Both Eyes QHS  . minoxidil  2.5 mg Oral BID  . pantoprazole  40 mg Oral BID AC  . sodium chloride flush  3 mL Intravenous Q12H    have reviewed scheduled and prn medications.  Physical Exam: General:NAD, comfortable Heart:RRR, s1s2 nl Lungs:clear b/l, no  crackle Abdomen:soft,  non-distended Extremities:No edema Dialysis Access: Right thigh AV graft has aneurysmal dilatation with good thrill and bruit.  Tahmir Kleckner A Nava Song 03/30/2021,10:58 AM  LOS: 12 days

## 2021-03-30 NOTE — Progress Notes (Signed)
Renal Navigator updated TOC CSW on when patient should be finished with HD today in order for him to arrange transport to SNF. Navigator faxed renal note and discharge summary to outpatient HD clinic/[facility] to provide continuity of care.   Alphonzo Cruise, [place] Renal Navigator [phone]

## 2021-03-30 NOTE — Plan of Care (Signed)
  Problem: Education: Goal: Knowledge of General Education information will improve Description: Including pain rating scale, medication(s)/side effects and non-pharmacologic comfort measures 03/30/2021 1446 by Elon Jester, RN Outcome: Completed/Met 03/30/2021 0857 by Elon Jester, RN Outcome: Progressing   Problem: Health Behavior/Discharge Planning: Goal: Ability to manage health-related needs will improve 03/30/2021 1446 by Elon Jester, RN Outcome: Completed/Met 03/30/2021 0857 by Elon Jester, RN Outcome: Progressing   Problem: Clinical Measurements: Goal: Ability to maintain clinical measurements within normal limits will improve 03/30/2021 1446 by Elon Jester, RN Outcome: Completed/Met 03/30/2021 0857 by Elon Jester, RN Outcome: Progressing Goal: Will remain free from infection 03/30/2021 1446 by Elon Jester, RN Outcome: Completed/Met 03/30/2021 0857 by Elon Jester, RN Outcome: Progressing Goal: Diagnostic test results will improve 03/30/2021 1446 by Elon Jester, RN Outcome: Completed/Met 03/30/2021 0857 by Elon Jester, RN Outcome: Progressing Goal: Respiratory complications will improve 03/30/2021 1446 by Elon Jester, RN Outcome: Completed/Met 03/30/2021 0857 by Elon Jester, RN Outcome: Progressing Goal: Cardiovascular complication will be avoided 03/30/2021 1446 by Elon Jester, RN Outcome: Completed/Met 03/30/2021 0857 by Elon Jester, RN Outcome: Progressing   Problem: Activity: Goal: Risk for activity intolerance will decrease 03/30/2021 1446 by Elon Jester, RN Outcome: Completed/Met 03/30/2021 0857 by Elon Jester, RN Outcome: Progressing   Problem: Nutrition: Goal: Adequate nutrition will be maintained 03/30/2021 1446 by Elon Jester, RN Outcome: Completed/Met 03/30/2021 0857 by Elon Jester, RN Outcome: Progressing   Problem: Coping: Goal: Level of anxiety will decrease 03/30/2021  1446 by Elon Jester, RN Outcome: Completed/Met 03/30/2021 0857 by Elon Jester, RN Outcome: Progressing   Problem: Elimination: Goal: Will not experience complications related to bowel motility 03/30/2021 1446 by Elon Jester, RN Outcome: Completed/Met 03/30/2021 0857 by Elon Jester, RN Outcome: Progressing Goal: Will not experience complications related to urinary retention 03/30/2021 1446 by Elon Jester, RN Outcome: Completed/Met 03/30/2021 0857 by Elon Jester, RN Outcome: Progressing   Problem: Pain Managment: Goal: General experience of comfort will improve 03/30/2021 1446 by Elon Jester, RN Outcome: Completed/Met 03/30/2021 0857 by Elon Jester, RN Outcome: Progressing   Problem: Safety: Goal: Ability to remain free from injury will improve 03/30/2021 1446 by Elon Jester, RN Outcome: Completed/Met 03/30/2021 0857 by Elon Jester, RN Outcome: Progressing   Problem: Skin Integrity: Goal: Risk for impaired skin integrity will decrease 03/30/2021 1446 by Elon Jester, RN Outcome: Completed/Met 03/30/2021 0857 by Elon Jester, RN Outcome: Progressing

## 2021-03-30 NOTE — TOC Transition Note (Signed)
Transition of Care [facility]) - CM/SW Discharge Note   Patient Details  Name: [name] MRN: [id number] Date of Birth: July 15, 1951  Transition of Care [facility]) CM/SW Contact:  Bethann Berkshire, [place] Phone Number: 03/30/2021, 2:04 PM   Clinical Narrative:     Patient will DC to: [facility] SNF Anticipated DC date: 03/30/21 Family notified: Amado Nash Transport by: Corey Harold   Per MD patient ready for DC to [facility] SNF . RN, patient, patient's family, and facility notified of DC. Discharge Summary and FL2 sent to facility. RN to call report prior to discharge ([phone] ask for operator and to be transferred to SNF to give report). DC packet on chart. Ambulance transport requested for patient.   CSW will sign off for now as social work intervention is no longer needed. Please consult [place] again if new needs arise.   Final next level of care: Skilled Nursing Facility Barriers to Discharge: No Barriers Identified   Patient Goals and CMS Choice Patient states their goals for this hospitalization and ongoing recovery are:: TO get stronger CMS Medicare.gov Compare Post Acute Care list provided to:: Patient Choice offered to / list presented to : Patient  Discharge Placement              Patient chooses bed at:  [facility] SNF) Patient to be transferred to facility by: [place] Name of family member notified: Amado Nash Son Patient and family notified of of transfer: 03/30/21  Discharge Plan and Services                                     Social Determinants of Health (SDOH) Interventions     Readmission Risk Interventions No flowsheet data found.

## 2021-03-30 NOTE — Plan of Care (Signed)
Pt having breakfast awaiting to go to dialysis  Problem: Education: Goal: Knowledge of General Education information will improve Description: Including pain rating scale, medication(s)/side effects and non-pharmacologic comfort measures Outcome: Progressing   Problem: Health Behavior/Discharge Planning: Goal: Ability to manage health-related needs will improve Outcome: Progressing   Problem: Clinical Measurements: Goal: Will remain free from infection Outcome: Progressing

## 2021-03-31 ENCOUNTER — Encounter: Payer: Self-pay | Admitting: Gastroenterology

## 2021-06-24 ENCOUNTER — Other Ambulatory Visit: Payer: Self-pay

## 2021-06-24 ENCOUNTER — Encounter (HOSPITAL_COMMUNITY): Payer: Self-pay | Admitting: *Deleted

## 2021-06-24 ENCOUNTER — Emergency Department (HOSPITAL_COMMUNITY)
Admission: EM | Admit: 2021-06-24 | Discharge: 2021-06-24 | Disposition: A | Discharge status: Home / Self Care | Payer: Medicare Other | Attending: Emergency Medicine | Admitting: Emergency Medicine

## 2021-06-24 DIAGNOSIS — Z992 Dependence on renal dialysis: Secondary | ICD-10-CM | POA: Diagnosis not present

## 2021-06-24 DIAGNOSIS — N186 End stage renal disease: Secondary | ICD-10-CM | POA: Insufficient documentation

## 2021-06-24 DIAGNOSIS — Z7982 Long term (current) use of aspirin: Secondary | ICD-10-CM | POA: Insufficient documentation

## 2021-06-24 DIAGNOSIS — N764 Abscess of vulva: Secondary | ICD-10-CM | POA: Insufficient documentation

## 2021-06-24 DIAGNOSIS — I132 Hypertensive heart and chronic kidney disease with heart failure and with stage 5 chronic kidney disease, or end stage renal disease: Secondary | ICD-10-CM | POA: Diagnosis not present

## 2021-06-24 DIAGNOSIS — Z79899 Other long term (current) drug therapy: Secondary | ICD-10-CM | POA: Insufficient documentation

## 2021-06-24 DIAGNOSIS — I5022 Chronic systolic (congestive) heart failure: Secondary | ICD-10-CM | POA: Diagnosis not present

## 2021-06-24 DIAGNOSIS — I2510 Atherosclerotic heart disease of native coronary artery without angina pectoris: Secondary | ICD-10-CM | POA: Diagnosis not present

## 2021-06-24 DIAGNOSIS — L0291 Cutaneous abscess, unspecified: Secondary | ICD-10-CM

## 2021-06-24 MED ORDER — DOXYCYCLINE HYCLATE 100 MG PO CAPS: 100.0000 mg | ORAL_CAPSULE | Freq: Two times a day (BID) | ORAL | 0 refills | Status: DC

## 2021-06-24 MED ORDER — LIDOCAINE-EPINEPHRINE-TETRACAINE (LET) TOPICAL GEL
3.0000 mL | Freq: Once | TOPICAL | Status: AC
  Administered 2021-06-24: 3 mL via TOPICAL
  Filled 2021-06-24: qty 3

## 2021-06-24 MED ORDER — LIDOCAINE HCL (PF) 1 % IJ SOLN
5.0000 mL | Freq: Once | INTRAMUSCULAR | Status: AC
  Administered 2021-06-24: 5 mL
  Filled 2021-06-24: qty 30

## 2021-06-24 NOTE — ED Triage Notes (Signed)
Pt with irritation to vagina yesterday, noted an abscess to vagina with drainage this morning. Pt denies diarrhea today but some earlier.

## 2021-06-24 NOTE — ED Notes (Signed)
Pt here with c/o abscess to "vagina". Pt does have hard swelling to outer labia

## 2021-06-24 NOTE — ED Provider Notes (Signed)
[facility] EMERGENCY DEPARTMENT Provider Note   CSN: [id number] Arrival date & time: 06/24/21  1633     History Chief Complaint  Patient presents with   Abscess    [name] is a 70 y.o. female.  HPI  Patient with significant medical history of end-stage renal disease currently on dialysis Tuesday Thursday Saturday, hypertension, CHF, CAD, presents to the emergency department with chief complaint of a labial abscess.  Patient states she noticed this approximate 2 days ago, states it came on suddenly, she has pain on her right labia, states it is painful when she touches it or when she rubs against, states got larger in size states she had some slight drainage and discharge, denies urinary symptoms, denies vaginal bleeding or vaginal discharge.  States she is never had this in the past, denies alleviating factors.  She states that she got her full dialysis treatment on Thursday and Saturday, she denies chest pain, shortness of breath, orthopnea, worsening pedal edema.  Past Medical History:  Diagnosis Date   Chronic systolic heart failure (HCC)    Coronary atherosclerosis of native coronary artery    Diseases of tricuspid valve    End stage renal disease ([place])    dialysis T,Th & Sat   Mitral valve insufficiency and aortic valve insufficiency    Other and unspecified hyperlipidemia    Secondary cardiomyopathy, unspecified    Unspecified essential hypertension     Patient Active Problem List   Diagnosis Date Noted   Benign neoplasm of descending colon    Abnormal CT scan, small bowel    Hiatal hernia    Generalized abdominal pain    Melena    Acute blood loss anemia    Perforation bowel ([place]) 03/18/2021   Mesenteric abscess (HCC)    Leukocytosis    Hemodialysis patient ([place]) 12/23/2019   Osteoarthritis 12/23/2019   Dilated gallbladder 07/09/2018   Renal dialysis device, implant, or graft complication 83/15/1761   Septic arthritis of shoulder, left ([place]) 11/20/2013    Loss of weight 08/04/2013   Red blood cell antibody positive 08/04/2013   Chest pain 07/27/2013   Beta-2-microglobulin amyloidosis ([place]) 07/27/2013   Kidney transplant failure 07/27/2013   Anemia 10/17/2011   Polycystic kidney 10/17/2011   ESRD on dialysis ([place]) 07/26/2010   Hyperlipidemia 08/18/2009   Mitral valve regurgitation 08/18/2009   TRICUSPID REGURGITATION, SEVERE 08/18/2009   Essential hypertension 08/18/2009   Coronary artery disease 08/18/2009   CARDIOMYOPATHY, SECONDARY 60/73/7106   SYSTOLIC HEART FAILURE, CHRONIC 08/18/2009    Past Surgical History:  Procedure Laterality Date   A/V SHUNTOGRAM N/A 08/10/2019   Procedure: A/V SHUNTOGRAM;  Surgeon: Algernon Huxley, MD;  Location: [place] [facility];  Service: Cardiovascular;  Laterality: N/A;   AV FISTULA PLACEMENT Right 2012   AV FISTULA PLACEMENT Left 2004   CATARACT EXTRACTION W/PHACO Left 08/07/2019   Procedure: CATARACT EXTRACTION PHACO AND INTRAOCULAR LENS PLACEMENT LEFT EYE;  Surgeon: [name], MD;  Location: [facility];  Service: Ophthalmology;  Laterality: Left;  left   CATARACT EXTRACTION W/PHACO Right 08/21/2019   Procedure: CATARACT EXTRACTION PHACO AND INTRAOCULAR LENS PLACEMENT RIGHT EYE (CDE: 5.45);  Surgeon: [name], MD;  Location: [facility];  Service: Ophthalmology;  Laterality: Right;   COLONOSCOPY WITH PROPOFOL N/A 03/29/2021   Procedure: COLONOSCOPY WITH PROPOFOL;  Surgeon: Ladene Artist, MD;  Location: [facility] ENDOSCOPY;  Service: Endoscopy;  Laterality: N/A;   ESOPHAGOGASTRODUODENOSCOPY (EGD) WITH PROPOFOL N/A 03/25/2021   Procedure: ESOPHAGOGASTRODUODENOSCOPY (EGD) WITH PROPOFOL;  Surgeon: Lavena Bullion, DO;  Location: [facility] ENDOSCOPY;  Service: Gastroenterology;  Laterality: N/A;   INSERTION OF DIALYSIS CATHETER N/A 01/08/2013   Procedure: INSERTION OF DIALYSIS CATHETER;  Surgeon: Angelia Mould, MD;  Location: [place];  Service: Vascular;  Laterality: N/A;   KIDNEY TRANSPLANT Left 2002   pt.  states transplant lasted 2 yrs.   PERIPHERAL VASCULAR CATHETERIZATION Right 08/27/2016   Procedure: A/V Shuntogram/Fistulagram;  Surgeon: Algernon Huxley, MD;  Location: [place] [facility];  Service: Cardiovascular;  Laterality: Right;   POLYPECTOMY  03/29/2021   Procedure: POLYPECTOMY;  Surgeon: Ladene Artist, MD;  Location: [facility] ENDOSCOPY;  Service: Endoscopy;;     OB History   No obstetric history on file.     History reviewed. No pertinent family history.  Social History   Tobacco Use   Smoking status: Never   Smokeless tobacco: Never  Vaping Use   Vaping Use: Never used  Substance Use Topics   Alcohol use: No   Drug use: No    Home Medications Prior to Admission medications   Medication Sig Start Date End Date Taking? Authorizing Provider  doxycycline (VIBRAMYCIN) 100 MG capsule Take 1 capsule (100 mg total) by mouth 2 (two) times daily for 7 days. 06/24/21 07/01/21 Yes Marcello Fennel, PA-C  acetaminophen (TYLENOL) 500 MG tablet Take 1,000 mg by mouth every 6 (six) hours as needed for pain (headache).    [provider]  aspirin 81 MG tablet Take 1 tablet (81 mg total) by mouth daily. 04/12/21   Barb Merino, MD  B Complex-C-Folic Acid (RENAL) 1 MG CAPS Take 1 mg by mouth daily.    [provider]  B Complex-C-Zn-Folic Acid (NEPHPLEX RX PO) Take 1 tablet by mouth daily.    [provider]  cinacalcet (SENSIPAR) 30 MG tablet Take 30 mg by mouth daily after supper.    [provider]  cycloSPORINE (RESTASIS) 0.05 % ophthalmic emulsion Place 1 drop into both eyes 2 (two) times daily.    [provider]  dicyclomine (BENTYL) 10 MG capsule Take 1 capsule (10 mg total) by mouth 3 (three) times daily as needed for spasms (cramping). 03/30/21   Barb Merino, MD  docusate sodium (COLACE) 100 MG capsule Take 100 mg by mouth daily. 10/28/13   [provider]  isosorbide mononitrate (IMDUR) 30 MG 24 hr tablet Take 30 mg by mouth  daily.    [provider]  labetalol (NORMODYNE) 200 MG tablet Take 100 mg by mouth 2 (two) times daily. 06/22/20   [provider]  latanoprost (XALATAN) 0.005 % ophthalmic solution Place 1 drop into both eyes at bedtime.    [provider]  minoxidil (LONITEN) 2.5 MG tablet Take 2.5 mg by mouth 2 (two) times daily.    [provider]  omeprazole (PRILOSEC) 40 MG capsule Take 40 mg by mouth daily. 10/23/17   [provider]  sevelamer carbonate (RENVELA) 800 MG tablet Take 800-2,400 mg by mouth 3 (three) times daily with meals. 800 mg with snacks. 2,400 mg with meals    [provider]  simvastatin (ZOCOR) 20 MG tablet Take 20 mg by mouth daily.    [provider]    Allergies    Dilaudid [hydromorphone], Dilaudid  [hydromorphone hcl], and Tape  Review of Systems   Review of Systems  Constitutional:  Negative for chills and fever.  HENT:  Negative for congestion.   Respiratory:  Negative for shortness of breath.  Cardiovascular:  Negative for chest pain and leg swelling.  Gastrointestinal:  Negative for abdominal pain.  Genitourinary:  Negative for enuresis, vaginal bleeding, vaginal discharge and vaginal pain.       Abscess on her right labia  Musculoskeletal:  Negative for back pain.  Skin:  Negative for rash.  Neurological:  Negative for dizziness.  Hematological:  Does not bruise/bleed easily.   Physical Exam Updated Vital Signs BP (!) 152/57 (BP Location: Left Arm)   Pulse 88   Temp 98.9 F (37.2 C) (Oral)   Resp 17   Ht 5\' 4"  (1.626 m)   Wt 56.7 kg   SpO2 96%   BMI 21.46 kg/m   Physical Exam Vitals and nursing note reviewed. Exam conducted with a chaperone present.  Constitutional:      General: She is not in acute distress.    Appearance: She is not ill-appearing.  HENT:     Head: Normocephalic and atraumatic.     Nose: No congestion.  Eyes:     Conjunctiva/sclera: Conjunctivae normal.   Cardiovascular:     Rate and Rhythm: Normal rate and regular rhythm.     Pulses: Normal pulses.     Heart sounds: No murmur heard.   No friction rub. No gallop.  Pulmonary:     Effort: No respiratory distress.     Breath sounds: No wheezing, rhonchi or rales.  Genitourinary:    Comments: With chaperone present genital exam was performed patient has a large mass at the distal end of the right labia, induration and fluctuance present, no surrounding erythema or edema, no active drainage or discharge present.  Tender to palpation. Musculoskeletal:     Right lower leg: No edema.     Left lower leg: No edema.  Skin:    General: Skin is warm and dry.     Comments: Patient has a right femoral fistula good palpable thrill no surrounding erythema or edema.  Neurological:     Mental Status: She is alert.  Psychiatric:        Mood and Affect: Mood normal.    ED Results / Procedures / Treatments   Labs (all labs ordered are listed, but only abnormal results are displayed) Labs Reviewed - No data to display  EKG None  Radiology No results found.  Procedures .Marland KitchenIncision and Drainage  Date/Time: 06/24/2021 7:46 PM Performed by: Marcello Fennel, PA-C Authorized by: Marcello Fennel, PA-C   Consent:    Consent obtained:  Verbal   Consent given by:  Patient   Risks discussed:  Bleeding, incomplete drainage, pain, damage to other organs and infection   Alternatives discussed:  No treatment, alternative treatment and observation Universal protocol:    Patient identity confirmed:  Verbally with patient Location:    Type:  Abscess   Location:  Anogenital   Anogenital location:  V[place]va Pre-procedure details:    Skin preparation:  Antiseptic wash Sedation:    Sedation type:  None Anesthesia:    Anesthesia method:  Topical application and local infiltration   Topical anesthetic:  LET   Local anesthetic:  Lidocaine 1% w/o epi Procedure type:    Complexity:  Simple Procedure  details:    Ultrasound guidance: no     Needle aspiration: no     Incision types:  Single straight   Incision depth:  Dermal   Wound management:  Probed and deloculated   Drainage:  Serosanguinous   Drainage amount:  Moderate   Wound treatment:  Wound  left open   Packing materials:  None Post-procedure details:    Procedure completion:  Tolerated well, no immediate complications   Medications Ordered in ED Medications  lidocaine (PF) (XYLOCAINE) 1 % injection 5 mL (5 mLs Infiltration Given 06/24/21 1845)  lidocaine-EPINEPHrine-tetracaine (LET) topical gel (3 mLs Topical Given 06/24/21 1844)    ED Course  I have reviewed the triage vital signs and the nursing notes.  Pertinent labs & imaging results that were available during my care of the patient were reviewed by me and considered in my medical decision making (see chart for details).    MDM Rules/Calculators/A&P                          Initial impression-presents to the emerged department with chief complaint of a right labial abscess.  She is alert, does not appear in acute stress, vital signs reassuring.  General exam reveals a abscess which will likely need I&D will recommend I&D at this time.  Work-up-due to well-appearing patient, benign for exam, further lab or imaging ordered at this time.  Reassessment- Patient with skin abscess  located right labial abscess, amenable to incision and drainage.  Patient tolerated procedure well.  Abscess was not large enough to warrant packing or drain, no signs of cellulitis surrounding skin.    Rule out-low suspicion for UTI, pyelonephritis, kidney stone as patient denies any urinary symptoms.  Low suspicion for STI as patient denies vaginal discharge, vaginal bleeding, denies pelvic pain.  Low suspicion for systemic infection as patient is nontoxic-appearing, vital signs reassuring.  Low suspicion for cellulitis as there is no overlying erythema or edema.  Low suspicion for deep tissue  infection as there is no fluctuance after I&D was performed.  Plan-  Right labial abscess-actively draining, will start on antibiotics due to her immunocompromise state, recommend over-the-counter pain medications, applying warm, to the area, following up with OB/GYN for further evaluation.  Vital signs have remained stable, no indication for hospital admission.  Patient discussed with attending and they agreed with assessment and plan.  Patient given at home care as well strict return precautions.  Patient verbalized that they understood agreed to said plan.  Final Clinical Impression(s) / ED Diagnoses Final diagnoses:  Abscess    Rx / DC Orders ED Discharge Orders          Ordered    doxycycline (VIBRAMYCIN) 100 MG capsule  2 times daily        06/24/21 1950    Ambulatory referral to Obstetrics / Gynecology        06/24/21 1953             Marcello Fennel, PA-C 06/24/21 Silvestre Moment, MD 06/25/21 [phone]

## 2021-06-24 NOTE — Discharge Instructions (Addendum)
Suspect you had a abscess of your labia, it is actively draining, please allow to drain as this will help decrease pain and inflammation.  I recommend taking a sitz bath 2-3 times daily to help with the draining process.  I start you on antibiotics please take as prescribed.  Recommend over-the-counter pain medication as prescribed.  Please call [facility] as you will need further follow-up next 5 days.  Given content with above please call  Come back to the emergency department if you develop chest pain, shortness of breath, severe abdominal pain, uncontrolled nausea, vomiting, diarrhea.

## 2021-06-28 ENCOUNTER — Inpatient Hospital Stay (HOSPITAL_COMMUNITY)
Admission: EM | Admit: 2021-06-28 | Discharge: 2021-06-29 | DRG: 757 | Disposition: A | Discharge status: Other Acute Inpatient Hospital | Payer: Medicare Other | Attending: Internal Medicine | Admitting: Internal Medicine

## 2021-06-28 ENCOUNTER — Other Ambulatory Visit: Payer: Self-pay

## 2021-06-28 ENCOUNTER — Encounter (HOSPITAL_COMMUNITY): Payer: Self-pay | Admitting: Emergency Medicine

## 2021-06-28 ENCOUNTER — Emergency Department (HOSPITAL_COMMUNITY): Payer: Medicare Other

## 2021-06-28 DIAGNOSIS — Z7982 Long term (current) use of aspirin: Secondary | ICD-10-CM

## 2021-06-28 DIAGNOSIS — Z885 Allergy status to narcotic agent status: Secondary | ICD-10-CM

## 2021-06-28 DIAGNOSIS — Z20822 Contact with and (suspected) exposure to covid-19: Secondary | ICD-10-CM | POA: Diagnosis present

## 2021-06-28 DIAGNOSIS — E785 Hyperlipidemia, unspecified: Secondary | ICD-10-CM | POA: Diagnosis present

## 2021-06-28 DIAGNOSIS — D72829 Elevated white blood cell count, unspecified: Secondary | ICD-10-CM | POA: Diagnosis present

## 2021-06-28 DIAGNOSIS — Z888 Allergy status to other drugs, medicaments and biological substances status: Secondary | ICD-10-CM

## 2021-06-28 DIAGNOSIS — N764 Abscess of vulva: Principal | ICD-10-CM | POA: Diagnosis present

## 2021-06-28 DIAGNOSIS — T8612 Kidney transplant failure: Secondary | ICD-10-CM | POA: Diagnosis present

## 2021-06-28 DIAGNOSIS — E871 Hypo-osmolality and hyponatremia: Secondary | ICD-10-CM | POA: Diagnosis present

## 2021-06-28 DIAGNOSIS — N189 Chronic kidney disease, unspecified: Secondary | ICD-10-CM

## 2021-06-28 DIAGNOSIS — Z79899 Other long term (current) drug therapy: Secondary | ICD-10-CM

## 2021-06-28 DIAGNOSIS — I10 Essential (primary) hypertension: Secondary | ICD-10-CM | POA: Diagnosis present

## 2021-06-28 DIAGNOSIS — D631 Anemia in chronic kidney disease: Secondary | ICD-10-CM | POA: Diagnosis present

## 2021-06-28 DIAGNOSIS — I120 Hypertensive chronic kidney disease with stage 5 chronic kidney disease or end stage renal disease: Secondary | ICD-10-CM | POA: Diagnosis present

## 2021-06-28 DIAGNOSIS — N186 End stage renal disease: Secondary | ICD-10-CM

## 2021-06-28 DIAGNOSIS — I2510 Atherosclerotic heart disease of native coronary artery without angina pectoris: Secondary | ICD-10-CM | POA: Diagnosis present

## 2021-06-28 DIAGNOSIS — Z992 Dependence on renal dialysis: Secondary | ICD-10-CM

## 2021-06-28 DIAGNOSIS — Y836 Removal of other organ (partial) (total) as the cause of abnormal reaction of the patient, or of later complication, without mention of misadventure at the time of the procedure: Secondary | ICD-10-CM | POA: Diagnosis present

## 2021-06-28 LAB — CBC WITH DIFFERENTIAL/PLATELET
Abs Immature Granulocytes: 0.17 10*3/uL — ABNORMAL HIGH (ref 0.00–0.07)
Basophils Absolute: 0 10*3/uL (ref 0.0–0.1)
Basophils Relative: 0 %
Eosinophils Absolute: 0.1 10*3/uL (ref 0.0–0.5)
Eosinophils Relative: 1 %
HCT: 27.5 % — ABNORMAL LOW (ref 36.0–46.0)
Hemoglobin: 8.9 g/dL — ABNORMAL LOW (ref 12.0–15.0)
Immature Granulocytes: 1 %
Lymphocytes Relative: 7 %
Lymphs Abs: 1.1 10*3/uL (ref 0.7–4.0)
MCH: 34.2 pg — ABNORMAL HIGH (ref 26.0–34.0)
MCHC: 32.4 g/dL (ref 30.0–36.0)
MCV: 105.8 fL — ABNORMAL HIGH (ref 80.0–100.0)
Monocytes Absolute: 1.6 10*3/uL — ABNORMAL HIGH (ref 0.1–1.0)
Monocytes Relative: 10 %
Neutro Abs: 12.7 10*3/uL — ABNORMAL HIGH (ref 1.7–7.7)
Neutrophils Relative %: 81 %
Platelets: 168 10*3/uL (ref 150–400)
RBC: 2.6 MIL/uL — ABNORMAL LOW (ref 3.87–5.11)
RDW: 15.1 % (ref 11.5–15.5)
WBC: 15.8 10*3/uL — ABNORMAL HIGH (ref 4.0–10.5)
nRBC: 0 % (ref 0.0–0.2)

## 2021-06-28 LAB — COMPREHENSIVE METABOLIC PANEL
ALT: 21 U/L (ref 0–44)
AST: 31 U/L (ref 15–41)
Albumin: 2.5 g/dL — ABNORMAL LOW (ref 3.5–5.0)
Alkaline Phosphatase: 67 U/L (ref 38–126)
Anion gap: 14 (ref 5–15)
BUN: 40 mg/dL — ABNORMAL HIGH (ref 8–23)
CO2: 26 mmol/L (ref 22–32)
Calcium: 8.8 mg/dL — ABNORMAL LOW (ref 8.9–10.3)
Chloride: 92 mmol/L — ABNORMAL LOW (ref 98–111)
Creatinine, Ser: 4.26 mg/dL — ABNORMAL HIGH (ref 0.44–1.00)
GFR, Estimated: 11 mL/min — ABNORMAL LOW (ref >60)
Glucose, Bld: 73 mg/dL (ref 70–99)
Potassium: 3.7 mmol/L (ref 3.5–5.1)
Sodium: 132 mmol/L — ABNORMAL LOW (ref 135–145)
Total Bilirubin: 0.7 mg/dL (ref 0.3–1.2)
Total Protein: 6.4 g/dL — ABNORMAL LOW (ref 6.5–8.1)

## 2021-06-28 LAB — RESP PANEL BY RT-PCR (FLU A&B, COVID) ARPGX2
Influenza A by PCR: NEGATIVE
Influenza B by PCR: NEGATIVE
SARS Coronavirus 2 by RT PCR: NEGATIVE

## 2021-06-28 MED ORDER — ONDANSETRON 4 MG PO TBDP
4.0000 mg | ORAL_TABLET | Freq: Once | ORAL | Status: AC
  Administered 2021-06-28: 4 mg via ORAL
  Filled 2021-06-28: qty 1

## 2021-06-28 MED ORDER — OXYCODONE-ACETAMINOPHEN 5-325 MG PO TABS
1.0000 | ORAL_TABLET | Freq: Once | ORAL | Status: AC
  Administered 2021-06-28: 1 via ORAL
  Filled 2021-06-28: qty 1

## 2021-06-28 MED ORDER — VANCOMYCIN HCL IN DEXTROSE 1-5 GM/200ML-% IV SOLN
1000.0000 mg | Freq: Once | INTRAVENOUS | Status: AC
  Administered 2021-06-29: 1000 mg via INTRAVENOUS
  Filled 2021-06-28: qty 200

## 2021-06-28 NOTE — ED Triage Notes (Addendum)
Pt here for vaginal abcess w/ swelling and pain. Pt has been seen for this before, states it has gotten worse in the last 2 days. Pt gets dialysis Tue/Thurs/Sat, went yesterday has not missed any

## 2021-06-28 NOTE — ED Provider Notes (Signed)
[facility] EMERGENCY DEPARTMENT Provider Note   CSN: [id number] Arrival date & time: 06/28/21  1243     History Chief Complaint  Patient presents with   Abscess    [name] is a 70 y.o. female.   Abscess Associated symptoms: no fever   Patient presents with right labial abscess and swelling.  Had incision and drainage done 4 days ago at [facility].  States he was feeling a little better but has been swelling since.  Denies fever.  States there has been some drainage.  States more swelling and more pain.  She is a dialysis patient.  Dialyzed yesterday.    Past Medical History:  Diagnosis Date   Chronic systolic heart failure (HCC)    Coronary atherosclerosis of native coronary artery    Diseases of tricuspid valve    End stage renal disease ([place])    dialysis T,Th & Sat   Mitral valve insufficiency and aortic valve insufficiency    Other and unspecified hyperlipidemia    Secondary cardiomyopathy, unspecified    Unspecified essential hypertension     Patient Active Problem List   Diagnosis Date Noted   Benign neoplasm of descending colon    Abnormal CT scan, small bowel    Hiatal hernia    Generalized abdominal pain    Melena    Acute blood loss anemia    Perforation bowel ([place]) 03/18/2021   Mesenteric abscess (HCC)    Leukocytosis    Hemodialysis patient ([place]) 12/23/2019   Osteoarthritis 12/23/2019   Dilated gallbladder 07/09/2018   Renal dialysis device, implant, or graft complication 23/76/2831   Septic arthritis of shoulder, left ([place]) 11/20/2013   Loss of weight 08/04/2013   Red blood cell antibody positive 08/04/2013   Chest pain 07/27/2013   Beta-2-microglobulin amyloidosis ([place]) 07/27/2013   Kidney transplant failure 07/27/2013   Anemia 10/17/2011   Polycystic kidney 10/17/2011   ESRD on dialysis ([place]) 07/26/2010   Hyperlipidemia 08/18/2009   Mitral valve regurgitation 08/18/2009   TRICUSPID REGURGITATION, SEVERE 08/18/2009    Essential hypertension 08/18/2009   Coronary artery disease 08/18/2009   CARDIOMYOPATHY, SECONDARY 51/76/1607   SYSTOLIC HEART FAILURE, CHRONIC 08/18/2009    Past Surgical History:  Procedure Laterality Date   A/V SHUNTOGRAM N/A 08/10/2019   Procedure: A/V SHUNTOGRAM;  Surgeon: Algernon Huxley, MD;  Location: [place] [facility];  Service: Cardiovascular;  Laterality: N/A;   AV FISTULA PLACEMENT Right 2012   AV FISTULA PLACEMENT Left 2004   CATARACT EXTRACTION W/PHACO Left 08/07/2019   Procedure: CATARACT EXTRACTION PHACO AND INTRAOCULAR LENS PLACEMENT LEFT EYE;  Surgeon: [name], MD;  Location: [facility];  Service: Ophthalmology;  Laterality: Left;  left   CATARACT EXTRACTION W/PHACO Right 08/21/2019   Procedure: CATARACT EXTRACTION PHACO AND INTRAOCULAR LENS PLACEMENT RIGHT EYE (CDE: 5.45);  Surgeon: [name], MD;  Location: [facility];  Service: Ophthalmology;  Laterality: Right;   COLONOSCOPY WITH PROPOFOL N/A 03/29/2021   Procedure: COLONOSCOPY WITH PROPOFOL;  Surgeon: Ladene Artist, MD;  Location: [facility] ENDOSCOPY;  Service: Endoscopy;  Laterality: N/A;   ESOPHAGOGASTRODUODENOSCOPY (EGD) WITH PROPOFOL N/A 03/25/2021   Procedure: ESOPHAGOGASTRODUODENOSCOPY (EGD) WITH PROPOFOL;  Surgeon: Lavena Bullion, DO;  Location: [place];  Service: Gastroenterology;  Laterality: N/A;   INSERTION OF DIALYSIS CATHETER N/A 01/08/2013   Procedure: INSERTION OF DIALYSIS CATHETER;  Surgeon: Angelia Mould, MD;  Location: [place];  Service: Vascular;  Laterality: N/A;   KIDNEY TRANSPLANT Left 2002   pt.  states transplant lasted 2 yrs.   PERIPHERAL VASCULAR CATHETERIZATION Right 08/27/2016   Procedure: A/V Shuntogram/Fistulagram;  Surgeon: Algernon Huxley, MD;  Location: [place] [facility];  Service: Cardiovascular;  Laterality: Right;   POLYPECTOMY  03/29/2021   Procedure: POLYPECTOMY;  Surgeon: Ladene Artist, MD;  Location: [facility] ENDOSCOPY;  Service: Endoscopy;;     OB History   No obstetric  history on file.     History reviewed. No pertinent family history.  Social History   Tobacco Use   Smoking status: Never   Smokeless tobacco: Never  Vaping Use   Vaping Use: Never used  Substance Use Topics   Alcohol use: No   Drug use: No    Home Medications Prior to Admission medications   Medication Sig Start Date End Date Taking? Authorizing Provider  acetaminophen (TYLENOL) 500 MG tablet Take 1,000 mg by mouth every 6 (six) hours as needed for pain (headache).    [provider]  aspirin 81 MG tablet Take 1 tablet (81 mg total) by mouth daily. 04/12/21   Barb Merino, MD  B Complex-C-Folic Acid (RENAL) 1 MG CAPS Take 1 mg by mouth daily.    [provider]  B Complex-C-Zn-Folic Acid (NEPHPLEX RX PO) Take 1 tablet by mouth daily.    [provider]  cinacalcet (SENSIPAR) 30 MG tablet Take 30 mg by mouth daily after supper.    [provider]  cycloSPORINE (RESTASIS) 0.05 % ophthalmic emulsion Place 1 drop into both eyes 2 (two) times daily.    [provider]  dicyclomine (BENTYL) 10 MG capsule Take 1 capsule (10 mg total) by mouth 3 (three) times daily as needed for spasms (cramping). 03/30/21   Barb Merino, MD  docusate sodium (COLACE) 100 MG capsule Take 100 mg by mouth daily. 10/28/13   [provider]  doxycycline (VIBRAMYCIN) 100 MG capsule Take 1 capsule (100 mg total) by mouth 2 (two) times daily for 7 days. 06/24/21 07/01/21  Marcello Fennel, PA-C  isosorbide mononitrate (IMDUR) 30 MG 24 hr tablet Take 30 mg by mouth daily.    [provider]  labetalol (NORMODYNE) 200 MG tablet Take 100 mg by mouth 2 (two) times daily. 06/22/20   [provider]  latanoprost (XALATAN) 0.005 % ophthalmic solution Place 1 drop into both eyes at bedtime.    [provider]  minoxidil (LONITEN) 2.5 MG tablet Take 2.5 mg by mouth 2 (two) times daily.    [provider]  omeprazole (PRILOSEC) 40 MG  capsule Take 40 mg by mouth daily. 10/23/17   [provider]  sevelamer carbonate (RENVELA) 800 MG tablet Take 800-2,400 mg by mouth 3 (three) times daily with meals. 800 mg with snacks. 2,400 mg with meals    [provider]  simvastatin (ZOCOR) 20 MG tablet Take 20 mg by mouth daily.    [provider]    Allergies    Dilaudid [hydromorphone], Dilaudid  [hydromorphone hcl], and Tape  Review of Systems   Review of Systems  Constitutional:  Negative for appetite change and fever.  Respiratory:  Negative for shortness of breath.   Cardiovascular:  Negative for chest pain.  Gastrointestinal:  Positive for abdominal pain.  Genitourinary:  Negative for difficulty urinating.       Labial pain and swelling with some drainage.  Skin:  Negative for rash.  Neurological:  Negative for weakness.  Psychiatric/Behavioral:  Negative for confusion.    Physical Exam Updated Vital Signs  BP (!) 136/52   Pulse 73   Temp 98.7 F (37.1 C)   Resp 14   Ht 5\' 4"  (1.626 m)   Wt 56.7 kg   SpO2 100%   BMI 21.46 kg/m   Physical Exam Vitals and nursing note reviewed.  HENT:     Head: Normocephalic.  Eyes:     Pupils: Pupils are equal, round, and reactive to light.  Cardiovascular:     Rate and Rhythm: Regular rhythm.  Pulmonary:     Breath sounds: No wheezing.  Abdominal:     Tenderness: There is abdominal tenderness.     Comments: Diffuse tenderness worse in the lower abdomen.  Genitourinary:    Comments: Swelling of right labia majora.  Some foul-smelling discharge but labia appears to be full of air or liquid.  Seems to track up to the right groin however. Musculoskeletal:        General: No tenderness.     Cervical back: Neck supple.     Comments: Right thigh dialysis graft.  Skin:    General: Skin is warm.     Capillary Refill: Capillary refill takes less than 2 seconds.  Neurological:     Mental Status: She is alert and oriented to person, place, and time.     ED Results / Procedures / Treatments   Labs (all labs ordered are listed, but only abnormal results are displayed) Labs Reviewed  CBC WITH DIFFERENTIAL/PLATELET - Abnormal; Notable for the following components:      Result Value   WBC 15.8 (*)    RBC 2.60 (*)    Hemoglobin 8.9 (*)    HCT 27.5 (*)    MCV 105.8 (*)    MCH 34.2 (*)    Neutro Abs 12.7 (*)    Monocytes Absolute 1.6 (*)    Abs Immature Granulocytes 0.17 (*)    All other components within normal limits  COMPREHENSIVE METABOLIC PANEL - Abnormal; Notable for the following components:   Sodium 132 (*)    Chloride 92 (*)    BUN 40 (*)    Creatinine, Ser 4.26 (*)    Calcium 8.8 (*)    Total Protein 6.4 (*)    Albumin 2.5 (*)    GFR, Estimated 11 (*)    All other components within normal limits  RESP PANEL BY RT-PCR (FLU A&B, COVID) ARPGX2    EKG None  Radiology No results found.  Procedures Procedures   Medications Ordered in ED Medications  oxyCODONE-acetaminophen (PERCOCET/ROXICET) 5-325 MG per tablet 1 tablet (1 tablet Oral Given 06/28/21 1317)  ondansetron (ZOFRAN-ODT) disintegrating tablet 4 mg (4 mg Oral Given 06/28/21 1316)    ED Course  I have reviewed the triage vital signs and the nursing notes.  Pertinent labs & imaging results that were available during my care of the patient were reviewed by me and considered in my medical decision making (see chart for details).    MDM Rules/Calculators/A&P                           Patient with right labial swelling.  Does have some purulence in it but also have gas.  It does track up to the groin.  There is some thought could be hernia or easily tracking in from abdomen which has had previous ascites.  White count elevated.  Will get CT scan.  Unable to get IV access.  IV team has attempted with ultrasound.  For  now we will get CT scan without contrast.  Care turned over to Dr. Roxanne Mins. Final Clinical Impression(s) / ED Diagnoses Final diagnoses:  None     Rx / DC Orders ED Discharge Orders     None        Davonna Belling, MD 06/28/21 2246

## 2021-06-28 NOTE — ED Provider Notes (Signed)
Emergency Medicine Provider Triage Evaluation Note  LENAH MESSENGER , a 70 y.o. female  was evaluated in triage.  Pt complains of right labial abscess. Seen 4 days ago and had I&D performed. Was placed on abx however sxs have worsened.  Review of Systems  Positive: Right labial abscess Negative: fevers  Physical Exam  BP (!) 114/50 (BP Location: Left Arm)   Pulse 83   Temp 98.7 F (37.1 C)   Resp 15   SpO2 98%  Gen:   Awake, no distress   Resp:  Normal effort  MSK:   Moves extremities without difficulty  Other:  Large fluctuant area to the right labia that is ttp. There is no crepitus noted  Medical Decision Making  Medically screening exam initiated at 1:10 PM.  Appropriate orders placed.  Ann Held was informed that the remainder of the evaluation will be completed by another provider, this initial triage assessment does not replace that evaluation, and the importance of remaining in the ED until their evaluation is complete.     Bishop Dublin 06/28/21 1313    Milton Ferguson, MD 07/02/21 [phone]

## 2021-06-28 NOTE — ED Provider Notes (Signed)
Your care assumed from Dr. Alvino Chapel, dialysis patient with labial abscess, pending CT of pelvis.  Patient was in need of IV access, IV team unable to find a vein.  Right external jugular venous line was inserted by myself and secured in place.  Patient tolerated procedure well.  CT scan did not completely visualize the affected area and she is going back for additional images, and will be started on vancomycin and will need to be admitted.  Additional CT images are obtained, and show a gas collection 6.6x4.2x2.1 cm.  We will need GYN consultation for more definitive drainage.  Case is discussed with Dr. Tonie Griffith of [facility], who agrees to admit the patient.  Results for orders placed or performed during the hospital encounter of 06/28/21  Resp Panel by RT-PCR (Flu A&B, Covid) Nasopharyngeal Swab   Specimen: Nasopharyngeal Swab; Nasopharyngeal(NP) swabs in vial transport medium  Result Value Ref Range   SARS Coronavirus 2 by RT PCR NEGATIVE NEGATIVE   Influenza A by PCR NEGATIVE NEGATIVE   Influenza B by PCR NEGATIVE NEGATIVE  CBC with Differential  Result Value Ref Range   WBC 15.8 (H) 4.0 - 10.5 K/uL   RBC 2.60 (L) 3.87 - 5.11 MIL/uL   Hemoglobin 8.9 (L) 12.0 - 15.0 g/dL   HCT 27.5 (L) 36.0 - 46.0 %   MCV 105.8 (H) 80.0 - 100.0 fL   MCH 34.2 (H) 26.0 - 34.0 pg   MCHC 32.4 30.0 - 36.0 g/dL   RDW 15.1 11.5 - 15.5 %   Platelets 168 150 - 400 K/uL   nRBC 0.0 0.0 - 0.2 %   Neutrophils Relative % 81 %   Neutro Abs 12.7 (H) 1.7 - 7.7 K/uL   Lymphocytes Relative 7 %   Lymphs Abs 1.1 0.7 - 4.0 K/uL   Monocytes Relative 10 %   Monocytes Absolute 1.6 (H) 0.1 - 1.0 K/uL   Eosinophils Relative 1 %   Eosinophils Absolute 0.1 0.0 - 0.5 K/uL   Basophils Relative 0 %   Basophils Absolute 0.0 0.0 - 0.1 K/uL   Immature Granulocytes 1 %   Abs Immature Granulocytes 0.17 (H) 0.00 - 0.07 K/uL  Comprehensive metabolic panel  Result Value Ref Range   Sodium 132 (L) 135 - 145 mmol/L    Potassium 3.7 3.5 - 5.1 mmol/L   Chloride 92 (L) 98 - 111 mmol/L   CO2 26 22 - 32 mmol/L   Glucose, Bld 73 70 - 99 mg/dL   BUN 40 (H) 8 - 23 mg/dL   Creatinine, Ser 4.26 (H) 0.44 - 1.00 mg/dL   Calcium 8.8 (L) 8.9 - 10.3 mg/dL   Total Protein 6.4 (L) 6.5 - 8.1 g/dL   Albumin 2.5 (L) 3.5 - 5.0 g/dL   AST 31 15 - 41 U/L   ALT 21 0 - 44 U/L   Alkaline Phosphatase 67 38 - 126 U/L   Total Bilirubin 0.7 0.3 - 1.2 mg/dL   GFR, Estimated 11 (L) >60 mL/min   Anion gap 14 5 - 15   CT ABDOMEN PELVIS WO CONTRAST  Addendum Date: 06/29/2021   ADDENDUM REPORT: 06/29/2021 00:21 ADDENDUM: The patient return for additional CT images of the low pelvis and upper thighs. Diffuse edema and fluid within the subcutaneous soft tissues. Large gas collection containing small amount of fluid within the right labia, this measures 6.6 cm AP by 4.2 cm craniocaudad by 2.1 cm transverse suspect for abscess. There is a small amount of gas tracking  from the dominant collection into the right perineum. No extension of gas to the mons pubis or groin. A clear fistula to the skin cannot be identified on this non contrasted exam. Electronically Signed   By: Donavan Foil M.D.   On: 06/29/2021 00:21   Result Date: 06/29/2021 CLINICAL DATA:  Labia abscess nausea vomiting EXAM: CT ABDOMEN AND PELVIS WITHOUT CONTRAST TECHNIQUE: Multidetector CT imaging of the abdomen and pelvis was performed following the standard protocol without IV contrast. COMPARISON:  CT 03/26/2021, 03/19/2021, 03/18/2021, 02/20/2021, 05/09/2020 FINDINGS: Lower chest: Lung bases demonstrate no acute consolidation. Trace pleural effusions. Cardiomegaly with coronary vascular calcification. Hepatobiliary: Subcentimeter hypodense liver lesions probably cysts. Markedly dilated gallbladder without calcified stone. Intra and extrahepatic biliary dilatation as before Pancreas: Unremarkable. No pancreatic ductal dilatation or surrounding inflammatory changes. Spleen: Normal  in size without focal abnormality. Adrenals/Urinary Tract: Adrenal glands are normal. Grossly abnormal kidneys redemonstrated. The kidneys are enlarged with multiple cysts and calcifications. Chronic severe hydronephrosis of the left kidney with hyperdense material in left renal collecting system in ureter. Left ureter dilated down to level of a clip anterior to the psoas as before. Calcified left iliac fossa transplant. The bladder is nearly empty Stomach/Bowel: The stomach is nonenlarged. No dilated small bowel. No obvious bowel wall thickening. Numerous colon diverticula without definitive wall thickening. Vascular/Lymphatic: Advanced aortic atherosclerosis. Right iliac vein stent. Incompletely visualized dialysis graft at the right groin and proximal thigh. No enlarging lymph nodes Reproductive: Calcified fibroid. No adnexal mass. Incompletely visualized gas in the right labia presumably corresponding to history of abscess. Other: No free air. Diffuse subcutaneous edema. Diffuse mesenteric edema. Mild ascites. Musculoskeletal: 6.3 cm fluid collection at the left posterior hip with interval layering hyperdensity which may be due to hemorrhage or proteinaceous debris. Mild diffuse increased bone density likely due to renal disease. Irregular degenerative change at L4-L5 is chronic IMPRESSION: 1. Markedly limited study secondary to absence of contrast and generalized lack of soft tissue contrast due to edema and lack of intra-abdominal fat. 2. Incompletely visualized gas collections in the right labia presumably corresponding to history of labial abscess. 3. Cardiomegaly with trace pleural effusions. 4. Enlarged multi-cystic kidneys with chronic left hydronephrosis and hydroureter. Hyperdense material in the left renal collecting system as before. 5. Markedly dilated gallbladder without calcification. 6. Grossly stable slightly complex fluid collection posterolateral to the left femoral trochanter. Numerous other  chronic findings as described above. Electronically Signed: By: Donavan Foil M.D. On: 06/28/2021 23:36    Images viewed by me.    Delora Fuel, MD 67/34/19 [facility] [place]

## 2021-06-28 NOTE — ED Notes (Signed)
Attempted IV start x2, unsuccessful attempts.  

## 2021-06-28 NOTE — ED Notes (Signed)
Patient transported to CT 

## 2021-06-29 ENCOUNTER — Encounter (HOSPITAL_COMMUNITY): Payer: Self-pay | Admitting: Family Medicine

## 2021-06-29 ENCOUNTER — Encounter: Payer: PRIVATE HEALTH INSURANCE | Admitting: Obstetrics & Gynecology

## 2021-06-29 DIAGNOSIS — N764 Abscess of vulva: Secondary | ICD-10-CM | POA: Diagnosis present

## 2021-06-29 DIAGNOSIS — Z992 Dependence on renal dialysis: Secondary | ICD-10-CM | POA: Diagnosis not present

## 2021-06-29 DIAGNOSIS — Z888 Allergy status to other drugs, medicaments and biological substances status: Secondary | ICD-10-CM | POA: Diagnosis not present

## 2021-06-29 DIAGNOSIS — I10 Essential (primary) hypertension: Secondary | ICD-10-CM

## 2021-06-29 DIAGNOSIS — T8612 Kidney transplant failure: Secondary | ICD-10-CM | POA: Diagnosis present

## 2021-06-29 DIAGNOSIS — Z885 Allergy status to narcotic agent status: Secondary | ICD-10-CM | POA: Diagnosis not present

## 2021-06-29 DIAGNOSIS — Z7982 Long term (current) use of aspirin: Secondary | ICD-10-CM | POA: Diagnosis not present

## 2021-06-29 DIAGNOSIS — Z79899 Other long term (current) drug therapy: Secondary | ICD-10-CM | POA: Diagnosis not present

## 2021-06-29 DIAGNOSIS — D72829 Elevated white blood cell count, unspecified: Secondary | ICD-10-CM

## 2021-06-29 DIAGNOSIS — D631 Anemia in chronic kidney disease: Secondary | ICD-10-CM | POA: Diagnosis present

## 2021-06-29 DIAGNOSIS — Z20822 Contact with and (suspected) exposure to covid-19: Secondary | ICD-10-CM | POA: Diagnosis present

## 2021-06-29 DIAGNOSIS — N186 End stage renal disease: Secondary | ICD-10-CM | POA: Diagnosis present

## 2021-06-29 DIAGNOSIS — I2510 Atherosclerotic heart disease of native coronary artery without angina pectoris: Secondary | ICD-10-CM | POA: Diagnosis present

## 2021-06-29 DIAGNOSIS — E785 Hyperlipidemia, unspecified: Secondary | ICD-10-CM | POA: Diagnosis present

## 2021-06-29 DIAGNOSIS — I120 Hypertensive chronic kidney disease with stage 5 chronic kidney disease or end stage renal disease: Secondary | ICD-10-CM | POA: Diagnosis present

## 2021-06-29 DIAGNOSIS — Y836 Removal of other organ (partial) (total) as the cause of abnormal reaction of the patient, or of later complication, without mention of misadventure at the time of the procedure: Secondary | ICD-10-CM | POA: Diagnosis present

## 2021-06-29 DIAGNOSIS — E871 Hypo-osmolality and hyponatremia: Secondary | ICD-10-CM | POA: Diagnosis present

## 2021-06-29 LAB — BLOOD CULTURE ID PANEL (REFLEXED) - BCID2

## 2021-06-29 LAB — CBC
HCT: 22.4 % — ABNORMAL LOW (ref 36.0–46.0)
Hemoglobin: 7.3 g/dL — ABNORMAL LOW (ref 12.0–15.0)
MCH: 33.5 pg (ref 26.0–34.0)
MCHC: 32.6 g/dL (ref 30.0–36.0)
MCV: 102.8 fL — ABNORMAL HIGH (ref 80.0–100.0)
Platelets: 155 10*3/uL (ref 150–400)
RBC: 2.18 MIL/uL — ABNORMAL LOW (ref 3.87–5.11)
RDW: 14.7 % (ref 11.5–15.5)
WBC: 14.8 10*3/uL — ABNORMAL HIGH (ref 4.0–10.5)
nRBC: 0 % (ref 0.0–0.2)

## 2021-06-29 LAB — BASIC METABOLIC PANEL
Anion gap: 9 (ref 5–15)
BUN: 45 mg/dL — ABNORMAL HIGH (ref 8–23)
CO2: 28 mmol/L (ref 22–32)
Calcium: 8.4 mg/dL — ABNORMAL LOW (ref 8.9–10.3)
Chloride: 93 mmol/L — ABNORMAL LOW (ref 98–111)
Creatinine, Ser: 4.77 mg/dL — ABNORMAL HIGH (ref 0.44–1.00)
GFR, Estimated: 9 mL/min — ABNORMAL LOW (ref >60)
Glucose, Bld: 78 mg/dL (ref 70–99)
Potassium: 3.8 mmol/L (ref 3.5–5.1)
Sodium: 130 mmol/L — ABNORMAL LOW (ref 135–145)

## 2021-06-29 LAB — LACTIC ACID, PLASMA
Lactic Acid, Venous: 1.1 mmol/L (ref 0.5–1.9)
Lactic Acid, Venous: 0.9 mmol/L (ref 0.5–1.9)

## 2021-06-29 MED ORDER — RENA-VITE PO TABS
1.0000 | ORAL_TABLET | Freq: Every day | ORAL | Status: DC
  Filled 2021-06-29: qty 1

## 2021-06-29 MED ORDER — SENNOSIDES-DOCUSATE SODIUM 8.6-50 MG PO TABS: 1.0000 | ORAL_TABLET | Freq: Every evening | ORAL | Status: DC | PRN

## 2021-06-29 MED ORDER — SIMVASTATIN 20 MG PO TABS
20.0000 mg | ORAL_TABLET | Freq: Every day | ORAL | Status: DC
  Administered 2021-06-29: 20 mg via ORAL
  Filled 2021-06-29: qty 1

## 2021-06-29 MED ORDER — HEPARIN SODIUM (PORCINE) 5000 UNIT/ML IJ SOLN
5000.0000 [IU] | Freq: Three times a day (TID) | INTRAMUSCULAR | Status: DC
  Administered 2021-06-29: 5000 [IU] via SUBCUTANEOUS
  Filled 2021-06-29: qty 1

## 2021-06-29 MED ORDER — ACETAMINOPHEN 325 MG PO TABS: 650.0000 mg | ORAL_TABLET | Freq: Four times a day (QID) | ORAL | Status: DC | PRN

## 2021-06-29 MED ORDER — LATANOPROST 0.005 % OP SOLN
1.0000 [drp] | Freq: Every day | OPHTHALMIC | Status: DC
  Filled 2021-06-29: qty 2.5

## 2021-06-29 MED ORDER — ONDANSETRON HCL 4 MG/2ML IJ SOLN
4.0000 mg | Freq: Four times a day (QID) | INTRAMUSCULAR | Status: DC | PRN
  Administered 2021-06-29: 4 mg via INTRAVENOUS
  Filled 2021-06-29: qty 2

## 2021-06-29 MED ORDER — PIPERACILLIN-TAZOBACTAM IN DEX 2-0.25 GM/50ML IV SOLN: 2.2500 g | Freq: Three times a day (TID) | INTRAVENOUS | Status: AC

## 2021-06-29 MED ORDER — CINACALCET HCL 30 MG PO TABS
30.0000 mg | ORAL_TABLET | Freq: Every day | ORAL | Status: DC
  Filled 2021-06-29: qty 1

## 2021-06-29 MED ORDER — MORPHINE SULFATE (PF) 2 MG/ML IV SOLN
2.0000 mg | INTRAVENOUS | Status: DC | PRN
Start: 2021-06-29 — End: 2021-06-29

## 2021-06-29 MED ORDER — LABETALOL HCL 200 MG PO TABS
100.0000 mg | ORAL_TABLET | Freq: Two times a day (BID) | ORAL | Status: DC
  Administered 2021-06-29: 100 mg via ORAL
  Filled 2021-06-29: qty 1

## 2021-06-29 MED ORDER — SEVELAMER CARBONATE 800 MG PO TABS
2400.0000 mg | ORAL_TABLET | Freq: Three times a day (TID) | ORAL | Status: DC
  Filled 2021-06-29 (×2): qty 3

## 2021-06-29 MED ORDER — SODIUM CHLORIDE 0.9 % IV SOLN: 250.0000 mL | INTRAVENOUS | Status: DC | PRN

## 2021-06-29 MED ORDER — SEVELAMER CARBONATE 800 MG PO TABS: 800.0000 mg | ORAL_TABLET | Freq: Three times a day (TID) | ORAL | Status: DC

## 2021-06-29 MED ORDER — BOOST / RESOURCE BREEZE PO LIQD CUSTOM: 1.0000 | Freq: Two times a day (BID) | ORAL | Status: DC

## 2021-06-29 MED ORDER — VANCOMYCIN HCL IN DEXTROSE 500-5 MG/100ML-% IV SOLN
500.0000 mg | INTRAVENOUS | Status: DC
  Filled 2021-06-29 (×2): qty 100

## 2021-06-29 MED ORDER — ASPIRIN EC 81 MG PO TBEC
81.0000 mg | DELAYED_RELEASE_TABLET | Freq: Every day | ORAL | Status: DC
  Administered 2021-06-29: 81 mg via ORAL
  Filled 2021-06-29: qty 1

## 2021-06-29 MED ORDER — PIPERACILLIN-TAZOBACTAM IN DEX 2-0.25 GM/50ML IV SOLN
2.2500 g | Freq: Three times a day (TID) | INTRAVENOUS | Status: DC
  Administered 2021-06-29: 2.25 g via INTRAVENOUS
  Filled 2021-06-29 (×2): qty 50

## 2021-06-29 MED ORDER — ONDANSETRON HCL 4 MG PO TABS: 4.0000 mg | ORAL_TABLET | Freq: Four times a day (QID) | ORAL | Status: DC | PRN

## 2021-06-29 MED ORDER — SEVELAMER CARBONATE 800 MG PO TABS: 800.0000 mg | ORAL_TABLET | ORAL | Status: DC | PRN

## 2021-06-29 MED ORDER — SODIUM CHLORIDE 0.9% FLUSH
3.0000 mL | Freq: Two times a day (BID) | INTRAVENOUS | Status: DC
  Administered 2021-06-29 (×2): 3 mL via INTRAVENOUS

## 2021-06-29 MED ORDER — TRAMADOL HCL 50 MG PO TABS: 50.0000 mg | ORAL_TABLET | Freq: Four times a day (QID) | ORAL | Status: DC | PRN

## 2021-06-29 MED ORDER — PIPERACILLIN-TAZOBACTAM 3.375 G IVPB 30 MIN
3.3750 g | Freq: Once | INTRAVENOUS | Status: AC
  Administered 2021-06-29: 3.375 g via INTRAVENOUS
  Filled 2021-06-29: qty 50

## 2021-06-29 MED ORDER — ACETAMINOPHEN 650 MG RE SUPP: 650.0000 mg | Freq: Four times a day (QID) | RECTAL | Status: DC | PRN

## 2021-06-29 MED ORDER — FENTANYL CITRATE PF 50 MCG/ML IJ SOSY
50.0000 ug | PREFILLED_SYRINGE | Freq: Once | INTRAMUSCULAR | Status: AC
  Administered 2021-06-29: 50 ug via INTRAVENOUS
  Filled 2021-06-29: qty 1

## 2021-06-29 MED ORDER — ISOSORBIDE MONONITRATE ER 30 MG PO TB24
30.0000 mg | ORAL_TABLET | Freq: Every day | ORAL | Status: DC
  Administered 2021-06-29: 30 mg via ORAL
  Filled 2021-06-29: qty 1

## 2021-06-29 MED ORDER — CYCLOSPORINE 0.05 % OP EMUL
1.0000 [drp] | Freq: Two times a day (BID) | OPHTHALMIC | Status: DC
  Administered 2021-06-29: 1 [drp] via OPHTHALMIC
  Filled 2021-06-29 (×2): qty 1

## 2021-06-29 MED ORDER — SODIUM CHLORIDE 0.9% FLUSH: 3.0000 mL | INTRAVENOUS | Status: DC | PRN

## 2021-06-29 MED ORDER — VANCOMYCIN HCL IN DEXTROSE 500-5 MG/100ML-% IV SOLN: 500.0000 mg | INTRAVENOUS | Status: AC

## 2021-06-29 NOTE — ED Notes (Signed)
Introduced myself to pt. Pt AxO x4, but a little difficult to understand. Pt c/o generalized pain to back/buttocks. Pt pulled up in the bed. Blood cultures/lactic drawn. Pt denies further needs.

## 2021-06-29 NOTE — ED Notes (Signed)
[facility] contacted for transport, pending arrival.

## 2021-06-29 NOTE — Discharge Summary (Signed)
Physician Discharge Summary  [name] EGB:[id number] DOB: October 09, 1951 DOA: 06/28/2021  PCP: Glenda Chroman, MD  Admit date: 06/28/2021 Discharge date: 06/29/2021  Admitted From: Home Disposition: [facility] hospital  Recommendations for Outpatient Follow-up:  Follow up with provider at [facility] [place]: No Equipment/Devices: None  Discharge Condition: Guarded CODE STATUS: Full Diet recommendation: N.p.o. for now  Brief/Interim Summary: 70 year old female with history of end-stage renal disease on hemodialysis, hypertension, hyperlipidemia, CAD, recent diagnosis of labial abscess status post I&D at [facility] a few days ago and discharged home on oral antibiotics presented with worsening labial pain and abdominal pain.  On presentation, she was found to have right labial abscess with gas on imaging study.  WBCs of 15,800.  COVID-19 and influenza a and B test were negative.  She was admitted under hospitalist service and started on broad-spectrum antibiotics vancomycin and Zosyn.  GYN was consulted who recommended transferring to tertiary care hospital for further surgical intervention by GYN.  Patient has been accepted at [facility] under Dr. Magda Paganini Kammire's service.  She will be transferred when bed is available.  Nephrology was also consulted who recommended that patient be transferred to [facility] and no need to wait for hemodialysis to happen in our hospital as patient is hemodynamically stable with no abnormal electrolytes.  Discharge Diagnoses:   Right labial abscess with gas Leukocytosis -Presented with worsening labial pain after recent I&D at [facility] showed right labial abscess with gas. -Empirically started on vancomycin and Zosyn. -GYN was consulted who recommended transferring to tertiary care hospital for further surgical intervention by GYN.  Patient has been accepted  at [facility] under Dr. Magda Paganini Kammire's service.  She will be transferred when bed is available.   -Patient will remain n.p.o. except for meds.  We will discontinue subcutaneous heparin for DVT prophylaxis.  End-stage renal disease on hemodialysis -No signs of volume overload.  Electrolytes seem stable. -Nephrology was also consulted who recommended that patient be transferred to [facility] and no need to wait for hemodialysis to happen in our hospital as patient is hemodynamically stable with no abnormal electrolytes.  Anemia of chronic disease/macrocytosis -Possibly from renal failure.  Hemoglobin stable.  Essential hypertension -BP stable. Continue current regimen.  Hyponatremia -Mild.  Will be managed by dialysis.  Hyperlipidemia -Continue statin   Discharge Instructions  Discharge Instructions     Increase activity slowly   Complete by: As directed       Allergies as of 06/29/2021       Reactions   Dilaudid [hydromorphone] Nausea And Vomiting   Tape Rash   Plastic tape        Medication List     STOP taking these medications    aspirin 81 MG tablet   doxycycline 100 MG capsule Commonly known as: VIBRAMYCIN   loperamide 2 MG capsule Commonly known as: IMODIUM       TAKE these medications    acetaminophen 500 MG tablet Commonly known as: TYLENOL Take 1,000 mg by mouth every 6 (six) hours as needed for pain (headache).   cinacalcet 30 MG tablet Commonly known as: SENSIPAR Take 30 mg by mouth daily after supper.   cycloSPORINE 0.05 % ophthalmic emulsion Commonly known as: RESTASIS Place 1 drop into both eyes 2 (two) times daily.   dicyclomine 10 MG capsule Commonly known as: BENTYL Take 1 capsule (10 mg  total) by mouth 3 (three) times daily as needed for spasms (cramping).   docusate sodium 100 MG capsule Commonly known as: COLACE Take 100 mg by mouth daily.   isosorbide mononitrate 30 MG 24 hr tablet Commonly known as:  IMDUR Take 30 mg by mouth daily.   labetalol 200 MG tablet Commonly known as: NORMODYNE Take 100 mg by mouth 2 (two) times daily.   latanoprost 0.005 % ophthalmic solution Commonly known as: XALATAN Place 1 drop into both eyes at bedtime.   minoxidil 2.5 MG tablet Commonly known as: LONITEN Take 2.5 mg by mouth 2 (two) times daily.   omeprazole 40 MG capsule Commonly known as: PRILOSEC Take 40 mg by mouth daily.   piperacillin-tazobactam 2-0.25 GM/50ML IVPB Commonly known as: ZOSYN Inject 50 mLs (2.25 g total) into the vein every 8 (eight) hours.   Renal 1 MG Caps Take 1 mg by mouth daily.   sevelamer carbonate 800 MG tablet Commonly known as: RENVELA Take 800-2,400 mg by mouth 3 (three) times daily with meals. 800 mg with snacks. 2,400 mg with meals   simvastatin 20 MG tablet Commonly known as: ZOCOR Take 20 mg by mouth daily.   vancomycin 500-5 MG/100ML-% IVPB Commonly known as: VANCOCIN Inject 100 mLs (500 mg total) into the vein Every Tuesday,Thursday,and Saturday with dialysis.   vitamin B-12 1000 MCG tablet Commonly known as: CYANOCOBALAMIN Take 1,000 mcg by mouth daily.        Allergies  Allergen Reactions   Dilaudid [Hydromorphone] Nausea And Vomiting   Tape Rash    Plastic tape    Consultations: GYN.  Communicated with nephrology/Dr. Royce Macadamia   Procedures/Studies: CT ABDOMEN PELVIS WO CONTRAST  Addendum Date: 06/29/2021   ADDENDUM REPORT: 06/29/2021 00:21 ADDENDUM: The patient return for additional CT images of the low pelvis and upper thighs. Diffuse edema and fluid within the subcutaneous soft tissues. Large gas collection containing small amount of fluid within the right labia, this measures 6.6 cm AP by 4.2 cm craniocaudad by 2.1 cm transverse suspect for abscess. There is a small amount of gas tracking from the dominant collection into the right perineum. No extension of gas to the mons pubis or groin. A clear fistula to the skin cannot be  identified on this non contrasted exam. Electronically Signed   By: Donavan Foil M.D.   On: 06/29/2021 00:21   Result Date: 06/29/2021 CLINICAL DATA:  Labia abscess nausea vomiting EXAM: CT ABDOMEN AND PELVIS WITHOUT CONTRAST TECHNIQUE: Multidetector CT imaging of the abdomen and pelvis was performed following the standard protocol without IV contrast. COMPARISON:  CT 03/26/2021, 03/19/2021, 03/18/2021, 02/20/2021, 05/09/2020 FINDINGS: Lower chest: Lung bases demonstrate no acute consolidation. Trace pleural effusions. Cardiomegaly with coronary vascular calcification. Hepatobiliary: Subcentimeter hypodense liver lesions probably cysts. Markedly dilated gallbladder without calcified stone. Intra and extrahepatic biliary dilatation as before Pancreas: Unremarkable. No pancreatic ductal dilatation or surrounding inflammatory changes. Spleen: Normal in size without focal abnormality. Adrenals/Urinary Tract: Adrenal glands are normal. Grossly abnormal kidneys redemonstrated. The kidneys are enlarged with multiple cysts and calcifications. Chronic severe hydronephrosis of the left kidney with hyperdense material in left renal collecting system in ureter. Left ureter dilated down to level of a clip anterior to the psoas as before. Calcified left iliac fossa transplant. The bladder is nearly empty Stomach/Bowel: The stomach is nonenlarged. No dilated small bowel. No obvious bowel wall thickening. Numerous colon diverticula without definitive wall thickening. Vascular/Lymphatic: Advanced aortic atherosclerosis. Right iliac vein stent. Incompletely visualized dialysis graft at  the right groin and proximal thigh. No enlarging lymph nodes Reproductive: Calcified fibroid. No adnexal mass. Incompletely visualized gas in the right labia presumably corresponding to history of abscess. Other: No free air. Diffuse subcutaneous edema. Diffuse mesenteric edema. Mild ascites. Musculoskeletal: 6.3 cm fluid collection at the left  posterior hip with interval layering hyperdensity which may be due to hemorrhage or proteinaceous debris. Mild diffuse increased bone density likely due to renal disease. Irregular degenerative change at L4-L5 is chronic IMPRESSION: 1. Markedly limited study secondary to absence of contrast and generalized lack of soft tissue contrast due to edema and lack of intra-abdominal fat. 2. Incompletely visualized gas collections in the right labia presumably corresponding to history of labial abscess. 3. Cardiomegaly with trace pleural effusions. 4. Enlarged multi-cystic kidneys with chronic left hydronephrosis and hydroureter. Hyperdense material in the left renal collecting system as before. 5. Markedly dilated gallbladder without calcification. 6. Grossly stable slightly complex fluid collection posterolateral to the left femoral trochanter. Numerous other chronic findings as described above. Electronically Signed: By: Donavan Foil M.D. On: 06/28/2021 23:36      Subjective: Patient seen and examined at bedside.  Denies any worsening abdominal pain.  No overnight fever or vomiting reported.  Discharge Exam: Vitals:   06/29/21 1030 06/29/21 1045  BP: (!) 131/52 (!) 125/54  Pulse: 70 74  Resp: 20 12  Temp:    SpO2: 100% 100%    General: Pt is alert, awake, not in acute distress.  Elderly female lying in bed.  Looks very thinly built. Cardiovascular: rate controlled, S1/S2 + Respiratory: bilateral decreased breath sounds at bases with some scattered crackles Abdominal: Soft, ND, bowel sounds +, mildly tender in the lower quadrant Extremities: no edema, no cyanosis    The results of significant diagnostics from this hospitalization (including imaging, microbiology, ancillary and laboratory) are listed below for reference.     Microbiology: Recent Results (from the past 240 hour(s))  Resp Panel by RT-PCR (Flu A&B, Covid) Nasopharyngeal Swab     Status: None   Collection Time: 06/28/21 10:04 PM    Specimen: Nasopharyngeal Swab; Nasopharyngeal(NP) swabs in vial transport medium  Result Value Ref Range Status   SARS Coronavirus 2 by RT PCR NEGATIVE NEGATIVE Final    Comment: (NOTE) SARS-CoV-2 target nucleic acids are NOT DETECTED.  The SARS-CoV-2 RNA is generally detectable in upper respiratory specimens during the acute phase of infection. The lowest concentration of SARS-CoV-2 viral copies this assay can detect is 138 copies/mL. A negative result does not preclude SARS-Cov-2 infection and should not be used as the sole basis for treatment or other patient management decisions. A negative result may occur with  improper specimen collection/handling, submission of specimen other than nasopharyngeal swab, presence of viral mutation(s) within the areas targeted by this assay, and inadequate number of viral copies(<138 copies/mL). A negative result must be combined with clinical observations, patient history, and epidemiological information. The expected result is Negative.  Fact Sheet for Patients:  EntrepreneurPulse.com.au  Fact Sheet for Healthcare Providers:  IncredibleEmployment.be  This test is no t yet approved or cleared by the [place] FDA and  has been authorized for detection and/or diagnosis of SARS-CoV-2 by FDA under an Emergency Use Authorization (EUA). This EUA will remain  in effect (meaning this test can be used) for the duration of the COVID-19 declaration under Section 564(b)(1) of the Act, 21 U.S.C.section 360bbb-3(b)(1), unless the authorization is terminated  or revoked sooner.       Influenza A  by PCR NEGATIVE NEGATIVE Final   Influenza B by PCR NEGATIVE NEGATIVE Final    Comment: (NOTE) The Xpert Xpress SARS-CoV-2/FLU/RSV plus assay is intended as an aid in the diagnosis of influenza from Nasopharyngeal swab specimens and should not be used as a sole basis for treatment. Nasal washings and aspirates are  unacceptable for Xpert Xpress SARS-CoV-2/FLU/RSV testing.  Fact Sheet for Patients: EntrepreneurPulse.com.au  Fact Sheet for Healthcare Providers: IncredibleEmployment.be  This test is not yet approved or cleared by the [place] FDA and has been authorized for detection and/or diagnosis of SARS-CoV-2 by FDA under an Emergency Use Authorization (EUA). This EUA will remain in effect (meaning this test can be used) for the duration of the COVID-19 declaration under Section 564(b)(1) of the Act, 21 U.S.C. section 360bbb-3(b)(1), unless the authorization is terminated or revoked.  Performed at [facility], [place] [address], [place], [place] [address]   Culture, blood (routine x 2)     Status: None (Preliminary result)   Collection Time: 06/29/21 12:33 AM   Specimen: BLOOD  Result Value Ref Range Status   Specimen Description BLOOD SITE NOT SPECIFIED  Final   Special Requests   Final    BOTTLES DRAWN AEROBIC AND ANAEROBIC Blood Culture adequate volume   Culture   Final    NO GROWTH < 12 HOURS Performed at [facility], [place] [address] [place], [place] [address]    Report Status PENDING  Incomplete  Culture, blood (routine x 2)     Status: None (Preliminary result)   Collection Time: 06/29/21 12:33 AM   Specimen: BLOOD  Result Value Ref Range Status   Specimen Description BLOOD LEFT HAND  Final   Special Requests   Final    BOTTLES DRAWN AEROBIC AND ANAEROBIC Blood Culture results may not be optimal due to an inadequate volume of blood received in culture bottles   Culture   Final    NO GROWTH < 12 HOURS Performed at [facility], [place] [address] [place], [place] [address]    Report Status PENDING  Incomplete     Labs: BNP (last 3 results) No results for input(s): BNP in the last 8760 hours. Basic Metabolic Panel: Recent Labs  Lab 06/28/21 1349 06/29/21 0247  NA 132* 130*  K 3.7 3.8  CL 92* 93*  CO2 26 28   GLUCOSE 73 78  BUN 40* 45*  CREATININE 4.26* 4.77*  CALCIUM 8.8* 8.4*   Liver Function Tests: Recent Labs  Lab 06/28/21 1349  AST 31  ALT 21  ALKPHOS 67  BILITOT 0.7  PROT 6.4*  ALBUMIN 2.5*   No results for input(s): LIPASE, AMYLASE in the last 168 hours. No results for input(s): AMMONIA in the last 168 hours. CBC: Recent Labs  Lab 06/28/21 1349 06/29/21 0247  WBC 15.8* 14.8*  NEUTROABS 12.7*  --   HGB 8.9* 7.3*  HCT 27.5* 22.4*  MCV 105.8* 102.8*  PLT 168 155   Cardiac Enzymes: No results for input(s): CKTOTAL, CKMB, CKMBINDEX, TROPONINI in the last 168 hours. BNP: Invalid input(s): POCBNP CBG: No results for input(s): GLUCAP in the last 168 hours. D-Dimer No results for input(s): DDIMER in the last 72 hours. Hgb A1c No results for input(s): HGBA1C in the last 72 hours. Lipid Profile No results for input(s): CHOL, HDL, LDLCALC, TRIG, CHOLHDL, LDLDIRECT in the last 72 hours. Thyroid function studies No results for input(s): TSH, T4TOTAL, T3FREE, THYROIDAB in the last 72 hours.  Invalid input(s): FREET3 Anemia work up No results for input(s): VITAMINB12, FOLATE, FERRITIN, TIBC, IRON, RETICCTPCT in the last 72 hours. Urinalysis No results found for: COLORURINE, APPEARANCEUR, [place], [place], GLUCOSEU, [place], [place], [place], PROTEINUR, UROBILINOGEN, NITRITE, LEUKOCYTESUR Sepsis Labs Invalid input(s): PROCALCITONIN,  WBC,  LACTICIDVEN Microbiology Recent Results (from the past 240 hour(s))  Resp Panel by RT-PCR (Flu A&B, Covid) Nasopharyngeal Swab     Status: None   Collection Time: 06/28/21 10:04 PM   Specimen: Nasopharyngeal Swab; Nasopharyngeal(NP) swabs in vial transport medium  Result Value Ref Range Status   SARS Coronavirus 2 by RT PCR NEGATIVE NEGATIVE Final    Comment: (NOTE) SARS-CoV-2 target nucleic acids are NOT DETECTED.  The SARS-CoV-2 RNA is generally detectable in upper respiratory specimens during the acute phase of infection.  The lowest concentration of SARS-CoV-2 viral copies this assay can detect is 138 copies/mL. A negative result does not preclude SARS-Cov-2 infection and should not be used as the sole basis for treatment or other patient management decisions. A negative result may occur with  improper specimen collection/handling, submission of specimen other than nasopharyngeal swab, presence of viral mutation(s) within the areas targeted by this assay, and inadequate number of viral copies(<138 copies/mL). A negative result must be combined with clinical observations, patient history, and epidemiological information. The expected result is Negative.  Fact Sheet for Patients:  EntrepreneurPulse.com.au  Fact Sheet for Healthcare Providers:  IncredibleEmployment.be  This test is no t yet approved or cleared by the [place] FDA and  has been authorized for detection and/or diagnosis of SARS-CoV-2 by FDA under an Emergency Use Authorization (EUA). This EUA will remain  in effect (meaning this test can be used) for the duration of the COVID-19 declaration under Section 564(b)(1) of the Act, 21 U.S.C.section 360bbb-3(b)(1), unless the authorization is terminated  or revoked sooner.       Influenza A by PCR NEGATIVE NEGATIVE Final   Influenza B by PCR NEGATIVE NEGATIVE Final    Comment: (NOTE) The Xpert Xpress SARS-CoV-2/FLU/RSV plus assay is intended as an aid in the diagnosis of influenza from Nasopharyngeal swab specimens and should not be used as a sole basis for treatment. Nasal washings and aspirates are unacceptable for Xpert Xpress SARS-CoV-2/FLU/RSV testing.  Fact Sheet for Patients: EntrepreneurPulse.com.au  Fact Sheet for Healthcare Providers: IncredibleEmployment.be  This test is not yet approved or cleared by the [place] FDA and has been authorized for detection and/or diagnosis of SARS-CoV-2 by FDA  under an Emergency Use Authorization (EUA). This EUA will remain in effect (meaning this test can be used) for the duration of the COVID-19 declaration under Section 564(b)(1) of the Act, 21 U.S.C. section 360bbb-3(b)(1), unless the authorization is terminated or revoked.  Performed at [facility], [place] [address], [place], [place] [address]   Culture, blood (routine x 2)     Status: None (Preliminary result)   Collection Time: 06/29/21 12:33 AM   Specimen: BLOOD  Result Value Ref Range Status   Specimen Description BLOOD SITE NOT SPECIFIED  Final   Special Requests   Final    BOTTLES DRAWN AEROBIC AND ANAEROBIC Blood Culture adequate volume   Culture   Final    NO GROWTH < 12 HOURS Performed at [facility], [place] [address] [place], [place] [address]    Report Status PENDING  Incomplete  Culture, blood (routine x 2)     Status: None (Preliminary result)   Collection Time: 06/29/21 12:33 AM   Specimen:  BLOOD  Result Value Ref Range Status   Specimen Description BLOOD LEFT HAND  Final   Special Requests   Final    BOTTLES DRAWN AEROBIC AND ANAEROBIC Blood Culture results may not be optimal due to an inadequate volume of blood received in culture bottles   Culture   Final    NO GROWTH < 12 HOURS Performed at [place] [address] [place], [place] [address]    Report Status PENDING  Incomplete     Time coordinating discharge: 35 minutes  SIGNED:   Aline August, MD  [facility] 06/29/2021, 10:59 AM

## 2021-06-29 NOTE — Consult Note (Signed)
OBSTETRICS AND GYNECOLOGY ATTENDING CONSULT NOTE  Consult Date: 06/29/2021 Reason for Consult: Worsening complex labial abscess, multiple medical co-morbidities Consulting Provider: Dr. Theda Belfast [facility])    Assessment/Plan: Examination and imaging findings of presence of gas which is tracking to right perineum are very concerning for possible progression to necrotizing fasciitis with this complex labial abscess. As such, she needs surgical debridement and possible further management.   Patient also has multiple medical co-morbidities (ESRD on HD, HTN, HLD, CAD). She needs a multidisciplinary team approach for any surgical intervention, and needs resources that are not available here at this hospital.  Therefore, I recommended transfer to a tertiary institution.  Discussed this with patient, she agreed with this plan and chose [facility].    I called the Physician Transfer Line and discussed patient with Dr. Dorien Chihuahua (Gynecology).  She accepted the transfer of this patient, but desired that patient undergo scheduled HD to help with preoperative planning (Cr today is 4.77).  Dr. Royce Macadamia (Nephrology) was consulted, and reported that this will not be able to be done in a timely manner and may delay care; she recommended proceeding with transfer.  This was communicated back to Dr. Forestine Na, will proceed with transfer.  Transfer details also communicated with Dr. Starla Link, who will help in coordination of the transfer. In the meantime, will continue scheduled Vancomycin and Zosyn and analgesia as needed.  Appreciate care of Ann Held by the hospitalist and nephrology teams. Also truly appreciate Dr. Forestine Na assuring the care of this complex patient.   Thank you for involving [place] in the care of this patient.  Please call [phone] [facility] OB/GYN Consult Attending Monday-Friday 8am - 5pm) or [phone] [facility] OB/GYN Attending On Call all day, every day) for any gynecologic  concerns at any time.   Total consultation time including face-to-face time with patient (>50% of time), reviewing chart, arranging for transfer to tertiary institution and documentation: 90 minutes    Verita Schneiders, MD, [place], [facility] for [facility]   History of Present Illness: MORGANE JOERGER is an 70 y.o. PMP female with ESRD on HD, HTN, HLD, CAD being admitted for management of worsening right labial abscess.  She first presented to [facility] ER on 06/24/2021 with this complaint, had pain and swelling but no fevers.  She underwent I&D of the abscess by ER physician. Since then, the abscess got bigger and worse with minimal drainage from incision site. Pain also increased significantly.   In ED, she was noted to be afebrile, WBC 15.8K, hemoglobin 8.9, Cr 4.77, COVID neg. CT scan showed "large gas collection containing small amount of fluid within the right labia, this measures 6.6 cm AP by 4.2 cm craniocaudad by 2.1 cm transverse suspect for abscess. There is a small amount of gas tracking from the dominant collection into the right perineum. No extension of gas to the mons pubis or groin. A clear fistula to the skin cannot be identified on this non contrasted exam."  She was started on Vancomycin and Zosyn and admitted to the hospitalist service and our service was consulted this morning for evaluation.  On encounter with patient, she reported feeling sore, no overt pain. Denied any vaginal bleeding fevers, chills, sweats, chest pain, shortness of breath, dysuria, nausea, vomiting, other GI or GU symptoms or other general symptoms.   Pertinent OB/GYN History: No LMP recorded. Patient is postmenopausal. Denies any recent cervical dysplasia,  STIs or other GYN issues  Patient Active Problem List   Diagnosis Date Noted   Labial abscess 06/29/2021   Benign neoplasm of descending colon    Abnormal CT scan, small  bowel    Hiatal hernia    Generalized abdominal pain    Melena    Acute blood loss anemia    Perforation bowel ([place]) 03/18/2021   Mesenteric abscess (HCC)    Leukocytosis    Hemodialysis patient ([place]) 12/23/2019   Osteoarthritis 12/23/2019   Dilated gallbladder 07/09/2018   Renal dialysis device, implant, or graft complication 54/65/6812   Septic arthritis of shoulder, left ([place]) 11/20/2013   Loss of weight 08/04/2013   Red blood cell antibody positive 08/04/2013   Chest pain 07/27/2013   Beta-2-microglobulin amyloidosis ([place]) 07/27/2013   Kidney transplant failure 07/27/2013   Anemia 10/17/2011   Polycystic kidney 10/17/2011   ESRD on dialysis ([place]) 07/26/2010   Hyperlipidemia 08/18/2009   Mitral valve regurgitation 08/18/2009   TRICUSPID REGURGITATION, SEVERE 08/18/2009   Essential hypertension 08/18/2009   Coronary artery disease 08/18/2009   CARDIOMYOPATHY, SECONDARY 75/17/0017   SYSTOLIC HEART FAILURE, CHRONIC 08/18/2009    Past Medical History:  Diagnosis Date   Chronic systolic heart failure (HCC)    Coronary atherosclerosis of native coronary artery    Diseases of tricuspid valve    End stage renal disease ([place])    dialysis T,Th & Sat   Mitral valve insufficiency and aortic valve insufficiency    Other and unspecified hyperlipidemia    Secondary cardiomyopathy, unspecified    Unspecified essential hypertension     Past Surgical History:  Procedure Laterality Date   A/V SHUNTOGRAM N/A 08/10/2019   Procedure: A/V SHUNTOGRAM;  Surgeon: Algernon Huxley, MD;  Location: [place] [facility];  Service: Cardiovascular;  Laterality: N/A;   AV FISTULA PLACEMENT Right 2012   AV FISTULA PLACEMENT Left 2004   CATARACT EXTRACTION W/PHACO Left 08/07/2019   Procedure: CATARACT EXTRACTION PHACO AND INTRAOCULAR LENS PLACEMENT LEFT EYE;  Surgeon: [name], MD;  Location: [facility];  Service: Ophthalmology;  Laterality: Left;  left   CATARACT EXTRACTION W/PHACO Right 08/21/2019    Procedure: CATARACT EXTRACTION PHACO AND INTRAOCULAR LENS PLACEMENT RIGHT EYE (CDE: 5.45);  Surgeon: [name], MD;  Location: [facility];  Service: Ophthalmology;  Laterality: Right;   COLONOSCOPY WITH PROPOFOL N/A 03/29/2021   Procedure: COLONOSCOPY WITH PROPOFOL;  Surgeon: Ladene Artist, MD;  Location: [facility] ENDOSCOPY;  Service: Endoscopy;  Laterality: N/A;   ESOPHAGOGASTRODUODENOSCOPY (EGD) WITH PROPOFOL N/A 03/25/2021   Procedure: ESOPHAGOGASTRODUODENOSCOPY (EGD) WITH PROPOFOL;  Surgeon: Lavena Bullion, DO;  Location: [facility];  Service: Gastroenterology;  Laterality: N/A;   INSERTION OF DIALYSIS CATHETER N/A 01/08/2013   Procedure: INSERTION OF DIALYSIS CATHETER;  Surgeon: Angelia Mould, MD;  Location: [facility];  Service: Vascular;  Laterality: N/A;   KIDNEY TRANSPLANT Left 2002   pt. states transplant lasted 2 yrs.   PERIPHERAL VASCULAR CATHETERIZATION Right 08/27/2016   Procedure: A/V Shuntogram/Fistulagram;  Surgeon: Algernon Huxley, MD;  Location: [facility];  Service: Cardiovascular;  Laterality: Right;   POLYPECTOMY  03/29/2021   Procedure: POLYPECTOMY;  Surgeon: Ladene Artist, MD;  Location: [facility] ENDOSCOPY;  Service: Endoscopy;;    History reviewed. No pertinent family history.  Social History:  reports that she has never smoked. She has never used smokeless tobacco. She reports that she does not drink alcohol and does not use drugs.  Allergies:  Allergies  Allergen Reactions  Dilaudid [Hydromorphone] Nausea And Vomiting   Tape Rash    Plastic tape    Medications: I have reviewed the patient's current medications. Prior to Admission: (Not in a hospital admission)  Scheduled:  cinacalcet  30 mg Oral QPC supper   cycloSPORINE  1 drop Both Eyes BID   feeding supplement  1 Container Oral BID BM   isosorbide mononitrate  30 mg Oral Daily   labetalol  100 mg Oral BID   latanoprost  1 drop Both Eyes QHS   multivitamin  1 tablet Oral QHS   sevelamer carbonate   2,400 mg Oral TID WC   simvastatin  20 mg Oral Daily   sodium chloride flush  3 mL Intravenous Q12H   vancomycin  500 mg Intravenous Q T,Th,Sa-HD   Continuous:  sodium chloride     piperacillin-tazobactam (ZOSYN)  IV     OZD:GUYQIH chloride, acetaminophen **OR** acetaminophen, ondansetron **OR** ondansetron (ZOFRAN) IV, senna-docusate, sevelamer carbonate, sodium chloride flush, traMADol Anti-infectives (From admission, onward)    Start     Dose/Rate Route Frequency Ordered Stop   06/29/21 1400  piperacillin-tazobactam (ZOSYN) IVPB 2.25 g        2.25 g 100 mL/hr over 30 Minutes Intravenous Every 8 hours 06/29/21 0404     06/29/21 1200  vancomycin (VANCOCIN) IVPB 500 mg/100 ml premix        500 mg 100 mL/hr over 60 Minutes Intravenous Every T-Th-Sa (Hemodialysis) 06/29/21 0404     06/29/21 0015  piperacillin-tazobactam (ZOSYN) IVPB 3.375 g        3.375 g 100 mL/hr over 30 Minutes Intravenous  Once 06/29/21 0004 06/29/21 0141   06/29/21 0000  vancomycin (VANCOCIN) IVPB 1000 mg/200 mL premix        1,000 mg 200 mL/hr over 60 Minutes Intravenous  Once 06/28/21 2356 06/29/21 0215   06/29/21 0000  vancomycin (VANCOCIN) 500-5 MG/100ML-% IVPB        500 mg Intravenous Every T-Th-Sa (Hemodialysis) 06/29/21 1058     06/29/21 0000  piperacillin-tazobactam (ZOSYN) 2-0.25 GM/50ML IVPB        2.25 g Intravenous Every 8 hours 06/29/21 1058         Review of Systems: Pertinent items noted in HPI and remainder of comprehensive ROS otherwise negative.  Focused Physical Examination: BP 130/82   Pulse 70   Temp 98.7 F (37.1 C)   Resp 12   Ht 5\' 4"  (1.626 m)   Wt 56.7 kg   SpO2 100%   BMI 21.46 kg/m  CONSTITUTIONAL: No acute distress.  NEUROLOGIC: Alert and oriented to person, place, and time. Normal reflexes, muscle tone coordination.  PSYCHIATRIC: Normal mood and affect. Normal behavior. Normal judgment and thought content. CARDIOVASCULAR: Normal heart rate noted, regular  rhythm MUSCULOSKELETAL: No tenderness.  No cyanosis, clubbing, or edema.  R thigh dialysis graft noted RESPIRATORY:Effort and breath sounds normal, no problems with respiration noted. ABDOMEN: Soft, normal bowel sounds, no distention noted.  No tenderness, rebound or guarding.  PELVIC: Swollen right labium majus and mons pubis with induration and some surrounding erythema noted. Erythema and edema tracking to R inguinal area and R perineum. Size is about 8 cm x 3 cm, Small amount of brown discharge expressed from previous I&D site on inferomedial part of right labium majus.  No bleeding noted. Done in the presence of a chaperone (ER RN)   Labs and Imaging: Results for orders placed or performed during the hospital encounter of 06/28/21 (from the past 72 hour(s))  CBC with Differential     Status: Abnormal   Collection Time: 06/28/21  1:49 PM  Result Value Ref Range   WBC 15.8 (H) 4.0 - 10.5 K/uL   RBC 2.60 (L) 3.87 - 5.11 MIL/uL   Hemoglobin 8.9 (L) 12.0 - 15.0 g/dL   HCT 27.5 (L) 36.0 - 46.0 %   MCV 105.8 (H) 80.0 - 100.0 fL   MCH 34.2 (H) 26.0 - 34.0 pg   MCHC 32.4 30.0 - 36.0 g/dL   RDW 15.1 11.5 - 15.5 %   Platelets 168 150 - 400 K/uL   nRBC 0.0 0.0 - 0.2 %   Neutrophils Relative % 81 %   Neutro Abs 12.7 (H) 1.7 - 7.7 K/uL   Lymphocytes Relative 7 %   Lymphs Abs 1.1 0.7 - 4.0 K/uL   Monocytes Relative 10 %   Monocytes Absolute 1.6 (H) 0.1 - 1.0 K/uL   Eosinophils Relative 1 %   Eosinophils Absolute 0.1 0.0 - 0.5 K/uL   Basophils Relative 0 %   Basophils Absolute 0.0 0.0 - 0.1 K/uL   Immature Granulocytes 1 %   Abs Immature Granulocytes 0.17 (H) 0.00 - 0.07 K/uL    Comment: Performed at [facility], [address] [place], [place] [address]  Comprehensive metabolic panel     Status: Abnormal   Collection Time: 06/28/21  1:49 PM  Result Value Ref Range   Sodium 132 (L) 135 - 145 mmol/L   Potassium 3.7 3.5 - 5.1 mmol/L   Chloride 92 (L) 98 - 111 mmol/L   CO2 26 22 -  32 mmol/L   Glucose, Bld 73 70 - 99 mg/dL    Comment: Glucose reference range applies only to samples taken after fasting for at least 8 hours.   BUN 40 (H) 8 - 23 mg/dL   Creatinine, Ser 4.26 (H) 0.44 - 1.00 mg/dL   Calcium 8.8 (L) 8.9 - 10.3 mg/dL   Total Protein 6.4 (L) 6.5 - 8.1 g/dL   Albumin 2.5 (L) 3.5 - 5.0 g/dL   AST 31 15 - 41 U/L   ALT 21 0 - 44 U/L   Alkaline Phosphatase 67 38 - 126 U/L   Total Bilirubin 0.7 0.3 - 1.2 mg/dL   GFR, Estimated 11 (L) >60 mL/min    Comment: (NOTE) Calculated using the CKD-EPI Creatinine Equation (2021)    Anion gap 14 5 - 15    Comment: Performed at [facility], [place] [address] [place], [place] [address]  Resp Panel by RT-PCR (Flu A&B, Covid) Nasopharyngeal Swab     Status: None   Collection Time: 06/28/21 10:04 PM   Specimen: Nasopharyngeal Swab; Nasopharyngeal(NP) swabs in vial transport medium  Result Value Ref Range   SARS Coronavirus 2 by RT PCR NEGATIVE NEGATIVE    Comment: (NOTE) SARS-CoV-2 target nucleic acids are NOT DETECTED.  The SARS-CoV-2 RNA is generally detectable in upper respiratory specimens during the acute phase of infection. The lowest concentration of SARS-CoV-2 viral copies this assay can detect is 138 copies/mL. A negative result does not preclude SARS-Cov-2 infection and should not be used as the sole basis for treatment or other patient management decisions. A negative result may occur with  improper specimen collection/handling, submission of specimen other than nasopharyngeal swab, presence of viral mutation(s) within the areas targeted by this assay, and inadequate number of viral copies(<138 copies/mL). A negative result must be combined with clinical observations, patient history, and epidemiological information. The expected  result is Negative.  Fact Sheet for Patients:  EntrepreneurPulse.com.au  Fact Sheet for Healthcare Providers:   IncredibleEmployment.be  This test is no t yet approved or cleared by the [place] FDA and  has been authorized for detection and/or diagnosis of SARS-CoV-2 by FDA under an Emergency Use Authorization (EUA). This EUA will remain  in effect (meaning this test can be used) for the duration of the COVID-19 declaration under Section 564(b)(1) of the Act, 21 U.S.C.section 360bbb-3(b)(1), unless the authorization is terminated  or revoked sooner.       Influenza A by PCR NEGATIVE NEGATIVE   Influenza B by PCR NEGATIVE NEGATIVE    Comment: (NOTE) The Xpert Xpress SARS-CoV-2/FLU/RSV plus assay is intended as an aid in the diagnosis of influenza from Nasopharyngeal swab specimens and should not be used as a sole basis for treatment. Nasal washings and aspirates are unacceptable for Xpert Xpress SARS-CoV-2/FLU/RSV testing.  Fact Sheet for Patients: EntrepreneurPulse.com.au  Fact Sheet for Healthcare Providers: IncredibleEmployment.be  This test is not yet approved or cleared by the [place] FDA and has been authorized for detection and/or diagnosis of SARS-CoV-2 by FDA under an Emergency Use Authorization (EUA). This EUA will remain in effect (meaning this test can be used) for the duration of the COVID-19 declaration under Section 564(b)(1) of the Act, 21 U.S.C. section 360bbb-3(b)(1), unless the authorization is terminated or revoked.  Performed at [facility], [place] [address], [place], [place] [address]   Lactic acid, plasma     Status: None   Collection Time: 06/29/21 12:33 AM  Result Value Ref Range   Lactic Acid, Venous 1.1 0.5 - 1.9 mmol/L    Comment: Performed at [facility] [address] [place], [place] [address]  Culture, blood (routine x 2)     Status: None (Preliminary result)   Collection Time: 06/29/21 12:33 AM   Specimen: BLOOD  Result Value Ref Range   Specimen Description BLOOD SITE  NOT SPECIFIED    Special Requests      BOTTLES DRAWN AEROBIC AND ANAEROBIC Blood Culture adequate volume   Culture      NO GROWTH < 12 HOURS Performed at [facility], [place] [address] [place], [place] [address]    Report Status PENDING   Culture, blood (routine x 2)     Status: None (Preliminary result)   Collection Time: 06/29/21 12:33 AM   Specimen: BLOOD  Result Value Ref Range   Specimen Description BLOOD LEFT HAND    Special Requests      BOTTLES DRAWN AEROBIC AND ANAEROBIC Blood Culture results may not be optimal due to an inadequate volume of blood received in culture bottles   Culture      NO GROWTH < 12 HOURS Performed at [facility] [address], [place], [place] [address]    Report Status PENDING   Lactic acid, plasma     Status: None   Collection Time: 06/29/21  2:47 AM  Result Value Ref Range   Lactic Acid, Venous 0.9 0.5 - 1.9 mmol/L    Comment: Performed at [facility] [address] [place], [place] [address]  Basic metabolic panel     Status: Abnormal   Collection Time: 06/29/21  2:47 AM  Result Value Ref Range   Sodium 130 (L) 135 - 145 mmol/L   Potassium 3.8 3.5 - 5.1 mmol/L   Chloride 93 (L) 98 - 111 mmol/L   CO2 28  22 - 32 mmol/L   Glucose, Bld 78 70 - 99 mg/dL    Comment: Glucose reference range applies only to samples taken after fasting for at least 8 hours.   BUN 45 (H) 8 - 23 mg/dL   Creatinine, Ser 4.77 (H) 0.44 - 1.00 mg/dL   Calcium 8.4 (L) 8.9 - 10.3 mg/dL   GFR, Estimated 9 (L) >60 mL/min    Comment: (NOTE) Calculated using the CKD-EPI Creatinine Equation (2021)    Anion gap 9 5 - 15    Comment: Performed at [facility] [address] [place], [place] [address]  CBC     Status: Abnormal   Collection Time: 06/29/21  2:47 AM  Result Value Ref Range   WBC 14.8 (H) 4.0 - 10.5 K/uL   RBC 2.18 (L) 3.87 - 5.11 MIL/uL   Hemoglobin 7.3 (L) 12.0 - 15.0 g/dL   HCT 22.4 (L) 36.0 - 46.0 %   MCV 102.8 (H) 80.0 -  100.0 fL   MCH 33.5 26.0 - 34.0 pg   MCHC 32.6 30.0 - 36.0 g/dL   RDW 14.7 11.5 - 15.5 %   Platelets 155 150 - 400 K/uL   nRBC 0.0 0.0 - 0.2 %    Comment: Performed at [facility], [place] [address], [place], [place] [address]    CT ABDOMEN PELVIS WO CONTRAST  Addendum Date: 06/29/2021   ADDENDUM REPORT: 06/29/2021 00:21 ADDENDUM: The patient return for additional CT images of the low pelvis and upper thighs. Diffuse edema and fluid within the subcutaneous soft tissues. Large gas collection containing small amount of fluid within the right labia, this measures 6.6 cm AP by 4.2 cm craniocaudad by 2.1 cm transverse suspect for abscess. There is a small amount of gas tracking from the dominant collection into the right perineum. No extension of gas to the mons pubis or groin. A clear fistula to the skin cannot be identified on this non contrasted exam. Electronically Signed   By: Donavan Foil M.D.   On: 06/29/2021 00:21   Result Date: 06/29/2021 CLINICAL DATA:  Labia abscess nausea vomiting EXAM: CT ABDOMEN AND PELVIS WITHOUT CONTRAST TECHNIQUE: Multidetector CT imaging of the abdomen and pelvis was performed following the standard protocol without IV contrast. COMPARISON:  CT 03/26/2021, 03/19/2021, 03/18/2021, 02/20/2021, 05/09/2020 FINDINGS: Lower chest: Lung bases demonstrate no acute consolidation. Trace pleural effusions. Cardiomegaly with coronary vascular calcification. Hepatobiliary: Subcentimeter hypodense liver lesions probably cysts. Markedly dilated gallbladder without calcified stone. Intra and extrahepatic biliary dilatation as before Pancreas: Unremarkable. No pancreatic ductal dilatation or surrounding inflammatory changes. Spleen: Normal in size without focal abnormality. Adrenals/Urinary Tract: Adrenal glands are normal. Grossly abnormal kidneys redemonstrated. The kidneys are enlarged with multiple cysts and calcifications. Chronic severe hydronephrosis of the left kidney with  hyperdense material in left renal collecting system in ureter. Left ureter dilated down to level of a clip anterior to the psoas as before. Calcified left iliac fossa transplant. The bladder is nearly empty Stomach/Bowel: The stomach is nonenlarged. No dilated small bowel. No obvious bowel wall thickening. Numerous colon diverticula without definitive wall thickening. Vascular/Lymphatic: Advanced aortic atherosclerosis. Right iliac vein stent. Incompletely visualized dialysis graft at the right groin and proximal thigh. No enlarging lymph nodes Reproductive: Calcified fibroid. No adnexal mass. Incompletely visualized gas in the right labia presumably corresponding to history of abscess. Other: No free air. Diffuse subcutaneous edema. Diffuse mesenteric edema. Mild ascites. Musculoskeletal: 6.3 cm fluid collection at the left posterior hip  with interval layering hyperdensity which may be due to hemorrhage or proteinaceous debris. Mild diffuse increased bone density likely due to renal disease. Irregular degenerative change at L4-L5 is chronic IMPRESSION: 1. Markedly limited study secondary to absence of contrast and generalized lack of soft tissue contrast due to edema and lack of intra-abdominal fat. 2. Incompletely visualized gas collections in the right labia presumably corresponding to history of labial abscess. 3. Cardiomegaly with trace pleural effusions. 4. Enlarged multi-cystic kidneys with chronic left hydronephrosis and hydroureter. Hyperdense material in the left renal collecting system as before. 5. Markedly dilated gallbladder without calcification. 6. Grossly stable slightly complex fluid collection posterolateral to the left femoral trochanter. Numerous other chronic findings as described above. Electronically Signed: By: Donavan Foil M.D. On: 06/28/2021 23:36

## 2021-06-29 NOTE — ED Notes (Signed)
Pt resting in bed comfortably. Denies needs.

## 2021-06-29 NOTE — Progress Notes (Signed)
Pharmacy Antibiotic Note  [name] is a 70 y.o. female with ESRD on HD TTSat admitted on 06/28/2021 with labial abscess  Pharmacy has been consulted for Vancomycin and Zosyn  dosing.  Plan: Vancomycin 500 mg IV after each HD Zosyn 2.5 g IV q8h  Height: 5\' 4"  (162.6 cm) Weight: 56.7 kg (125 lb) IBW/kg (Calculated) : 54.7  Temp (24hrs), Avg:98.7 F (37.1 C), Min:98.7 F (37.1 C), Max:98.7 F (37.1 C)  Recent Labs  Lab 06/28/21 1349 06/29/21 0033 06/29/21 0247  WBC 15.8*  --  14.8*  CREATININE 4.26*  --  4.77*  LATICACIDVEN  --  1.1 0.9    Estimated Creatinine Clearance: 9.3 mL/min (A) (by C-G formula based on SCr of 4.77 mg/dL (H)).    Allergies  Allergen Reactions   Dilaudid [Hydromorphone] Nausea And Vomiting   Tape Rash    Plastic tape     Caryl Pina 06/29/2021 4:02 AM

## 2021-06-29 NOTE — ED Notes (Signed)
Report given to April, RN

## 2021-06-29 NOTE — ED Notes (Addendum)
[facility] Bed Information: [place] [phone] (Floor)  [phone] (Charge)   For Transport: [phone] transport

## 2021-06-29 NOTE — H&P (Signed)
History and Physical    [name] LEX:[id number] DOB: 11/05/1951 DOA: 06/28/2021  PCP: Glenda Chroman, MD   Patient coming from: Home  Chief Complaint: Labial pain, abdominal pain  HPI: [name] is a 70 y.o. female with medical history significant for ESRD on HD, HTN, HLD, CAD who has dialysis on Tuesday, Thursday, Saturdays. Presents for pain in right labia and lower abdominal pain for past few days.  She was seen at [facility] a few days ago and had an I&D of a labial abscess.  Since then she has had increased swelling and pain in the area.  She states she has not had any fever or chills.  There has been some yellow-brown discharge and drainage from the I&D area.  She reports that the left side of her labia is very tender to palpation and more swollen than normal.  She reports she had a normal dialysis session yesterday.  Denies any shortness of breath, cough, chest pain or palpitations. Denies tobacco, alcohol, illicit drug use.  ED Course: In the emergency room she is found to have a right labial abscess that is 6 x 4 cm with gas present.  Been hemodynamically stable in the emergency room and was given dose of vancomycin and Zosyn in the emergency room.  Lab work revealed elevated white blood cell count of 15,800 with a hemoglobin of 8.9 hematocrit 27.5 and a platelet of 168,000.  Sodium was 132, potassium 3.7, chloride 92, bicarb 26, creatinine 4.26 from baseline of 4.3 BUN of 40 from a baseline of 16, calcium 8.8, alk phos a 67, AST 31, ALT 21, bilirubin 0.7, glucose 73, COVID-19 negative, influenza A and B-.  Hospitalist service been asked to admit for further management  Review of Systems:  General: Denies fever, chills, weight loss, night sweats.  Denies dizziness.  Denies change in appetite HENT: Denies head trauma, headache, denies change in hearing, tinnitus.  Denies nasal congestion or bleeding.  Denies sore throat, sores in mouth.  Denies difficulty swallowing Eyes: Denies blurry  vision, pain in eye, drainage.  Denies discoloration of eyes. Neck: Denies pain.  Denies swelling.  Denies pain with movement. Cardiovascular: Denies chest pain, palpitations.  Denies edema.  Denies orthopnea Respiratory: Denies shortness of breath, cough.  Denies wheezing.  Denies sputum production Gastrointestinal: Reports abdominal pain.  Denies nausea, vomiting, diarrhea.  Denies melena.  Denies hematemesis. Musculoskeletal: Denies limitation of movement.  Denies deformity or swelling.  Denies pain.  Denies arthralgias or myalgias. Genitourinary: Reports labial/pelvic pain.  Denies urinary frequency or hesitancy.  Denies dysuria.  Skin: Denies rash.  Denies petechiae, purpura, ecchymosis. Neurological: Denies syncope.  Denies seizure activity.  Denies paresthesia. Denies slurred speech, drooping face. Denies visual change. Psychiatric: Denies depression, anxiety.  Denies hallucinations.  Past Medical History:  Diagnosis Date   Chronic systolic heart failure (HCC)    Coronary atherosclerosis of native coronary artery    Diseases of tricuspid valve    End stage renal disease (HCC)    dialysis T,Th & Sat   Mitral valve insufficiency and aortic valve insufficiency    Other and unspecified hyperlipidemia    Secondary cardiomyopathy, unspecified    Unspecified essential hypertension     Past Surgical History:  Procedure Laterality Date   A/V SHUNTOGRAM N/A 08/10/2019   Procedure: A/V SHUNTOGRAM;  Surgeon: Algernon Huxley, MD;  Location: [place] [facility];  Service: Cardiovascular;  Laterality: N/A;   AV FISTULA PLACEMENT Right 2012   AV  FISTULA PLACEMENT Left 2004   CATARACT EXTRACTION W/PHACO Left 08/07/2019   Procedure: CATARACT EXTRACTION PHACO AND INTRAOCULAR LENS PLACEMENT LEFT EYE;  Surgeon: [name], MD;  Location: [facility];  Service: Ophthalmology;  Laterality: Left;  left   CATARACT EXTRACTION W/PHACO Right 08/21/2019   Procedure: CATARACT EXTRACTION PHACO AND INTRAOCULAR  LENS PLACEMENT RIGHT EYE (CDE: 5.45);  Surgeon: [name], MD;  Location: [facility];  Service: Ophthalmology;  Laterality: Right;   COLONOSCOPY WITH PROPOFOL N/A 03/29/2021   Procedure: COLONOSCOPY WITH PROPOFOL;  Surgeon: Ladene Artist, MD;  Location: [facility];  Service: Endoscopy;  Laterality: N/A;   ESOPHAGOGASTRODUODENOSCOPY (EGD) WITH PROPOFOL N/A 03/25/2021   Procedure: ESOPHAGOGASTRODUODENOSCOPY (EGD) WITH PROPOFOL;  Surgeon: Lavena Bullion, DO;  Location: [place];  Service: Gastroenterology;  Laterality: N/A;   INSERTION OF DIALYSIS CATHETER N/A 01/08/2013   Procedure: INSERTION OF DIALYSIS CATHETER;  Surgeon: Angelia Mould, MD;  Location: [place];  Service: Vascular;  Laterality: N/A;   KIDNEY TRANSPLANT Left 2002   pt. states transplant lasted 2 yrs.   PERIPHERAL VASCULAR CATHETERIZATION Right 08/27/2016   Procedure: A/V Shuntogram/Fistulagram;  Surgeon: Algernon Huxley, MD;  Location: [place] [facility];  Service: Cardiovascular;  Laterality: Right;   POLYPECTOMY  03/29/2021   Procedure: POLYPECTOMY;  Surgeon: Ladene Artist, MD;  Location: [facility] ENDOSCOPY;  Service: Endoscopy;;    Social History  reports that she has never smoked. She has never used smokeless tobacco. She reports that she does not drink alcohol and does not use drugs.  Allergies  Allergen Reactions   Dilaudid [Hydromorphone] Nausea And Vomiting   Dilaudid  [Hydromorphone Hcl]    Tape Rash    Plastic tape    History reviewed. No pertinent family history.   Prior to Admission medications   Medication Sig Start Date End Date Taking? Authorizing Provider  acetaminophen (TYLENOL) 500 MG tablet Take 1,000 mg by mouth every 6 (six) hours as needed for pain (headache).    [provider]  aspirin 81 MG tablet Take 1 tablet (81 mg total) by mouth daily. 04/12/21   Barb Merino, MD  B Complex-C-Folic Acid (RENAL) 1 MG CAPS Take 1 mg by mouth daily.    [provider]  B  Complex-C-Zn-Folic Acid (NEPHPLEX RX PO) Take 1 tablet by mouth daily.    [provider]  cinacalcet (SENSIPAR) 30 MG tablet Take 30 mg by mouth daily after supper.    [provider]  cycloSPORINE (RESTASIS) 0.05 % ophthalmic emulsion Place 1 drop into both eyes 2 (two) times daily.    [provider]  dicyclomine (BENTYL) 10 MG capsule Take 1 capsule (10 mg total) by mouth 3 (three) times daily as needed for spasms (cramping). 03/30/21   Barb Merino, MD  docusate sodium (COLACE) 100 MG capsule Take 100 mg by mouth daily. 10/28/13   [provider]  doxycycline (VIBRAMYCIN) 100 MG capsule Take 1 capsule (100 mg total) by mouth 2 (two) times daily for 7 days. 06/24/21 07/01/21  Marcello Fennel, PA-C  isosorbide mononitrate (IMDUR) 30 MG 24 hr tablet Take 30 mg by mouth daily.    [provider]  labetalol (NORMODYNE) 200 MG tablet Take 100 mg by mouth 2 (two) times daily. 06/22/20   [provider]  latanoprost (XALATAN) 0.005 % ophthalmic solution Place 1 drop into both eyes at bedtime.    [provider]  minoxidil (LONITEN) 2.5 MG tablet Take 2.5 mg by mouth 2 (two)  times daily.    [provider]  omeprazole (PRILOSEC) 40 MG capsule Take 40 mg by mouth daily. 10/23/17   [provider]  sevelamer carbonate (RENVELA) 800 MG tablet Take 800-2,400 mg by mouth 3 (three) times daily with meals. 800 mg with snacks. 2,400 mg with meals    [provider]  simvastatin (ZOCOR) 20 MG tablet Take 20 mg by mouth daily.    [provider]    Physical Exam: Vitals:   06/28/21 2352 06/28/21 2355 06/29/21 0041 06/29/21 0200  BP:  (!) 142/53 (!) 129/50 (!) 137/48  Pulse: 66 66 64 64  Resp: _0 Temp:      SpO2: 100% 100% 100% 98%  Weight:      Height:        Constitutional: NAD, calm, comfortable Vitals:   06/28/21 2352 06/28/21 2355 06/29/21 0041 06/29/21 0200  BP:  (!) 142/53 (!) 129/50 (!)  137/48  Pulse: 66 66 64 64  Resp: _1 Temp:      SpO2: 100% 100% 100% 98%  Weight:      Height:       General: WDWN, Alert and oriented x3.  Eyes: EOMI, PERRL, conjunctivae normal.  Sclera nonicteric HENT:  [place]/AT, external ears normal.  Nares patent without epistasis.  Mucous membranes are moist. Neck: Soft, normal range of motion, supple, no masses, Trachea midline Respiratory: clear to auscultation bilaterally, no wheezing, no crackles. Normal respiratory effort. No accessory muscle use.  Cardiovascular: Regular rate and rhythm, no murmurs / rubs / gallops. No extremity edema. 2+ pedal pulses. No carotid bruits.  Abdomen: Soft, mild lower tenderness, nondistended, no rebound or guarding. No masses palpated. Bowel sounds normoactive Musculoskeletal: FROM. no cyanosis. No joint deformity upper and lower extremities. Normal muscle tone.  GU: Deferred by patient Skin: Warm, dry, intact no rashes, lesions, ulcers. No induration Neurologic: CN 2-12 grossly intact.  Normal speech.  Sensation intact to touch. Strength 4/5 in all extremities.   Psychiatric: Normal judgment and insight.  Normal mood.    Labs on Admission: I have personally reviewed following labs and imaging studies  CBC: Recent Labs  Lab 06/28/21 1349  WBC 15.8*  NEUTROABS 12.7*  HGB 8.9*  HCT 27.5*  MCV 105.8*  PLT 116    Basic Metabolic Panel: Recent Labs  Lab 06/28/21 1349  NA 132*  K 3.7  CL 92*  CO2 26  GLUCOSE 73  BUN 40*  CREATININE 4.26*  CALCIUM 8.8*    GFR: Estimated Creatinine Clearance: 10.5 mL/min (A) (by C-G formula based on SCr of 4.26 mg/dL (H)).  Liver Function Tests: Recent Labs  Lab 06/28/21 1349  AST 31  ALT 21  ALKPHOS 67  BILITOT 0.7  PROT 6.4*  ALBUMIN 2.5*    Urine analysis: No results found for: COLORURINE, APPEARANCEUR, LABSPEC, PHURINE, GLUCOSEU, HGBUR, BILIRUBINUR, KETONESUR, PROTEINUR, UROBILINOGEN, NITRITE, LEUKOCYTESUR  Radiological Exams on  Admission: CT ABDOMEN PELVIS WO CONTRAST  Addendum Date: 06/29/2021   ADDENDUM REPORT: 06/29/2021 00:21 ADDENDUM: The patient return for additional CT images of the low pelvis and upper thighs. Diffuse edema and fluid within the subcutaneous soft tissues. Large gas collection containing small amount of fluid within the right labia, this measures 6.6 cm AP by 4.2 cm craniocaudad by 2.1 cm transverse suspect for abscess. There is a small amount of gas tracking from the dominant collection into the right perineum. No extension of gas to the mons pubis or  groin. A clear fistula to the skin cannot be identified on this non contrasted exam. Electronically Signed   By: Donavan Foil M.D.   On: 06/29/2021 00:21   Result Date: 06/29/2021 CLINICAL DATA:  Labia abscess nausea vomiting EXAM: CT ABDOMEN AND PELVIS WITHOUT CONTRAST TECHNIQUE: Multidetector CT imaging of the abdomen and pelvis was performed following the standard protocol without IV contrast. COMPARISON:  CT 03/26/2021, 03/19/2021, 03/18/2021, 02/20/2021, 05/09/2020 FINDINGS: Lower chest: Lung bases demonstrate no acute consolidation. Trace pleural effusions. Cardiomegaly with coronary vascular calcification. Hepatobiliary: Subcentimeter hypodense liver lesions probably cysts. Markedly dilated gallbladder without calcified stone. Intra and extrahepatic biliary dilatation as before Pancreas: Unremarkable. No pancreatic ductal dilatation or surrounding inflammatory changes. Spleen: Normal in size without focal abnormality. Adrenals/Urinary Tract: Adrenal glands are normal. Grossly abnormal kidneys redemonstrated. The kidneys are enlarged with multiple cysts and calcifications. Chronic severe hydronephrosis of the left kidney with hyperdense material in left renal collecting system in ureter. Left ureter dilated down to level of a clip anterior to the psoas as before. Calcified left iliac fossa transplant. The bladder is nearly empty Stomach/Bowel: The stomach  is nonenlarged. No dilated small bowel. No obvious bowel wall thickening. Numerous colon diverticula without definitive wall thickening. Vascular/Lymphatic: Advanced aortic atherosclerosis. Right iliac vein stent. Incompletely visualized dialysis graft at the right groin and proximal thigh. No enlarging lymph nodes Reproductive: Calcified fibroid. No adnexal mass. Incompletely visualized gas in the right labia presumably corresponding to history of abscess. Other: No free air. Diffuse subcutaneous edema. Diffuse mesenteric edema. Mild ascites. Musculoskeletal: 6.3 cm fluid collection at the left posterior hip with interval layering hyperdensity which may be due to hemorrhage or proteinaceous debris. Mild diffuse increased bone density likely due to renal disease. Irregular degenerative change at L4-L5 is chronic IMPRESSION: 1. Markedly limited study secondary to absence of contrast and generalized lack of soft tissue contrast due to edema and lack of intra-abdominal fat. 2. Incompletely visualized gas collections in the right labia presumably corresponding to history of labial abscess. 3. Cardiomegaly with trace pleural effusions. 4. Enlarged multi-cystic kidneys with chronic left hydronephrosis and hydroureter. Hyperdense material in the left renal collecting system as before. 5. Markedly dilated gallbladder without calcification. 6. Grossly stable slightly complex fluid collection posterolateral to the left femoral trochanter. Numerous other chronic findings as described above. Electronically Signed: By: Donavan Foil M.D. On: 06/28/2021 23:36    Assessment/Plan Principal Problem:   Labial abscess Ms. Test is admitted to [place] floor. She is started on vancomycin and Zosyn for empiric antibiotic coverage, pharmacy will dose GYN will be consulted in the morning for evaluation of possible operative debridement and drainage of abscess Check CBC, electrolytes and renal function in morning  Active  Problems:   Essential hypertension Continue labetalol, Imdur.  Monitor blood pressure    ESRD on dialysis  Patient has hemodialysis on Tuesday Thursdays and Saturdays.  Nephrology be consulted in the morning for hemodialysis treatment    Leukocytosis White blood cell count was elevated.  CBC be rechecked in morning.  Patient is on antibiotic as above    DVT prophylaxis: Heparin for DVT prophylaxis.   Code Status:   Full Code  Family Communication:  Diagnosis and plan discussed with patient.  Verbalized understanding agrees to plan.  Further recommendations to follow as clinical indicated Disposition Plan:   Patient is from:  Home  Anticipated DC to:  home  Anticipated DC date:  Anticipate 2 midnight or more stay  Anticipated DC barriers: No barriers  for discharge identified at this time  Consults to be called:  Will need Nephrology and GYN consulted in am  Admission status:  Inpatient  Yevonne Aline Chotiner MD [facility]  How to contact the [facility] Attending or Consulting provider [place] or covering provider during after hours [place], for this patient?   Check the care team in [facility] and look for a) attending/consulting TR[facility] provider listed and b) the [facility] team listed Log into [email or web address] and use [place]'s universal password to access. If you do not have the password, please contact the hospital operator. Locate the [facility] provider you are looking for under [facility] and page to a number that you can be directly reached. If you still have difficulty reaching the provider, please page the [facility] (Director on Call) for the Hospitalists listed on amion for assistance.  06/29/2021, 2:25 AM

## 2021-07-01 LAB — CULTURE, BLOOD (ROUTINE X 2): Special Requests: ADEQUATE

## 2021-07-02 NOTE — Progress Notes (Signed)
ED Antimicrobial Stewardship Positive Culture Follow Up   [name] is an 70 y.o. female who presented to [facility] on 06/28/2021 with a chief complaint of  Chief Complaint  Patient presents with   Abscess    Recent Results (from the past 720 hour(s))  Resp Panel by RT-PCR (Flu A&B, Covid) Nasopharyngeal Swab     Status: None   Collection Time: 06/28/21 10:04 PM   Specimen: Nasopharyngeal Swab; Nasopharyngeal(NP) swabs in vial transport medium  Result Value Ref Range Status   SARS Coronavirus 2 by RT PCR NEGATIVE NEGATIVE Final    Comment: (NOTE) SARS-CoV-2 target nucleic acids are NOT DETECTED.  The SARS-CoV-2 RNA is generally detectable in upper respiratory specimens during the acute phase of infection. The lowest concentration of SARS-CoV-2 viral copies this assay can detect is 138 copies/mL. A negative result does not preclude SARS-Cov-2 infection and should not be used as the sole basis for treatment or other patient management decisions. A negative result may occur with  improper specimen collection/handling, submission of specimen other than nasopharyngeal swab, presence of viral mutation(s) within the areas targeted by this assay, and inadequate number of viral copies(<138 copies/mL). A negative result must be combined with clinical observations, patient history, and epidemiological information. The expected result is Negative.  Fact Sheet for Patients:  EntrepreneurPulse.com.au  Fact Sheet for Healthcare Providers:  IncredibleEmployment.be  This test is no t yet approved or cleared by the [place] FDA and  has been authorized for detection and/or diagnosis of SARS-CoV-2 by FDA under an Emergency Use Authorization (EUA). This EUA will remain  in effect (meaning this test can be used) for the duration of the COVID-19 declaration under Section 564(b)(1) of the Act, 21 U.S.C.section 360bbb-3(b)(1), unless the authorization is  terminated  or revoked sooner.       Influenza A by PCR NEGATIVE NEGATIVE Final   Influenza B by PCR NEGATIVE NEGATIVE Final    Comment: (NOTE) The Xpert Xpress SARS-CoV-2/FLU/RSV plus assay is intended as an aid in the diagnosis of influenza from Nasopharyngeal swab specimens and should not be used as a sole basis for treatment. Nasal washings and aspirates are unacceptable for Xpert Xpress SARS-CoV-2/FLU/RSV testing.  Fact Sheet for Patients: EntrepreneurPulse.com.au  Fact Sheet for Healthcare Providers: IncredibleEmployment.be  This test is not yet approved or cleared by the [place] FDA and has been authorized for detection and/or diagnosis of SARS-CoV-2 by FDA under an Emergency Use Authorization (EUA). This EUA will remain in effect (meaning this test can be used) for the duration of the COVID-19 declaration under Section 564(b)(1) of the Act, 21 U.S.C. section 360bbb-3(b)(1), unless the authorization is terminated or revoked.  Performed at [facility], [place] [address] [place], [place] [address]   Culture, blood (routine x 2)     Status: Abnormal   Collection Time: 06/29/21 12:33 AM   Specimen: BLOOD  Result Value Ref Range Status   Specimen Description BLOOD SITE NOT SPECIFIED  Final   Special Requests   Final    BOTTLES DRAWN AEROBIC AND ANAEROBIC Blood Culture adequate volume   Culture  Setup Time   Final    GRAM POSITIVE COCCI IN CLUSTERS IN BOTH AEROBIC AND ANAEROBIC BOTTLES CRITICAL RESULT CALLED TO, READ BACK BY AND VERIFIED WITH: Janae Sauce RN AT [facility] @2214  06/29/21 EB    Culture (A)  Final    STAPHYLOCOCCUS HOMINIS THE SIGNIFICANCE OF ISOLATING THIS ORGANISM FROM A SINGLE SET OF BLOOD CULTURES WHEN MULTIPLE  SETS ARE DRAWN IS UNCERTAIN. PLEASE NOTIFY THE MICROBIOLOGY DEPARTMENT WITHIN ONE WEEK IF SPECIATION AND SENSITIVITIES ARE REQUIRED. Performed at [facility], [place] [address], [place],  [place] [address]    Report Status 07/01/2021 FINAL  Final  Culture, blood (routine x 2)     Status: Abnormal   Collection Time: 06/29/21 12:33 AM   Specimen: BLOOD  Result Value Ref Range Status   Specimen Description BLOOD LEFT HAND  Final   Special Requests   Final    BOTTLES DRAWN AEROBIC AND ANAEROBIC Blood Culture results may not be optimal due to an inadequate volume of blood received in culture bottles   Culture  Setup Time   Final    GRAM POSITIVE COCCI IN CLUSTERS IN BOTH AEROBIC AND ANAEROBIC BOTTLES CRITICAL VALUE NOTED.  VALUE IS CONSISTENT WITH PREVIOUSLY REPORTED AND CALLED VALUE.    Culture (A)  Final    STAPHYLOCOCCUS EPIDERMIDIS THE SIGNIFICANCE OF ISOLATING THIS ORGANISM FROM A SINGLE SET OF BLOOD CULTURES WHEN MULTIPLE SETS ARE DRAWN IS UNCERTAIN. PLEASE NOTIFY THE MICROBIOLOGY DEPARTMENT WITHIN ONE WEEK IF SPECIATION AND SENSITIVITIES ARE REQUIRED. Performed at [facility], [place] [address] [place], [place] [address]    Report Status 07/01/2021 FINAL  Final  Blood Culture ID Panel (Reflexed)     Status: Abnormal   Collection Time: 06/29/21 12:33 AM  Result Value Ref Range Status   Enterococcus faecalis NOT DETECTED NOT DETECTED Final   Enterococcus Faecium NOT DETECTED NOT DETECTED Final   Listeria monocytogenes NOT DETECTED NOT DETECTED Final   Staphylococcus species DETECTED (A) NOT DETECTED Final    Comment: CRITICAL RESULT CALLED TO, READ BACK BY AND VERIFIED WITH: ELIZABETH HORTON RN AT [facility] @2214  06/29/21 EB    Staphylococcus aureus (BCID) NOT DETECTED NOT DETECTED Final   Staphylococcus epidermidis NOT DETECTED NOT DETECTED Final   Staphylococcus lugdunensis NOT DETECTED NOT DETECTED Final   Streptococcus species NOT DETECTED NOT DETECTED Final   Streptococcus agalactiae NOT DETECTED NOT DETECTED Final   Streptococcus pneumoniae NOT DETECTED NOT DETECTED Final   Streptococcus pyogenes NOT DETECTED NOT DETECTED Final   A.calcoaceticus-baumannii NOT  DETECTED NOT DETECTED Final   Bacteroides fragilis NOT DETECTED NOT DETECTED Final   Enterobacterales NOT DETECTED NOT DETECTED Final   Enterobacter cloacae complex NOT DETECTED NOT DETECTED Final   Escherichia coli NOT DETECTED NOT DETECTED Final   Klebsiella aerogenes NOT DETECTED NOT DETECTED Final   Klebsiella oxytoca NOT DETECTED NOT DETECTED Final   Klebsiella pneumoniae NOT DETECTED NOT DETECTED Final   Proteus species NOT DETECTED NOT DETECTED Final   Salmonella species NOT DETECTED NOT DETECTED Final   Serratia marcescens NOT DETECTED NOT DETECTED Final   Haemophilus influenzae NOT DETECTED NOT DETECTED Final   Neisseria meningitidis NOT DETECTED NOT DETECTED Final   Pseudomonas aeruginosa NOT DETECTED NOT DETECTED Final   Stenotrophomonas maltophilia NOT DETECTED NOT DETECTED Final   Candida albicans NOT DETECTED NOT DETECTED Final   Candida auris NOT DETECTED NOT DETECTED Final   Candida glabrata NOT DETECTED NOT DETECTED Final   Candida krusei NOT DETECTED NOT DETECTED Final   Candida parapsilosis NOT DETECTED NOT DETECTED Final   Candida tropicalis NOT DETECTED NOT DETECTED Final   Cryptococcus neoformans/gattii NOT DETECTED NOT DETECTED Final    Comment: Performed at [facility], [address], [place], [place] [address]    Results faxed and verbally reported to RN Durward Fortes at [facility] with feedback at 1045 07/02/21.  No further action required.   Heloise Purpura 07/02/2021, 12:00 PM Clinical Pharmacist Monday - Friday phone -  [phone] Saturday - Sunday phone - [phone]

## 2021-07-03 ENCOUNTER — Telehealth: Payer: Self-pay

## 2021-07-03 NOTE — Telephone Encounter (Signed)
Post ED Visit - Positive Culture Follow-up  Culture report reviewed by antimicrobial stewardship pharmacist: [place] Team  []  Heide Guile, Pharm.D., BCPS AQ-ID [x]  Lorelei Pont, Pharm.D., BCPS []  Alycia Rossetti, Pharm.D., BCPS []  [place], Pharm.D., BCPS, AAHIVP []  Legrand Como, Pharm.D., BCPS, AAHIVP []  Salome Arnt, PharmD, BCPS []  Johnnette Gourd, PharmD, BCPS []  Hughes Better, PharmD, BCPS []  Leeroy Cha, PharmD []  Laqueta Linden, PharmD, BCPS []  Albertina Parr, PharmD  [place] Team []  Leodis Sias, PharmD []  Lindell Spar, PharmD []  Royetta Asal, PharmD []  Graylin Shiver, Rph []  Rema Fendt) Glennon Mac, PharmD []  Arlyn Dunning, PharmD []  Netta Cedars, PharmD []  Dia Sitter, PharmD []  Leone Haven, PharmD []  Gretta Arab, PharmD []  Theodis Shove, PharmD []  Peggyann Juba, PharmD []  Reuel Boom, PharmD   Positive blood culture Treated with Piperacillin-Tazobactam and Vancomycin HCL, organism sensitive to the same and no further patient follow-up is required at this time.  Glennon Hamilton 07/03/2021, 9:26 AM

## 2021-07-20 DIAGNOSIS — Z48817 Encounter for surgical aftercare following surgery on the skin and subcutaneous tissue: Secondary | ICD-10-CM | POA: Diagnosis not present

## 2021-07-20 DIAGNOSIS — Z992 Dependence on renal dialysis: Secondary | ICD-10-CM | POA: Diagnosis not present

## 2021-07-20 DIAGNOSIS — D509 Iron deficiency anemia, unspecified: Secondary | ICD-10-CM | POA: Diagnosis not present

## 2021-07-20 DIAGNOSIS — D631 Anemia in chronic kidney disease: Secondary | ICD-10-CM | POA: Diagnosis not present

## 2021-07-20 DIAGNOSIS — N186 End stage renal disease: Secondary | ICD-10-CM | POA: Diagnosis not present

## 2021-07-22 DIAGNOSIS — N186 End stage renal disease: Secondary | ICD-10-CM | POA: Diagnosis not present

## 2021-07-22 DIAGNOSIS — D631 Anemia in chronic kidney disease: Secondary | ICD-10-CM | POA: Diagnosis not present

## 2021-07-22 DIAGNOSIS — Z992 Dependence on renal dialysis: Secondary | ICD-10-CM | POA: Diagnosis not present

## 2021-07-22 DIAGNOSIS — D509 Iron deficiency anemia, unspecified: Secondary | ICD-10-CM | POA: Diagnosis not present

## 2021-07-25 DIAGNOSIS — D509 Iron deficiency anemia, unspecified: Secondary | ICD-10-CM | POA: Diagnosis not present

## 2021-07-25 DIAGNOSIS — D631 Anemia in chronic kidney disease: Secondary | ICD-10-CM | POA: Diagnosis not present

## 2021-07-25 DIAGNOSIS — N186 End stage renal disease: Secondary | ICD-10-CM | POA: Diagnosis not present

## 2021-07-25 DIAGNOSIS — Z992 Dependence on renal dialysis: Secondary | ICD-10-CM | POA: Diagnosis not present

## 2021-07-27 DIAGNOSIS — D631 Anemia in chronic kidney disease: Secondary | ICD-10-CM | POA: Diagnosis not present

## 2021-07-27 DIAGNOSIS — Z992 Dependence on renal dialysis: Secondary | ICD-10-CM | POA: Diagnosis not present

## 2021-07-27 DIAGNOSIS — N186 End stage renal disease: Secondary | ICD-10-CM | POA: Diagnosis not present

## 2021-07-27 DIAGNOSIS — D509 Iron deficiency anemia, unspecified: Secondary | ICD-10-CM | POA: Diagnosis not present

## 2021-07-29 DIAGNOSIS — D509 Iron deficiency anemia, unspecified: Secondary | ICD-10-CM | POA: Diagnosis not present

## 2021-07-29 DIAGNOSIS — D631 Anemia in chronic kidney disease: Secondary | ICD-10-CM | POA: Diagnosis not present

## 2021-07-29 DIAGNOSIS — Z992 Dependence on renal dialysis: Secondary | ICD-10-CM | POA: Diagnosis not present

## 2021-07-29 DIAGNOSIS — N186 End stage renal disease: Secondary | ICD-10-CM | POA: Diagnosis not present

## 2021-08-01 DIAGNOSIS — D631 Anemia in chronic kidney disease: Secondary | ICD-10-CM | POA: Diagnosis not present

## 2021-08-01 DIAGNOSIS — D509 Iron deficiency anemia, unspecified: Secondary | ICD-10-CM | POA: Diagnosis not present

## 2021-08-01 DIAGNOSIS — Z992 Dependence on renal dialysis: Secondary | ICD-10-CM | POA: Diagnosis not present

## 2021-08-01 DIAGNOSIS — N186 End stage renal disease: Secondary | ICD-10-CM | POA: Diagnosis not present

## 2021-08-03 DIAGNOSIS — Z992 Dependence on renal dialysis: Secondary | ICD-10-CM | POA: Diagnosis not present

## 2021-08-03 DIAGNOSIS — D631 Anemia in chronic kidney disease: Secondary | ICD-10-CM | POA: Diagnosis not present

## 2021-08-03 DIAGNOSIS — D509 Iron deficiency anemia, unspecified: Secondary | ICD-10-CM | POA: Diagnosis not present

## 2021-08-03 DIAGNOSIS — N186 End stage renal disease: Secondary | ICD-10-CM | POA: Diagnosis not present

## 2021-08-04 DIAGNOSIS — Z299 Encounter for prophylactic measures, unspecified: Secondary | ICD-10-CM | POA: Diagnosis not present

## 2021-08-04 DIAGNOSIS — I10 Essential (primary) hypertension: Secondary | ICD-10-CM | POA: Diagnosis not present

## 2021-08-04 DIAGNOSIS — K649 Unspecified hemorrhoids: Secondary | ICD-10-CM | POA: Diagnosis not present

## 2021-08-04 DIAGNOSIS — R197 Diarrhea, unspecified: Secondary | ICD-10-CM | POA: Diagnosis not present

## 2021-08-04 DIAGNOSIS — K625 Hemorrhage of anus and rectum: Secondary | ICD-10-CM | POA: Diagnosis not present

## 2021-08-05 DIAGNOSIS — Z992 Dependence on renal dialysis: Secondary | ICD-10-CM | POA: Diagnosis not present

## 2021-08-05 DIAGNOSIS — D509 Iron deficiency anemia, unspecified: Secondary | ICD-10-CM | POA: Diagnosis not present

## 2021-08-05 DIAGNOSIS — D631 Anemia in chronic kidney disease: Secondary | ICD-10-CM | POA: Diagnosis not present

## 2021-08-05 DIAGNOSIS — N186 End stage renal disease: Secondary | ICD-10-CM | POA: Diagnosis not present

## 2021-08-08 DIAGNOSIS — N186 End stage renal disease: Secondary | ICD-10-CM | POA: Diagnosis not present

## 2021-08-08 DIAGNOSIS — Z992 Dependence on renal dialysis: Secondary | ICD-10-CM | POA: Diagnosis not present

## 2021-08-08 DIAGNOSIS — D631 Anemia in chronic kidney disease: Secondary | ICD-10-CM | POA: Diagnosis not present

## 2021-08-08 DIAGNOSIS — D509 Iron deficiency anemia, unspecified: Secondary | ICD-10-CM | POA: Diagnosis not present

## 2021-08-10 DIAGNOSIS — D509 Iron deficiency anemia, unspecified: Secondary | ICD-10-CM | POA: Diagnosis not present

## 2021-08-10 DIAGNOSIS — Z992 Dependence on renal dialysis: Secondary | ICD-10-CM | POA: Diagnosis not present

## 2021-08-10 DIAGNOSIS — N186 End stage renal disease: Secondary | ICD-10-CM | POA: Diagnosis not present

## 2021-08-10 DIAGNOSIS — D631 Anemia in chronic kidney disease: Secondary | ICD-10-CM | POA: Diagnosis not present

## 2021-08-12 DIAGNOSIS — D631 Anemia in chronic kidney disease: Secondary | ICD-10-CM | POA: Diagnosis not present

## 2021-08-12 DIAGNOSIS — N186 End stage renal disease: Secondary | ICD-10-CM | POA: Diagnosis not present

## 2021-08-12 DIAGNOSIS — D509 Iron deficiency anemia, unspecified: Secondary | ICD-10-CM | POA: Diagnosis not present

## 2021-08-12 DIAGNOSIS — Z992 Dependence on renal dialysis: Secondary | ICD-10-CM | POA: Diagnosis not present

## 2021-08-15 DIAGNOSIS — N186 End stage renal disease: Secondary | ICD-10-CM | POA: Diagnosis not present

## 2021-08-15 DIAGNOSIS — D631 Anemia in chronic kidney disease: Secondary | ICD-10-CM | POA: Diagnosis not present

## 2021-08-15 DIAGNOSIS — D509 Iron deficiency anemia, unspecified: Secondary | ICD-10-CM | POA: Diagnosis not present

## 2021-08-15 DIAGNOSIS — Z992 Dependence on renal dialysis: Secondary | ICD-10-CM | POA: Diagnosis not present

## 2021-08-17 DIAGNOSIS — K921 Melena: Secondary | ICD-10-CM | POA: Diagnosis not present

## 2021-08-17 DIAGNOSIS — D631 Anemia in chronic kidney disease: Secondary | ICD-10-CM | POA: Diagnosis not present

## 2021-08-17 DIAGNOSIS — Z992 Dependence on renal dialysis: Secondary | ICD-10-CM | POA: Diagnosis not present

## 2021-08-17 DIAGNOSIS — K625 Hemorrhage of anus and rectum: Secondary | ICD-10-CM | POA: Diagnosis not present

## 2021-08-17 DIAGNOSIS — N186 End stage renal disease: Secondary | ICD-10-CM | POA: Diagnosis not present

## 2021-08-17 DIAGNOSIS — D509 Iron deficiency anemia, unspecified: Secondary | ICD-10-CM | POA: Diagnosis not present

## 2021-08-17 DIAGNOSIS — K6289 Other specified diseases of anus and rectum: Secondary | ICD-10-CM | POA: Diagnosis not present

## 2021-08-18 DIAGNOSIS — Z992 Dependence on renal dialysis: Secondary | ICD-10-CM | POA: Diagnosis not present

## 2021-08-18 DIAGNOSIS — N186 End stage renal disease: Secondary | ICD-10-CM | POA: Diagnosis not present

## 2021-08-19 DIAGNOSIS — D631 Anemia in chronic kidney disease: Secondary | ICD-10-CM | POA: Diagnosis not present

## 2021-08-19 DIAGNOSIS — Z23 Encounter for immunization: Secondary | ICD-10-CM | POA: Diagnosis not present

## 2021-08-19 DIAGNOSIS — D509 Iron deficiency anemia, unspecified: Secondary | ICD-10-CM | POA: Diagnosis not present

## 2021-08-19 DIAGNOSIS — Z992 Dependence on renal dialysis: Secondary | ICD-10-CM | POA: Diagnosis not present

## 2021-08-19 DIAGNOSIS — N186 End stage renal disease: Secondary | ICD-10-CM | POA: Diagnosis not present

## 2021-08-19 DIAGNOSIS — N2581 Secondary hyperparathyroidism of renal origin: Secondary | ICD-10-CM | POA: Diagnosis not present

## 2021-08-22 DIAGNOSIS — Z23 Encounter for immunization: Secondary | ICD-10-CM | POA: Diagnosis not present

## 2021-08-22 DIAGNOSIS — N2581 Secondary hyperparathyroidism of renal origin: Secondary | ICD-10-CM | POA: Diagnosis not present

## 2021-08-22 DIAGNOSIS — D631 Anemia in chronic kidney disease: Secondary | ICD-10-CM | POA: Diagnosis not present

## 2021-08-22 DIAGNOSIS — D509 Iron deficiency anemia, unspecified: Secondary | ICD-10-CM | POA: Diagnosis not present

## 2021-08-22 DIAGNOSIS — Z992 Dependence on renal dialysis: Secondary | ICD-10-CM | POA: Diagnosis not present

## 2021-08-22 DIAGNOSIS — N186 End stage renal disease: Secondary | ICD-10-CM | POA: Diagnosis not present

## 2021-08-24 DIAGNOSIS — D509 Iron deficiency anemia, unspecified: Secondary | ICD-10-CM | POA: Diagnosis not present

## 2021-08-24 DIAGNOSIS — Z23 Encounter for immunization: Secondary | ICD-10-CM | POA: Diagnosis not present

## 2021-08-24 DIAGNOSIS — D631 Anemia in chronic kidney disease: Secondary | ICD-10-CM | POA: Diagnosis not present

## 2021-08-24 DIAGNOSIS — Z992 Dependence on renal dialysis: Secondary | ICD-10-CM | POA: Diagnosis not present

## 2021-08-24 DIAGNOSIS — N2581 Secondary hyperparathyroidism of renal origin: Secondary | ICD-10-CM | POA: Diagnosis not present

## 2021-08-24 DIAGNOSIS — N186 End stage renal disease: Secondary | ICD-10-CM | POA: Diagnosis not present

## 2021-08-26 DIAGNOSIS — Z992 Dependence on renal dialysis: Secondary | ICD-10-CM | POA: Diagnosis not present

## 2021-08-26 DIAGNOSIS — Z23 Encounter for immunization: Secondary | ICD-10-CM | POA: Diagnosis not present

## 2021-08-26 DIAGNOSIS — N2581 Secondary hyperparathyroidism of renal origin: Secondary | ICD-10-CM | POA: Diagnosis not present

## 2021-08-26 DIAGNOSIS — D631 Anemia in chronic kidney disease: Secondary | ICD-10-CM | POA: Diagnosis not present

## 2021-08-26 DIAGNOSIS — N186 End stage renal disease: Secondary | ICD-10-CM | POA: Diagnosis not present

## 2021-08-26 DIAGNOSIS — D509 Iron deficiency anemia, unspecified: Secondary | ICD-10-CM | POA: Diagnosis not present

## 2021-08-29 DIAGNOSIS — N186 End stage renal disease: Secondary | ICD-10-CM | POA: Diagnosis not present

## 2021-08-29 DIAGNOSIS — N2581 Secondary hyperparathyroidism of renal origin: Secondary | ICD-10-CM | POA: Diagnosis not present

## 2021-08-29 DIAGNOSIS — D509 Iron deficiency anemia, unspecified: Secondary | ICD-10-CM | POA: Diagnosis not present

## 2021-08-29 DIAGNOSIS — Z23 Encounter for immunization: Secondary | ICD-10-CM | POA: Diagnosis not present

## 2021-08-29 DIAGNOSIS — Z992 Dependence on renal dialysis: Secondary | ICD-10-CM | POA: Diagnosis not present

## 2021-08-29 DIAGNOSIS — D631 Anemia in chronic kidney disease: Secondary | ICD-10-CM | POA: Diagnosis not present

## 2021-08-31 DIAGNOSIS — N2581 Secondary hyperparathyroidism of renal origin: Secondary | ICD-10-CM | POA: Diagnosis not present

## 2021-08-31 DIAGNOSIS — Z992 Dependence on renal dialysis: Secondary | ICD-10-CM | POA: Diagnosis not present

## 2021-08-31 DIAGNOSIS — D631 Anemia in chronic kidney disease: Secondary | ICD-10-CM | POA: Diagnosis not present

## 2021-08-31 DIAGNOSIS — Z23 Encounter for immunization: Secondary | ICD-10-CM | POA: Diagnosis not present

## 2021-08-31 DIAGNOSIS — N186 End stage renal disease: Secondary | ICD-10-CM | POA: Diagnosis not present

## 2021-08-31 DIAGNOSIS — D509 Iron deficiency anemia, unspecified: Secondary | ICD-10-CM | POA: Diagnosis not present

## 2021-09-02 DIAGNOSIS — Z992 Dependence on renal dialysis: Secondary | ICD-10-CM | POA: Diagnosis not present

## 2021-09-02 DIAGNOSIS — N2581 Secondary hyperparathyroidism of renal origin: Secondary | ICD-10-CM | POA: Diagnosis not present

## 2021-09-02 DIAGNOSIS — Z23 Encounter for immunization: Secondary | ICD-10-CM | POA: Diagnosis not present

## 2021-09-02 DIAGNOSIS — N186 End stage renal disease: Secondary | ICD-10-CM | POA: Diagnosis not present

## 2021-09-02 DIAGNOSIS — D631 Anemia in chronic kidney disease: Secondary | ICD-10-CM | POA: Diagnosis not present

## 2021-09-02 DIAGNOSIS — D509 Iron deficiency anemia, unspecified: Secondary | ICD-10-CM | POA: Diagnosis not present

## 2021-09-05 DIAGNOSIS — N186 End stage renal disease: Secondary | ICD-10-CM | POA: Diagnosis not present

## 2021-09-05 DIAGNOSIS — Z23 Encounter for immunization: Secondary | ICD-10-CM | POA: Diagnosis not present

## 2021-09-05 DIAGNOSIS — D509 Iron deficiency anemia, unspecified: Secondary | ICD-10-CM | POA: Diagnosis not present

## 2021-09-05 DIAGNOSIS — Z992 Dependence on renal dialysis: Secondary | ICD-10-CM | POA: Diagnosis not present

## 2021-09-05 DIAGNOSIS — D631 Anemia in chronic kidney disease: Secondary | ICD-10-CM | POA: Diagnosis not present

## 2021-09-05 DIAGNOSIS — N2581 Secondary hyperparathyroidism of renal origin: Secondary | ICD-10-CM | POA: Diagnosis not present

## 2021-09-07 DIAGNOSIS — D631 Anemia in chronic kidney disease: Secondary | ICD-10-CM | POA: Diagnosis not present

## 2021-09-07 DIAGNOSIS — N186 End stage renal disease: Secondary | ICD-10-CM | POA: Diagnosis not present

## 2021-09-07 DIAGNOSIS — D509 Iron deficiency anemia, unspecified: Secondary | ICD-10-CM | POA: Diagnosis not present

## 2021-09-07 DIAGNOSIS — Z992 Dependence on renal dialysis: Secondary | ICD-10-CM | POA: Diagnosis not present

## 2021-09-07 DIAGNOSIS — N2581 Secondary hyperparathyroidism of renal origin: Secondary | ICD-10-CM | POA: Diagnosis not present

## 2021-09-07 DIAGNOSIS — Z23 Encounter for immunization: Secondary | ICD-10-CM | POA: Diagnosis not present

## 2021-09-09 DIAGNOSIS — Z23 Encounter for immunization: Secondary | ICD-10-CM | POA: Diagnosis not present

## 2021-09-09 DIAGNOSIS — N2581 Secondary hyperparathyroidism of renal origin: Secondary | ICD-10-CM | POA: Diagnosis not present

## 2021-09-09 DIAGNOSIS — D631 Anemia in chronic kidney disease: Secondary | ICD-10-CM | POA: Diagnosis not present

## 2021-09-09 DIAGNOSIS — Z992 Dependence on renal dialysis: Secondary | ICD-10-CM | POA: Diagnosis not present

## 2021-09-09 DIAGNOSIS — D509 Iron deficiency anemia, unspecified: Secondary | ICD-10-CM | POA: Diagnosis not present

## 2021-09-09 DIAGNOSIS — N186 End stage renal disease: Secondary | ICD-10-CM | POA: Diagnosis not present

## 2021-09-12 DIAGNOSIS — D509 Iron deficiency anemia, unspecified: Secondary | ICD-10-CM | POA: Diagnosis not present

## 2021-09-12 DIAGNOSIS — D631 Anemia in chronic kidney disease: Secondary | ICD-10-CM | POA: Diagnosis not present

## 2021-09-12 DIAGNOSIS — N186 End stage renal disease: Secondary | ICD-10-CM | POA: Diagnosis not present

## 2021-09-12 DIAGNOSIS — Z992 Dependence on renal dialysis: Secondary | ICD-10-CM | POA: Diagnosis not present

## 2021-09-12 DIAGNOSIS — Z23 Encounter for immunization: Secondary | ICD-10-CM | POA: Diagnosis not present

## 2021-09-12 DIAGNOSIS — N2581 Secondary hyperparathyroidism of renal origin: Secondary | ICD-10-CM | POA: Diagnosis not present

## 2021-09-14 DIAGNOSIS — D509 Iron deficiency anemia, unspecified: Secondary | ICD-10-CM | POA: Diagnosis not present

## 2021-09-14 DIAGNOSIS — Z992 Dependence on renal dialysis: Secondary | ICD-10-CM | POA: Diagnosis not present

## 2021-09-14 DIAGNOSIS — D631 Anemia in chronic kidney disease: Secondary | ICD-10-CM | POA: Diagnosis not present

## 2021-09-14 DIAGNOSIS — N2581 Secondary hyperparathyroidism of renal origin: Secondary | ICD-10-CM | POA: Diagnosis not present

## 2021-09-14 DIAGNOSIS — Z23 Encounter for immunization: Secondary | ICD-10-CM | POA: Diagnosis not present

## 2021-09-14 DIAGNOSIS — N186 End stage renal disease: Secondary | ICD-10-CM | POA: Diagnosis not present

## 2021-09-16 DIAGNOSIS — N2581 Secondary hyperparathyroidism of renal origin: Secondary | ICD-10-CM | POA: Diagnosis not present

## 2021-09-16 DIAGNOSIS — D509 Iron deficiency anemia, unspecified: Secondary | ICD-10-CM | POA: Diagnosis not present

## 2021-09-16 DIAGNOSIS — Z992 Dependence on renal dialysis: Secondary | ICD-10-CM | POA: Diagnosis not present

## 2021-09-16 DIAGNOSIS — N186 End stage renal disease: Secondary | ICD-10-CM | POA: Diagnosis not present

## 2021-09-16 DIAGNOSIS — D631 Anemia in chronic kidney disease: Secondary | ICD-10-CM | POA: Diagnosis not present

## 2021-09-16 DIAGNOSIS — Z23 Encounter for immunization: Secondary | ICD-10-CM | POA: Diagnosis not present

## 2021-09-18 DIAGNOSIS — Z992 Dependence on renal dialysis: Secondary | ICD-10-CM | POA: Diagnosis not present

## 2021-09-18 DIAGNOSIS — N186 End stage renal disease: Secondary | ICD-10-CM | POA: Diagnosis not present

## 2021-09-19 DIAGNOSIS — N186 End stage renal disease: Secondary | ICD-10-CM | POA: Diagnosis not present

## 2021-09-19 DIAGNOSIS — N2581 Secondary hyperparathyroidism of renal origin: Secondary | ICD-10-CM | POA: Diagnosis not present

## 2021-09-19 DIAGNOSIS — Z992 Dependence on renal dialysis: Secondary | ICD-10-CM | POA: Diagnosis not present

## 2021-09-19 DIAGNOSIS — D509 Iron deficiency anemia, unspecified: Secondary | ICD-10-CM | POA: Diagnosis not present

## 2021-09-19 DIAGNOSIS — D631 Anemia in chronic kidney disease: Secondary | ICD-10-CM | POA: Diagnosis not present

## 2021-09-21 DIAGNOSIS — D509 Iron deficiency anemia, unspecified: Secondary | ICD-10-CM | POA: Diagnosis not present

## 2021-09-21 DIAGNOSIS — N2581 Secondary hyperparathyroidism of renal origin: Secondary | ICD-10-CM | POA: Diagnosis not present

## 2021-09-21 DIAGNOSIS — Z992 Dependence on renal dialysis: Secondary | ICD-10-CM | POA: Diagnosis not present

## 2021-09-21 DIAGNOSIS — N186 End stage renal disease: Secondary | ICD-10-CM | POA: Diagnosis not present

## 2021-09-21 DIAGNOSIS — D631 Anemia in chronic kidney disease: Secondary | ICD-10-CM | POA: Diagnosis not present

## 2021-09-23 DIAGNOSIS — N2581 Secondary hyperparathyroidism of renal origin: Secondary | ICD-10-CM | POA: Diagnosis not present

## 2021-09-23 DIAGNOSIS — Z992 Dependence on renal dialysis: Secondary | ICD-10-CM | POA: Diagnosis not present

## 2021-09-23 DIAGNOSIS — N186 End stage renal disease: Secondary | ICD-10-CM | POA: Diagnosis not present

## 2021-09-23 DIAGNOSIS — D631 Anemia in chronic kidney disease: Secondary | ICD-10-CM | POA: Diagnosis not present

## 2021-09-23 DIAGNOSIS — D509 Iron deficiency anemia, unspecified: Secondary | ICD-10-CM | POA: Diagnosis not present

## 2021-09-26 DIAGNOSIS — N2581 Secondary hyperparathyroidism of renal origin: Secondary | ICD-10-CM | POA: Diagnosis not present

## 2021-09-26 DIAGNOSIS — D509 Iron deficiency anemia, unspecified: Secondary | ICD-10-CM | POA: Diagnosis not present

## 2021-09-26 DIAGNOSIS — Z992 Dependence on renal dialysis: Secondary | ICD-10-CM | POA: Diagnosis not present

## 2021-09-26 DIAGNOSIS — D631 Anemia in chronic kidney disease: Secondary | ICD-10-CM | POA: Diagnosis not present

## 2021-09-26 DIAGNOSIS — N186 End stage renal disease: Secondary | ICD-10-CM | POA: Diagnosis not present

## 2021-09-28 DIAGNOSIS — D509 Iron deficiency anemia, unspecified: Secondary | ICD-10-CM | POA: Diagnosis not present

## 2021-09-28 DIAGNOSIS — Z992 Dependence on renal dialysis: Secondary | ICD-10-CM | POA: Diagnosis not present

## 2021-09-28 DIAGNOSIS — D631 Anemia in chronic kidney disease: Secondary | ICD-10-CM | POA: Diagnosis not present

## 2021-09-28 DIAGNOSIS — N186 End stage renal disease: Secondary | ICD-10-CM | POA: Diagnosis not present

## 2021-09-28 DIAGNOSIS — N2581 Secondary hyperparathyroidism of renal origin: Secondary | ICD-10-CM | POA: Diagnosis not present

## 2021-09-30 ENCOUNTER — Other Ambulatory Visit: Payer: Self-pay

## 2021-09-30 ENCOUNTER — Encounter (HOSPITAL_COMMUNITY): Payer: Self-pay | Admitting: *Deleted

## 2021-09-30 ENCOUNTER — Emergency Department (HOSPITAL_COMMUNITY): Payer: Medicare Other

## 2021-09-30 ENCOUNTER — Emergency Department (HOSPITAL_COMMUNITY)
Admission: EM | Admit: 2021-09-30 | Discharge: 2021-09-30 | Disposition: A | Discharge status: Home / Self Care | Payer: Medicare Other | Attending: Emergency Medicine | Admitting: Emergency Medicine

## 2021-09-30 DIAGNOSIS — N2581 Secondary hyperparathyroidism of renal origin: Secondary | ICD-10-CM | POA: Diagnosis not present

## 2021-09-30 DIAGNOSIS — M79661 Pain in right lower leg: Secondary | ICD-10-CM | POA: Diagnosis not present

## 2021-09-30 DIAGNOSIS — I2510 Atherosclerotic heart disease of native coronary artery without angina pectoris: Secondary | ICD-10-CM | POA: Diagnosis not present

## 2021-09-30 DIAGNOSIS — D509 Iron deficiency anemia, unspecified: Secondary | ICD-10-CM | POA: Diagnosis not present

## 2021-09-30 DIAGNOSIS — M25461 Effusion, right knee: Secondary | ICD-10-CM | POA: Diagnosis not present

## 2021-09-30 DIAGNOSIS — I132 Hypertensive heart and chronic kidney disease with heart failure and with stage 5 chronic kidney disease, or end stage renal disease: Secondary | ICD-10-CM | POA: Diagnosis not present

## 2021-09-30 DIAGNOSIS — N186 End stage renal disease: Secondary | ICD-10-CM | POA: Diagnosis not present

## 2021-09-30 DIAGNOSIS — M79604 Pain in right leg: Secondary | ICD-10-CM

## 2021-09-30 DIAGNOSIS — Z79899 Other long term (current) drug therapy: Secondary | ICD-10-CM | POA: Insufficient documentation

## 2021-09-30 DIAGNOSIS — Z992 Dependence on renal dialysis: Secondary | ICD-10-CM | POA: Insufficient documentation

## 2021-09-30 DIAGNOSIS — I5022 Chronic systolic (congestive) heart failure: Secondary | ICD-10-CM | POA: Diagnosis not present

## 2021-09-30 DIAGNOSIS — M25561 Pain in right knee: Secondary | ICD-10-CM | POA: Diagnosis present

## 2021-09-30 DIAGNOSIS — D631 Anemia in chronic kidney disease: Secondary | ICD-10-CM | POA: Diagnosis not present

## 2021-09-30 LAB — CBC
HCT: 29.9 % — ABNORMAL LOW (ref 36.0–46.0)
Hemoglobin: 9.5 g/dL — ABNORMAL LOW (ref 12.0–15.0)
MCH: 33.2 pg (ref 26.0–34.0)
MCHC: 31.8 g/dL (ref 30.0–36.0)
MCV: 104.5 fL — ABNORMAL HIGH (ref 80.0–100.0)
Platelets: 204 10*3/uL (ref 150–400)
RBC: 2.86 MIL/uL — ABNORMAL LOW (ref 3.87–5.11)
RDW: 14.9 % (ref 11.5–15.5)
WBC: 8 10*3/uL (ref 4.0–10.5)
nRBC: 0 % (ref 0.0–0.2)

## 2021-09-30 LAB — BASIC METABOLIC PANEL
Anion gap: 9 (ref 5–15)
BUN: 21 mg/dL (ref 8–23)
CO2: 29 mmol/L (ref 22–32)
Calcium: 8 mg/dL — ABNORMAL LOW (ref 8.9–10.3)
Chloride: 95 mmol/L — ABNORMAL LOW (ref 98–111)
Creatinine, Ser: 2.39 mg/dL — ABNORMAL HIGH (ref 0.44–1.00)
GFR, Estimated: 21 mL/min — ABNORMAL LOW (ref >60)
Glucose, Bld: 90 mg/dL (ref 70–99)
Potassium: 3.2 mmol/L — ABNORMAL LOW (ref 3.5–5.1)
Sodium: 133 mmol/L — ABNORMAL LOW (ref 135–145)

## 2021-09-30 MED ORDER — LIDOCAINE HCL (PF) 1 % IJ SOLN
5.0000 mL | Freq: Once | INTRAMUSCULAR | Status: AC
  Administered 2021-09-30: 5 mL via INTRADERMAL
  Filled 2021-09-30: qty 30

## 2021-09-30 NOTE — Discharge Instructions (Addendum)
Please return on Monday for an ultrasound of your Right leg. Contact a health care provider if: You have swelling or stiffness that: Gets worse. Does not get better with medicine. You have a fever or chills. You have any of these signs of infection around your puncture site: More redness, swelling, or pain. Fluid or blood draining. Unusual warmth. Pus or a bad smell. Get help right away if: You have severe pain.

## 2021-09-30 NOTE — ED Triage Notes (Signed)
Bilateral foot and leg pain x 3 days

## 2021-09-30 NOTE — ED Notes (Signed)
Dc instructions reviewed with pt. Pt will call Monday for ultrasound appointment. Pt placed in wheelchair and wheeled to the lobby. Family to transport patient home.

## 2021-09-30 NOTE — ED Provider Notes (Signed)
Medical screening examination/treatment/procedure(s) were conducted as a shared visit with non-physician practitioner(s) and myself.  I personally evaluated the patient during the encounter.  Clinical Impression:   Final diagnoses:  None   Pt has swelling in her R knee for over a month - dialysis access is in the R groin - good thrill, has mild edema of leg compared with left and large soft, minimally tender joint effusion of the knee - mild pain with ROM or the ankle -   Arthrocentesis for therapeutic relief will be performed by physician assistant Kenton Kingfisher,  Will order DVT study to be formed as outpatient, the patient is not tachycardic hypoxic and has no chest pain shortness of breath  The patient and family member are in agreement.   Noemi Chapel, MD 10/01/21 [phone]

## 2021-09-30 NOTE — ED Provider Notes (Signed)
[facility] EMERGENCY DEPARTMENT Provider Note   CSN: [id number] Arrival date & time: 09/30/21  1320     History Chief Complaint  Patient presents with   Leg Pain    [name] is a 70 y.o. female.  The history is provided by the patient. History limited by: poor historian, mumbles, difficult to understand. No language interpreter was used.  Leg Pain Location:  Leg, knee and ankle Time since incident:  1 week Injury: no   Leg location:  R leg and R lower leg Knee location:  R knee Ankle location:  R ankle Pain details:    Quality:  Aching   Radiates to:  Does not radiate   Severity:  Moderate   Onset quality:  Gradual   Duration:  1 week   Timing:  Intermittent (now constant with ambulation) Prior injury to area:  No Relieved by:  Nothing Worsened by:  Bearing weight, activity, flexion and extension Associated symptoms: swelling (swelling of entire R leg)   Associated symptoms: no back pain and no decreased ROM   Associated symptoms comment:  Denies CP/SOB     Past Medical History:  Diagnosis Date   Chronic systolic heart failure (HCC)    Coronary atherosclerosis of native coronary artery    Diseases of tricuspid valve    End stage renal disease (HCC)    dialysis T,Th & Sat   Mitral valve insufficiency and aortic valve insufficiency    Other and unspecified hyperlipidemia    Secondary cardiomyopathy, unspecified    Unspecified essential hypertension     Patient Active Problem List   Diagnosis Date Noted   Labial abscess 06/29/2021   Benign neoplasm of descending colon    Abnormal CT scan, small bowel    Hiatal hernia    Generalized abdominal pain    Melena    Acute blood loss anemia    Perforation bowel ([place]) 03/18/2021   Mesenteric abscess (HCC)    Leukocytosis    Hemodialysis patient ([place]) 12/23/2019   Osteoarthritis 12/23/2019   Dilated gallbladder 07/09/2018   Renal dialysis device, implant, or graft complication 35/57/3220   Septic  arthritis of shoulder, left ([place]) 11/20/2013   Loss of weight 08/04/2013   Red blood cell antibody positive 08/04/2013   Chest pain 07/27/2013   Beta-2-microglobulin amyloidosis ([place]) 07/27/2013   Kidney transplant failure 07/27/2013   Anemia 10/17/2011   Polycystic kidney 10/17/2011   ESRD on dialysis ([place]) 07/26/2010   Hyperlipidemia 08/18/2009   Mitral valve regurgitation 08/18/2009   TRICUSPID REGURGITATION, SEVERE 08/18/2009   Essential hypertension 08/18/2009   Coronary artery disease 08/18/2009   CARDIOMYOPATHY, SECONDARY 25/42/7062   SYSTOLIC HEART FAILURE, CHRONIC 08/18/2009    Past Surgical History:  Procedure Laterality Date   A/V SHUNTOGRAM N/A 08/10/2019   Procedure: A/V SHUNTOGRAM;  Surgeon: Algernon Huxley, MD;  Location: [place] [facility];  Service: Cardiovascular;  Laterality: N/A;   AV FISTULA PLACEMENT Right 2012   AV FISTULA PLACEMENT Left 2004   CATARACT EXTRACTION W/PHACO Left 08/07/2019   Procedure: CATARACT EXTRACTION PHACO AND INTRAOCULAR LENS PLACEMENT LEFT EYE;  Surgeon: [name], MD;  Location: [facility];  Service: Ophthalmology;  Laterality: Left;  left   CATARACT EXTRACTION W/PHACO Right 08/21/2019   Procedure: CATARACT EXTRACTION PHACO AND INTRAOCULAR LENS PLACEMENT RIGHT EYE (CDE: 5.45);  Surgeon: [name], MD;  Location: [facility];  Service: Ophthalmology;  Laterality: Right;   COLONOSCOPY WITH PROPOFOL N/A 03/29/2021   Procedure: COLONOSCOPY WITH PROPOFOL;  Surgeon:  Ladene Artist, MD;  Location: [facility] ENDOSCOPY;  Service: Endoscopy;  Laterality: N/A;   ESOPHAGOGASTRODUODENOSCOPY (EGD) WITH PROPOFOL N/A 03/25/2021   Procedure: ESOPHAGOGASTRODUODENOSCOPY (EGD) WITH PROPOFOL;  Surgeon: Lavena Bullion, DO;  Location: [place];  Service: Gastroenterology;  Laterality: N/A;   INSERTION OF DIALYSIS CATHETER N/A 01/08/2013   Procedure: INSERTION OF DIALYSIS CATHETER;  Surgeon: Angelia Mould, MD;  Location: [place];  Service: Vascular;   Laterality: N/A;   KIDNEY TRANSPLANT Left 2002   pt. states transplant lasted 2 yrs.   PERIPHERAL VASCULAR CATHETERIZATION Right 08/27/2016   Procedure: A/V Shuntogram/Fistulagram;  Surgeon: Algernon Huxley, MD;  Location: [place] [facility];  Service: Cardiovascular;  Laterality: Right;   POLYPECTOMY  03/29/2021   Procedure: POLYPECTOMY;  Surgeon: Ladene Artist, MD;  Location: [facility] ENDOSCOPY;  Service: Endoscopy;;     OB History   No obstetric history on file.     No family history on file.  Social History   Tobacco Use   Smoking status: Never   Smokeless tobacco: Never  Vaping Use   Vaping Use: Never used  Substance Use Topics   Alcohol use: No   Drug use: No    Home Medications Prior to Admission medications   Medication Sig Start Date End Date Taking? Authorizing Provider  acetaminophen (TYLENOL) 500 MG tablet Take 1,000 mg by mouth every 6 (six) hours as needed for pain (headache).    [provider]  B Complex-C-Folic Acid (RENAL) 1 MG CAPS Take 1 mg by mouth daily.    [provider]  cinacalcet (SENSIPAR) 30 MG tablet Take 30 mg by mouth daily after supper.    [provider]  cycloSPORINE (RESTASIS) 0.05 % ophthalmic emulsion Place 1 drop into both eyes 2 (two) times daily.    [provider]  dicyclomine (BENTYL) 10 MG capsule Take 1 capsule (10 mg total) by mouth 3 (three) times daily as needed for spasms (cramping). 03/30/21   Barb Merino, MD  docusate sodium (COLACE) 100 MG capsule Take 100 mg by mouth daily. 10/28/13   [provider]  isosorbide mononitrate (IMDUR) 30 MG 24 hr tablet Take 30 mg by mouth daily.    [provider]  labetalol (NORMODYNE) 200 MG tablet Take 100 mg by mouth 2 (two) times daily. 06/22/20   [provider]  latanoprost (XALATAN) 0.005 % ophthalmic solution Place 1 drop into both eyes at bedtime.    [provider]  minoxidil (LONITEN) 2.5 MG tablet Take 2.5 mg by  mouth 2 (two) times daily.    [provider]  omeprazole (PRILOSEC) 40 MG capsule Take 40 mg by mouth daily. 10/23/17   [provider]  piperacillin-tazobactam (ZOSYN) 2-0.25 GM/50ML IVPB Inject 50 mLs (2.25 g total) into the vein every 8 (eight) hours. 06/29/21   Aline August, MD  sevelamer carbonate (RENVELA) 800 MG tablet Take 800-2,400 mg by mouth 3 (three) times daily with meals. 800 mg with snacks. 2,400 mg with meals    [provider]  simvastatin (ZOCOR) 20 MG tablet Take 20 mg by mouth daily.    [provider]  vancomycin (VANCOCIN) 500-5 MG/100ML-% IVPB Inject 100 mLs (500 mg total) into the vein Every Tuesday,Thursday,and Saturday with dialysis. 06/29/21   Aline August, MD  vitamin B-12 (CYANOCOBALAMIN) 1000 MCG tablet Take 1,000 mcg by mouth daily. 05/26/21   [provider]    Allergies    Dilaudid [hydromorphone] and Tape  Review of Systems  Review of Systems  Musculoskeletal:  Negative for back pain.  Ten systems reviewed and are negative for acute change, except as noted in the HPI.   Physical Exam Updated Vital Signs BP (!) 174/68 (BP Location: Left Arm)   Pulse 64   Temp 97.7 F (36.5 C) (Oral)   Resp 16   SpO2 97%   Physical Exam Vitals and nursing note reviewed.  Constitutional:      General: She is not in acute distress.    Appearance: She is well-developed. She is not diaphoretic.  HENT:     Head: Normocephalic and atraumatic.     Right Ear: External ear normal.     Left Ear: External ear normal.     Nose: Nose normal.     Mouth/Throat:     Mouth: Mucous membranes are moist.  Eyes:     General: No scleral icterus.    Conjunctiva/sclera: Conjunctivae normal.  Cardiovascular:     Rate and Rhythm: Normal rate and regular rhythm.     Heart sounds: Normal heart sounds. No murmur heard.   No friction rub. No gallop.  Pulmonary:     Effort: Pulmonary effort is normal. No respiratory distress.     Breath  sounds: Normal breath sounds.  Abdominal:     General: Bowel sounds are normal. There is no distension.     Palpations: Abdomen is soft. There is no mass.     Tenderness: There is no abdominal tenderness. There is no guarding.  Musculoskeletal:     Cervical back: Normal range of motion.     Comments: Pain with passive and active ROM of the r ankle and knee. Global swelling of the R leg as compared to the left DP/PT pulses bounding BL  Skin:    General: Skin is warm and dry.  Neurological:     Mental Status: She is alert and oriented to person, place, and time.  Psychiatric:        Behavior: Behavior normal.    ED Results / Procedures / Treatments   Labs (all labs ordered are listed, but only abnormal results are displayed) Labs Reviewed - No data to display  EKG None  Radiology No results found.  Procedures .Joint Aspiration/Arthrocentesis  Date/Time: 09/30/2021 6:12 PM Performed by: Margarita Mail, PA-C Authorized by: Margarita Mail, PA-C   Consent:    Consent obtained:  Verbal   Consent given by:  Patient   Risks discussed:  Bleeding, infection, pain and incomplete drainage   Alternatives discussed:  No treatment Universal protocol:    Patient identity confirmed:  Arm band Location:    Location:  Knee   Knee:  R knee Anesthesia:    Anesthesia method:  Local infiltration   Local anesthetic:  Lidocaine 1% w/o epi Procedure details:    Needle gauge:  18 G   Ultrasound guidance: no     Approach:  Superior   Aspirate amount:  35 ml   Aspirate characteristics:  Clear   Steroid injected: no     Specimen collected: no   Post-procedure details:    Dressing:  Adhesive bandage   Procedure completion:  Tolerated well, no immediate complications   Medications Ordered in ED Medications - No data to display  ED Course  I have reviewed the triage vital signs and the nursing notes.  Pertinent labs & imaging results that were available during my care of the  patient were reviewed by me and considered in my medical decision making (see chart  for details).    MDM Rules/Calculators/A&P                           Patient here with complaint of leg pain.  Differential diagnosis includes trauma, injury, gout, knee effusion, septic arthritis, DVT.  I ordered and reviewed labs include CBC with mild normocytic anemia, BMP which shows elevated creatinine consistent with history of end-stage renal disease.  Patient has been on dialysis for 22 years and is compliant with therapy.  I ordered and reviewed images of a right knee and ankle X x-ray which shows a large joint effusion on the right knee and soft tissue edema in the right ankle. Patient given arthrocentesis to pull off joint fluid for comfort with improvement in her pain.  She may take Tylenol at home.  Follow-up with PCP.  Discussed return precautions.  Follow-up on Monday for DVT evaluation. Final Clinical Impression(s) / ED Diagnoses Final diagnoses:  None    Rx / DC Orders ED Discharge Orders     None        Margarita Mail, PA-C 09/30/21 1819    Noemi Chapel, MD 10/01/21 [phone]

## 2021-10-03 DIAGNOSIS — D509 Iron deficiency anemia, unspecified: Secondary | ICD-10-CM | POA: Diagnosis not present

## 2021-10-03 DIAGNOSIS — Z992 Dependence on renal dialysis: Secondary | ICD-10-CM | POA: Diagnosis not present

## 2021-10-03 DIAGNOSIS — D631 Anemia in chronic kidney disease: Secondary | ICD-10-CM | POA: Diagnosis not present

## 2021-10-03 DIAGNOSIS — N2581 Secondary hyperparathyroidism of renal origin: Secondary | ICD-10-CM | POA: Diagnosis not present

## 2021-10-03 DIAGNOSIS — N186 End stage renal disease: Secondary | ICD-10-CM | POA: Diagnosis not present

## 2021-10-05 DIAGNOSIS — Z992 Dependence on renal dialysis: Secondary | ICD-10-CM | POA: Diagnosis not present

## 2021-10-05 DIAGNOSIS — D631 Anemia in chronic kidney disease: Secondary | ICD-10-CM | POA: Diagnosis not present

## 2021-10-05 DIAGNOSIS — D509 Iron deficiency anemia, unspecified: Secondary | ICD-10-CM | POA: Diagnosis not present

## 2021-10-05 DIAGNOSIS — N2581 Secondary hyperparathyroidism of renal origin: Secondary | ICD-10-CM | POA: Diagnosis not present

## 2021-10-05 DIAGNOSIS — N186 End stage renal disease: Secondary | ICD-10-CM | POA: Diagnosis not present

## 2021-10-07 DIAGNOSIS — Z992 Dependence on renal dialysis: Secondary | ICD-10-CM | POA: Diagnosis not present

## 2021-10-07 DIAGNOSIS — D631 Anemia in chronic kidney disease: Secondary | ICD-10-CM | POA: Diagnosis not present

## 2021-10-07 DIAGNOSIS — N186 End stage renal disease: Secondary | ICD-10-CM | POA: Diagnosis not present

## 2021-10-07 DIAGNOSIS — D509 Iron deficiency anemia, unspecified: Secondary | ICD-10-CM | POA: Diagnosis not present

## 2021-10-07 DIAGNOSIS — N2581 Secondary hyperparathyroidism of renal origin: Secondary | ICD-10-CM | POA: Diagnosis not present

## 2021-10-09 DIAGNOSIS — M1990 Unspecified osteoarthritis, unspecified site: Secondary | ICD-10-CM | POA: Diagnosis not present

## 2021-10-09 DIAGNOSIS — Z299 Encounter for prophylactic measures, unspecified: Secondary | ICD-10-CM | POA: Diagnosis not present

## 2021-10-09 DIAGNOSIS — N186 End stage renal disease: Secondary | ICD-10-CM | POA: Diagnosis not present

## 2021-10-09 DIAGNOSIS — I270 Primary pulmonary hypertension: Secondary | ICD-10-CM | POA: Diagnosis not present

## 2021-10-09 DIAGNOSIS — Z992 Dependence on renal dialysis: Secondary | ICD-10-CM | POA: Diagnosis not present

## 2021-10-10 DIAGNOSIS — D631 Anemia in chronic kidney disease: Secondary | ICD-10-CM | POA: Diagnosis not present

## 2021-10-10 DIAGNOSIS — D509 Iron deficiency anemia, unspecified: Secondary | ICD-10-CM | POA: Diagnosis not present

## 2021-10-10 DIAGNOSIS — N186 End stage renal disease: Secondary | ICD-10-CM | POA: Diagnosis not present

## 2021-10-10 DIAGNOSIS — N2581 Secondary hyperparathyroidism of renal origin: Secondary | ICD-10-CM | POA: Diagnosis not present

## 2021-10-10 DIAGNOSIS — Z992 Dependence on renal dialysis: Secondary | ICD-10-CM | POA: Diagnosis not present

## 2021-10-12 DIAGNOSIS — N186 End stage renal disease: Secondary | ICD-10-CM | POA: Diagnosis not present

## 2021-10-12 DIAGNOSIS — D509 Iron deficiency anemia, unspecified: Secondary | ICD-10-CM | POA: Diagnosis not present

## 2021-10-12 DIAGNOSIS — N2581 Secondary hyperparathyroidism of renal origin: Secondary | ICD-10-CM | POA: Diagnosis not present

## 2021-10-12 DIAGNOSIS — Z992 Dependence on renal dialysis: Secondary | ICD-10-CM | POA: Diagnosis not present

## 2021-10-12 DIAGNOSIS — D631 Anemia in chronic kidney disease: Secondary | ICD-10-CM | POA: Diagnosis not present

## 2021-10-14 DIAGNOSIS — D509 Iron deficiency anemia, unspecified: Secondary | ICD-10-CM | POA: Diagnosis not present

## 2021-10-14 DIAGNOSIS — N2581 Secondary hyperparathyroidism of renal origin: Secondary | ICD-10-CM | POA: Diagnosis not present

## 2021-10-14 DIAGNOSIS — Z992 Dependence on renal dialysis: Secondary | ICD-10-CM | POA: Diagnosis not present

## 2021-10-14 DIAGNOSIS — D631 Anemia in chronic kidney disease: Secondary | ICD-10-CM | POA: Diagnosis not present

## 2021-10-14 DIAGNOSIS — N186 End stage renal disease: Secondary | ICD-10-CM | POA: Diagnosis not present

## 2021-10-17 DIAGNOSIS — N186 End stage renal disease: Secondary | ICD-10-CM | POA: Diagnosis not present

## 2021-10-17 DIAGNOSIS — N2581 Secondary hyperparathyroidism of renal origin: Secondary | ICD-10-CM | POA: Diagnosis not present

## 2021-10-17 DIAGNOSIS — Z992 Dependence on renal dialysis: Secondary | ICD-10-CM | POA: Diagnosis not present

## 2021-10-17 DIAGNOSIS — D509 Iron deficiency anemia, unspecified: Secondary | ICD-10-CM | POA: Diagnosis not present

## 2021-10-17 DIAGNOSIS — D631 Anemia in chronic kidney disease: Secondary | ICD-10-CM | POA: Diagnosis not present

## 2021-10-18 DIAGNOSIS — N186 End stage renal disease: Secondary | ICD-10-CM | POA: Diagnosis not present

## 2021-10-18 DIAGNOSIS — Z992 Dependence on renal dialysis: Secondary | ICD-10-CM | POA: Diagnosis not present

## 2021-10-19 DIAGNOSIS — D509 Iron deficiency anemia, unspecified: Secondary | ICD-10-CM | POA: Diagnosis not present

## 2021-10-19 DIAGNOSIS — D631 Anemia in chronic kidney disease: Secondary | ICD-10-CM | POA: Diagnosis not present

## 2021-10-19 DIAGNOSIS — Z992 Dependence on renal dialysis: Secondary | ICD-10-CM | POA: Diagnosis not present

## 2021-10-19 DIAGNOSIS — N2581 Secondary hyperparathyroidism of renal origin: Secondary | ICD-10-CM | POA: Diagnosis not present

## 2021-10-19 DIAGNOSIS — N186 End stage renal disease: Secondary | ICD-10-CM | POA: Diagnosis not present

## 2021-10-21 DIAGNOSIS — N2581 Secondary hyperparathyroidism of renal origin: Secondary | ICD-10-CM | POA: Diagnosis not present

## 2021-10-21 DIAGNOSIS — D509 Iron deficiency anemia, unspecified: Secondary | ICD-10-CM | POA: Diagnosis not present

## 2021-10-21 DIAGNOSIS — N186 End stage renal disease: Secondary | ICD-10-CM | POA: Diagnosis not present

## 2021-10-21 DIAGNOSIS — D631 Anemia in chronic kidney disease: Secondary | ICD-10-CM | POA: Diagnosis not present

## 2021-10-21 DIAGNOSIS — Z992 Dependence on renal dialysis: Secondary | ICD-10-CM | POA: Diagnosis not present

## 2021-10-24 DIAGNOSIS — D631 Anemia in chronic kidney disease: Secondary | ICD-10-CM | POA: Diagnosis not present

## 2021-10-24 DIAGNOSIS — Z992 Dependence on renal dialysis: Secondary | ICD-10-CM | POA: Diagnosis not present

## 2021-10-24 DIAGNOSIS — N186 End stage renal disease: Secondary | ICD-10-CM | POA: Diagnosis not present

## 2021-10-24 DIAGNOSIS — D509 Iron deficiency anemia, unspecified: Secondary | ICD-10-CM | POA: Diagnosis not present

## 2021-10-24 DIAGNOSIS — N2581 Secondary hyperparathyroidism of renal origin: Secondary | ICD-10-CM | POA: Diagnosis not present

## 2021-10-26 DIAGNOSIS — Z992 Dependence on renal dialysis: Secondary | ICD-10-CM | POA: Diagnosis not present

## 2021-10-26 DIAGNOSIS — N186 End stage renal disease: Secondary | ICD-10-CM | POA: Diagnosis not present

## 2021-10-26 DIAGNOSIS — D509 Iron deficiency anemia, unspecified: Secondary | ICD-10-CM | POA: Diagnosis not present

## 2021-10-26 DIAGNOSIS — N2581 Secondary hyperparathyroidism of renal origin: Secondary | ICD-10-CM | POA: Diagnosis not present

## 2021-10-26 DIAGNOSIS — D631 Anemia in chronic kidney disease: Secondary | ICD-10-CM | POA: Diagnosis not present

## 2021-10-28 DIAGNOSIS — D509 Iron deficiency anemia, unspecified: Secondary | ICD-10-CM | POA: Diagnosis not present

## 2021-10-28 DIAGNOSIS — D631 Anemia in chronic kidney disease: Secondary | ICD-10-CM | POA: Diagnosis not present

## 2021-10-28 DIAGNOSIS — Z992 Dependence on renal dialysis: Secondary | ICD-10-CM | POA: Diagnosis not present

## 2021-10-28 DIAGNOSIS — N2581 Secondary hyperparathyroidism of renal origin: Secondary | ICD-10-CM | POA: Diagnosis not present

## 2021-10-28 DIAGNOSIS — N186 End stage renal disease: Secondary | ICD-10-CM | POA: Diagnosis not present

## 2021-10-30 DIAGNOSIS — Z789 Other specified health status: Secondary | ICD-10-CM | POA: Diagnosis not present

## 2021-10-30 DIAGNOSIS — I270 Primary pulmonary hypertension: Secondary | ICD-10-CM | POA: Diagnosis not present

## 2021-10-30 DIAGNOSIS — Z299 Encounter for prophylactic measures, unspecified: Secondary | ICD-10-CM | POA: Diagnosis not present

## 2021-10-30 DIAGNOSIS — I10 Essential (primary) hypertension: Secondary | ICD-10-CM | POA: Diagnosis not present

## 2021-10-30 DIAGNOSIS — K921 Melena: Secondary | ICD-10-CM | POA: Diagnosis not present

## 2021-10-30 DIAGNOSIS — M25461 Effusion, right knee: Secondary | ICD-10-CM | POA: Diagnosis not present

## 2021-10-31 DIAGNOSIS — Z992 Dependence on renal dialysis: Secondary | ICD-10-CM | POA: Diagnosis not present

## 2021-10-31 DIAGNOSIS — D509 Iron deficiency anemia, unspecified: Secondary | ICD-10-CM | POA: Diagnosis not present

## 2021-10-31 DIAGNOSIS — N186 End stage renal disease: Secondary | ICD-10-CM | POA: Diagnosis not present

## 2021-10-31 DIAGNOSIS — D631 Anemia in chronic kidney disease: Secondary | ICD-10-CM | POA: Diagnosis not present

## 2021-10-31 DIAGNOSIS — N2581 Secondary hyperparathyroidism of renal origin: Secondary | ICD-10-CM | POA: Diagnosis not present

## 2021-11-02 DIAGNOSIS — D509 Iron deficiency anemia, unspecified: Secondary | ICD-10-CM | POA: Diagnosis not present

## 2021-11-02 DIAGNOSIS — D631 Anemia in chronic kidney disease: Secondary | ICD-10-CM | POA: Diagnosis not present

## 2021-11-02 DIAGNOSIS — Z992 Dependence on renal dialysis: Secondary | ICD-10-CM | POA: Diagnosis not present

## 2021-11-02 DIAGNOSIS — N2581 Secondary hyperparathyroidism of renal origin: Secondary | ICD-10-CM | POA: Diagnosis not present

## 2021-11-02 DIAGNOSIS — N186 End stage renal disease: Secondary | ICD-10-CM | POA: Diagnosis not present

## 2021-11-04 DIAGNOSIS — N2581 Secondary hyperparathyroidism of renal origin: Secondary | ICD-10-CM | POA: Diagnosis not present

## 2021-11-04 DIAGNOSIS — D509 Iron deficiency anemia, unspecified: Secondary | ICD-10-CM | POA: Diagnosis not present

## 2021-11-04 DIAGNOSIS — N186 End stage renal disease: Secondary | ICD-10-CM | POA: Diagnosis not present

## 2021-11-04 DIAGNOSIS — D631 Anemia in chronic kidney disease: Secondary | ICD-10-CM | POA: Diagnosis not present

## 2021-11-04 DIAGNOSIS — Z992 Dependence on renal dialysis: Secondary | ICD-10-CM | POA: Diagnosis not present

## 2021-11-07 DIAGNOSIS — D509 Iron deficiency anemia, unspecified: Secondary | ICD-10-CM | POA: Diagnosis not present

## 2021-11-07 DIAGNOSIS — Z992 Dependence on renal dialysis: Secondary | ICD-10-CM | POA: Diagnosis not present

## 2021-11-07 DIAGNOSIS — N2581 Secondary hyperparathyroidism of renal origin: Secondary | ICD-10-CM | POA: Diagnosis not present

## 2021-11-07 DIAGNOSIS — D631 Anemia in chronic kidney disease: Secondary | ICD-10-CM | POA: Diagnosis not present

## 2021-11-07 DIAGNOSIS — N186 End stage renal disease: Secondary | ICD-10-CM | POA: Diagnosis not present

## 2021-11-09 DIAGNOSIS — D509 Iron deficiency anemia, unspecified: Secondary | ICD-10-CM | POA: Diagnosis not present

## 2021-11-09 DIAGNOSIS — Z992 Dependence on renal dialysis: Secondary | ICD-10-CM | POA: Diagnosis not present

## 2021-11-09 DIAGNOSIS — D631 Anemia in chronic kidney disease: Secondary | ICD-10-CM | POA: Diagnosis not present

## 2021-11-09 DIAGNOSIS — N186 End stage renal disease: Secondary | ICD-10-CM | POA: Diagnosis not present

## 2021-11-09 DIAGNOSIS — N2581 Secondary hyperparathyroidism of renal origin: Secondary | ICD-10-CM | POA: Diagnosis not present

## 2021-11-11 DIAGNOSIS — D631 Anemia in chronic kidney disease: Secondary | ICD-10-CM | POA: Diagnosis not present

## 2021-11-11 DIAGNOSIS — N186 End stage renal disease: Secondary | ICD-10-CM | POA: Diagnosis not present

## 2021-11-11 DIAGNOSIS — D509 Iron deficiency anemia, unspecified: Secondary | ICD-10-CM | POA: Diagnosis not present

## 2021-11-11 DIAGNOSIS — N2581 Secondary hyperparathyroidism of renal origin: Secondary | ICD-10-CM | POA: Diagnosis not present

## 2021-11-11 DIAGNOSIS — Z992 Dependence on renal dialysis: Secondary | ICD-10-CM | POA: Diagnosis not present

## 2021-11-13 DIAGNOSIS — N186 End stage renal disease: Secondary | ICD-10-CM | POA: Diagnosis not present

## 2021-11-13 DIAGNOSIS — Z992 Dependence on renal dialysis: Secondary | ICD-10-CM | POA: Diagnosis not present

## 2021-11-14 DIAGNOSIS — D509 Iron deficiency anemia, unspecified: Secondary | ICD-10-CM | POA: Diagnosis not present

## 2021-11-14 DIAGNOSIS — D631 Anemia in chronic kidney disease: Secondary | ICD-10-CM | POA: Diagnosis not present

## 2021-11-14 DIAGNOSIS — N2581 Secondary hyperparathyroidism of renal origin: Secondary | ICD-10-CM | POA: Diagnosis not present

## 2021-11-14 DIAGNOSIS — E785 Hyperlipidemia, unspecified: Secondary | ICD-10-CM | POA: Diagnosis not present

## 2021-11-14 DIAGNOSIS — Z992 Dependence on renal dialysis: Secondary | ICD-10-CM | POA: Diagnosis not present

## 2021-11-14 DIAGNOSIS — N186 End stage renal disease: Secondary | ICD-10-CM | POA: Diagnosis not present

## 2021-11-19 DEATH — deceased

## 2023-08-29 IMAGING — DX DG ANKLE COMPLETE 3+V*R*
3 series · 3 of 3 positions shown · non-contrast
Comparison: None.

CLINICAL DATA: Pain and swelling.

EXAM:
RIGHT KNEE - COMPLETE 4+ VIEW; RIGHT ANKLE - COMPLETE 3+ VIEW

[ankle ap]
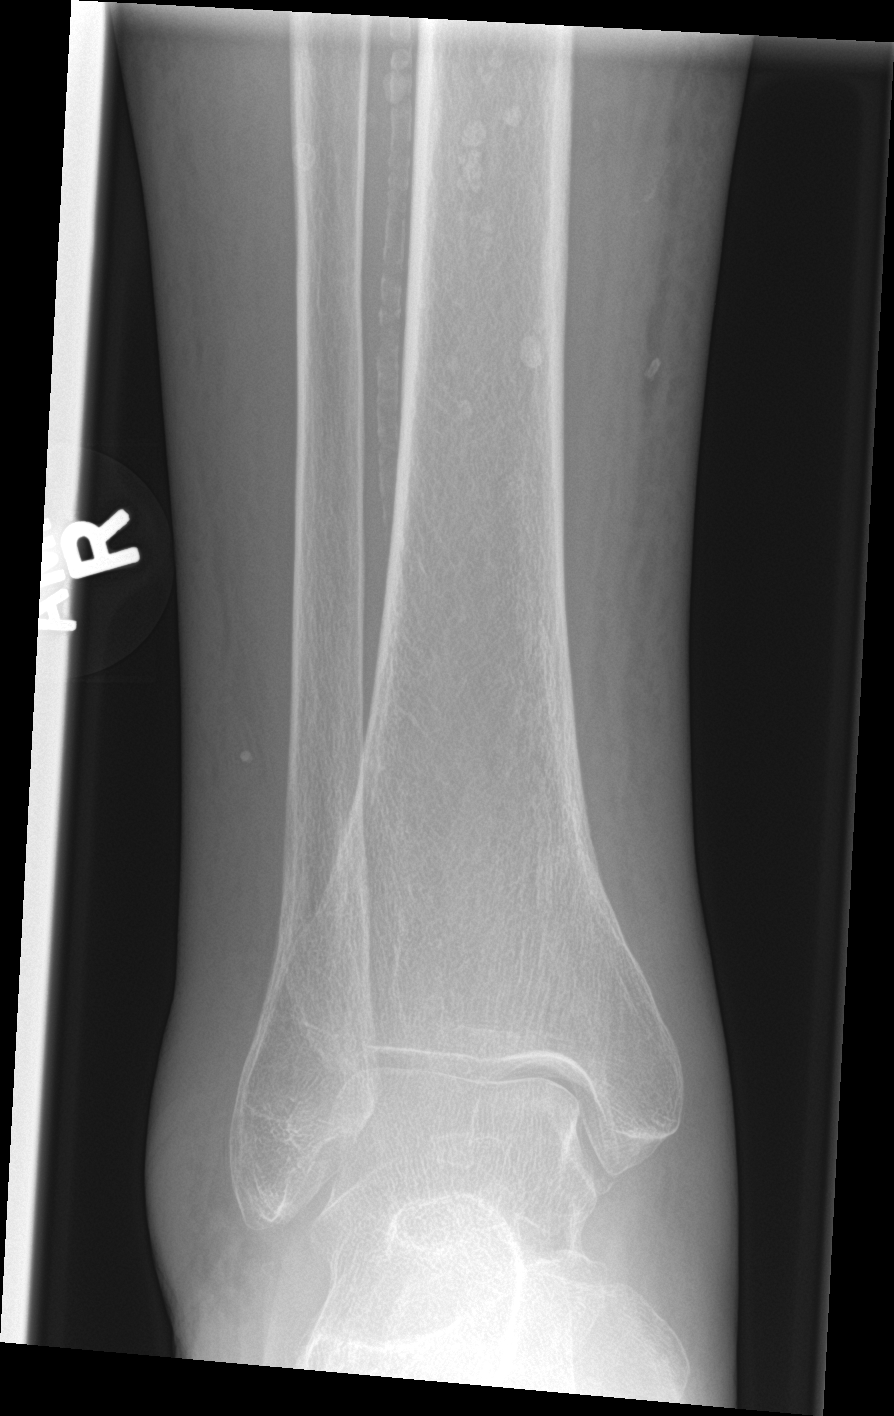

[ankle obl]
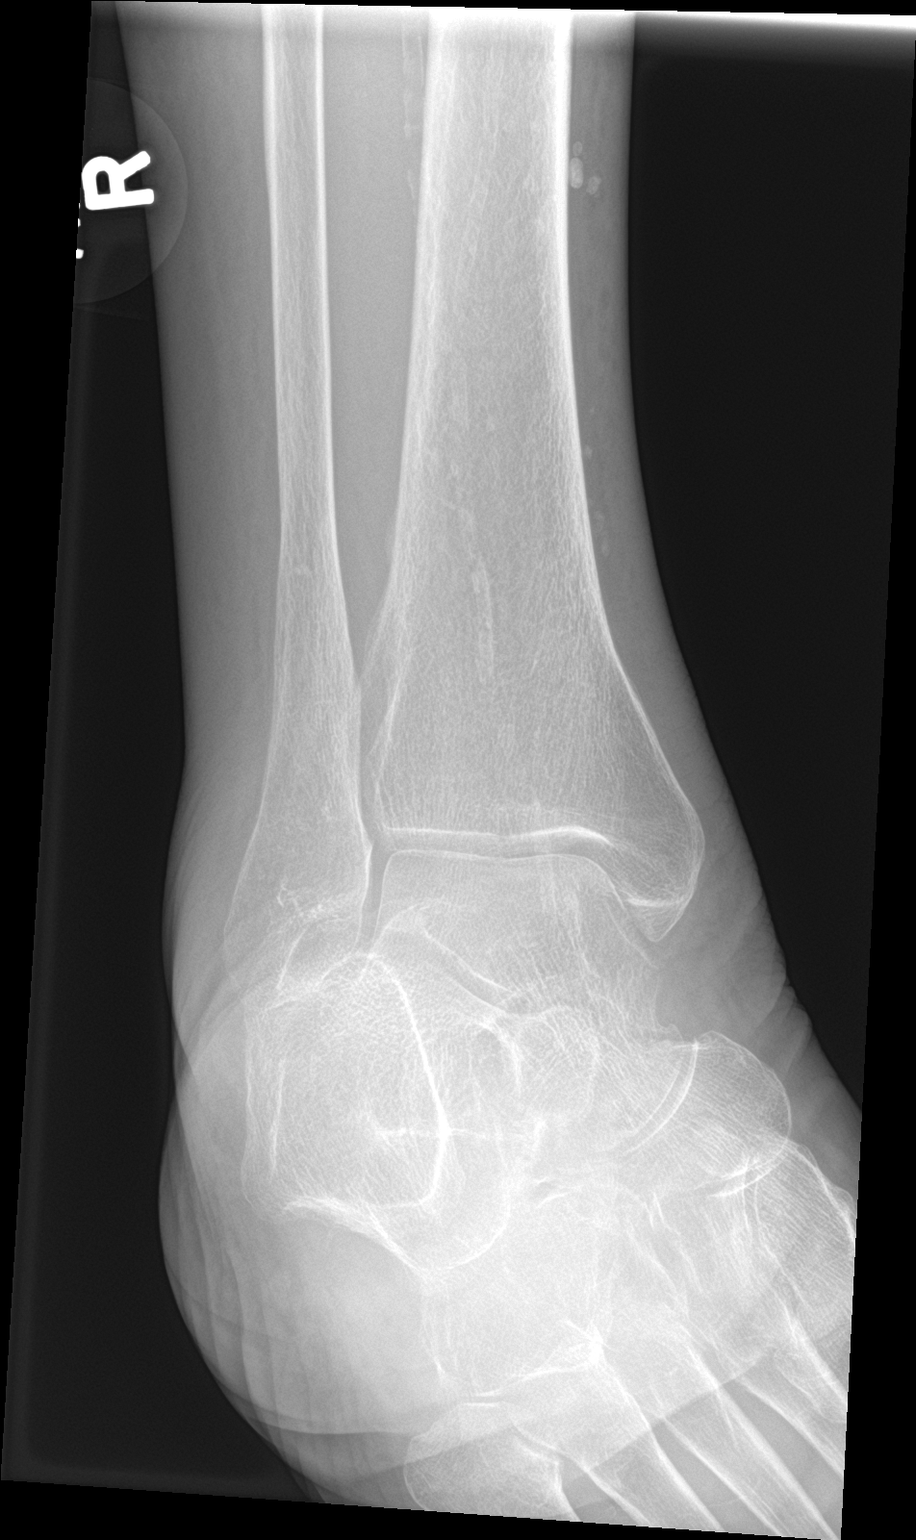

[ankle lat]
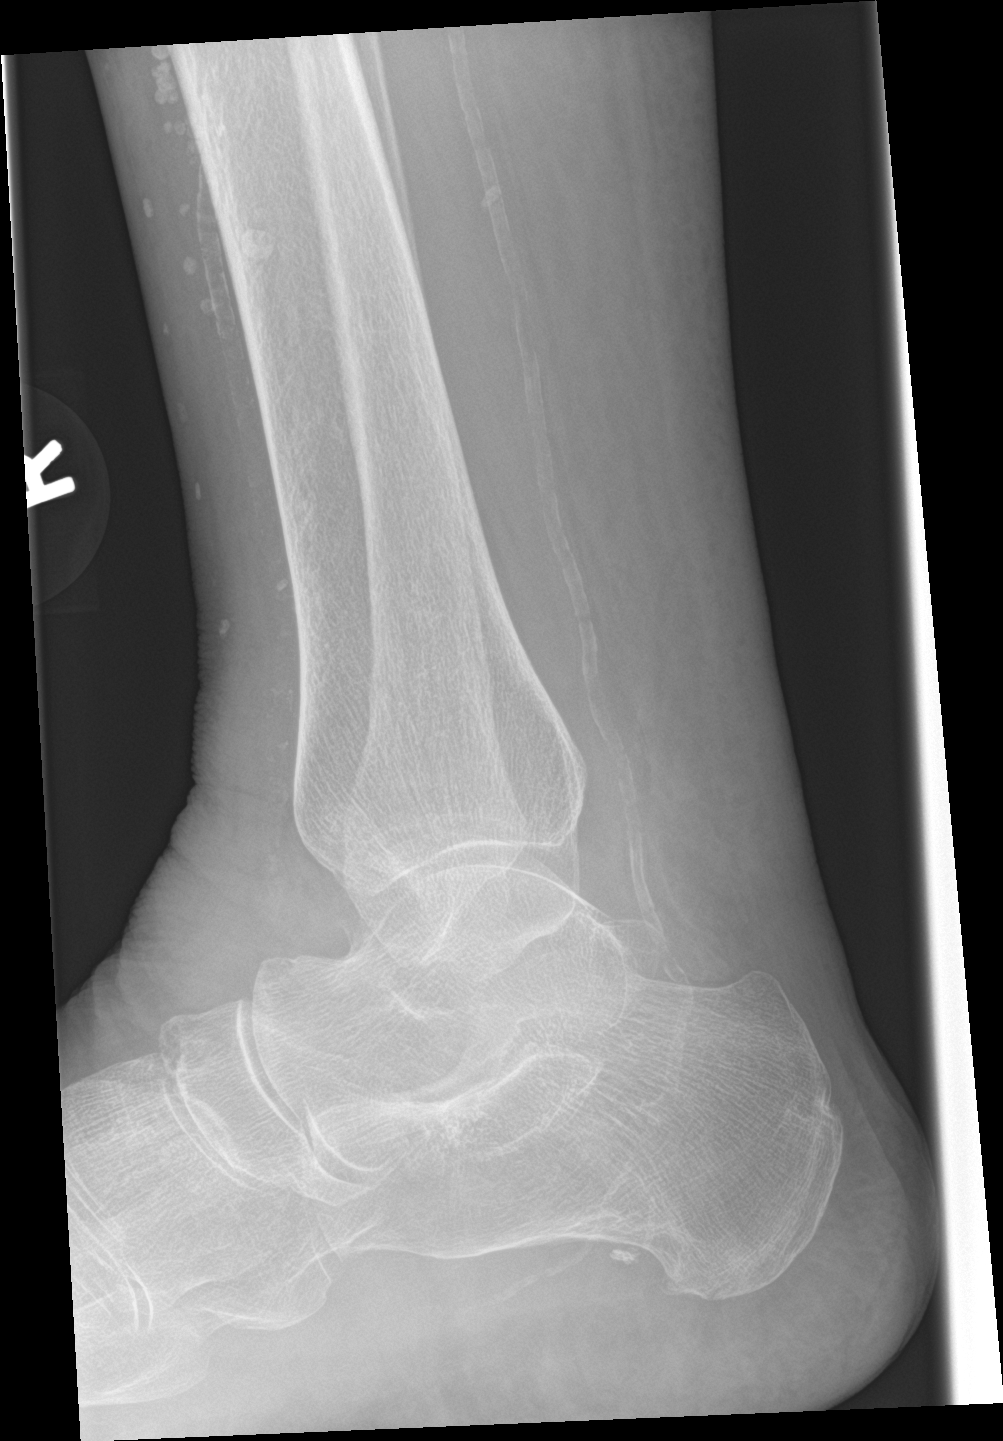

[3 of 3 positions shown; findings below may reference images not displayed]

FINDINGS: Right ankle: The bones are osteopenic. There is no acute fracture or
dislocation. There is a well corticated density adjacent to the tip
of the medial malleolus likely related to old injury. Joint spaces
appear well maintained. There is diffuse soft tissue swelling of the
ankle. Peripheral vascular calcifications are present.

Right knee: The bones are diffusely osteopenic. There is a large
suprapatellar joint effusion. There is diffuse soft tissue swelling
of the knee.

There is no acute fracture or dislocation identified. The joint
spaces are well maintained. Mild degenerative osteophytes are seen
throughout the knee.
IMPRESSION: 1. Large right knee joint effusion.
2. Diffuse soft tissue swelling of the right knee and right ankle.
3. No acute fracture or dislocation.
4. Mild degenerative changes of the right knee.
